# Patient Record
Sex: Female | Born: 1940 | Race: White | Hispanic: No | Smoking: Never smoker
Health system: Southern US, Community
[De-identification: ages and names within clinical notes are randomized; demographics above are authoritative.]

## PROBLEM LIST (undated history)

## (undated) DIAGNOSIS — E785 Hyperlipidemia, unspecified: Secondary | ICD-10-CM

## (undated) DIAGNOSIS — I4891 Unspecified atrial fibrillation: Secondary | ICD-10-CM

## (undated) DIAGNOSIS — I639 Cerebral infarction, unspecified: Secondary | ICD-10-CM

## (undated) DIAGNOSIS — E039 Hypothyroidism, unspecified: Secondary | ICD-10-CM

## (undated) DIAGNOSIS — J189 Pneumonia, unspecified organism: Secondary | ICD-10-CM

## (undated) DIAGNOSIS — K2970 Gastritis, unspecified, without bleeding: Secondary | ICD-10-CM

## (undated) DIAGNOSIS — M069 Rheumatoid arthritis, unspecified: Secondary | ICD-10-CM

## (undated) DIAGNOSIS — K317 Polyp of stomach and duodenum: Secondary | ICD-10-CM

## (undated) DIAGNOSIS — E079 Disorder of thyroid, unspecified: Secondary | ICD-10-CM

## (undated) DIAGNOSIS — F419 Anxiety disorder, unspecified: Secondary | ICD-10-CM

## (undated) DIAGNOSIS — F0390 Unspecified dementia without behavioral disturbance: Secondary | ICD-10-CM

## (undated) DIAGNOSIS — K219 Gastro-esophageal reflux disease without esophagitis: Secondary | ICD-10-CM

## (undated) DIAGNOSIS — K449 Diaphragmatic hernia without obstruction or gangrene: Secondary | ICD-10-CM

## (undated) DIAGNOSIS — I10 Essential (primary) hypertension: Secondary | ICD-10-CM

## (undated) DIAGNOSIS — M1990 Unspecified osteoarthritis, unspecified site: Secondary | ICD-10-CM

## (undated) DIAGNOSIS — K222 Esophageal obstruction: Secondary | ICD-10-CM

## (undated) HISTORY — PX: BLEPHAROPLASTY: SUR158

## (undated) HISTORY — DX: Diaphragmatic hernia without obstruction or gangrene: K44.9

## (undated) HISTORY — DX: Unspecified atrial fibrillation: I48.91

## (undated) HISTORY — DX: Gastritis, unspecified, without bleeding: K29.70

## (undated) HISTORY — PX: CATARACT EXTRACTION: SUR2

## (undated) HISTORY — PX: TUBAL LIGATION: SHX77

## (undated) HISTORY — PX: CHOLECYSTECTOMY: SHX55

## (undated) HISTORY — DX: Anxiety disorder, unspecified: F41.9

## (undated) HISTORY — DX: Unspecified osteoarthritis, unspecified site: M19.90

## (undated) HISTORY — DX: Gastro-esophageal reflux disease without esophagitis: K21.9

## (undated) HISTORY — DX: Polyp of stomach and duodenum: K31.7

## (undated) HISTORY — PX: APPENDECTOMY: SHX54

## (undated) HISTORY — DX: Disorder of thyroid, unspecified: E07.9

## (undated) HISTORY — DX: Hyperlipidemia, unspecified: E78.5

## (undated) HISTORY — DX: Rheumatoid arthritis, unspecified: M06.9

## (undated) HISTORY — DX: Unspecified dementia, unspecified severity, without behavioral disturbance, psychotic disturbance, mood disturbance, and anxiety: F03.90

## (undated) HISTORY — DX: Esophageal obstruction: K22.2

---

## 1999-03-05 ENCOUNTER — Other Ambulatory Visit: Admission: RE | Admit: 1999-03-05 | Discharge: 1999-03-05 | Payer: Self-pay

## 2000-03-16 ENCOUNTER — Other Ambulatory Visit: Admission: RE | Admit: 2000-03-16 | Discharge: 2000-03-16 | Payer: Self-pay | Admitting: Family Medicine

## 2001-05-16 ENCOUNTER — Other Ambulatory Visit: Admission: RE | Admit: 2001-05-16 | Discharge: 2001-05-16 | Payer: Self-pay | Admitting: Family Medicine

## 2003-01-08 ENCOUNTER — Other Ambulatory Visit: Admission: RE | Admit: 2003-01-08 | Discharge: 2003-01-08 | Payer: Self-pay | Admitting: *Deleted

## 2004-04-08 ENCOUNTER — Ambulatory Visit (HOSPITAL_COMMUNITY): Admission: RE | Admit: 2004-04-08 | Discharge: 2004-04-08 | Payer: Self-pay | Admitting: Gastroenterology

## 2004-06-10 ENCOUNTER — Ambulatory Visit: Payer: Self-pay | Admitting: Cardiology

## 2004-07-28 ENCOUNTER — Ambulatory Visit: Payer: Self-pay | Admitting: Gastroenterology

## 2004-07-28 ENCOUNTER — Ambulatory Visit (HOSPITAL_COMMUNITY): Admission: RE | Admit: 2004-07-28 | Discharge: 2004-07-28 | Payer: Self-pay | Admitting: Gastroenterology

## 2005-08-03 ENCOUNTER — Other Ambulatory Visit: Admission: RE | Admit: 2005-08-03 | Discharge: 2005-08-03 | Payer: Self-pay | Admitting: Family Medicine

## 2005-08-04 ENCOUNTER — Ambulatory Visit: Payer: Self-pay | Admitting: Cardiology

## 2005-10-04 ENCOUNTER — Encounter: Admission: RE | Admit: 2005-10-04 | Discharge: 2005-11-01 | Payer: Self-pay | Admitting: Rheumatology

## 2005-11-02 ENCOUNTER — Encounter: Admission: RE | Admit: 2005-11-02 | Discharge: 2005-12-03 | Payer: Self-pay | Admitting: Rheumatology

## 2006-04-13 ENCOUNTER — Encounter: Admission: RE | Admit: 2006-04-13 | Discharge: 2006-06-08 | Payer: Self-pay | Admitting: Rheumatology

## 2006-10-04 ENCOUNTER — Inpatient Hospital Stay (HOSPITAL_COMMUNITY): Admission: EM | Admit: 2006-10-04 | Discharge: 2006-10-05 | Payer: Self-pay | Admitting: Emergency Medicine

## 2006-10-04 ENCOUNTER — Ambulatory Visit: Payer: Self-pay | Admitting: Cardiology

## 2006-10-05 ENCOUNTER — Encounter: Payer: Self-pay | Admitting: Cardiology

## 2006-10-10 ENCOUNTER — Ambulatory Visit: Payer: Self-pay | Admitting: Cardiology

## 2006-11-09 ENCOUNTER — Ambulatory Visit: Payer: Self-pay | Admitting: Cardiology

## 2007-04-26 ENCOUNTER — Ambulatory Visit: Payer: Self-pay | Admitting: Cardiology

## 2007-07-25 ENCOUNTER — Ambulatory Visit: Payer: Self-pay | Admitting: Gastroenterology

## 2007-07-25 LAB — CONVERTED CEMR LAB
Basophils Absolute: 0.1 10*3/uL (ref 0.0–0.1)
Eosinophils Relative: 5.7 % — ABNORMAL HIGH (ref 0.0–5.0)
Ferritin: 18.6 ng/mL (ref 10.0–291.0)
MCHC: 36.6 g/dL (ref 30.0–36.0)
MCV: 84.8 fL (ref 78.0–100.0)
Neutrophils Relative %: 53.3 % (ref 43.0–77.0)
Platelets: 408 10*3/uL — ABNORMAL HIGH (ref 150–400)
RBC: 4.25 M/uL (ref 3.87–5.11)
RDW: 14 % (ref 11.5–14.6)

## 2007-07-26 ENCOUNTER — Ambulatory Visit: Payer: Self-pay | Admitting: Gastroenterology

## 2007-07-26 ENCOUNTER — Encounter: Payer: Self-pay | Admitting: Gastroenterology

## 2008-09-04 ENCOUNTER — Ambulatory Visit: Payer: Self-pay | Admitting: Cardiology

## 2008-12-02 ENCOUNTER — Telehealth: Payer: Self-pay | Admitting: Gastroenterology

## 2008-12-12 ENCOUNTER — Encounter: Payer: Self-pay | Admitting: Gastroenterology

## 2008-12-12 ENCOUNTER — Encounter: Payer: Self-pay | Admitting: Cardiology

## 2009-04-14 ENCOUNTER — Telehealth: Payer: Self-pay | Admitting: Gastroenterology

## 2009-04-17 ENCOUNTER — Ambulatory Visit: Payer: Self-pay | Admitting: Gastroenterology

## 2009-04-17 DIAGNOSIS — H409 Unspecified glaucoma: Secondary | ICD-10-CM | POA: Insufficient documentation

## 2009-04-17 DIAGNOSIS — K219 Gastro-esophageal reflux disease without esophagitis: Secondary | ICD-10-CM

## 2009-04-17 DIAGNOSIS — M069 Rheumatoid arthritis, unspecified: Secondary | ICD-10-CM | POA: Insufficient documentation

## 2009-04-17 DIAGNOSIS — R1319 Other dysphagia: Secondary | ICD-10-CM

## 2009-04-17 DIAGNOSIS — K2970 Gastritis, unspecified, without bleeding: Secondary | ICD-10-CM | POA: Insufficient documentation

## 2009-04-17 DIAGNOSIS — K222 Esophageal obstruction: Secondary | ICD-10-CM

## 2009-04-17 DIAGNOSIS — J45909 Unspecified asthma, uncomplicated: Secondary | ICD-10-CM | POA: Insufficient documentation

## 2009-04-17 DIAGNOSIS — K2990 Gastroduodenitis, unspecified, without bleeding: Secondary | ICD-10-CM

## 2009-04-23 ENCOUNTER — Ambulatory Visit: Payer: Self-pay | Admitting: Gastroenterology

## 2009-04-23 ENCOUNTER — Encounter: Payer: Self-pay | Admitting: Gastroenterology

## 2009-04-25 ENCOUNTER — Encounter: Payer: Self-pay | Admitting: Gastroenterology

## 2009-07-14 ENCOUNTER — Ambulatory Visit: Payer: Self-pay | Admitting: Cardiology

## 2009-07-14 ENCOUNTER — Inpatient Hospital Stay (HOSPITAL_COMMUNITY): Admission: EM | Admit: 2009-07-14 | Discharge: 2009-07-15 | Payer: Self-pay | Admitting: Emergency Medicine

## 2009-07-21 ENCOUNTER — Telehealth (INDEPENDENT_AMBULATORY_CARE_PROVIDER_SITE_OTHER): Payer: Self-pay | Admitting: *Deleted

## 2009-07-22 ENCOUNTER — Ambulatory Visit: Payer: Self-pay

## 2009-07-22 ENCOUNTER — Encounter (HOSPITAL_COMMUNITY): Admission: RE | Admit: 2009-07-22 | Discharge: 2009-09-29 | Payer: Self-pay | Admitting: Cardiology

## 2009-07-22 ENCOUNTER — Ambulatory Visit: Payer: Self-pay | Admitting: Cardiovascular Disease

## 2009-07-22 ENCOUNTER — Encounter: Payer: Self-pay | Admitting: Cardiology

## 2009-08-20 ENCOUNTER — Ambulatory Visit: Payer: Self-pay | Admitting: Cardiology

## 2009-08-20 DIAGNOSIS — I10 Essential (primary) hypertension: Secondary | ICD-10-CM | POA: Insufficient documentation

## 2009-08-20 DIAGNOSIS — R002 Palpitations: Secondary | ICD-10-CM

## 2009-08-20 DIAGNOSIS — Z8679 Personal history of other diseases of the circulatory system: Secondary | ICD-10-CM | POA: Insufficient documentation

## 2010-01-14 ENCOUNTER — Encounter (INDEPENDENT_AMBULATORY_CARE_PROVIDER_SITE_OTHER): Payer: Self-pay | Admitting: *Deleted

## 2010-01-21 ENCOUNTER — Ambulatory Visit: Payer: Self-pay | Admitting: Gastroenterology

## 2010-08-27 NOTE — Letter (Signed)
Summary: [facility] Instructions  [facility]  [address] [place], [place] [address]   Phone: [phone]  Fax: [phone]       Stacey Davenport    [birth year]    MRN: [id number]        Procedure Day Dorna Bloom:  [facility]  02/04/10     Arrival Time:  10:00AM     Procedure Time:  11:00AM     Location of Procedure:                    _X _  [facility] (4th Floor)                       PREPARATION FOR COLONOSCOPY WITH MOVIPREP   Starting 5 days prior to your procedure 01/30/10 do not eat nuts, seeds, popcorn, corn, beans, peas,  salads, or any raw vegetables.  Do not take any fiber supplements (e.g. Metamucil, Citrucel, and Benefiber).  THE DAY BEFORE YOUR PROCEDURE         DATE:  02/03/10  DAY: TUESDAY  1.  Drink clear liquids the entire day-NO SOLID FOOD  2.  Do not drink anything colored red or purple.  Avoid juices with pulp.  No orange juice.  3.  Drink at least 64 oz. (8 glasses) of fluid/clear liquids during the day to prevent dehydration and help the prep work efficiently.  CLEAR LIQUIDS INCLUDE: Water Jello Ice Popsicles Tea (sugar ok, no milk/cream) Powdered fruit flavored drinks Coffee (sugar ok, no milk/cream) Gatorade Juice: apple, white grape, white cranberry  Lemonade Clear bullion, consomm, broth Carbonated beverages (any kind) Strained chicken noodle soup Hard Candy                             4.  In the morning, mix first dose of MoviPrep solution:    Empty 1 Pouch A and 1 Pouch B into the disposable container    Add lukewarm drinking water to the top line of the container. Mix to dissolve    Refrigerate (mixed solution should be used within 24 hrs)  5.  Begin drinking the prep at 5:00 p.m. The MoviPrep container is divided by 4 marks.   Every 15 minutes drink the solution down to the next mark (approximately 8 oz) until the full liter is complete.   6.  Follow completed prep with 16 oz of clear liquid of your choice  (Nothing red or purple).  Continue to drink clear liquids until bedtime.  7.  Before going to bed, mix second dose of MoviPrep solution:    Empty 1 Pouch A and 1 Pouch B into the disposable container    Add lukewarm drinking water to the top line of the container. Mix to dissolve    Refrigerate  THE DAY OF YOUR PROCEDURE      DATE: 02/04/10   DAY: WEDNESDAY  Beginning at 6:00AM (5 hours before procedure):         1. Every 15 minutes, drink the solution down to the next mark (approx 8 oz) until the full liter is complete.  2. Follow completed prep with 16 oz. of clear liquid of your choice.    3. You may drink clear liquids until 9:00AM (2 HOURS BEFORE PROCEDURE).   MEDICATION INSTRUCTIONS  Unless otherwise instructed, you should take regular prescription medications with a small sip of water   as early as possible the  morning of your procedure.          OTHER INSTRUCTIONS  You will need a responsible adult at least 71 years of age to accompany you and drive you home.   This person must remain in the waiting room during your procedure.  Wear loose fitting clothing that is easily removed.  Leave jewelry and other valuables at home.  However, you may wish to bring a book to read or  an iPod/MP3 player to listen to music as you wait for your procedure to start.  Remove all body piercing jewelry and leave at home.  Total time from sign-in until discharge is approximately 2-3 hours.  You should go home directly after your procedure and rest.  You can resume normal activities the  day after your procedure.  The day of your procedure you should not:   Drive   Make legal decisions   Operate machinery   Drink alcohol   Return to work  You will receive specific instructions about eating, activities and medications before you leave.    The above instructions have been reviewed and explained to me by   Wyona Almas RN  January 21, 2010 8:59 AM     I fully  understand and can verbalize these instructions _____________________________ Date _________

## 2010-08-27 NOTE — Miscellaneous (Signed)
Summary: [facility] Previsit/prep  Clinical Lists Changes  Medications: Added new medication of MOVIPREP 100 GM  SOLR (PEG-KCL-NACL-NASULF-NA ASC-C) As per prep instructions. - Signed Rx of MOVIPREP 100 GM  SOLR (PEG-KCL-NACL-NASULF-NA ASC-C) As per prep instructions.;  #1 x 0;  Signed;  Entered by: Wyona Almas RN;  Authorized by: Mardella Layman MD [facility];  Method used: Electronically to [facility] [address] [place], [place], [place]  [address], Ph: [phone], Fax: [phone] Allergies: Added new allergy or adverse reaction of AVELOX    Prescriptions: MOVIPREP 100 GM  SOLR (PEG-KCL-NACL-NASULF-NA ASC-C) As per prep instructions.  #1 x 0   Entered by:   Wyona Almas RN   Authorized by:   Mardella Layman MD [facility]   Signed by:   Wyona Almas RN on 01/21/2010   Method used:   Electronically to        [facility] (retail)       [address]       [place], [place]  [address]       Ph: [phone]       Fax: [phone]   RxID:   [id number]

## 2010-08-27 NOTE — Assessment & Plan Note (Signed)
Summary: [facility]  Medications Added LISINOPRIL 5 MG TABS (LISINOPRIL) 1 by mouth daily CLARITIN 10 MG TABS (LORATADINE) 1 by mouth daily TRAMADOL HCL 50 MG TABS (TRAMADOL HCL) as needed      Allergies Added:   Visit Type:  Follow-up Referring Provider:  n/a Primary Provider:  Riki Sheer  CC:  chest pain.  History of Present Illness: The patient presents for followup after a hospitalization last month. She had chest pain but ruled out for myocardial infarction. As an outpatient she had a stress perfusion study demonstrating an EF of 81% with no evidence of ischemia or infarct. Since then she continues to get some left chest discomfort under her left breast though it's infrequent and less severe. She is not describing any new symptoms the Jewish PND or orthopnea. She does get fatigued and she gets short of breath with moderate activity but these are not new problems. She's not noticing any new palpitations and has had no presyncope or syncope.  Current Medications (verified): 1)  Levothyroxine Sodium 50 Mcg Tabs (Levothyroxine Sodium) .Marland Kitchen.. 1 By Mouth Once Daily 2)  Dexilant 60 Mg Cpdr (Dexlansoprazole) .Marland Kitchen.. 1 By Mouth Once Daily 3)  Cardizem Cd 180 Mg Xr24h-Cap (Diltiazem Hcl Coated Beads) .Marland Kitchen.. 1 By Mouth Two Times A Day 4)  Guaifenesin 200 Mg Tabs (Guaifenesin) .Marland Kitchen.. 1 By Mouth Two Times A Day As Needed 5)  Alvesco 160 Mcg/act Aers (Ciclesonide) .Marland Kitchen.. 1 By Mouth Two Times A Day 6)  Xopenex Hfa 45 Mcg/act Aero (Levalbuterol Tartrate) .... As Needed 7)  Prednisone 5 Mg Tabs (Prednisone) .Marland Kitchen.. 1 By Mouth Once Daily 8)  Aspirin 81 Mg Tbec (Aspirin) .Marland Kitchen.. 1 By Mouth Once Daily 9)  Singulair 10 Mg Tabs (Montelukast Sodium) .Marland Kitchen.. 1 By Mouth Once Daily 10)  Lorazepam 0.5 Mg Tabs (Lorazepam) .Marland Kitchen.. 1-2 By Mouth Once Daily As Needed 11)  Tylenol Arthritis Pain 650 Mg Cr-Tabs (Acetaminophen) .... As Needed 12)  Brimonidine Tartrate 0.2 % Soln (Brimonidine Tartrate) .... Three Times A Day  As Needed 13)  Xalatan 0.005 % Soln (Latanoprost) .Marland Kitchen.. 1 Gtt in Each Eye At Bedtime 14)  Klor-Con 10 10 Meq Cr-Tabs (Potassium Chloride) .Marland Kitchen.. 1 By Mouth Once Daily 15)  Calcium-Vitamin D 250-125 Mg-Unit Tabs (Calcium Carbonate-Vitamin D) .Marland Kitchen.. 1 By Mouth Once Daily 16)  One-A-Day Weight Smart Advance  Tabs (Multiple Vitamins-Minerals) .Marland Kitchen.. 1 By Mouth Once Daily 17)  Lisinopril 5 Mg Tabs (Lisinopril) .Marland Kitchen.. 1 By Mouth Daily 18)  Claritin 10 Mg Tabs (Loratadine) .Marland Kitchen.. 1 By Mouth Daily 19)  Tramadol Hcl 50 Mg Tabs (Tramadol Hcl) .... As Needed  Allergies (verified): 1)  ! Sulfa 2)  ! * Nexium 3)  ! Aciphex 4)  ! Hydrocodone 5)  ! Biaxin 6)  ! * Levoquin 7)  ! Vicodin 8)  ! Keflex 9)  ! * Latex  Past History:  Past Medical History: ESOPHAGEAL STRICTURE (ICD-530.3) GASTRITIS (ICD-535.50) GERD (ICD-530.81) OSTEOARTHRITIS (ICD-715.90) GLUCOMA (ICD-365.9) RHEUMATOID ARTHRITIS (ICD-714.0) ASTHMA (ICD-493.90)  Past Surgical History: Tubal Ligation Appendectomy Right eye lid surgery Cataract Extraction  both eyes Cholecystectomy  Review of Systems       As stated in the HPI and negative for all other systems.   Vital Signs:  Patient profile:   70 year old female Height:      65 inches Weight:      207 pounds BMI:     34.57 Pulse rate:   69 / minute Resp:     18 per minute  BP sitting:   142 / 94  (right arm)  Vitals Entered By: Marrion Coy, CNA (August 20, 2009 11:19 AM)  Physical Exam  General:  Well developed, well nourished, in no acute distress. Head:  normocephalic and atraumatic Eyes:  PERRLA/EOM intact; conjunctiva and lids normal. Mouth:  Teeth, gums and palate normal. Oral mucosa normal. Neck:  Neck supple, no JVD. No masses, thyromegaly or abnormal cervical nodes. Lungs:  Clear bilaterally to auscultation and percussion. Heart:  Non-displaced PMI, chest non-tender; regular rate and rhythm, S1, S2 without murmurs, rubs or gallops. Carotid upstroke normal, no  bruit. Normal abdominal aortic size, no bruits. Femorals normal pulses, no bruits. Pedals normal pulses. No edema, no varicosities. Abdomen:  Bowel sounds positive; abdomen soft and non-tender without masses, organomegaly, or hernias noted. No hepatosplenomegaly. Msk:  Back normal, normal gait. Muscle strength and tone normal. Extremities:  No clubbing or cyanosis. Neurologic:  Alert and oriented x 3. Skin:  Intact without lesions or rashes. Psych:  Normal affect.   Impression & Recommendations:  Problem # 1:  CHEST PAIN, HX OF (ICD-V12.50) The patient continues to have some chest discomfort but no evidence of a cardiac etiology. No further cardiac workup is suggested. She is due to see her gastroenterologist for colonoscopy and I suggested that she mention this discomfort to him as well. She should continue with primary risk reduction.  Problem # 2:  PALPITATIONS (ICD-785.1) The patient has had no symptomatic recurrence of this. This can be treated as needed.  Problem # 3:  ESSENTIAL HYPERTENSION, BENIGN (ICD-401.1) Her blood pressure is well treated with the addition of lisinopril. She can have her basic metabolic profile checked when she sees Dr. Christell Constant for her routine appointment next month.

## 2010-10-21 ENCOUNTER — Ambulatory Visit: Payer: Medicare Other | Attending: Family Medicine | Admitting: Physical Therapy

## 2010-10-21 DIAGNOSIS — M545 Low back pain, unspecified: Secondary | ICD-10-CM | POA: Insufficient documentation

## 2010-10-21 DIAGNOSIS — R5381 Other malaise: Secondary | ICD-10-CM | POA: Insufficient documentation

## 2010-10-21 DIAGNOSIS — IMO0001 Reserved for inherently not codable concepts without codable children: Secondary | ICD-10-CM | POA: Insufficient documentation

## 2010-10-21 DIAGNOSIS — R293 Abnormal posture: Secondary | ICD-10-CM | POA: Insufficient documentation

## 2010-10-21 DIAGNOSIS — M2569 Stiffness of other specified joint, not elsewhere classified: Secondary | ICD-10-CM | POA: Insufficient documentation

## 2010-10-21 DIAGNOSIS — M25559 Pain in unspecified hip: Secondary | ICD-10-CM | POA: Insufficient documentation

## 2010-10-26 LAB — COMPREHENSIVE METABOLIC PANEL
ALT: 13 U/L (ref 0–35)
AST: 22 U/L (ref 0–37)
Albumin: 3.4 g/dL — ABNORMAL LOW (ref 3.5–5.2)
Alkaline Phosphatase: 46 U/L (ref 39–117)
BUN: 5 mg/dL — ABNORMAL LOW (ref 6–23)
CO2: 23 mEq/L (ref 19–32)
Calcium: 8.8 mg/dL (ref 8.4–10.5)
Chloride: 113 mEq/L — ABNORMAL HIGH (ref 96–112)
Creatinine, Ser: 0.59 mg/dL (ref 0.4–1.2)
GFR calc Af Amer: >60 mL/min (ref >60)
GFR calc non Af Amer: >60 mL/min (ref >60)
Glucose, Bld: 76 mg/dL (ref 70–99)
Potassium: 3.2 mEq/L — ABNORMAL LOW (ref 3.5–5.1)
Sodium: 145 mEq/L (ref 135–145)
Total Bilirubin: 0.6 mg/dL (ref 0.3–1.2)
Total Protein: 5.7 g/dL — ABNORMAL LOW (ref 6.0–8.3)

## 2010-10-26 LAB — DIFFERENTIAL
Basophils Relative: 1 % (ref 0–1)
Eosinophils Absolute: 0.1 10*3/uL (ref 0.0–0.7)
Eosinophils Relative: 1 % (ref 0–5)
Lymphocytes Relative: 28 % (ref 12–46)
Lymphs Abs: 1.7 10*3/uL (ref 0.7–4.0)
Monocytes Absolute: 0.5 10*3/uL (ref 0.1–1.0)
Monocytes Relative: 9 % (ref 3–12)
Neutro Abs: 3.8 10*3/uL (ref 1.7–7.7)
Neutrophils Relative %: 62 % (ref 43–77)

## 2010-10-26 LAB — LIPID PANEL
Cholesterol: 176 mg/dL (ref 0–200)
HDL: 68 mg/dL (ref >39)
LDL Cholesterol: 96 mg/dL (ref 0–99)
Total CHOL/HDL Ratio: 2.6 RATIO
Triglycerides: 58 mg/dL (ref <150)
VLDL: 12 mg/dL (ref 0–40)

## 2010-10-26 LAB — BASIC METABOLIC PANEL
BUN: 8 mg/dL (ref 6–23)
CO2: 24 mEq/L (ref 19–32)
Chloride: 110 mEq/L (ref 96–112)
Creatinine, Ser: 0.66 mg/dL (ref 0.4–1.2)
GFR calc Af Amer: >60 mL/min (ref >60)
GFR calc non Af Amer: >60 mL/min (ref >60)
Potassium: 3.5 mEq/L (ref 3.5–5.1)

## 2010-10-26 LAB — CARDIAC PANEL(CRET KIN+CKTOT+MB+TROPI)
CK, MB: 2.5 ng/mL (ref 0.3–4.0)
CK, MB: 3 ng/mL (ref 0.3–4.0)
Relative Index: INVALID (ref 0.0–2.5)
Total CK: 64 U/L (ref 7–177)
Troponin I: 0.01 ng/mL (ref 0.00–0.06)

## 2010-10-26 LAB — TROPONIN I: Troponin I: 0.02 ng/mL (ref 0.00–0.06)

## 2010-10-26 LAB — URINALYSIS, ROUTINE W REFLEX MICROSCOPIC
Bilirubin Urine: NEGATIVE
Protein, ur: NEGATIVE mg/dL
Specific Gravity, Urine: 1.02 (ref 1.005–1.030)

## 2010-10-26 LAB — POCT CARDIAC MARKERS
CKMB, poc: 1.3 ng/mL (ref 1.0–8.0)
Troponin i, poc: <0.05 ng/mL (ref 0.00–0.09)

## 2010-10-26 LAB — CBC
HCT: 41.5 % (ref 36.0–46.0)
MCHC: 33.3 g/dL (ref 30.0–36.0)
Platelets: 202 10*3/uL (ref 150–400)
RBC: 4.64 MIL/uL (ref 3.87–5.11)
WBC: 6.1 10*3/uL (ref 4.0–10.5)

## 2010-10-26 LAB — PROTIME-INR
INR: 0.92 (ref 0.00–1.49)
Prothrombin Time: 12.3 seconds (ref 11.6–15.2)

## 2010-10-26 LAB — CK TOTAL AND CKMB (NOT AT ARMC): CK, MB: 2.9 ng/mL (ref 0.3–4.0)

## 2010-10-28 ENCOUNTER — Ambulatory Visit: Payer: Medicare Other | Attending: Family Medicine | Admitting: Physical Therapy

## 2010-10-28 DIAGNOSIS — M545 Low back pain, unspecified: Secondary | ICD-10-CM | POA: Insufficient documentation

## 2010-10-28 DIAGNOSIS — M25559 Pain in unspecified hip: Secondary | ICD-10-CM | POA: Insufficient documentation

## 2010-10-28 DIAGNOSIS — M2569 Stiffness of other specified joint, not elsewhere classified: Secondary | ICD-10-CM | POA: Insufficient documentation

## 2010-10-28 DIAGNOSIS — R5381 Other malaise: Secondary | ICD-10-CM | POA: Insufficient documentation

## 2010-10-28 DIAGNOSIS — R293 Abnormal posture: Secondary | ICD-10-CM | POA: Insufficient documentation

## 2010-10-28 DIAGNOSIS — IMO0001 Reserved for inherently not codable concepts without codable children: Secondary | ICD-10-CM | POA: Insufficient documentation

## 2010-11-04 ENCOUNTER — Ambulatory Visit: Payer: Medicare Other | Admitting: Physical Therapy

## 2010-11-05 ENCOUNTER — Ambulatory Visit: Payer: Medicare Other | Admitting: Physical Therapy

## 2010-12-08 NOTE — Assessment & Plan Note (Signed)
[facility]                         GASTROENTEROLOGY OFFICE NOTE   Stacey, Davenport                         MRN:          [id number]  DATE:07/25/2007                            DOB:          1940/11/21    [name] is a 70 year old white female retiree who is referred by Dr.  Kathi Der office for evaluation of epigastric abdominal pain.   Stacey Davenport has had a dull, aching constant sensation for the last month in  her epigastric area without radiation.  She has been on regular Protonix  for many years because of chronic GERD confirmed by endoscopy and  esophageal monometry.  She denies abuse of NSAID, alcohol, or  cigarettes.  She has had no nocturnal awakening and denies true reflux  symptoms, dysphagia, or any specific hepatobiliary complaints.  Recent  lab data showed a normal liver profile, and she had a CT scan of the  abdomen performed on July 06, 2007, which showed a small umbilical  hernia and sigmoid colon diverticulosis without evidence of  diverticulitis.  The patient's last colonoscopy and endoscopy were 4 to  5 years ago.   She has had mild anorexia, weight loss, and seemed to have epigastric  pain with eating.  She is having fairly regular bowel habits since going  off of lactose, and she has pretty much been on a full liquid diet.  She  says all of her problems began after she was treated with amoxicillin  for a sinus infection.  She denies melena or hematochezia, fever,  chills, skin rashes, joint pains, oral stomatitis, et Karie Soda.   PAST MEDICAL HISTORY:  Somewhat complex, and revolves around various  cardiac arrhythmias, asthmatic bronchitis, chronic thyroiditis,  degenerative arthritis, and she has previously had a tubal ligation and  appendectomy.  She additionally has rheumatoid arthritis and is followed  by a cardiologist for her palpitations.   MEDICATIONS:  1. L-thyroxine 50 mcg a day.  2. Protonix 40 mg a day.  3.  Cardizem 180 mg twice a day.  4. Asmanex 1 puff twice a day.  5. Prednisone 5 mg a day.  6. Aspirin 81 mg a day.  7. Singulair 10 mg a day.  8. Lorazepam 0.5 mg 1 to 2 a day.  9. Various eye drops.  10.Various vitamin preparations.   She, in the past, has had reactions to SULFA, AVELOX, NEXIUM, LEVAQUIN,  and CEPHALOSPORINS.   FAMILY HISTORY:  Remarkable for a brother with colonic polyposis.  Otherwise, noncontributory.   SOCIAL HISTORY:  She is divorced and lives by herself.  She has a  [employer].  She does not smoke or abuse ethanol.   REVIEW OF SYSTEMS:  Fairly noncontributory without any acute  cardiovascular, pulmonary, genitourinary, neurological, or psychiatric  problems.   EXAMINATION:  She is an elderly-appearing white female in no acute  distress, appearing her stated age.  She is 5 feet 6 inches and weighs 218 pounds.  Blood pressure is 128/90  and pulse was 80 and regular.  I could not appreciate stigmata of chronic liver disease, but she did  have numerous  areas of senile purpura on her extremities.  Her chest was clear and she appeared today to be in a regular rhythm  without murmurs, gallops, or rubs.  ABDOMEN:  Somewhat obese, but there was no organomegaly or masses.  She  had some tenderness in the epigastric area without rebound.  There was a  prominent umbilical hernia noted.  RECTAL:  Exam was deferred at this time.  Mental status was clear.   ASSESSMENT:  1. Recurrent epigastric abdominal pain despite daily Protonix use in a      patient who is on prednisone and aspirin - rule out NSAID induced      gastropathy.  2. Consider gallbladder disease despite negative CT scan.  3. History of rheumatoid arthritis on prednisone therapy.  4. History of vague cardiac palpitations.  5. Multiple drug allergies.  6. Asthmatic bronchitis, perhaps related to gastroesophageal reflux      disease.  7. Chronic thyroid dysfunction.   RECOMMENDATIONS:  1.  Increase Protonix to 40 mg twice a day and will treat with p.r.n.      Carafate suspension.  2. Outpatient endoscopy as soon as possible.  3. If endoscopy is negative, will proceed with upper abdominal      ultrasound followup.  4. The patient is due for followup colonoscopy once upper GI      symptomatology resolves.  5. Check CBC, sed rate, anemia profile.  6. Continue other medications per Dr. Christell Constant.     Vania Rea. Jarold Motto, MD, [facility], FAGA  Electronically Signed    DRP/MedQ  DD: 07/25/2007  DT: 07/25/2007  Job #: [id number]   cc:   Ernestina Penna, M.D.

## 2010-12-08 NOTE — Assessment & Plan Note (Signed)
[facility]                            CARDIOLOGY OFFICE NOTE   Stacey Davenport, Stacey Davenport                         MRN:          [id number]  DATE:04/26/2007                            DOB:          Feb 09, 1941    PRIMARY CARE PHYSICIAN:  Paulita Cradle, N.P.   REASON FOR PRESENTATION:  Evaluate patient for palpitations.   HISTORY OF PRESENT ILLNESS:  The patient returns for 68-month followup.  She says that sometime around June she had some palpitations.  She says  she felt like she had been doing too much that day and had some skipped  heart beats.  She felt exhausted.  She had no presyncope or syncope. She  has not had any since then of any note.  She has not had any sustained  tachy palpitations. She has not had any chest pain.  She has had no new  shortness of breath, though she has seasonal allergies and is currently  being treated with a steroid injection.   PAST MEDICAL HISTORY:  1. Asthma.  2. Rheumatoid arthritis.  3. Eyelid surgery.  4. Tubal ligation.  5. Appendectomy.  6. Cardiomegaly (by chest x-ray but no left ventricular dysfunction on      echocardiography earlier this year.  Her ejection fraction appeared      to be about 55%).  7. Hypothyroidism.   ALLERGIES:  SULFA, BIAXIN, HYDROCODONE, LEVAQUIN.   MEDICATIONS:  1. Folic acid.  2. Klor-Con potassium.  3. Alphagan.  4. Cardizem 180 mg b.i.d.  5. Protonix 40 mg b.i.d.  6. Claritin 10 mg daily.  7. Prednisone 5 mg daily.  8. Serevent.  9. Levothyroxine 50 mcg daily.  10.Singulair.  11.Asmanex.  12.Aspirin 81 mg daily.   REVIEW OF SYSTEMS:  As stated in the HPI, otherwise negative for other  systems.   PHYSICAL EXAMINATION:  GENERAL:  The patient is in no distress.  VITAL SIGNS: Blood pressure 140/80, heart rate 72 and regular, weight  221 pounds, body mass 34.  NECK:  No jugular venous distention at 45 degrees.  Carotid upstroke  brisk and symmetric.  No bruits or  thyromegaly.  LYMPHATICS:  No adenopathy.  LUNGS:  Clear to auscultation bilaterally.  BACK:  No costovertebral angle tenderness.  CHEST:  Unremarkable.  HEART:  PMI not displaced or sustained.  S1 and S2 within normal limits.  No S3, S4, clicks, rubs, murmurs.  ABDOMEN:  Obese.  Positive bowel sounds normal in frequency and pitch.  No bruits, rebound, guarding or midline pulsatile mass.  No  organomegaly.  SKIN:  No rash, no nodules.  EXTREMITIES:  2+ pulses, no edema.   EKG:  Sinus rhythm, rate 72, axis rightward, first degree borderline AV  block, poor anterior R wave progression.  No acute ST-T wave changes.   ASSESSMENT AND PLAN:  1. Palpitations.  The patient is not having frequent palpitations.      She otherwise has a normal heart.  No further cardiovascular      testing is suggested.  She will continue on Cardizem as listed.  2. Followup will  be back in this clinic as needed.     Rollene Rotunda, MD, [facility]  Electronically Signed    JH/MedQ  DD: 04/26/2007  DT: 04/26/2007  Job #: [id number]   cc:   Paulita Cradle, N.P.

## 2010-12-08 NOTE — Assessment & Plan Note (Signed)
[facility]                            CARDIOLOGY OFFICE NOTE   Stacey Davenport, Stacey Davenport                         MRN:          [id number]  DATE:09/04/2008                            DOB:          [birth year]    PRIMARY CARE PHYSICIAN:  Ernestina Penna, MD   REASON FOR PRESENTATION:  Evaluate the patient with palpitations and  dyspnea.   HISTORY OF PRESENT ILLNESS:  The patient returns for followup.  It has  been a little over a year since I last saw her.  She has some evidence  of diastolic dysfunction.  However, this has been well compensated.  She  has not had a lot of difficulty with dyspnea.  Since I last saw her, she  has had some more shortness of breath.  She has reflux and wonders if it  could be related to this.  She also has a history of asthma.  She is due  to see Dr. Stevphen Rochester for evaluation of this.  She is not describing  PND or orthopnea.  She is describing some dyspnea when she does routine  activities.  It has been slowly progressive.  She has had some lower  extremity swelling which has been more than before.  I do note that her  weight is actually down from previous.  She does not avoid salt.  She  has not noticed her blood pressure to be elevated.  She does have some  palpitations.  These happen sporadically.  They make her weak, but they  are tolerable.  She has not had any severe sustained episodes that she  has had in the past.  She has had no presyncope or syncope.  She does  not get any chest discomfort, neck or arm discomfort.   PAST MEDICAL HISTORY:  1. Asthma.  2. Rheumatoid arthritis.  3. Eyelid surgery.  4. Tubal ligation.  5. Appendectomy.  6. Cardiomegaly (by chest x-ray, but no left ventricular dysfunction      on echocardiography.  She has had some evidence of diastolic      dysfunction.  EF is 55%).  7. Hypothyroidism.   ALLERGIES:  SULFA, BIAXIN, HYDROCODONE, and LEVAQUIN.   MEDICATIONS:  1. Levothyroxine  50 mcg daily.  2. Cardizem 360 mg daily.  3. Guaifenesin.  4. Asmanex.  5. Prednisone.  6. Aspirin 81 mg daily.  7. Singulair.  8. Lorazepam.  9. Xalatan eye drops.  10.Folic acid.  11.Potassium.  12.Calcium.  13.Claritin.  14.Kapidex 60 mcg daily.   REVIEW OF SYSTEMS:  As stated in the HPI and otherwise negative for  other systems.   PHYSICAL EXAMINATION:  GENERAL:  The patient is in no distress.  VITAL SIGNS:  Blood pressure 142/84 and heart rate 80 and regular.  HEENT:  Eyelids unremarkable.  Pupils equal, round, and react to light.  Fundi not visualized.  Oral mucosa unremarkable.  NECK:  No jugular venous distension at 45 degrees.  Carotid upstroke  brisk and symmetric.  No bruits.  No thyromegaly.  LYMPHATICS:  No adenopathy.  LUNGS:  Clear to auscultation  bilaterally.  BACK:  No costovertebral angle tenderness.  CHEST:  Unremarkable.  HEART:  PMI not displaced or sustained.  S1 and S2 within normal limits.  No S3, no S4.  No clicks, no rubs, no murmurs.  ABDOMEN:  Obese, positive bowel sounds, normal in frequency and pitch.  No bruits, no rebound, no guarding.  No midline pulsatile mass.  No  hepatomegaly.  No splenomegaly.  SKIN:  No rashes, no nodules.  EXTREMITIES:  Pulses 2+ throughout.  No edema, no cyanosis, no clubbing.  NEUROLOGIC:  Oriented to person, place, and time.  Cranial nerves II-XII  grossly intact.  Motor grossly intact.   EKG sinus rhythm, right axis deviation, left atrial enlargement, poor  anterior R-wave progression, low voltage throughout, no acute ST-T wave  changes, diffuse nonspecific T-wave flattening.   ASSESSMENT AND PLAN:  1. Dyspnea.  This is probably multifactorial.  I do not strongly      suspect to cardiac etiology; however, I will check a BNP.  She is      going to see Dr. Stevphen Rochester.  If she has no pulmonary etiology      and continues to have symptoms or if she has an elevated BNP, I      would like to see her back.  We  did discuss using less salt.  2. Lower extremity edema.  I am going to give her Lasix 20 mg once a      day for the next 4 days.  She is going to increase her potassium to      20 mEq during that time.  She is going to avoid salt.  She is going      to keep her feet elevated.  We are going to manage this further      based on symptoms.  3. Palpitations.  These are still occurring, but not particularly      symptomatic.  If they get worse, she will let me know.  For now,      she will continue the meds as listed.  4. Obesity.  She understands the need to lose weight with diet and      exercise.  5. Reflux.  She is going to see Dr. Jarold Motto about this as this seems      to have gotten worse recently.  6. Followup.  I will see her back based on the results of the above.      I would like to see her in no longer than 12 months again, but      sooner if needed.     Rollene Rotunda, MD, [facility]  Electronically Signed    JH/MedQ  DD: 09/04/2008  DT: 09/05/2008  Job #: [id number]   cc:   Ernestina Penna, M.D.

## 2010-12-11 NOTE — Assessment & Plan Note (Signed)
[facility]                            CARDIOLOGY OFFICE NOTE   Stacey, Davenport                         MRN:          [id number]  DATE:11/09/2006                            DOB:          February 05, 1941    PRIMARY:  Ernestina Penna, M.D.   REASON FOR PRESENTATION:  Evaluate patient with recent hospitalization  for palpitations.   HISTORY OF PRESENT ILLNESS:  The patient was admitted overnight in March  for palpitations.  There were no significant dysrhythmias noted during  that admission.  She ruled out for myocardial infarction.  She had an  echocardiogram which demonstrated normal EF and no significant  abnormalities.  She did wear an event monitor after discharge.  She said  she transmitted a couple of strips.  I have not seen these.  However,  she did not have much in the way of palpitations.  In retrospect, she  thinks she was overly fatigued which may have led to her symptoms.   She has had a past history of atrial tachycardia.  She had been managed  with Cardizem.   Since discharge, she has had some fatigue.  She has had no presyncope or  syncope.  She has had no chest pain, neck or arm discomfort.   PAST MEDICAL HISTORY:  1. Asthma.  2. Rheumatoid arthritis.  3. Eyelid surgery.  4. Tubal ligation.  5. Appendectomy.  6. Cardiomegaly (though she has had a normal echocardiogram as      mentioned and also normal stress perfusion study in 2003).   Sulfa, Biaxin, hydrocodone/APAP, Levaquin.   MEDICATIONS:  1. Folic acid.  2. Klor-Con 10 mEq daily.  3. Calcium.  4. Alphagan.  5. Cardizem 180 mg b.i.d.  6. Protonix 40 mg b.i.d.  7. Claritin 10 mg daily.  8. Prednisone 5 mg daily.  9. Serevent.  10.Levothyroxine 50 mcg daily.  11.Singulair.  12.Asmanex.   REVIEW OF SYSTEMS:  As stated in the HPI and otherwise negative for  other systems.   PHYSICAL EXAMINATION:  GENERAL:  The patient is in no distress.  VITAL SIGNS:  Blood  pressure 126/72, heart rate 62 and regular, weight  219 pounds, body mass index 34.  HEENT:  Eyelids unremarkable.  Pupils equal, round, and reactive to  light.  Fundi not visualized.  NECK:  No jugular venous distention.  Waveform within normal limits.  Carotid upstroke brisk and symmetric.  No bruits, no thyromegaly.  LYMPHATICS:  No adenopathy.  LUNGS:  Clear to auscultation bilaterally.  BACK:  No costovertebral angle tenderness.  CHEST:  Unremarkable.  HEART:  PMI not displaced or sustained.  S1 and S2 and within normal  limits.  No S3, no S4, no clicks, no rubs, no murmurs.  ABDOMEN:  Flat, positive bowel sounds normal in frequency and pitch.  No  bruits, no rebound, no guarding.  No midline pulsatile mass.  No  organomegaly.  SKIN:  No rashes, no nodules.  EXTREMITIES:  Show 2+ pulses, no edema.   EKG:  Sinus rhythm, rate 68, axis within normal limits, interval within  normal  limits, no acute ST-T wave changes, poor anterior R wave  progression.   ASSESSMENT AND PLAN:  1. Palpitations.  These are not particularly symptomatic.  I will try      to find the strips from her event monitor. However, we are going to      manage these symptomatically and will continue the list of      medicines as described.  Of note, she did have a normal TSH in the      hospital.  2. Follow up will be in about 6 months or sooner if needed.     Rollene Rotunda, MD, [facility]  Electronically Signed    JH/MedQ  DD: 11/09/2006  DT: 11/09/2006  Job #: [id number]   cc:   Ernestina Penna, M.D.

## 2010-12-11 NOTE — Discharge Summary (Signed)
NAME:  SHELSIE, TIJERINO NO.:  [id number]   MEDICAL RECORD NO.:  [id number]          PATIENT TYPE:  INP   LOCATION:  3715                         FACILITY:  [facility]   PHYSICIAN:  Rollene Rotunda, MD, FACCDATE OF BIRTH:  05/25/1941   DATE OF ADMISSION:  10/04/2006  DATE OF DISCHARGE:  10/05/2006                               DISCHARGE SUMMARY   PRIMARY CARDIOLOGIST:  Dr. Antoine Poche in [facility]   PRIMARY CARE Lashann Hagg:  Dr. Rudi Heap in [place]   PRINCIPAL DIAGNOSIS:  Palpitations.   SECONDARY DIAGNOSES:  1. Asthma.  2. Rheumatoid arthritis.  3. Glaucoma.  4. Hypothyroidism.  5. GERD.  6. Status post tubal ligation.  7. History of eye surgery.  8. Hypertension.   ALLERGIES:  SULFA, BIAXIN, HYDROCODONE AND LEVAQUIN.   PROCEDURE:  A 2-D echocardiogram.   HISTORY OF PRESENT ILLNESS:  This is a 70 year old Caucasian female with  prior history of tachy palpitations evaluated by Dr. Sherryl Manges in  1999 with diagnosis of atrial tachycardia and PACs generally well  managed with oral Cardizem therapy.  She was in her usual state of  health until March 11 when while standing in her kitchen had sudden  onset of tachy palpitations with nausea, lightheadedness and shortness  of breath lasting approximately 15 minutes and resolving spontaneously.  She then went on to have an episode of diarrhea and presented to see Dr.  Christell Constant.  She was noted in the office to have an elevated blood pressure  of 178 systolic and decision was made to transfer her to [facility] for  further evaluation.   HOSPITAL COURSE:  Ms. Reffitt was ruled out for MI.  She has had no  ectopy on the monitor.  A 2-D echocardiogram was performed this morning  and has revealed normal LV function with an EF of 55% without  hypertrophy or significant valvular abnormalities.  There was a small  pericardial effusion.  At the time of this dictation, D-dimer is pending  and provided that it is negative we would  plan on discharge this evening  in satisfactory condition.  We have arranged for her to follow up with  Dr. Antoine Poche on May 16 at 1:15 p.m.  She will also be contacted by our  office to arrange the pickup of an Event recorder to further evaluate  her tachy palpitations.   DISCHARGE LABS:  Hemoglobin 14.6, hematocrit 43.0, WBC 7.7, platelets  240, PT 13.0, INR 1.0, PTT 31, sodium 144, potassium 5.1, chloride 114,  CO2 27, BUN 7, creatinine 0.81, glucose 102, calcium 9.4, magnesium 2.2,  CK 60, MB 1.4, troponin I 0.02, total cholesterol 212, triglycerides 40,  HDL 76, LDL 128, free T4 1.10, TSH 1.905.   DISPOSITION:  Patient is being discharged home today in good condition.   FOLLOWUP PLANS AND APPOINTMENTS:  Patient will follow up with Dr.  Antoine Poche on February 16 at 1:15 p.m.  She was asked to follow up with  her primary care physician, Dr. Christell Constant, as previously scheduled.  We will  arrange for Event recorder to further evaluate tachy palpitations and  she will be contacted by our office to arrange pickup of this device.   DISCHARGE MEDICATIONS:  1. Folic acid 1 mg q.d.  2. Klor-Con 10 mEq q.d.  3. Alphagan eye drops as previously prescribed.  4. Cardizem 180 mg b.i.d.  5. Protonix 40 mg b.i.d.  6. Claritin 10 mg q.d.  7. Prednisone 5 mg q.d.  8. Xopenex MDI two puffs q.4.h. p.r.n.  9. Serevent Diskus 50 mcg q.12.h.  10.Levothyroxine 50 mcg q.d.  11.Singulair 10 mg q.d.  12.HCTZ 12.5 mg q.d.   Of note, the patient's theophylline was discontinued and she is asked to  follow up with Dr. Christell Constant regarding other options.  Her albuterol was  switched to Xopenex secondary to concern for both theophylline and  albuterol contributing to her palpitations.   OUTSTANDING LABS OR STUDIES:  D-dimer is pending at the time of  dictation.   DURATION OF DISCHARGE ENCOUNTER:  Forty five minutes including physician  time.      Nicolasa Ducking, ANP      Rollene Rotunda, MD, [facility]   Electronically Signed    CB/MEDQ  D:  10/05/2006  T:  10/06/2006  Job:  [id number]   cc:   Ernestina Penna, M.D.

## 2010-12-11 NOTE — H&P (Signed)
NAME:  Stacey Davenport, KNISELY NO.:  [id number]   MEDICAL RECORD NO.:  [id number]          PATIENT TYPE:  INP   LOCATION:  3715                         FACILITY:  [facility]   PHYSICIAN:  Bettey Mare. Lawrence, NPDATE OF BIRTH:  Apr 02, 1941   DATE OF ADMISSION:  10/04/2006  DATE OF DISCHARGE:  10/05/2006                              HISTORY & PHYSICAL   DICTATED FOR:  Dr. Juanito Doom.   PRIMARY CARDIOLOGIST:  Rollene Rotunda, M.D.   PRIMARY CARE PHYSICIAN:  Rudi Heap, M.D.   HISTORY OF PRESENT ILLNESS:  This is a very pleasant 70 year old obese  Caucasian female who presented to the emergency room after complaints of  irregular heart rate.  The patient has no known cardiac history, with  the exception of palpitations in 2002, for which she was placed on  Cardizem.  The patient was in her kitchen sifting flour to make a cake  when she had a sudden onset of irregular heart rate.  The patient had  nausea and had to go to the bathroom very quickly thereafter and did not  throw up but had diarrhea.  The patient also complained of some near  syncope.  She called her primary care physician, Dr. Christell Constant, who advised  her to come to his office right away.  After being seen in Dr. Kathi Der  office, he noticed that her blood pressure was elevated at 178 systolic  and that her oxygen saturation was lower than normal.  As a result of  this, the patient was placed on oxygen and also given sublingual  nitroglycerin and brought to the emergency room for further evaluation.   The patient is now feeling better.  She still has some mild nausea.  She  continues on oxygen.  Has had no further complaints of irregular heart  rhythm or near syncope.  She states that she has noticed over the last  few weeks that her blood pressure has been mildly elevated, and she had  been complaining of diarrhea approximately one week ago.   The patient was last seen by Dr. Rollene Rotunda in January of 2007.  The  patient was first evaluated for palpitations in 2002, and this was found  that the palpitations were correlating with periods when her TSH was  depressed.  At that time, the patient was placed on Cardizem 180 mg  every 12 hours and has been taking it without fail since that time.   PAST MEDICAL HISTORY:  Asthma, rheumatoid arthritis, glaucoma,  hypothyroidism, GERD, and palpitations.   PAST CARDIAC WORKUPS:  The patient did have a Cardiolite stress test  with dobutamine, which was negative in 2003.   PAST SURGICAL HISTORY:  Tubal ligation and eye surgery secondary to  glaucoma.   SOCIAL HISTORY:  The patient lives in [place] in a senior apartment  complex.  She lives alone but is monitored through that facility.  She  is retired.  She is separated from her husband.  She has four children.  She does not smoke; has never smoked in the past.  She does not drink  alcohol, does  not use illicit drugs.  She is not on an exercise program.   FAMILY HISTORY:  Mother died at age 16 with diabetes.  Father died at  age 53 from lymphoma.  She has two brothers with arthritis and one  sister who had a myocardial infarction.   CURRENT MEDICATIONS:  1. Folic acid 1 mg daily.  2. Klor-Con 10 mEq daily.  3. Alphagan eye drops daily.  4. Cardizem 180 mg b.i.d.  5. Protonix 40 mg b.i.d.  6. Theophylline 150 mg once a day.  7. Claritin 10 mg once a day.  8. Prednisone 5 mg once a day.  9. Albuterol inhaler as needed.  10.Serevent disk once a day.  11.Levothyroxine 550 mcg once a day.  12.Singulair 10 mg once a day.   ALLERGIES:  Sulfa, Biaxin, hydrocodone, APAP, and Levaquin.   CURRENT LABS:  Sodium 139, potassium 4.1, chloride 109, CO2 of 26.3, BUN  10, creatinine 0.8, glucose 137, hemoglobin 14.6, hematocrit 43.0, white  blood cells 7.7, platelets 240.  EKG reveals sinus rhythm with a rate of  73 beats per minute with a left axis deviation.  Chest x-ray reveals  cardiomegaly with bronchial  thickening and interstitial prominence with  no acute CHF or pneumonia.   PHYSICAL EXAMINATION:  VITAL SIGNS:  Blood pressure 143/72, pulse 69,  respirations 20, temperature 97.3, O2 saturation 100% on 2 liters.  HEENT:  Head is normocephalic and atraumatic.  Eyes:  PERRLA.  Mucous  membranes and mouth are pink and moist.  Tongue is midline.  Neck is  supple, obese with no JVD and no carotid bruits appreciated.  CARDIOVASCULAR:  Regular rate and rhythm with soft 1/6 systolic murmur  auscultated without rubs or gallops.  LUNGS:  Clear to auscultation.  ABDOMEN:  Obese and nontender with 2+ bowel sounds.  EXTREMITIES:  Without clubbing, cyanosis, or edema.  Radial pulses 1+  bilaterally.  Dorsalis pedis pulses are 1+ bilaterally.  SKIN:  Warm and dry.  NEUROLOGIC:  Intact.   IMPRESSION:  1. History of irregular heart rate transiently now in normal sinus      rhythm.  2. History of asthma.  3. Hypothyroidism.   PLAN:  The patient was seen by myself and Dr. Valera Castle in the  emergency room.  The patient will be admitted to rule out myocardial  ischemia.  The patient did have a normal dobutamine Cardiolite stress  test in 2003.  The patient will also have an echocardiogram completed to  evaluate cardiomegaly and PA pressures.  The patient will also have a  TSH checked and be restarted on her current medication with the  exception of theophylline and albuterol inhaler.  We will start her on  Xopenex inhalers on an as needed basis.   The patient will be followed throughout hospitalization.  Dr. Antoine Poche  will be notified of her admission.  This has been discussed with the  patient who verbalizes understanding and is willing to be admitted.  We  will make further recommendations throughout hospital course depending  upon results of testing.      Bettey Mare. Lyman Bishop, NP     KML/MEDQ  D:  10/04/2006  T:  10/06/2006  Job:  [id number]  cc:   Ernestina Penna, M.D.

## 2010-12-11 NOTE — Op Note (Signed)
NAME:  Stacey Davenport, Stacey Davenport NO.:  [id number]   MEDICAL RECORD NO.:  [id number]          PATIENT TYPE:  AMB   LOCATION:  ENDO                         FACILITY:  [facility]   PHYSICIAN:  Vania Rea. Jarold Motto, M.D. [facility] OF BIRTH:  02-22-1941   DATE OF PROCEDURE:  07/28/2004  DATE OF DISCHARGE:  07/28/2004                                 OPERATIVE REPORT   PROCEDURE:  Esophageal manometry.   Esophageal manometry was completed on July 28, 2004.  Results are as  follows:   1.  Upper esophageal sphincter:  There was normal coordination between      pharyngeal contraction and cricopharyngeal relaxation.  2.  Lower esophageal sphincter:  Mean pressure is decreased to approximately      9 mmHg with normal relaxation with swallowing.  3.  Motility pattern:  There appears to be normal peristalsis in the      esophagus.  However, mean amplitude of contraction is only 20 mmHg.   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  There is markedly decreased esophageal peristaltic contraction amplitude,  but peristalsis appears normal.  This reading is consistent with someone  with chronic acid reflux and impaired esophageal clearance.       DRP/MEDQ  D:  08/03/2004  T:  08/03/2004  Job:  [id number]

## 2011-02-26 ENCOUNTER — Encounter: Payer: Self-pay | Admitting: Cardiology

## 2011-04-19 ENCOUNTER — Encounter: Payer: Self-pay | Admitting: Cardiology

## 2011-09-30 ENCOUNTER — Other Ambulatory Visit: Payer: Self-pay

## 2011-09-30 ENCOUNTER — Inpatient Hospital Stay (HOSPITAL_COMMUNITY): Payer: Medicare Other

## 2011-09-30 ENCOUNTER — Inpatient Hospital Stay (HOSPITAL_COMMUNITY)
Admission: AD | Admit: 2011-09-30 | Discharge: 2011-10-04 | DRG: 064 | Disposition: A | Discharge status: Skilled Nursing Facility | Payer: Medicare Other | Source: Ambulatory Visit | Attending: Family Medicine | Admitting: Family Medicine

## 2011-09-30 ENCOUNTER — Encounter (HOSPITAL_COMMUNITY): Payer: Self-pay | Admitting: General Practice

## 2011-09-30 DIAGNOSIS — R0602 Shortness of breath: Secondary | ICD-10-CM | POA: Diagnosis present

## 2011-09-30 DIAGNOSIS — K2990 Gastroduodenitis, unspecified, without bleeding: Secondary | ICD-10-CM

## 2011-09-30 DIAGNOSIS — H409 Unspecified glaucoma: Secondary | ICD-10-CM

## 2011-09-30 DIAGNOSIS — J45909 Unspecified asthma, uncomplicated: Secondary | ICD-10-CM | POA: Diagnosis present

## 2011-09-30 DIAGNOSIS — E039 Hypothyroidism, unspecified: Secondary | ICD-10-CM | POA: Diagnosis present

## 2011-09-30 DIAGNOSIS — J189 Pneumonia, unspecified organism: Secondary | ICD-10-CM

## 2011-09-30 DIAGNOSIS — M069 Rheumatoid arthritis, unspecified: Secondary | ICD-10-CM | POA: Diagnosis present

## 2011-09-30 DIAGNOSIS — R1319 Other dysphagia: Secondary | ICD-10-CM

## 2011-09-30 DIAGNOSIS — R002 Palpitations: Secondary | ICD-10-CM

## 2011-09-30 DIAGNOSIS — I10 Essential (primary) hypertension: Secondary | ICD-10-CM | POA: Diagnosis present

## 2011-09-30 DIAGNOSIS — K219 Gastro-esophageal reflux disease without esophagitis: Secondary | ICD-10-CM | POA: Diagnosis present

## 2011-09-30 DIAGNOSIS — M1990 Unspecified osteoarthritis, unspecified site: Secondary | ICD-10-CM | POA: Diagnosis present

## 2011-09-30 DIAGNOSIS — I509 Heart failure, unspecified: Secondary | ICD-10-CM | POA: Diagnosis present

## 2011-09-30 DIAGNOSIS — R4789 Other speech disturbances: Secondary | ICD-10-CM | POA: Diagnosis present

## 2011-09-30 DIAGNOSIS — R29810 Facial weakness: Secondary | ICD-10-CM | POA: Diagnosis present

## 2011-09-30 DIAGNOSIS — Z79899 Other long term (current) drug therapy: Secondary | ICD-10-CM

## 2011-09-30 DIAGNOSIS — I6340 Cerebral infarction due to embolism of unspecified cerebral artery: Principal | ICD-10-CM | POA: Diagnosis present

## 2011-09-30 DIAGNOSIS — R7309 Other abnormal glucose: Secondary | ICD-10-CM | POA: Diagnosis present

## 2011-09-30 DIAGNOSIS — I499 Cardiac arrhythmia, unspecified: Secondary | ICD-10-CM | POA: Diagnosis present

## 2011-09-30 DIAGNOSIS — Z7982 Long term (current) use of aspirin: Secondary | ICD-10-CM

## 2011-09-30 DIAGNOSIS — K222 Esophageal obstruction: Secondary | ICD-10-CM

## 2011-09-30 DIAGNOSIS — Z8679 Personal history of other diseases of the circulatory system: Secondary | ICD-10-CM

## 2011-09-30 DIAGNOSIS — IMO0002 Reserved for concepts with insufficient information to code with codable children: Secondary | ICD-10-CM

## 2011-09-30 DIAGNOSIS — I4891 Unspecified atrial fibrillation: Secondary | ICD-10-CM

## 2011-09-30 HISTORY — DX: Essential (primary) hypertension: I10

## 2011-09-30 HISTORY — DX: Cerebral infarction, unspecified: I63.9

## 2011-09-30 HISTORY — DX: Hypothyroidism, unspecified: E03.9

## 2011-09-30 HISTORY — DX: Pneumonia, unspecified organism: J18.9

## 2011-09-30 LAB — COMPREHENSIVE METABOLIC PANEL
ALT: 17 U/L (ref 0–35)
Albumin: 3.1 g/dL — ABNORMAL LOW (ref 3.5–5.2)
Alkaline Phosphatase: 106 U/L (ref 39–117)
BUN: 16 mg/dL (ref 6–23)
Chloride: 106 mEq/L (ref 96–112)
Glucose, Bld: 101 mg/dL — ABNORMAL HIGH (ref 70–99)
Potassium: 4.4 mEq/L (ref 3.5–5.1)
Sodium: 139 mEq/L (ref 135–145)
Total Bilirubin: 0.5 mg/dL (ref 0.3–1.2)
Total Protein: 6.4 g/dL (ref 6.0–8.3)

## 2011-09-30 LAB — BLOOD GAS, ARTERIAL
Bicarbonate: 18.8 mEq/L — ABNORMAL LOW (ref 20.0–24.0)
O2 Saturation: 94 %
Patient temperature: 98.6
TCO2: 19.6 mmol/L (ref 0–100)
pH, Arterial: 7.467 — ABNORMAL HIGH (ref 7.350–7.400)

## 2011-09-30 LAB — CARDIAC PANEL(CRET KIN+CKTOT+MB+TROPI)
CK, MB: 15.7 ng/mL (ref 0.3–4.0)
CK, MB: 14.6 ng/mL (ref 0.3–4.0)
Relative Index: 1.3 (ref 0.0–2.5)
Total CK: 1004 U/L — ABNORMAL HIGH (ref 7–177)
Troponin I: <0.3 ng/mL (ref <0.30)
Troponin I: <0.3 ng/mL (ref <0.30)

## 2011-09-30 LAB — CBC
HCT: 39.4 % (ref 36.0–46.0)
Hemoglobin: 12.8 g/dL (ref 12.0–15.0)
MCH: 27.4 pg (ref 26.0–34.0)
MCHC: 32.5 g/dL (ref 30.0–36.0)
MCV: 84.2 fL (ref 78.0–100.0)
RBC: 4.68 MIL/uL (ref 3.87–5.11)

## 2011-09-30 LAB — URINALYSIS, MICROSCOPIC ONLY
Leukocytes, UA: NEGATIVE
Nitrite: NEGATIVE
Protein, ur: 30 mg/dL — AB
Specific Gravity, Urine: 1.042 — ABNORMAL HIGH (ref 1.005–1.030)
Urobilinogen, UA: 0.2 mg/dL (ref 0.0–1.0)

## 2011-09-30 LAB — MAGNESIUM: Magnesium: 1.9 mg/dL (ref 1.5–2.5)

## 2011-09-30 LAB — LIPID PANEL
HDL: 56 mg/dL (ref >39)
LDL Cholesterol: 31 mg/dL (ref 0–99)
Total CHOL/HDL Ratio: 1.8 RATIO
Triglycerides: 82 mg/dL (ref <150)
VLDL: 16 mg/dL (ref 0–40)

## 2011-09-30 LAB — HEMOGLOBIN A1C: Mean Plasma Glucose: 120 mg/dL — ABNORMAL HIGH (ref <117)

## 2011-09-30 MED ORDER — CEFTRIAXONE SODIUM 1 G IJ SOLR
1.0000 g | INTRAMUSCULAR | Status: DC
  Filled 2011-09-30: qty 10

## 2011-09-30 MED ORDER — LATANOPROST 0.005 % OP SOLN
1.0000 [drp] | Freq: Every day | OPHTHALMIC | Status: DC
  Administered 2011-09-30 – 2011-10-03 (×4): 1 [drp] via OPHTHALMIC
  Filled 2011-09-30 (×2): qty 2.5

## 2011-09-30 MED ORDER — DILTIAZEM HCL ER COATED BEADS 180 MG PO CP24
180.0000 mg | ORAL_CAPSULE | Freq: Two times a day (BID) | ORAL | Status: DC
Start: 2011-09-30 — End: 2011-09-30
  Filled 2011-09-30: qty 1

## 2011-09-30 MED ORDER — ALBUTEROL SULFATE (5 MG/ML) 0.5% IN NEBU
2.5000 mg | INHALATION_SOLUTION | Freq: Four times a day (QID) | RESPIRATORY_TRACT | Status: DC
  Administered 2011-09-30 – 2011-10-01 (×3): 2.5 mg via RESPIRATORY_TRACT
  Filled 2011-09-30 (×3): qty 0.5

## 2011-09-30 MED ORDER — BRIMONIDINE TARTRATE 0.2 % OP SOLN
1.0000 [drp] | Freq: Three times a day (TID) | OPHTHALMIC | Status: DC
  Administered 2011-09-30 – 2011-10-04 (×11): 1 [drp] via OPHTHALMIC
  Filled 2011-09-30 (×3): qty 5

## 2011-09-30 MED ORDER — IOHEXOL 350 MG/ML SOLN
170.0000 mL | Freq: Once | INTRAVENOUS | Status: AC | PRN
  Administered 2011-09-30: 170 mL via INTRAVENOUS

## 2011-09-30 MED ORDER — CICLESONIDE 160 MCG/ACT IN AERS: 1.0000 | INHALATION_SPRAY | Freq: Two times a day (BID) | RESPIRATORY_TRACT | Status: DC

## 2011-09-30 MED ORDER — ASPIRIN 81 MG PO TBEC
81.0000 mg | DELAYED_RELEASE_TABLET | Freq: Every day | ORAL | Status: DC
  Administered 2011-10-01 – 2011-10-02 (×2): 81 mg via ORAL
  Filled 2011-09-30 (×4): qty 1

## 2011-09-30 MED ORDER — MONTELUKAST SODIUM 10 MG PO TABS
10.0000 mg | ORAL_TABLET | Freq: Every day | ORAL | Status: DC
  Administered 2011-10-01 – 2011-10-03 (×3): 10 mg via ORAL
  Filled 2011-09-30 (×5): qty 1

## 2011-09-30 MED ORDER — SODIUM CHLORIDE 0.9 % IJ SOLN
3.0000 mL | Freq: Two times a day (BID) | INTRAMUSCULAR | Status: DC
  Administered 2011-10-01 – 2011-10-03 (×5): 3 mL via INTRAVENOUS

## 2011-09-30 MED ORDER — ONDANSETRON HCL 4 MG PO TABS: 4.0000 mg | ORAL_TABLET | Freq: Four times a day (QID) | ORAL | Status: DC | PRN

## 2011-09-30 MED ORDER — METHYLPREDNISOLONE SODIUM SUCC 125 MG IJ SOLR: 80.0000 mg | Freq: Two times a day (BID) | INTRAMUSCULAR | Status: DC

## 2011-09-30 MED ORDER — LEVOTHYROXINE SODIUM 50 MCG PO TABS
50.0000 ug | ORAL_TABLET | Freq: Every day | ORAL | Status: DC
  Administered 2011-10-01 – 2011-10-04 (×4): 50 ug via ORAL
  Filled 2011-09-30 (×7): qty 1

## 2011-09-30 MED ORDER — DEXTROSE 5 % IV SOLN
500.0000 mg | INTRAVENOUS | Status: DC
  Administered 2011-10-01 – 2011-10-02 (×2): 500 mg via INTRAVENOUS
  Filled 2011-09-30 (×4): qty 500

## 2011-09-30 MED ORDER — FLUTICASONE PROPIONATE HFA 110 MCG/ACT IN AERO
1.0000 | INHALATION_SPRAY | Freq: Two times a day (BID) | RESPIRATORY_TRACT | Status: DC
  Filled 2011-09-30: qty 12

## 2011-09-30 MED ORDER — SODIUM CHLORIDE 0.9 % IJ SOLN: 3.0000 mL | INTRAMUSCULAR | Status: DC | PRN

## 2011-09-30 MED ORDER — LORATADINE 10 MG PO TABS: 10.0000 mg | ORAL_TABLET | Freq: Every day | ORAL | Status: DC

## 2011-09-30 MED ORDER — SODIUM CHLORIDE 0.9 % IV SOLN
250.0000 mL | INTRAVENOUS | Status: DC | PRN
  Administered 2011-09-30: 20:00:00 via INTRAVENOUS

## 2011-09-30 MED ORDER — VANCOMYCIN HCL 1000 MG IV SOLR
750.0000 mg | Freq: Two times a day (BID) | INTRAVENOUS | Status: DC
  Administered 2011-09-30 – 2011-10-01 (×2): 750 mg via INTRAVENOUS
  Filled 2011-09-30 (×3): qty 750

## 2011-09-30 MED ORDER — ENOXAPARIN SODIUM 30 MG/0.3ML ~~LOC~~ SOLN
30.0000 mg | SUBCUTANEOUS | Status: DC
  Administered 2011-09-30: 30 mg via SUBCUTANEOUS
  Filled 2011-09-30 (×3): qty 0.3

## 2011-09-30 MED ORDER — CALCIUM CARBONATE-VITAMIN D 500-200 MG-UNIT PO TABS
1.0000 | ORAL_TABLET | Freq: Every day | ORAL | Status: DC
  Administered 2011-10-01 – 2011-10-04 (×4): 1 via ORAL
  Filled 2011-09-30 (×6): qty 1

## 2011-09-30 MED ORDER — TRAMADOL HCL 50 MG PO TABS
50.0000 mg | ORAL_TABLET | Freq: Four times a day (QID) | ORAL | Status: DC | PRN
  Filled 2011-09-30: qty 1

## 2011-09-30 MED ORDER — CALCIUM-VITAMIN D 250-125 MG-UNIT PO TABS: 1.0000 | ORAL_TABLET | Freq: Every day | ORAL | Status: DC

## 2011-09-30 MED ORDER — GUAIFENESIN 200 MG PO TABS
200.0000 mg | ORAL_TABLET | Freq: Two times a day (BID) | ORAL | Status: DC | PRN
  Administered 2011-09-30: 200 mg via ORAL
  Filled 2011-09-30: qty 1

## 2011-09-30 MED ORDER — LEVALBUTEROL TARTRATE 45 MCG/ACT IN AERO
2.0000 | INHALATION_SPRAY | Freq: Two times a day (BID) | RESPIRATORY_TRACT | Status: DC
  Filled 2011-09-30: qty 15

## 2011-09-30 MED ORDER — LORAZEPAM 0.5 MG PO TABS
0.5000 mg | ORAL_TABLET | Freq: Every day | ORAL | Status: DC | PRN
  Administered 2011-10-01 – 2011-10-03 (×3): 0.5 mg via ORAL
  Filled 2011-09-30 (×3): qty 1

## 2011-09-30 MED ORDER — METHYLPREDNISOLONE SODIUM SUCC 125 MG IJ SOLR
80.0000 mg | Freq: Three times a day (TID) | INTRAMUSCULAR | Status: DC
  Administered 2011-09-30 – 2011-10-01 (×2): 80 mg via INTRAVENOUS
  Filled 2011-09-30: qty 1.28
  Filled 2011-09-30: qty 2
  Filled 2011-09-30 (×3): qty 1.28

## 2011-09-30 MED ORDER — PANTOPRAZOLE SODIUM 40 MG PO TBEC
40.0000 mg | DELAYED_RELEASE_TABLET | Freq: Every day | ORAL | Status: DC
  Administered 2011-10-02 – 2011-10-03 (×2): 40 mg via ORAL
  Filled 2011-09-30 (×5): qty 1

## 2011-09-30 MED ORDER — DILTIAZEM LOAD VIA INFUSION
10.0000 mg | Freq: Once | INTRAVENOUS | Status: DC
  Administered 2011-09-30: 10 mg via INTRAVENOUS
  Filled 2011-09-30: qty 10

## 2011-09-30 MED ORDER — ONDANSETRON HCL 4 MG/2ML IJ SOLN: 4.0000 mg | Freq: Four times a day (QID) | INTRAMUSCULAR | Status: DC | PRN

## 2011-09-30 MED ORDER — POTASSIUM CHLORIDE CRYS ER 10 MEQ PO TBCR
10.0000 meq | EXTENDED_RELEASE_TABLET | Freq: Every day | ORAL | Status: DC
  Administered 2011-10-01 – 2011-10-04 (×4): 10 meq via ORAL
  Filled 2011-09-30 (×5): qty 1

## 2011-09-30 MED ORDER — LISINOPRIL 5 MG PO TABS
5.0000 mg | ORAL_TABLET | Freq: Every day | ORAL | Status: DC
  Filled 2011-09-30: qty 1

## 2011-09-30 MED ORDER — PREDNISONE 5 MG PO TABS: 5.0000 mg | ORAL_TABLET | Freq: Every day | ORAL | Status: DC

## 2011-09-30 MED ORDER — PIPERACILLIN-TAZOBACTAM 3.375 G IVPB
3.3750 g | Freq: Three times a day (TID) | INTRAVENOUS | Status: DC
  Administered 2011-09-30 – 2011-10-02 (×5): 3.375 g via INTRAVENOUS
  Filled 2011-09-30 (×8): qty 50

## 2011-09-30 MED ORDER — DILTIAZEM HCL 100 MG IV SOLR
5.0000 mg/h | INTRAVENOUS | Status: DC
  Administered 2011-09-30: 10 mg/h via INTRAVENOUS
  Filled 2011-09-30: qty 100

## 2011-09-30 NOTE — Progress Notes (Signed)
Pt assisted to [facility] voided. O2 at 2 LPM via [place]. Sats at 92% Pt still does not have Orders

## 2011-09-30 NOTE — Progress Notes (Signed)
Dr. Verta Ellen paged, returned call, made aware patient here for 2 hours with no orders. Md states will be here soon as possible. Awaiting orders.

## 2011-09-30 NOTE — Progress Notes (Signed)
Patient arrival via ems to 3736. Pt alert, oriented, aware of situation son with patient. Report from EMS, IV LAC started PTA. No acute distress noted. Pt oriented to room and equipment, vitals obtained, placed on monitor awaiting orders.

## 2011-09-30 NOTE — Progress Notes (Signed)
MD notified again pt still has not been seen and has not had any orders placed. Awaiting orders

## 2011-09-30 NOTE — H&P (Addendum)
PCP:  No primary provider on file.   DOA:  09/30/2011 11:19 AM  Chief Complaint:  Shortness of breath  HPI: Pt is 71 yo female who presented to PCP office today for worsening shortness of breath. Her PCP is Dr. Forestine Chute in [place] and he has called [place] today to have pt directly admitted for worsening shortness of breath. He has told me that he had CXR done 03/04 and has shown questionable PNA on the right side, he has started her on antibiotics and she has not gotten better, in addition her oxygen saturation in his office today was ~85% on RA. He requested direct admission for further evaluation. Pt reports worsening shortness of breath over the past week associated with productive cough of clearish sputum with subjective fevers and chills. She denies any chest pain except the one associated with coughing. In addition she denies any specific aggravating or alleviating factors. She denies other systemic symptom but does endorse poor oral intake. She denies any specific abdominal or urinary concerns. In addition, Dr. Christell Constant reported ? Confusion with slurred speech. Pt tells me she feels weak but no specific focal weakness, no headaches, and no visual changes.  Allergies: Allergies  Allergen Reactions  . Cephalexin   . Clarithromycin   . Esomeprazole Magnesium   . Hydrocodone   . Hydrocodone-Acetaminophen   . Latex   . Moxifloxacin   . Rabeprazole Sodium   . Sulfonamide Derivatives     Prior to Admission medications   Medication Sig Start Date End Date Taking? Authorizing Provider  acetaminophen (TYLENOL) 650 MG CR tablet Take 650 mg by mouth every 8 (eight) hours as needed. For pain   Yes Historical Provider, MD  aspirin 81 MG EC tablet Take 81 mg by mouth daily.     Yes Historical Provider, MD  brimonidine (ALPHAGAN) 0.2 % ophthalmic solution Place 1 drop into both eyes 3 (three) times daily.    Yes Historical Provider, MD  calcium-vitamin D (OSCAL) 250-125 MG-UNIT per tablet Take 1 tablet  by mouth daily.     Yes Historical Provider, MD  cholecalciferol (VITAMIN D) 1000 UNITS tablet Take 2,000 Units by mouth daily.   Yes Historical Provider, MD  ciclesonide (ALVESCO) 160 MCG/ACT inhaler Inhale 1 puff into the lungs 2 (two) times daily.     Yes Historical Provider, MD  dexlansoprazole (DEXILANT) 60 MG capsule Take 60 mg by mouth daily.     Yes Historical Provider, MD  diltiazem (CARDIZEM CD) 180 MG 24 hr capsule Take 180 mg by mouth 2 (two) times daily.     Yes Historical Provider, MD  guaiFENesin 200 MG tablet Take 200 mg by mouth 2 (two) times daily as needed. For congestion   Yes Historical Provider, MD  latanoprost (XALATAN) 0.005 % ophthalmic solution Place 1 drop into both eyes at bedtime.     Yes Historical Provider, MD  levalbuterol (XOPENEX HFA) 45 MCG/ACT inhaler Inhale 2 puffs into the lungs 2 (two) times daily.    Yes Historical Provider, MD  levothyroxine (SYNTHROID, LEVOTHROID) 50 MCG tablet Take 50 mcg by mouth daily.     Yes Historical Provider, MD  lisinopril (PRINIVIL,ZESTRIL) 5 MG tablet Take 5 mg by mouth daily.   Yes Historical Provider, MD  loratadine (CLARITIN) 10 MG tablet Take 10 mg by mouth daily.     Yes Historical Provider, MD  LORazepam (ATIVAN) 0.5 MG tablet Take 0.5-1 mg by mouth daily as needed. For anxiety   Yes Historical Provider, MD  montelukast (SINGULAIR) 10 MG tablet Take 10 mg by mouth daily.     Yes Historical Provider, MD  Multiple Vitamins-Minerals (ONE-A-DAY WEIGHT SMART ADVANCE) TABS Take 1 tablet by mouth daily.     Yes Historical Provider, MD  potassium chloride (K-DUR,KLOR-CON) 10 MEQ tablet Take 10 mEq by mouth daily.     Yes Historical Provider, MD  predniSONE (DELTASONE) 5 MG tablet Take 5 mg by mouth daily.     Yes Historical Provider, MD  traMADol (ULTRAM) 50 MG tablet Take 50 mg by mouth every 6 (six) hours as needed. For pain   Yes Historical Provider, MD    Past Medical History  Diagnosis Date  . Esophageal stricture   .  Gastritis   . GERD (gastroesophageal reflux disease)   . Osteoarthritis   . Glaucoma   . Rheumatoid arthritis   . Asthma   . Dysrhythmia     hx of irregular heart beat  . Shortness of breath   . Hypothyroidism   . Hypertension   . Pneumonia     Past Surgical History  Procedure Date  . Tubal ligation   . Appendectomy   . Right eye lid surgery   . Bilateral cataract extraction   . Cholecystectomy     Social History:  reports that she has never smoked. She has never used smokeless tobacco. She reports that she does not drink alcohol or use illicit drugs.  Family History  Problem Relation Age of Onset  . Colon cancer Neg Hx   . Diabetes Mother   . Diabetes Sister     Review of Systems:  Constitutional: per HPI HEENT: Denies photophobia, eye pain, redness, hearing loss, ear pain, congestion, sore throat, rhinorrhea, sneezing, mouth sores, trouble swallowing, neck pain, neck stiffness and tinnitus.   Respiratory: Denies chest tightness,  and wheezing.   Cardiovascular: Denies chest pain, palpitations and leg swelling.  Gastrointestinal: Denies nausea, vomiting, abdominal pain, diarrhea, constipation, blood in stool and abdominal distention.  Genitourinary: Denies dysuria, urgency, frequency, hematuria, flank pain and difficulty urinating.  Musculoskeletal: Denies myalgias, back pain, joint swelling, arthralgias and gait problem.  Skin: Denies pallor, rash and wound.  Neurological: Denies dizziness, seizures, syncope, light-headedness, numbness and headaches.  Hematological: Denies adenopathy. Easy bruising, personal or family bleeding history  Psychiatric/Behavioral: Denies suicidal ideation, mood changes, confusion, nervousness, sleep disturbance and agitation   Physical Exam:  Filed Vitals:   09/30/11 1125 09/30/11 1400 09/30/11 1558  BP: 86/52 111/58   Pulse: 83 83   Temp: 97 F (36.1 C) 97.8 F (36.6 C)   TempSrc: Oral Oral   Resp: 20 18   Height:   5\' 5"   (1.651 m)  Weight:   93.895 kg (207 lb)  SpO2: 91% 94%     Constitutional: Vital signs reviewed.  Patient is a well-developed and well-nourished in no acute distress and cooperative with exam. Alert and oriented x3.  Head: Normocephalic and atraumatic Ear: TM normal bilaterally Mouth: no erythema or exudates, MMM Eyes: PERRL, EOMI, conjunctivae normal, No scleral icterus.  Neck: Supple, Trachea midline normal ROM, No JVD, mass, thyromegaly, or carotid bruit present.  Cardiovascular: RRR, S1 normal, S2 normal, no MRG, pulses symmetric and intact bilaterally Pulmonary/Chest: CTAB, expiratory and inspiratory wheezing with scattered crackles bilaterally Abdominal: Soft. Non-tender, non-distended, bowel sounds are normal, no masses, organomegaly, or guarding present.  GU: no CVA tenderness Musculoskeletal: No joint deformities, erythema, or stiffness, ROM full and no nontender Ext: no edema and no cyanosis, pulses palpable  bilaterally (DP and PT) Hematology: no cervical, inginal, or axillary adenopathy.  Neurological: A&O x3, Strenght is normal and symmetric bilaterally, cranial nerve II-XII are grossly intact, no focal motor deficit, sensory intact to light touch bilaterally.  Skin: Warm, dry and intact. No rash, cyanosis, or clubbing.  Psychiatric: Normal mood and affect. speech and behavior is normal. Judgment and thought content normal. Cognition and memory are normal.   Labs on Admission:  No results found for this or any previous visit (from the past 48 hour(s)).  Radiological Exams on Admission: No results found.  Assessment/Plan  Active Problems:  Shortness of breath - unclear what the exact etiology is at this time but likely related to PNA - will follow upon CXR findings and will also obtain CTA chest for further evaluation - obtain blood work including: CMET, CBC, PCT, blood cultures for T > 101.26F, Mg and Phos level - monitor vitals per floor protocol - will also start with  cardiac work up even though I don't think her SOB is cardiac in etiology - CE's x 3, EKG, TSH, BNP - obtain 2 D ECHO - will also obtain ABG's as I am not sure if hypoxia still persists - low threshold for intubation   Arrhythmia - atrial fibrilation - please note that pt has hx of a-fib but is not on anticoagulation - will obtain cardiology consult - proceed with obtaining CE's, EKG, TSH, BNP - obtain 2 D ECHO - start cardizem drip and titrate for goal HR 60-100 - hold BP meds given hypotension   Slurred speech - no focal deficits on physical exam but was worrisome for PCP this AM, this could be related to TIA rather than acute stroke - will proceed with CT head for further evaluation and will check 2 D ECHO - will continue ASA for now, ? For cardio on Coumadin vs ASA   Essential hypertension, benign - hold home BP meds given low BP   GERD - stable  Time Spent on Admission: Over 30 minutes  MAGICK-Shalynn Jorstad 09/30/2011, 4:36 PM  [facility] [phone]

## 2011-09-30 NOTE — Progress Notes (Addendum)
ANTIBIOTIC CONSULT NOTE - INITIAL  Pharmacy Consult for Vancomycin and Zosyn Indication: rule out pneumonia  Allergies  Allergen Reactions  . Cephalexin   . Clarithromycin   . Esomeprazole Magnesium   . Hydrocodone   . Hydrocodone-Acetaminophen   . Latex   . Moxifloxacin   . Rabeprazole Sodium   . Sulfonamide Derivatives     Patient Measurements: Height: 5\' 5"  (165.1 cm) Weight: 207 lb (93.895 kg) IBW/kg (Calculated) : 57   Vital Signs: Temp: 98.3 F (36.8 C) (03/07 1740) Temp src: Oral (03/07 1740) BP: 118/53 mmHg (03/07 1900) Pulse Rate: 94  (03/07 1900) Intake/Output from previous day:   Intake/Output from this shift:    Labs:  Basename 09/30/11 1603  WBC 18.4*  HGB 12.8  PLT 167  LABCREA --  CREATININE 1.16*   Estimated Creatinine Clearance: 50.4 ml/min (by C-G formula based on Cr of 1.16). No results found for this basename: VANCOTROUGH:2,VANCOPEAK:2,VANCORANDOM:2,GENTTROUGH:2,GENTPEAK:2,GENTRANDOM:2,TOBRATROUGH:2,TOBRAPEAK:2,TOBRARND:2,AMIKACINPEAK:2,AMIKACINTROU:2,AMIKACIN:2, in the last 72 hours   Microbiology: No results found for this or any previous visit (from the past 720 hour(s)).  Medical History: Past Medical History  Diagnosis Date  . Esophageal stricture   . Gastritis   . GERD (gastroesophageal reflux disease)   . Osteoarthritis   . Glaucoma   . Rheumatoid arthritis   . Asthma   . Dysrhythmia     hx of irregular heart beat  . Shortness of breath   . Hypothyroidism   . Hypertension   . Pneumonia     Medications:  Scheduled:    . albuterol  2.5 mg Nebulization Q6H  . aspirin  81 mg Oral Daily  . azithromycin  500 mg Intravenous Q24H  . brimonidine  1 drop Both Eyes TID  . calcium-vitamin D  1 tablet Oral Q breakfast  . diltiazem  10 mg Intravenous Once  . enoxaparin  30 mg Subcutaneous Q24H  . latanoprost  1 drop Both Eyes QHS  . levothyroxine  50 mcg Oral Q breakfast  . methylPREDNISolone (SOLU-MEDROL) injection  80 mg  Intravenous Q8H  . montelukast  10 mg Oral Daily  . pantoprazole  40 mg Oral Q1200  . potassium chloride  10 mEq Oral Daily  . sodium chloride  3 mL Intravenous Q12H  . sodium chloride  3 mL Intravenous Q12H  . DISCONTD: calcium-vitamin D  1 tablet Oral Daily  . DISCONTD: cefTRIAXone (ROCEPHIN)  IV  1 g Intravenous Q24H  . DISCONTD: ciclesonide  1 puff Inhalation BID  . DISCONTD: diltiazem  180 mg Oral BID  . DISCONTD: fluticasone  1 puff Inhalation BID  . DISCONTD: levalbuterol  2 puff Inhalation BID  . DISCONTD: lisinopril  5 mg Oral Daily  . DISCONTD: loratadine  10 mg Oral Daily  . DISCONTD: methylPREDNISolone (SOLU-MEDROL) injection  80 mg Intravenous Q12H  . DISCONTD: predniSONE  5 mg Oral Daily   Assessment: 69 YOF presented with SOB and leukocytosis, suspected for pneumonia. Pharmacy is consulted to start vancomycin and zosyn for empiric coverage. Patient is noted with allergy to cephalexin, RN spoke to patient, the reaction is itchiness and rash. Patient is also tolerated a dose of rocephin earlier today (dose given, but not charted). Recommend RN to closely monitor patient for allergy reactions during first zosyn dose. Pt. Scr is elevated from baseline (1.16 from 0.6), est. crcl 43ml/min. Cultures are pending.  Goal of Therapy:  Vancomycin trough level 15-20 mcg/ml  Plan:  - Vancomycin 750 mg IV Q12hrs - Zosyn 3,375g IV Q8hrs - monitor  renal function and clinical course   Riki Rusk 09/30/2011,7:15 PM  Addum:  SrCr 0.88 today.  Will increase vanc to 1 gm IV q12 hours.  F/u trough when appropriate. Rickard Kennerly,PharmD

## 2011-09-30 NOTE — Consult Note (Signed)
CARDIOLOGY CONSULT NOTE   Patient ID: Stacey Davenport MRN: [id number] DOB/AGE: Nov 21, 1940 71 y.o.  Admit date: 09/30/2011  Primary Physician   Dr Rudi Heap Primary Cardiologist   [facility] Reason for Consultation   Atrial fib  [id number] Riches is a 71 y.o. female with no history of CAD.  She has a history of palpitations and may have PAF but this is not documented. She developed a URI and saw her primary MD yesterday, getting ABX and nebs. Today, her symptoms worsened and she was unable to ambulate. Her family was concerned that she had drawing to the left side of her mouth, increased SOB and was extremely weak so she saw her MD again. She was hypotensive and hypoxic and sent to the ER. After admission, she went into rapid afib and cardiology was asked to evaluate her.   She felt the onset of the tachypalpitations. She gets these occasionally at home and they last about 30 minutes. She will relax and rest and they resolve without further intervention. This happens 2-3 x per year. She has not had a prolonged episode in several years. She takes her cardizem regularly but missed her dose last pm because she was confused. The episode today did not cause chest pain or increased SOB. She denies presyncope with the palpitations. Other than her URI symptoms, she is resting comfortably.   Past Medical History  Diagnosis Date  . Esophageal stricture   . Gastritis   . GERD (gastroesophageal reflux disease)   . Osteoarthritis   . Glaucoma   . Rheumatoid arthritis   . Asthma   . Dysrhythmia     hx of irregular heart beat  . Shortness of breath   . Hypothyroidism   . Hypertension    Hx chest pain, Lexiscan stress test negative 07/22/2009  . Pneumonia      Past Surgical History  Procedure Date  . Tubal ligation   . Appendectomy   . Right eye lid surgery   . Bilateral cataract extraction   . Cholecystectomy     Allergies  Allergen Reactions  . Cephalexin   . Clarithromycin   .  Esomeprazole Magnesium   . Hydrocodone   . Hydrocodone-Acetaminophen   . Latex   . Moxifloxacin   . Rabeprazole Sodium   . Sulfonamide Derivatives     I have reviewed the patient's current medications. Scheduled Meds:   . albuterol  2.5 mg Nebulization Q6H  . aspirin  81 mg Oral Daily  . azithromycin  500 mg Intravenous Q24H  . brimonidine  1 drop Both Eyes TID  . calcium-vitamin D  1 tablet Oral Q breakfast  . diltiazem  10 mg Intravenous Once  . enoxaparin  30 mg Subcutaneous Q24H  . latanoprost  1 drop Both Eyes QHS  . levothyroxine  50 mcg Oral Q breakfast  . methylPREDNISolone (SOLU-MEDROL) injection  80 mg Intravenous Q8H  . montelukast  10 mg Oral Daily  . pantoprazole  40 mg Oral Q1200  . potassium chloride  10 mEq Oral Daily  . sodium chloride  3 mL Intravenous Q12H  . sodium chloride  3 mL Intravenous Q12H   Continuous Infusions:   . diltiazem (CARDIZEM) infusion     PRN Meds:.sodium chloride, guaiFENesin, LORazepam, ondansetron (ZOFRAN) IV, ondansetron, sodium chloride, traMADol  Prescriptions prior to admission  Medication Sig Dispense Refill  . acetaminophen (TYLENOL) 650 MG CR tablet Take 650 mg by mouth every 8 (eight) hours as needed. For pain      .  aspirin 81 MG EC tablet Take 81 mg by mouth daily.        . brimonidine (ALPHAGAN) 0.2 % ophthalmic solution Place 1 drop into both eyes 3 (three) times daily.       . calcium-vitamin D (OSCAL) 250-125 MG-UNIT per tablet Take 1 tablet by mouth daily.        . cholecalciferol (VITAMIN D) 1000 UNITS tablet Take 2,000 Units by mouth daily.      . ciclesonide (ALVESCO) 160 MCG/ACT inhaler Inhale 1 puff into the lungs 2 (two) times daily.        Marland Kitchen dexlansoprazole (DEXILANT) 60 MG capsule Take 60 mg by mouth daily.        Marland Kitchen diltiazem (CARDIZEM CD) 180 MG 24 hr capsule Take 180 mg by mouth 2 (two) times daily.        Marland Kitchen guaiFENesin 200 MG tablet Take 200 mg by mouth 2 (two) times daily as needed. For congestion      .  latanoprost (XALATAN) 0.005 % ophthalmic solution Place 1 drop into both eyes at bedtime.        . levalbuterol (XOPENEX HFA) 45 MCG/ACT inhaler Inhale 2 puffs into the lungs 2 (two) times daily.       Marland Kitchen levothyroxine (SYNTHROID, LEVOTHROID) 50 MCG tablet Take 50 mcg by mouth daily.        Marland Kitchen lisinopril (PRINIVIL,ZESTRIL) 5 MG tablet Take 5 mg by mouth daily.      Marland Kitchen loratadine (CLARITIN) 10 MG tablet Take 10 mg by mouth daily.        Marland Kitchen LORazepam (ATIVAN) 0.5 MG tablet Take 0.5-1 mg by mouth daily as needed. For anxiety      . montelukast (SINGULAIR) 10 MG tablet Take 10 mg by mouth daily.        . Multiple Vitamins-Minerals (ONE-A-DAY WEIGHT SMART ADVANCE) TABS Take 1 tablet by mouth daily.        . potassium chloride (K-DUR,KLOR-CON) 10 MEQ tablet Take 10 mEq by mouth daily.        . predniSONE (DELTASONE) 5 MG tablet Take 5 mg by mouth daily.        . traMADol (ULTRAM) 50 MG tablet Take 50 mg by mouth every 6 (six) hours as needed. For pain         History   Social History  . Marital Status: Divorced    Spouse Name: N/A    Number of Children: N/A  . Years of Education: N/A   Occupational History  . Not on file.   Social History Main Topics  . Smoking status: Never Smoker   . Smokeless tobacco: Never Used  . Alcohol Use: No  . Drug Use: No  . Sexually Active: No   Other Topics Concern  . Not on file   Social History Narrative   Retired.      Family History  Problem Relation Age of Onset  . Colon cancer Neg Hx   . Diabetes Mother   . Diabetes Sister      ROS: She had fevers to 102. She has a cough productive of greenish/yellow sputum. She is significantly limited in ambulation because of her RA. No melena, no significant reflux. She wears glasses. Full 14 point review of systems complete and found to be negative unless listed above.  Physical Exam: Blood pressure 106/71, pulse 114, temperature 98.3 F (36.8 C), temperature source Oral, resp. rate 24, height 5\' 5"   (1.651 m), weight 207 lb (93.895 kg),  SpO2 96.00%.   General: Well developed, well nourished, elderly female in no acute distress Head: Eyes PERRLA, No xanthomas.   Normocephalic and atraumatic, oropharynx without edema or exudate. Dentition OK. Lungs: bilateral rales with some rhonchi, no wheezing heard. Heart:  Heart irregular rate and rhythm with S1, S2, no significant murmur. pulses are 2+ & equal all 4 extrem.   Neck: No carotid bruit. No lymphadenopathy.  JVD not elevated. Abdomen: Bowel sounds present, abdomen soft and non-tender without masses or hernias noted. Msk:  No spine or cva tenderness. She is weak generally, no joint deformities or effusions. Extremities: No clubbing or cyanosis. No edema.  Neuro: Alert and oriented X 3. No focal deficits noted. Psych:  Good affect, responds appropriately Skin: No rashes or lesions noted.  Labs:   Lab Results  Component Value Date   WBC 18.4* 09/30/2011   HGB 12.8 09/30/2011   HCT 39.4 09/30/2011   MCV 84.2 09/30/2011   PLT 167 09/30/2011   No results found for this basename: INR in the last 72 hours  Lab 09/30/11 1603  NA 139  K 4.4  CL 106  CO2 22  BUN 16  CREATININE 1.16*  CALCIUM 9.2  PROT 6.4  BILITOT 0.5  ALKPHOS 106  ALT 17  AST 46*  GLUCOSE 101*    Basename 09/30/11 1603  CKTOTAL 1203*  CKMB 15.7*  TROPONINI <0.30   Pro B Natriuretic peptide (BNP)  Date/Time Value Range Status  09/30/2011  4:09 PM 1510.0* 0-125 (pg/mL) Final  07/14/2009  9:20 PM 48.0  0.0-100.0 (pg/mL) Final   Radiology:  X-ray Chest Pa And Lateral   09/30/2011  *RADIOLOGY REPORT*  Clinical Data: Shortness of breath, weakness  CHEST - 2 VIEW  Comparison: Chest x-ray of 07/14/2009  Findings: There is abnormal opacity medially at the left lung base. This may represent pneumonia and small effusion, but follow-up chest x-ray is recommended to exclude a left lower lobe lung mass. There also appears to be a small right pleural effusion present. The lungs are  hyperaerated.  Mild cardiomegaly is stable.  There is a thoracic kyphosis again noted convex to the right.  IMPRESSION:  1.  Mass-like opacity posteriorly in the left lower lobe.  Possible pneumonia and small effusion but cannot exclude lung mass. Recommend follow-up chest x-ray to ensure clearing. 2.  Small right pleural effusion. 3.  Stable cardiomegaly and thoracic scoliosis.  Original Report Authenticated By: Juline Patch, M.D.    EKG:  Telemetry reviewed - initial sinus rhythm, atrial fib/RVR and repeat ECG pending  ASSESSMENT AND PLAN:   The patient was seen today by Dr Elease Hashimoto, the patient evaluated and the data reviewed.   1. Atrial fib - her rate improved with cardizem. She has  converted spontaneously to sinus rhythm. She needs to be maintained on cardizem, perhaps at a higher dose because of the steroids/acute illness. We will leave her on IV Rx tonight in case she goes back into afib. Tomorrow, she can be changed to PO Rx at doses her BP will allow.  2. Anticoag - her afib duration was brief but there was concern for CVA symptoms on admission and she has a history of occasional episodes PTA. Her CHADS score is 1. Anticoagulation per MD.   3. PNA and other issues, per primary MD. Signed: Theodore Demark 09/30/2011, 6:10 PM Co-Sign MD  Attending Note:   The patient was seen and examined.  Agree with assessment and plan as noted above.  Pt has now converted to sinus rhythm.  She is feeling a bit better and could tell right away that she had converted.  Would check a TSH.  She has a CHADS score of 1.  We could treat her with ASA 325 mg a day vs. Coumadin.  I would favor ASA as I think this episode was caused by her respiratory illness.  Vesta Mixer, Montez Hageman., MD, [facility] 09/30/2011, 8:30 PM

## 2011-09-30 NOTE — Significant Event (Signed)
Rapid Response Event Note  Overview: Time Called: 1736 Arrival Time: 1738 Event Type: Cardiac (pt in rapid A-fib)  Initial Focused Assessment: pt c/o of sob O2 sat 95% pn 2L [place]. Pt recently back from radiology and has a ? Pneumonia/mass. Pt has pos productive cough at times.    Currently on a cardizem gtt @ 5mg /hr.  Pt states she feels jittery but this is normal when her heart is racing   Interventions: increased cardizem to 10mg  IV and MD gave an order to give 5mg  iv bolus of cardizem   Event Summary: received orders to tx pt to sdu for cardizem gtt and cardiology being consulted.     Tx pt via bed, on telemetry, and O2 to room 2916.  Care resumed by 2900 RN    Courtlyn Aki, Aggie Hacker

## 2011-10-01 ENCOUNTER — Inpatient Hospital Stay (HOSPITAL_COMMUNITY): Payer: Medicare Other

## 2011-10-01 DIAGNOSIS — I319 Disease of pericardium, unspecified: Secondary | ICD-10-CM

## 2011-10-01 LAB — GLUCOSE, CAPILLARY: Glucose-Capillary: 139 mg/dL — ABNORMAL HIGH (ref 70–99)

## 2011-10-01 LAB — BASIC METABOLIC PANEL
CO2: 19 mEq/L (ref 19–32)
Calcium: 9.1 mg/dL (ref 8.4–10.5)
Creatinine, Ser: 0.88 mg/dL (ref 0.50–1.10)
GFR calc non Af Amer: 65 mL/min — ABNORMAL LOW (ref >90)
Sodium: 141 mEq/L (ref 135–145)

## 2011-10-01 LAB — CBC
MCV: 83.3 fL (ref 78.0–100.0)
Platelets: 170 10*3/uL (ref 150–400)
RBC: 4.3 MIL/uL (ref 3.87–5.11)
RDW: 14.6 % (ref 11.5–15.5)
WBC: 11.2 10*3/uL — ABNORMAL HIGH (ref 4.0–10.5)

## 2011-10-01 LAB — CARDIAC PANEL(CRET KIN+CKTOT+MB+TROPI)
CK, MB: 11 ng/mL (ref 0.3–4.0)
Troponin I: <0.3 ng/mL (ref <0.30)

## 2011-10-01 MED ORDER — VANCOMYCIN HCL IN DEXTROSE 1-5 GM/200ML-% IV SOLN
1000.0000 mg | Freq: Two times a day (BID) | INTRAVENOUS | Status: DC
  Administered 2011-10-01: 1000 mg via INTRAVENOUS
  Filled 2011-10-01 (×4): qty 200

## 2011-10-01 MED ORDER — LEVALBUTEROL HCL 0.63 MG/3ML IN NEBU
0.6300 mg | INHALATION_SOLUTION | Freq: Four times a day (QID) | RESPIRATORY_TRACT | Status: DC
  Administered 2011-10-01 – 2011-10-04 (×9): 0.63 mg via RESPIRATORY_TRACT
  Filled 2011-10-01 (×16): qty 3

## 2011-10-01 MED ORDER — ENOXAPARIN SODIUM 40 MG/0.4ML ~~LOC~~ SOLN
40.0000 mg | SUBCUTANEOUS | Status: DC
  Administered 2011-10-01 – 2011-10-03 (×3): 40 mg via SUBCUTANEOUS
  Filled 2011-10-01 (×4): qty 0.4

## 2011-10-01 MED ORDER — METHYLPREDNISOLONE SODIUM SUCC 40 MG IJ SOLR
40.0000 mg | Freq: Two times a day (BID) | INTRAMUSCULAR | Status: DC
  Administered 2011-10-02: 40 mg via INTRAVENOUS
  Filled 2011-10-01 (×3): qty 1

## 2011-10-01 NOTE — Evaluation (Signed)
Occupational Therapy Evaluation Patient Details Name: Stacey Davenport MRN: [id number] DOB: 1941/01/08 Today's Date: 10/01/2011  Problem List:  Patient Active Problem List  Diagnoses  . GLUCOMA  . Essential hypertension, benign  . ASTHMA  . ESOPHAGEAL STRICTURE  . GERD  . GASTRITIS  . RHEUMATOID ARTHRITIS  . PALPITATIONS  . DYSPHAGIA  . CHEST PAIN, HX OF  . Shortness of breath  . Arrhythmia  . Atrial fibrillation    Past Medical History:  Past Medical History  Diagnosis Date  . Esophageal stricture   . Gastritis   . GERD (gastroesophageal reflux disease)   . Osteoarthritis   . Glaucoma   . Rheumatoid arthritis   . Asthma   . Dysrhythmia     hx of irregular heart beat  . Shortness of breath   . Hypothyroidism   . Hypertension   . Pneumonia    Past Surgical History:  Past Surgical History  Procedure Date  . Tubal ligation   . Appendectomy   . Right eye lid surgery   . Bilateral cataract extraction   . Cholecystectomy     OT Assessment/Plan/Recommendation OT Assessment Clinical Impression Statement: This 71 yo female admitted SOB with possible PNA and 85% sats at PCP's office presents to acute OT with deficits below thus affecting pt's PLOF at I with BADLs/IADLs. Will benefit from acute OT with follow-up at SNF to get back to an Independent level to return home OT Recommendation/Assessment: Patient will need skilled OT in the acute care venue OT Problem List: Decreased strength;Impaired balance (sitting and/or standing);Decreased knowledge of use of DME or AE;Cardiopulmonary status limiting activity Barriers to Discharge: Decreased caregiver support OT Therapy Diagnosis : Generalized weakness OT Plan OT Frequency: Min 2X/week OT Treatment/Interventions: Self-care/ADL training;Energy conservation;Patient/family education;Balance training;DME and/or AE instruction OT Recommendation Follow Up Recommendations: Skilled nursing facility Equipment Recommended: Defer to  next venue Individuals Consulted Consulted and Agree with Results and Recommendations: Patient;Family member/caregiver (daughter from [place] area) OT Goals Acute Rehab OT Goals OT Goal Formulation: With patient ADL Goals Pt Will Perform Grooming: with set-up;with supervision;Standing at sink;Unsupported;Other (comment) (2 tasks) ADL Goal: Grooming - Progress: Goal set today Pt Will Transfer to Toilet: with supervision;with DME;Ambulation;Comfort height toilet;Grab bars ADL Goal: Toilet Transfer - Progress: Goal set today Pt Will Perform Toileting - Clothing Manipulation: Standing;Other (comment) (S for balance) ADL Goal: Toileting - Clothing Manipulation - Progress: Goal set today Additional ADL Goal #1: Pt will be I with supine to sit and sit to supine for BADLs ADL Goal: Additional Goal #1 - Progress: Goal set today Miscellaneous OT Goals Miscellaneous OT Goal #1: Pt will be aware of energy conservation stratgies that will be useful to her. OT Goal: Miscellaneous Goal #1 - Progress: Goal set today  OT Evaluation Precautions/Restrictions  Precautions Precautions: Fall Required Braces or Orthoses: No Restrictions Weight Bearing Restrictions: No Prior Functioning     ADL ADL Eating/Feeding: Simulated;Independent Where Assessed - Eating/Feeding: Chair Grooming: Simulated;Supervision/safety;Set up Where Assessed - Grooming: Sitting, bed;Unsupported Upper Body Bathing: Simulated;Supervision/safety;Set up Where Assessed - Upper Body Bathing: Unsupported;Sitting, bed Lower Body Bathing: Simulated;Set up;Supervision/safety;Other (comment) (min A sit to stand) Where Assessed - Lower Body Bathing: Supported;Sit to stand from bed Upper Body Dressing: Simulated;Supervision/safety;Set up Where Assessed - Upper Body Dressing: Unsupported;Sitting, bed Lower Body Dressing: Simulated;Supervision/safety;Set up;Other (comment) (min A sit to stand) Where Assessed - Lower Body Dressing:  Supported;Sit to stand from bed Toilet Transfer: Simulated;Minimal assistance Toilet Transfer Details (indicate cue type and reason): Bed to chair  5 feet away  Toilet Transfer Method: Ambulating Toileting - Clothing Manipulation: Simulated;Independent;Other (comment) (Min A for standing balance) Where Assessed - Toileting Clothing Manipulation: Standing Toileting - Hygiene: Simulated;Independent Where Assessed - Toileting Hygiene: Sit on 3-in-1 or toilet Tub/Shower Transfer: Not assessed Tub/Shower Transfer Method: Not assessed Ambulation Related to ADLs: Min A Vision/Perception  Vision - History Baseline Vision: No visual deficits Cognition Cognition Arousal/Alertness: Awake/alert Overall Cognitive Status: Appears within functional limits for tasks assessed Orientation Level: Oriented X4 Sensation/Coordination Sensation Light Touch: Appears Intact Stereognosis: Not tested Hot/Cold: Not tested Proprioception: Not tested Coordination Gross Motor Movements are Fluid and Coordinated: Yes Fine Motor Movements are Fluid and Coordinated: Yes Extremity Assessment RUE Assessment RUE Assessment: Within Functional Limits LUE Assessment LUE Assessment: Within Functional Limits Mobility  Bed Mobility Bed Mobility: Yes Rolling Right: 4: Min assist;With rail Rolling Right Details (indicate cue type and reason): Needed a hand to get to EOB Right Sidelying to Sit: 4: Min assist;With rails;HOB flat Sitting - Scoot to Edge of Bed: 5: Supervision Sitting - Scoot to [employer] of Bed Details (indicate cue type and reason): Needed cues to scoot to EOB and get feet on floor Transfers Sit to Stand: 4: Min assist;From elevated surface;With upper extremity assist;From bed Sit to Stand Details (indicate cue type and reason): cues for hand placement and steadying assist overall; could not get up from low recliner without min assist but patient states she does not sit in low chairs.   Stand to Sit: 4: Min  assist;With upper extremity assist;With armrests;To chair/3-in-1 Stand to Sit Details: assist to control descent Exercises   End of Session OT - End of Session Equipment Utilized During Treatment: Gait belt;Other (comment) (RW) Activity Tolerance: Patient tolerated treatment well Patient left: in chair;with call bell in reach;with family/visitor present (daughter from [place]) Nurse Communication: Mobility status for transfers;Mobility status for ambulation General Behavior During Session: [facility] for tasks performed Cognition: [facility] for tasks performed  Co-eval with PT Alvis Lemmings)   Evette Georges [phone] 10/01/2011, 1:29 PM

## 2011-10-01 NOTE — Evaluation (Signed)
Physical Therapy Evaluation Patient Details Name: Lorey Pallett MRN: [id number] DOB: [birth year] Today's Date: 10/01/2011  Problem List:  Patient Active Problem List  Diagnoses  . GLUCOMA  . Essential hypertension, benign  . ASTHMA  . ESOPHAGEAL STRICTURE  . GERD  . GASTRITIS  . RHEUMATOID ARTHRITIS  . PALPITATIONS  . DYSPHAGIA  . CHEST PAIN, HX OF  . Shortness of breath  . Arrhythmia  . Atrial fibrillation    Past Medical History:  Past Medical History  Diagnosis Date  . Esophageal stricture   . Gastritis   . GERD (gastroesophageal reflux disease)   . Osteoarthritis   . Glaucoma   . Rheumatoid arthritis   . Asthma   . Dysrhythmia     hx of irregular heart beat  . Shortness of breath   . Hypothyroidism   . Hypertension   . Pneumonia    Past Surgical History:  Past Surgical History  Procedure Date  . Tubal ligation   . Appendectomy   . Right eye lid surgery   . Bilateral cataract extraction   . Cholecystectomy     PT Assessment/Plan/Recommendation PT Assessment Clinical Impression Statement: Patient admitted with SOB and a-fib.  Patient somewhat deconditioned.  Patient needs to go to a NH with therapy to improve her strength and endurance prior to d/c home alone.  Patient agreeable. PT Recommendation/Assessment: Patient will need skilled PT in the acute care venue PT Problem List: Decreased activity tolerance;Decreased balance;Decreased mobility;Decreased knowledge of use of DME;Decreased safety awareness;Cardiopulmonary status limiting activity Barriers to Discharge: Decreased caregiver support PT Therapy Diagnosis : Difficulty walking;Generalized weakness PT Plan PT Frequency: Min 3X/week PT Treatment/Interventions: Gait training;DME instruction;Functional mobility training;Therapeutic activities;Therapeutic exercise;Balance training;Patient/family education PT Recommendation Follow Up Recommendations: Skilled nursing facility;Supervision/Assistance - 24  hour Equipment Recommended: Rolling walker with 5" wheels PT Goals  Acute Rehab PT Goals PT Goal Formulation: With patient Time For Goal Achievement: 2 weeks Pt will go Supine/Side to Sit: Independently PT Goal: Supine/Side to Sit - Progress: Goal set today Pt will go Sit to Stand: Independently PT Goal: Sit to Stand - Progress: Goal set today Pt will Ambulate: >150 feet;with supervision;with least restrictive assistive device PT Goal: Ambulate - Progress: Goal set today Pt will Perform Home Exercise Program: with supervision, verbal cues required/provided PT Goal: Perform Home Exercise Program - Progress: Goal set today  PT Evaluation Precautions/Restrictions  Precautions Precautions: Fall Required Braces or Orthoses: No Restrictions Weight Bearing Restrictions: No Prior Functioning  Home Living Lives With: Alone Receives Help From:  (Family works - 3 sons and daughter lives away) Type of Home: Apartment Home Layout: Multi-level Alternate Level Stairs-Number of Steps: elevator at apartment on third floor Home Access: Elevator Bathroom Shower/Tub: Tub/shower unit;Curtain Bathroom Toilet: Standard Home Adaptive Equipment: Grab bars around toilet;Grab bars in shower;Raised toilet seat with rails;Straight cane (raised toilet seat without rails) Prior Function Level of Independence: Independent with basic ADLs;Independent with homemaking with ambulation;Requires assistive device for independence;Independent with gait Driving: Yes Vocation: On disability Comments: Retail until 2000 Cognition Cognition Arousal/Alertness: Awake/alert Overall Cognitive Status: Appears within functional limits for tasks assessed Orientation Level: Oriented X4 Sensation/Coordination Sensation Light Touch: Appears Intact Stereognosis: Not tested Hot/Cold: Not tested Proprioception: Not tested Coordination Gross Motor Movements are Fluid and Coordinated: Yes Fine Motor Movements are Fluid and  Coordinated: Yes Extremity Assessment RUE Assessment RUE Assessment: Within Functional Limits LUE Assessment LUE Assessment: Within Functional Limits RLE Assessment RLE Assessment: Within Functional Limits LLE Assessment LLE Assessment: Within Functional Limits Mobility (  including Balance) Bed Mobility Bed Mobility: Yes Rolling Right: 4: Min assist;With rail Rolling Right Details (indicate cue type and reason): Needed a hand to get to EOB Right Sidelying to Sit: 4: Min assist;With rails;HOB flat Sitting - Scoot to Edge of Bed: 5: Supervision Sitting - Scoot to [employer] of Bed Details (indicate cue type and reason): Needed cues to scoot to EOB and get feet on floor Transfers Transfers: Yes Sit to Stand: 4: Min assist;From elevated surface;With upper extremity assist;From bed Sit to Stand Details (indicate cue type and reason): cues for hand placement and steadying assist overall; could not get up from low recliner without min assist but patient states she does not sit in low chairs.   Stand to Sit: 4: Min assist;With upper extremity assist;With armrests;To chair/3-in-1 Stand to Sit Details: assist to control descent Ambulation/Gait Ambulation/Gait: Yes Ambulation/Gait Assistance: 4: Min assist Ambulation/Gait Assistance Details (indicate cue type and reason): Ambulated without RW with patient feeling unsteady and leaning posteriorly quite significantly about 3 steps.  Ambulated wtih RW with patient needing cues only to stay close to RW for safety awareness about 14 feet.   Ambulation Distance (Feet): 17 Feet Assistive device: Rolling walker Gait Pattern: Step-through pattern;Decreased stride length;Decreased step length - left;Decreased step length - right Stairs: No Wheelchair Mobility Wheelchair Mobility: No  Posture/Postural Control Posture/Postural Control: No significant limitations Balance Balance Assessed: No    End of Session PT - End of Session Equipment Utilized During  Treatment: Gait belt Activity Tolerance: Patient limited by fatigue Patient left: in chair;with call bell in reach;with family/visitor present Nurse Communication: Mobility status for transfers;Mobility status for ambulation General Behavior During Session: [facility] for tasks performed Cognition: [facility] for tasks performed  INGOLD,Capucine Tryon 10/01/2011, 12:56 PM  [facility] Acute Rehabilitation [phone] (pager)

## 2011-10-01 NOTE — Progress Notes (Signed)
.  Clinical social worker completed patient psychosocial assessment, please see assessment in patient shadow chart.  Pt and pt family open to short term rehab at snf in [place] or [facility].  However pt and pt family are torn between pt going to rehab or home with home health. Per discussion with pt daughter, pt insurance will pay for an aid for 5-8 hours each day m-f to help assist with pt needs at home.   Pt and Pt family agreed to snf search as pt family continue to discuss disposition options.  .Clinical social worker initiated skilled nursing facility search, see placement note in patient shadow chart.   Marland KitchenFL2 completed and placed in patient shadow chart for physician signature.   .Clinical social worker continuing to follow pt to assist with pt dc plans and further csw needs.   Catha Gosselin, Theresia Majors  [phone] .10/01/2011 15:02pm

## 2011-10-01 NOTE — Progress Notes (Signed)
[facility] [place] TEAM 1 - Stepdown/ICU TEAM  Subjective: 71 yo female who presented to her PCP for worsening shortness of breath.  Due to her distress, a direct admit to [facility] was arranged.  She was being treated for a possible R lung PNA as an outpt.    Today she reports that she feels better, but that her breathing is still not back to baseline.  She denies CP, further palpitations, abdom pain, n/v, or HA.  Objective: Weight change:   Intake/Output Summary (Last 24 hours) at 10/01/11 1414 Last data filed at 10/01/11 1300  Gross per 24 hour  Intake 1308.17 ml  Output    850 ml  Net 458.17 ml   Blood pressure 98/46, pulse 72, temperature 97.9 F (36.6 C), temperature source Oral, resp. rate 25, height 5\' 5"  (1.651 m), weight 93.895 kg (207 lb), SpO2 94.00%.  Physical Exam: General: No acute respiratory distress Lungs: Clear to auscultation bilaterally without wheezes or crackles Cardiovascular: Regular rate and rhythm without murmur gallop or rub Abdomen: Nontender, nondistended, soft, bowel sounds positive, no rebound, no ascites, no appreciable mass Extremities: No significant cyanosis, clubbing, or edema bilateral lower extremities Neuro:  Subtly diminished R nasolabial fold w/ ? Mild droop of R corner of mouth - CN intact B otherwise - A&O - 5/5 strength B U&LE - no babinski  Lab Results:  [facility] 10/01/11 0656 09/30/11 1603  NA 141 139  K 3.5 4.4  CL 109 106  CO2 19 22  GLUCOSE 135* 101*  BUN 16 16  CREATININE 0.88 1.16*  CALCIUM 9.1 9.2  MG -- 1.9  PHOS -- 3.5    Basename 09/30/11 1603  AST 46*  ALT 17  ALKPHOS 106  BILITOT 0.5  PROT 6.4  ALBUMIN 3.1*    Basename 10/01/11 0656 09/30/11 1603  WBC 11.2* 18.4*  NEUTROABS -- --  HGB 11.7* 12.8  HCT 35.8* 39.4  MCV 83.3 84.2  PLT 170 167    Basename 10/01/11 0636 09/30/11 2250 09/30/11 1603  CKTOTAL 690* 1004* 1203*  CKMB 11.0* 14.6* 15.7*  CKMBINDEX -- -- --  TROPONINI <0.30 <0.30  <0.30    Basename 09/30/11 1603  HGBA1C 5.8*    Micro Results: Recent Results (from the past 240 hour(s))  MRSA PCR SCREENING     Status: Normal   Collection Time   09/30/11  6:50 PM      Component Value Range Status Comment   MRSA by PCR NEGATIVE  NEGATIVE  Final     Studies/Results: All recent x-ray/radiology reports have been reviewed in detail.   Medications: I have reviewed the patient's complete medication list.  Assessment/Plan:  PNA - BLL consolidation (L>R) on CT chest + LUL infiltrate Continue empiric medical tx - clinically is slowly improving   afib - ? Hx of chronic afib Currently back in NSR - not on anticoag as outpt - CHADS score is 1 - Cards recommends ASA as CVA prophylaxis  Slurring of speech/facial droop To my exam, there appears to be a decrease of the R nasolabial fold compared to the L - I feel a TIA/CVA eval is indicated - I have ordered carotid dopplers and an MRI/A - an RN stroke swallow screen has also been ordered, with instructions to keep the pt npo unless she passes - will keep on tele   Elevated CK/MB Trop negative and no evidence of ischemia - no further work up per Cards  Multiple drug allergies  RA Stable  at present  Hypothyroid On synthroid tx - TSH acceptable given clinical scenario  HTN Not an active problem at this time  Hx of esophageal stricture w/ GERD  Dispo Transfer to tele bed - ultimate dispo is for SNF - PT/OT to eval with SNF eval as indicated based upon RN swallow screen  Lonia Blood, MD [facility] Office  [phone] Pager [phone]  On-Call/Text Page:      [email or web address]      password [facility]

## 2011-10-01 NOTE — Progress Notes (Signed)
SUBJECTIVE:  Breathing better today.  Still not at baseline.  No chest pain   PHYSICAL EXAM Filed Vitals:   10/01/11 0112 10/01/11 0353 10/01/11 0400 10/01/11 0800  BP:  109/47 110/49 118/55  Pulse:  71 73 71  Temp:  98 F (36.7 C)  98.1 F (36.7 C)  TempSrc:  Oral  Oral  Resp:  23 21 17   Height:      Weight:      SpO2: 93% 94% 94% 94%   General:  No distress Lungs:  Expiratory wheezing Heart:  RRR Abdomen:  Positive bowel sounds, no rebound no guarding Extremities:  No edema.  LABS: Lab Results  Component Value Date   CKTOTAL 690* 10/01/2011   CKMB 11.0* 10/01/2011   TROPONINI <0.30 10/01/2011   Results for orders placed during the hospital encounter of 09/30/11 (from the past 24 hour(s))  COMPREHENSIVE METABOLIC PANEL     Status: Abnormal   Collection Time   09/30/11  4:03 PM      Component Value Range   Sodium 139  135 - 145 (mEq/L)   Potassium 4.4  3.5 - 5.1 (mEq/L)   Chloride 106  96 - 112 (mEq/L)   CO2 22  19 - 32 (mEq/L)   Glucose, Bld 101 (*) 70 - 99 (mg/dL)   BUN 16  6 - 23 (mg/dL)   Creatinine, Ser 7.82 (*) 0.50 - 1.10 (mg/dL)   Calcium 9.2  8.4 - 95.6 (mg/dL)   Total Protein 6.4  6.0 - 8.3 (g/dL)   Albumin 3.1 (*) 3.5 - 5.2 (g/dL)   AST 46 (*) 0 - 37 (U/L)   ALT 17  0 - 35 (U/L)   Alkaline Phosphatase 106  39 - 117 (U/L)   Total Bilirubin 0.5  0.3 - 1.2 (mg/dL)   GFR calc non Af Amer 46 (*) >90 (mL/min)   GFR calc Af Amer 54 (*) >90 (mL/min)  MAGNESIUM     Status: Normal   Collection Time   09/30/11  4:03 PM      Component Value Range   Magnesium 1.9  1.5 - 2.5 (mg/dL)  PHOSPHORUS     Status: Normal   Collection Time   09/30/11  4:03 PM      Component Value Range   Phosphorus 3.5  2.3 - 4.6 (mg/dL)  CBC     Status: Abnormal   Collection Time   09/30/11  4:03 PM      Component Value Range   WBC 18.4 (*) 4.0 - 10.5 (K/uL)   RBC 4.68  3.87 - 5.11 (MIL/uL)   Hemoglobin 12.8  12.0 - 15.0 (g/dL)   HCT 21.3  08.6 - 57.8 (%)   MCV 84.2  78.0 - 100.0 (fL)     MCH 27.4  26.0 - 34.0 (pg)   MCHC 32.5  30.0 - 36.0 (g/dL)   RDW 46.9  62.9 - 52.8 (%)   Platelets 167  150 - 400 (K/uL)  TSH     Status: Normal   Collection Time   09/30/11  4:03 PM      Component Value Range   TSH 1.945  0.350 - 4.500 (uIU/mL)  CARDIAC PANEL(CRET KIN+CKTOT+MB+TROPI)     Status: Abnormal   Collection Time   09/30/11  4:03 PM      Component Value Range   Total CK 1203 (*) 7 - 177 (U/L)   CK, MB 15.7 (*) 0.3 - 4.0 (ng/mL)   Troponin I <0.30  <0.30 (  ng/mL)   Relative Index 1.3  0.0 - 2.5   HEMOGLOBIN A1C     Status: Abnormal   Collection Time   09/30/11  4:03 PM      Component Value Range   Hemoglobin A1C 5.8 (*) <5.7 (%)   Mean Plasma Glucose 120 (*) <117 (mg/dL)  PRO B NATRIURETIC PEPTIDE     Status: Abnormal   Collection Time   09/30/11  4:09 PM      Component Value Range   Pro B Natriuretic peptide (BNP) 1510.0 (*) 0 - 125 (pg/mL)  PROCALCITONIN     Status: Normal   Collection Time   09/30/11  4:10 PM      Component Value Range   Procalcitonin 13.89    LIPID PANEL     Status: Normal   Collection Time   09/30/11  4:10 PM      Component Value Range   Cholesterol 103  0 - 200 (mg/dL)   Triglycerides 82  <161 (mg/dL)   HDL 56  >09 (mg/dL)   Total CHOL/HDL Ratio 1.8     VLDL 16  0 - 40 (mg/dL)   LDL Cholesterol 31  0 - 99 (mg/dL)  MRSA PCR SCREENING     Status: Normal   Collection Time   09/30/11  6:50 PM      Component Value Range   MRSA by PCR NEGATIVE  NEGATIVE   BLOOD GAS, ARTERIAL     Status: Abnormal   Collection Time   09/30/11  7:09 PM      Component Value Range   pH, Arterial 7.467 (*) 7.350 - 7.400    pCO2 arterial 26.4 (*) 35.0 - 45.0 (mmHg)   pO2, Arterial 63.2 (*) 80.0 - 100.0 (mmHg)   Bicarbonate 18.8 (*) 20.0 - 24.0 (mEq/L)   TCO2 19.6  0 - 100 (mmol/L)   Acid-base deficit 4.3 (*) 0.0 - 2.0 (mmol/L)   O2 Saturation 94.0     Patient temperature 98.6     Collection site RIGHT RADIAL     Drawn by [id number]     Sample type ARTERIAL DRAW      Allens test (pass/fail) PASS  PASS   LACTIC ACID, PLASMA     Status: Normal   Collection Time   09/30/11  9:08 PM      Component Value Range   Lactic Acid, Venous 1.5  0.5 - 2.2 (mmol/L)  URINALYSIS, WITH MICROSCOPIC     Status: Abnormal   Collection Time   09/30/11 10:39 PM      Component Value Range   Color, Urine YELLOW  YELLOW    APPearance CLEAR  CLEAR    Specific Gravity, Urine 1.042 (*) 1.005 - 1.030    pH 6.0  5.0 - 8.0    Glucose, UA NEGATIVE  NEGATIVE (mg/dL)   Hgb urine dipstick MODERATE (*) NEGATIVE    Bilirubin Urine NEGATIVE  NEGATIVE    Ketones, ur 15 (*) NEGATIVE (mg/dL)   Protein, ur 30 (*) NEGATIVE (mg/dL)   Urobilinogen, UA 0.2  0.0 - 1.0 (mg/dL)   Nitrite NEGATIVE  NEGATIVE    Leukocytes, UA NEGATIVE  NEGATIVE    WBC, UA 3-6  <3 (WBC/hpf)   RBC / HPF 0-2  <3 (RBC/hpf)   Bacteria, UA RARE  RARE    Squamous Epithelial / LPF FEW (*) RARE    Casts GRANULAR CAST (*) NEGATIVE   CARDIAC PANEL(CRET KIN+CKTOT+MB+TROPI)     Status: Abnormal   Collection Time  09/30/11 10:50 PM      Component Value Range   Total CK 1004 (*) 7 - 177 (U/L)   CK, MB 14.6 (*) 0.3 - 4.0 (ng/mL)   Troponin I <0.30  <0.30 (ng/mL)   Relative Index 1.5  0.0 - 2.5   CARDIAC PANEL(CRET KIN+CKTOT+MB+TROPI)     Status: Abnormal   Collection Time   10/01/11  6:36 AM      Component Value Range   Total CK 690 (*) 7 - 177 (U/L)   CK, MB 11.0 (*) 0.3 - 4.0 (ng/mL)   Troponin I <0.30  <0.30 (ng/mL)   Relative Index 1.6  0.0 - 2.5   BASIC METABOLIC PANEL     Status: Abnormal   Collection Time   10/01/11  6:56 AM      Component Value Range   Sodium 141  135 - 145 (mEq/L)   Potassium 3.5  3.5 - 5.1 (mEq/L)   Chloride 109  96 - 112 (mEq/L)   CO2 19  19 - 32 (mEq/L)   Glucose, Bld 135 (*) 70 - 99 (mg/dL)   BUN 16  6 - 23 (mg/dL)   Creatinine, Ser 6.21  0.50 - 1.10 (mg/dL)   Calcium 9.1  8.4 - 30.8 (mg/dL)   GFR calc non Af Amer 65 (*) >90 (mL/min)   GFR calc Af Amer 75 (*) >90 (mL/min)  CBC      Status: Abnormal   Collection Time   10/01/11  6:56 AM      Component Value Range   WBC 11.2 (*) 4.0 - 10.5 (K/uL)   RBC 4.30  3.87 - 5.11 (MIL/uL)   Hemoglobin 11.7 (*) 12.0 - 15.0 (g/dL)   HCT 65.7 (*) 84.6 - 46.0 (%)   MCV 83.3  78.0 - 100.0 (fL)   MCH 27.2  26.0 - 34.0 (pg)   MCHC 32.7  30.0 - 36.0 (g/dL)   RDW 96.2  95.2 - 84.1 (%)   Platelets 170  150 - 400 (K/uL)    Intake/Output Summary (Last 24 hours) at 10/01/11 0820 Last data filed at 10/01/11 0412  Gross per 24 hour  Intake 434.67 ml  Output    600 ml  Net -165.33 ml    ASSESSMENT AND PLAN:  1) Atrial fibrillation:   NSR. Off of Cardizem.  Keep off PO since the blood pressure is low.  No warfarin.   2)  Elevated CK/MB:  Trop negative and no evidence of ischemia no further work up.  We will see her as needed over the weekend.  OK to transfer to telemetry.    Rollene Rotunda 10/01/2011 8:20 AM

## 2011-10-01 NOTE — Progress Notes (Signed)
Dr. Sharon Seller notified of patient MRI results.

## 2011-10-02 DIAGNOSIS — G459 Transient cerebral ischemic attack, unspecified: Secondary | ICD-10-CM

## 2011-10-02 LAB — CBC
HCT: 35 % — ABNORMAL LOW (ref 36.0–46.0)
MCHC: 32.9 g/dL (ref 30.0–36.0)
Platelets: 171 10*3/uL (ref 150–400)
RDW: 14.3 % (ref 11.5–15.5)
WBC: 9.8 10*3/uL (ref 4.0–10.5)

## 2011-10-02 LAB — URINE CULTURE
Colony Count: NO GROWTH
Culture  Setup Time: 201303081029
Culture: NO GROWTH

## 2011-10-02 LAB — BASIC METABOLIC PANEL
BUN: 20 mg/dL (ref 6–23)
Calcium: 9.3 mg/dL (ref 8.4–10.5)
Chloride: 109 mEq/L (ref 96–112)
Creatinine, Ser: 0.76 mg/dL (ref 0.50–1.10)
GFR calc Af Amer: >90 mL/min (ref >90)

## 2011-10-02 LAB — URINALYSIS, MICROSCOPIC ONLY
Bilirubin Urine: NEGATIVE
Protein, ur: 30 mg/dL — AB
Urobilinogen, UA: 0.2 mg/dL (ref 0.0–1.0)

## 2011-10-02 LAB — HEMOGLOBIN A1C: Mean Plasma Glucose: 117 mg/dL — ABNORMAL HIGH (ref <117)

## 2011-10-02 LAB — HOMOCYSTEINE: Homocysteine: 11.6 umol/L (ref 4.0–15.4)

## 2011-10-02 LAB — GLUCOSE, CAPILLARY: Glucose-Capillary: 253 mg/dL — ABNORMAL HIGH (ref 70–99)

## 2011-10-02 LAB — PROTIME-INR: Prothrombin Time: 13.1 seconds (ref 11.6–15.2)

## 2011-10-02 MED ORDER — COUMADIN BOOK
Freq: Once | Status: DC
  Filled 2011-10-02: qty 1

## 2011-10-02 MED ORDER — AZITHROMYCIN 500 MG PO TABS
500.0000 mg | ORAL_TABLET | Freq: Every day | ORAL | Status: AC
  Administered 2011-10-02 – 2011-10-04 (×3): 500 mg via ORAL
  Filled 2011-10-02 (×4): qty 1

## 2011-10-02 MED ORDER — COUMADIN BOOK
Freq: Once | Status: AC
  Administered 2011-10-02: 13:00:00
  Filled 2011-10-02: qty 1

## 2011-10-02 MED ORDER — PREDNISONE 20 MG PO TABS
20.0000 mg | ORAL_TABLET | Freq: Every day | ORAL | Status: DC
  Administered 2011-10-03 – 2011-10-04 (×2): 20 mg via ORAL
  Filled 2011-10-02 (×3): qty 1

## 2011-10-02 MED ORDER — ASPIRIN 81 MG PO CHEW
81.0000 mg | CHEWABLE_TABLET | Freq: Every day | ORAL | Status: DC
  Administered 2011-10-02: 81 mg via ORAL
  Filled 2011-10-02: qty 1

## 2011-10-02 MED ORDER — WARFARIN SODIUM 2.5 MG PO TABS
2.5000 mg | ORAL_TABLET | Freq: Every day | ORAL | Status: AC
  Administered 2011-10-02: 2.5 mg via ORAL
  Filled 2011-10-02: qty 1

## 2011-10-02 MED ORDER — WARFARIN VIDEO
Freq: Once | Status: AC
  Administered 2011-10-02: 13:00:00

## 2011-10-02 MED ORDER — WARFARIN SODIUM 2.5 MG PO TABS: 2.5000 mg | ORAL_TABLET | Freq: Once | ORAL | Status: DC

## 2011-10-02 MED ORDER — WARFARIN - PHYSICIAN DOSING INPATIENT: Freq: Every day | Status: DC

## 2011-10-02 NOTE — Progress Notes (Signed)
PROGRESS NOTE  Stacey Davenport ZOX:[id number] DOB: 1941-01-27 DOA: 09/30/2011 PCP: No primary provider on file.  Brief narrative: 71 year old female admitted 09/30/11 with pneumonia and possible TIA with slurred speech and confusion  Past medical history: Reflux with history impaired esophageal clearance clearance on manometry 2006, rheumatoid arthritis, asthma, dysrhythmia on diltiazem, shortness of breath, hypothyroidism, hypertension, pneumonia, history likely scan stress 06/2009 EF equal 81%-no heart failure noted.  Consultants:  Cardiology  Procedures:  CT head 3/7 = no acute abnormalities, minimal chronic small vessel ischemic  CT chest 3/7 = no evidence pulmonary embolism, bilateral lower lobe consolidation minimal left upper lobe infiltrate, question mucous plugs bilateral lower lobe rhonchi,? Cirrhotic liver  MRI head = large vessel atherosclerotic type changes, tiny bilateral centrum semiovale acute nonhemorrhagic infarcts  Echocardiogram 3/8 = 65-70% no wall motion abnormalities, grade 1 diastolic dysfunction-small pericardial effusion  Carotid Doppler pending   Antibiotics:  Azithromycin 3/8 >  Zosyn 3/7--3/8  Vancomycin 3/7--3/8   Subjective  Feels well. No nausea no vomiting no chest pain. No fever. Was able to get up to the bathroom today.  Family in room   Objective    Interim History: Chart reviewed, history of present illness reviewed and results reviewed   Objective: Filed Vitals:   10/01/11 2040 10/01/11 2224 10/02/11 0150 10/02/11 0500  BP:  99/55  128/77  Pulse:  74  64  Temp:  97.7 F (36.5 C)  98.2 F (36.8 C)  TempSrc:  Oral  Oral  Resp:  20  18  Height:      Weight:      SpO2: 98% 93% 94% 95%    Intake/Output Summary (Last 24 hours) at 10/02/11 1123 Last data filed at 10/02/11 0836  Gross per 24 hour  Intake   1140 ml  Output      0 ml  Net   1140 ml    Exam:  General: Alert pleasant Caucasian female looking stated age. Slightly  disheveled Cardiovascular: S1-S2 no murmur rub or gallop. or bruit. No JVD. Telemetry shows normal sinus rhythm with no red alarms Respiratory: Clinically clear. No added sound Abdomen: Soft nontender nondistended Skin no lower symmetric Neuro face is symmetric. Uvula is midline. Extraocular movements of eyes intact. No slurred speech. Strength 5 out 5 in upper chest flexors and extensors are 5 out of 5 in hip flexors and knee flexors and extensors  reflexes noted to be decreased in left knee jerk Gait not assessed Finger-nose-finger test negative no pass pointing  Data Reviewed: Basic Metabolic Panel:  Lab 10/02/11 8119 10/01/11 0656 09/30/11 1603  NA 142 141 139  K 3.6 3.5 --  CL 109 109 106  CO2 22 19 22   GLUCOSE 167* 135* 101*  BUN 20 16 16   CREATININE 0.76 0.88 1.16*  CALCIUM 9.3 9.1 9.2  MG -- -- 1.9  PHOS -- -- 3.5   Liver Function Tests:  Lab 09/30/11 1603  AST 46*  ALT 17  ALKPHOS 106  BILITOT 0.5  PROT 6.4  ALBUMIN 3.1*   No results found for this basename: LIPASE:5,AMYLASE:5 in the last 168 hours No results found for this basename: AMMONIA:5 in the last 168 hours CBC:  Lab 10/02/11 0530 10/01/11 0656 09/30/11 1603  WBC 9.8 11.2* 18.4*  NEUTROABS -- -- --  HGB 11.5* 11.7* 12.8  HCT 35.0* 35.8* 39.4  MCV 81.8 83.3 84.2  PLT 171 170 167   Cardiac Enzymes:  Lab 10/01/11 0636 09/30/11 2250 09/30/11 1603  CKTOTAL 690*  1004* 1203*  CKMB 11.0* 14.6* 15.7*  CKMBINDEX -- -- --  TROPONINI <0.30 <0.30 <0.30   BNP: No components found with this basename: POCBNP:5 CBG:  Lab 10/02/11 0740 10/01/11 0804  GLUCAP 148* 139*    Recent Results (from the past 240 hour(s))  MRSA PCR SCREENING     Status: Normal   Collection Time   09/30/11  6:50 PM      Component Value Range Status Comment   MRSA by PCR NEGATIVE  NEGATIVE  Final   CULTURE, BLOOD (ROUTINE X 2)     Status: Normal (Preliminary result)   Collection Time   09/30/11  9:25 PM      Component Value  Range Status Comment   Specimen Description BLOOD RIGHT HAND   Final    Special Requests BOTTLES DRAWN AEROBIC ONLY [facility]   Final    Culture  Setup Time [id number]   Final    Culture     Final    Value:        BLOOD CULTURE RECEIVED NO GROWTH TO DATE CULTURE WILL BE HELD FOR 5 DAYS BEFORE ISSUING A FINAL NEGATIVE REPORT   Report Status PENDING   Incomplete   CULTURE, BLOOD (ROUTINE X 2)     Status: Normal (Preliminary result)   Collection Time   09/30/11  9:40 PM      Component Value Range Status Comment   Specimen Description BLOOD LEFT ARM   Final    Special Requests BOTTLES DRAWN AEROBIC ONLY 8CC   Final    Culture  Setup Time [id number]   Final    Culture     Final    Value:        BLOOD CULTURE RECEIVED NO GROWTH TO DATE CULTURE WILL BE HELD FOR 5 DAYS BEFORE ISSUING A FINAL NEGATIVE REPORT   Report Status PENDING   Incomplete      Studies:              All Imaging reviewed and is as per above notation   Scheduled Meds:   . aspirin  81 mg Oral Daily  . azithromycin  500 mg Intravenous Q24H  . brimonidine  1 drop Both Eyes TID  . calcium-vitamin D  1 tablet Oral Q breakfast  . enoxaparin  40 mg Subcutaneous Q24H  . latanoprost  1 drop Both Eyes QHS  . levalbuterol  0.63 mg Nebulization Q6H  . levothyroxine  50 mcg Oral Q breakfast  . methylPREDNISolone (SOLU-MEDROL) injection  40 mg Intravenous Q12H  . montelukast  10 mg Oral Daily  . pantoprazole  40 mg Oral Q1200  . piperacillin-tazobactam (ZOSYN)  IV  3.375 g Intravenous Q8H  . potassium chloride  10 mEq Oral Daily  . sodium chloride  3 mL Intravenous Q12H  . sodium chloride  3 mL Intravenous Q12H  . vancomycin  1,000 mg Intravenous Q12H  . DISCONTD: albuterol  2.5 mg Nebulization Q6H  . DISCONTD: methylPREDNISolone (SOLU-MEDROL) injection  80 mg Intravenous Q8H   Continuous Infusions:   . DISCONTD: diltiazem (CARDIZEM) infusion Stopped (10/01/11 0105)     Assessment/Plan: 1. Community-acquired pneumonia-will  switch to azithromycin for another 3-4 day period white count trending downwards. Repeat CBC a.m. 2. Atrial fibrillation-Chad score was 1. She has had a TIA which significantly changes things. Her [place] score now would be 3. Given the fact that she has some impaired glucose tolerance she also might even be at a score of 4. Await carotid Dopplers.  Discussed with Dr. Roseanne Reno of neurology who will see her in consult and make final decision regarding anticoagulation  3. TIA-see above 4. Elevated cardiac enzymes-no further workup per cardiology 5. Rheumatoid arthritis-stable at present 6. Hypothyroidis.-Continue Synthroid. 7. History of reflux-continue pantoprazole. 8. ? History of COPD-we will taper Solu-Medrol to prednisone for 3-4 days continue inhalers if empirically helping 9. ? History of diabetes mellitus-patient on Solu-Medrol at present time. Could have impaired glucose tolerance from this. We'll get an A1c. Discontinues steroids eventually and hope that sugars follow suit  Code Status: Full Family Communication: Discussed with family in room. Understand Disposition Plan: In-patient still.  Likely discharge to skilled nursing facility in one to 2 days   Pleas Koch, MD  [facility] Pager [phone] 10/02/2011, 11:23 AM    LOS: 2 days

## 2011-10-02 NOTE — Consult Note (Signed)
Referring Physician: Dr. Mahala Menghini    Chief Complaint: Bilat. Small strokes with atrial fibrillation  HPI: Stacey Davenport is an 71 y.o. female who was admitted on 09/30/2011 shortness of breath and generalized weakness as well as confusion. She was also noted to have slurred speech by her primary care physician, as well as family members. She had no focal facial weakness and no focal numbness. He was also no focal weakness of extremities. Speech is since returned to normal. Is no previous history of stroke nor TIA. There is a history of cardiac dysrhythmia. Patient has been determined to have atrial fibrillation. MRI on 10/01/2011 showed multiple areas of acute non-hemorrhagic cerebral infarctions involving right and left centrum semiovale, most likely of embolic origin from proximal source. Echocardiogram was unremarkable. Lipid panel was unremarkable. Hemoglobin A1c is pending. Carotid Doppler study is also pending. Neurology consultation for stroke prevention. NIH stroke score at this point is 0.  LSN: 09/30/2011, unclear time. tPA Given: No: Beyond time window MRankin: 0  Past Medical History  Diagnosis Date  . Esophageal stricture   . Gastritis   . GERD (gastroesophageal reflux disease)   . Osteoarthritis   . Glaucoma   . Rheumatoid arthritis   . Asthma   . Dysrhythmia     hx of irregular heart beat  . Shortness of breath   . Hypothyroidism   . Hypertension   . Pneumonia     Family History  Problem Relation Age of Onset  . Colon cancer Neg Hx   . Diabetes Mother   . Diabetes Sister      Medications:  Scheduled:   . azithromycin  500 mg Oral Daily  . brimonidine  1 drop Both Eyes TID  . calcium-vitamin D  1 tablet Oral Q breakfast  . coumadin book   Does not apply Once  . enoxaparin  40 mg Subcutaneous Q24H  . latanoprost  1 drop Both Eyes QHS  . levalbuterol  0.63 mg Nebulization Q6H  . levothyroxine  50 mcg Oral Q breakfast  . montelukast  10 mg Oral Daily  .  pantoprazole  40 mg Oral Q1200  . potassium chloride  10 mEq Oral Daily  . predniSONE  20 mg Oral QAC breakfast  . sodium chloride  3 mL Intravenous Q12H  . sodium chloride  3 mL Intravenous Q12H  . warfarin  2.5 mg Oral q1800  . warfarin   Does not apply Once  . Warfarin - Physician Dosing Inpatient   Does not apply q1800  . DISCONTD: albuterol  2.5 mg Nebulization Q6H  . DISCONTD: aspirin  81 mg Oral Daily  . DISCONTD: aspirin  81 mg Oral Daily  . DISCONTD: azithromycin  500 mg Intravenous Q24H  . DISCONTD: coumadin book   Does not apply Once  . DISCONTD: methylPREDNISolone (SOLU-MEDROL) injection  80 mg Intravenous Q8H  . DISCONTD: methylPREDNISolone (SOLU-MEDROL) injection  40 mg Intravenous Q12H  . DISCONTD: piperacillin-tazobactam (ZOSYN)  IV  3.375 g Intravenous Q8H  . DISCONTD: vancomycin  1,000 mg Intravenous Q12H  . DISCONTD: warfarin  2.5 mg Oral ONCE-1800     Physical Examination: Blood pressure 128/77, pulse 64, temperature 98.2 F (36.8 C), temperature source Oral, resp. rate 18, height 5\' 5"  (1.651 m), weight 93.895 kg (207 lb), SpO2 95.00%.  Neurologic Examination: Mental Status: Alert, oriented, thought content appropriate.  Speech fluent without evidence of aphasia. Able to follow commands without difficulty. Cranial Nerves: II-Visual fields were normal. III/IV/VI-Extraocular movements were full and conjugate.  Pupils reacted normally to light. V/VII-no facial numbness and no facial weakness VIII-grossly intact X-normal speech and palatal movement XII-midline tongue extension Motor: 5/5 bilaterally with normal tone and bulk Sensory:  light touch intact throughout, bilaterally Deep Tendon Reflexes: 1+ and symmetric except absent right knee jerk (arthritis with deformity) Plantars: Downgoing bilaterally Cerebellar: Normal finger-to-nose    X-ray Chest Pa And Lateral   09/30/2011  *RADIOLOGY REPORT*  Clinical Data: Shortness of breath, weakness  CHEST - 2 VIEW   Comparison: Chest x-ray of 07/14/2009  Findings: There is abnormal opacity medially at the left lung base. This may represent pneumonia and small effusion, but follow-up chest x-ray is recommended to exclude a left lower lobe lung mass. There also appears to be a small right pleural effusion present. The lungs are hyperaerated.  Mild cardiomegaly is stable.  There is a thoracic kyphosis again noted convex to the right.  IMPRESSION:  1.  Mass-like opacity posteriorly in the left lower lobe.  Possible pneumonia and small effusion but cannot exclude lung mass. Recommend follow-up chest x-ray to ensure clearing. 2.  Small right pleural effusion. 3.  Stable cardiomegaly and thoracic scoliosis.  Original Report Authenticated By: Juline Patch, M.D.   Ct Head Wo Contrast  09/30/2011  *RADIOLOGY REPORT*  Clinical Data: Confusion.  Headache and dizziness.  CT HEAD WITHOUT CONTRAST  Technique:  Contiguous axial images were obtained from the base of the skull through the vertex without contrast.  Comparison: None.  Findings: There is no acute intracranial hemorrhage, infarction, or mass lesion.  There are a few tiny areas of periventricular white matter lucency consistent with chronic small vessel ischemic disease.  There is a small area of cortical thickening of the side of the right frontal bone, probably a small sessile osteoma of no significance.  IMPRESSION: No acute abnormalities.  Minimal chronic small vessel ischemic changes.  Original Report Authenticated By: Gwynn Burly, M.D.   Ct Angio Chest W/cm &/or Wo Cm  09/30/2011  *RADIOLOGY REPORT*  Clinical Data: Hypoxia, shortness of breath, headache, dizziness, atrial fibrillation, question pulmonary embolism  CT ANGIOGRAPHY CHEST  Technique:  Multidetector CT imaging of the chest using the standard protocol during bolus administration of intravenous contrast. Multiplanar reconstructed images including MIPs were obtained and reviewed to evaluate the vascular  anatomy. Hardware failure following initial contrast injection.  The patient was reinjected and examination was completed.  Contrast: OMNIPAQUE IOHEXOL 350 MG/ML IV SOLN  Comparison: None Correlation:  CT abdomen 07/06/2007  Findings: Aorta normal caliber without aneurysm or dissection. Minimal atherosclerotic calcification aorta and coronary arteries. Suboptimal opacification of lower lobe pulmonary arteries. No definite pulmonary emboli identified. Visualized hepatic contours are slightly irregular question cirrhosis, appearance new since prior abdominal CT. Bilateral lower lobe consolidation left greater than right. Fluid or mucus plugs seen within lower lobe bronchi bilaterally greater on left. Scattered tracheobronchial calcification. Minimal infiltrate left upper lobe. No acute osseous abnormalities.  IMPRESSION: No evidence of pulmonary embolism. Bilateral lower lobe consolidation with minimal infiltrate left upper lobe. Fluid or versus mucous plugs in bilateral lower lobe bronchi. Question cirrhotic liver.  Original Report Authenticated By: Lollie Marrow, M.D.   Mr [facility] Wo Contrast  10/01/2011  *RADIOLOGY REPORT*  Clinical Data:  Right-sided facial droop.  Atrial fibrillation. Low blood pressure.  MRI HEAD WITHOUT CONTRAST MRA HEAD WITHOUT CONTRAST  Technique: Multiplanar, multiecho pulse sequences of the brain and surrounding structures were obtained according to standard protocol without intravenous contrast.  Angiographic  images of the head were obtained using MRA technique without contrast.  Comparison: 09/30/2011 CT.  No comparison MR.  MRI HEAD  Findings:  Tiny bilateral centrum semiovale acute non hemorrhagic infarcts.  Prominent small vessel disease type changes.  No intracranial hemorrhage.  No intracranial mass lesion detected on this unenhanced motion degraded exam.  No hydrocephalus.  Small pituitary gland.  IMPRESSION: Tiny bilateral centrum semiovale acute non hemorrhagic infarcts.   Please see above.  MRA HEAD  Findings: No medium or large size vessel significant stenosis or occlusion.  Branch vessel irregularity involving posterior circulation and anterior circulation branch vessels.  No aneurysm or vascular malformation noted.  IMPRESSION: Branch vessel atherosclerotic type changes.  Critical Value/emergent results were called by telephone at the time of interpretation on 10/01/2011 and  at 5:35 p.m.  to  [facility] head nurse.,, who verbally acknowledged these results.  Original Report Authenticated By: Fuller Canada, M.D.   Mr Brain Wo Contrast  10/01/2011  *RADIOLOGY REPORT*  Clinical Data:  Right-sided facial droop.  Atrial fibrillation. Low blood pressure.  MRI HEAD WITHOUT CONTRAST MRA HEAD WITHOUT CONTRAST  Technique: Multiplanar, multiecho pulse sequences of the brain and surrounding structures were obtained according to standard protocol without intravenous contrast.  Angiographic images of the head were obtained using MRA technique without contrast.  Comparison: 09/30/2011 CT.  No comparison MR.  MRI HEAD  Findings:  Tiny bilateral centrum semiovale acute non hemorrhagic infarcts.  Prominent small vessel disease type changes.  No intracranial hemorrhage.  No intracranial mass lesion detected on this unenhanced motion degraded exam.  No hydrocephalus.  Small pituitary gland.  IMPRESSION: Tiny bilateral centrum semiovale acute non hemorrhagic infarcts.  Please see above.  MRA HEAD  Findings: No medium or large size vessel significant stenosis or occlusion.  Branch vessel irregularity involving posterior circulation and anterior circulation branch vessels.  No aneurysm or vascular malformation noted.  IMPRESSION: Branch vessel atherosclerotic type changes.  Critical Value/emergent results were called by telephone at the time of interpretation on 10/01/2011 and  at 5:35 p.m.  to  [facility] head nurse.,, who verbally acknowledged these results.  Original Report Authenticated By: Fuller Canada, M.D.    Assessment: 71 y.o. female with acute bilateral centrum semiovale embolic strokes in the setting of atrial fibrillation. Stroke so most likely embolic from proximal source, most likely cardiac. Current CHADS2 score is 3, with symptomatic cerebrovascular disease.  Stroke Risk Factors - atrial fibrillation and family history  Plan: 1. Hemoglobin A1c, pending 2. Carotid dopplers, pending 3. Prophylactic therapy-Anticoagulation: Coumadin 4. Risk factor modification 5. Telemetry monitoring  C.R. Roseanne Reno, MD [facility] [phone]  10/02/2011, 12:58 PM

## 2011-10-02 NOTE — Progress Notes (Signed)
Physical Therapy Treatment Patient Details Name: Stacey Davenport MRN: [id number] DOB: [birth year] Today's Date: 10/02/2011  PT Assessment/Plan  PT - Assessment/Plan Comments on Treatment Session: Patient was admitted with SOB and a-fib.  Patient performing ambulation much better today with endurance improving. Asked nursing to obtain an incentive spirometer as this PT feels that will help patient with her DOE with activity.  Needed cues for pursed lip breathing.  Continue to recommend NHP.   PT Plan: Discharge plan remains appropriate;Frequency remains appropriate PT Frequency: Min 3X/week Follow Up Recommendations: Skilled nursing facility;Supervision/Assistance - 24 hour Equipment Recommended: Defer to next venue PT Goals  Acute Rehab PT Goals PT Goal: Supine/Side to Sit - Progress: Progressing toward goal PT Goal: Sit to Stand - Progress: Progressing toward goal PT Goal: Ambulate - Progress: Progressing toward goal PT Goal: Perform Home Exercise Program - Progress: Progressing toward goal  PT Treatment Precautions/Restrictions  Precautions Precautions: Fall Required Braces or Orthoses: No Restrictions Weight Bearing Restrictions: No Mobility (including Balance) Bed Mobility Rolling Right: Not tested (comment) Rolling Left: 5: Supervision;With rail Right Sidelying to Sit: Not tested (comment) Left Sidelying to Sit: 5: Supervision;With rails;HOB flat Left Sidelying to Sit Details (indicate cue type and reason): Easier for patient to achieve to EOB today Sitting - Scoot to Edge of Bed: 5: Supervision Transfers Sit to Stand: 4: Min assist;From elevated surface;With upper extremity assist;From bed Sit to Stand Details (indicate cue type and reason): cues for hand placement Stand to Sit: 4: Min assist;With upper extremity assist;With armrests;To chair/3-in-1 Stand to Sit Details: still needed assist to control descent Ambulation/Gait Ambulation/Gait Assistance: 4: Min assist (guard  assist) Ambulation/Gait Assistance Details (indicate cue type and reason): Ambulating with RW with good safety with guard assist Ambulation Distance (Feet): 180 Feet Assistive device: Rolling walker Gait Pattern: Step-through pattern;Decreased stride length;Decreased step length - left;Decreased step length - right Stairs: No Wheelchair Mobility Wheelchair Mobility: No  Posture/Postural Control Posture/Postural Control: No significant limitations Balance Balance Assessed: No Exercise  General Exercises - Lower Extremity Ankle Circles/Pumps: AROM;Both;10 reps;Seated Quad Sets: AROM;Both;10 reps;Seated Long Arc Quad: AROM;Both;10 reps;Seated Hip Flexion/Marching: AROM;Both;10 reps;Seated End of Session PT - End of Session Equipment Utilized During Treatment: Gait belt Activity Tolerance: Patient tolerated treatment well Patient left: in chair;with call bell in reach;with family/visitor present Nurse Communication: Mobility status for transfers;Mobility status for ambulation General Behavior During Session: [facility] for tasks performed Cognition: [facility] for tasks performed  INGOLD,Blaire Hodsdon 10/02/2011, 1:50 PM [facility] Acute Rehabilitation [phone] (pager)

## 2011-10-02 NOTE — Progress Notes (Signed)
VASCULAR LAB PRELIMINARY  PRELIMINARY  PRELIMINARY  PRELIMINARY  Carotid Dopplers completed.    Preliminary report:  There is no ICA stenosis.  Vertebral artery flow is antegrade.  Stacey Davenport, 10/02/2011, 5:16 PM

## 2011-10-02 NOTE — Progress Notes (Signed)
B/o given to Pt and sons.  Family requested additional fax out to [facility].  CSW completed fax to Dorian Pod MSW,LCSW w/e Coverage [phone]

## 2011-10-03 LAB — URINE CULTURE
Culture  Setup Time: 201303092001
Culture: NO GROWTH

## 2011-10-03 MED ORDER — WARFARIN SODIUM 2.5 MG PO TABS
2.5000 mg | ORAL_TABLET | Freq: Every day | ORAL | Status: AC
  Administered 2011-10-03: 2.5 mg via ORAL
  Filled 2011-10-03: qty 1

## 2011-10-03 NOTE — Progress Notes (Signed)
PROGRESS NOTE  Stacey Davenport ZOX:[id number] DOB: 1940-11-05 DOA: 09/30/2011 PCP: No primary provider on file.  Brief narrative: 71 year old female admitted 09/30/11 with pneumonia and possible TIA with slurred speech and confusion  Past medical history: Reflux with history impaired esophageal clearance  on manometry 2006, rheumatoid arthritis, asthma, dysrhythmia on diltiazem, shortness of breath, hypothyroidism, hypertension, pneumonia, history lexiscan stress 06/2009 EF equal 81%-no heart failure noted.  Consultants:  Cardiology  Neurology  Procedures:  CT head 3/7 = no acute abnormalities, minimal chronic small vessel ischemic  CT chest 3/7 = no evidence pulmonary embolism, bilateral lower lobe consolidation minimal left upper lobe infiltrate, question mucous plugs bilateral lower lobe rhonchi,? Cirrhotic liver  MRI head = large vessel atherosclerotic type changes, tiny bilateral centrum semiovale acute nonhemorrhagic infarcts  Echocardiogram 3/8 = 65-70% no wall motion abnormalities, grade 1 diastolic dysfunction-small pericardial effusion Carotid Doppler 3/9 = no ICA stenosis. Vertebral artery flowanterograde  Antibiotics:  Azithromycin 3/8 >  Zosyn 3/7--3/8  Vancomycin 3/7--3/8   Subjective  Feels well. No nausea no vomiting no chest pain. No fever. Coughing status post nebulizations Tolerating by mouth well.   Objective    Interim History: Chart reviewed, history of present illness reviewed and results reviewed   Objective: Filed Vitals:   10/02/11 2043 10/02/11 2100 10/03/11 0600 10/03/11 0742  BP:  123/77 133/62   Pulse:  69 66   Temp:  98.1 F (36.7 C) 97.5 F (36.4 C)   TempSrc:  Oral    Resp:  18 20   Height:      Weight:      SpO2: 96% 95% 93% 94%    Intake/Output Summary (Last 24 hours) at 10/03/11 1004 Last data filed at 10/03/11 0900  Gross per 24 hour  Intake    100 ml  Output    950 ml  Net   -850 ml    Exam:  General: Alert pleasant  Caucasian female looking stated age. Slightly disheveled Cardiovascular: S1-S2 no murmur rub or gallop. or bruit. No JVD. Telemetry shows some tachycardia nonsustained Respiratory: Clinically clear. No added sound Abdomen: Soft nontender nondistended Skin no lower symmetric Neuro face is symmetric. Uvula is midline. Extraocular movements of eyes intact. No slurred speech. Strength 5 out 5 in upper chest flexors and extensors are 5 out of 5 in hip flexors and knee flexors and extensors  reflexes noted to be decreased in left knee jerk Gait not assessed Finger-nose-finger test negative no pass pointing  Data Reviewed: Basic Metabolic Panel:  Lab 10/02/11 8119 10/01/11 0656 09/30/11 1603  NA 142 141 139  K 3.6 3.5 --  CL 109 109 106  CO2 22 19 22   GLUCOSE 167* 135* 101*  BUN 20 16 16   CREATININE 0.76 0.88 1.16*  CALCIUM 9.3 9.1 9.2  MG -- -- 1.9  PHOS -- -- 3.5   Liver Function Tests:  Lab 09/30/11 1603  AST 46*  ALT 17  ALKPHOS 106  BILITOT 0.5  PROT 6.4  ALBUMIN 3.1*   No results found for this basename: LIPASE:5,AMYLASE:5 in the last 168 hours No results found for this basename: AMMONIA:5 in the last 168 hours CBC:  Lab 10/02/11 0530 10/01/11 0656 09/30/11 1603  WBC 9.8 11.2* 18.4*  NEUTROABS -- -- --  HGB 11.5* 11.7* 12.8  HCT 35.0* 35.8* 39.4  MCV 81.8 83.3 84.2  PLT 171 170 167   Cardiac Enzymes:  Lab 10/01/11 0636 09/30/11 2250 09/30/11 1603  CKTOTAL 690* 1004* 1203*  CKMB  11.0* 14.6* 15.7*  CKMBINDEX -- -- --  TROPONINI <0.30 <0.30 <0.30   BNP: No components found with this basename: POCBNP:5 CBG:  Lab 10/03/11 0739 10/02/11 1735 10/02/11 1146 10/02/11 0740 10/01/11 0804  GLUCAP 88 157* 253* 148* 139*    Recent Results (from the past 240 hour(s))  MRSA PCR SCREENING     Status: Normal   Collection Time   09/30/11  6:50 PM      Component Value Range Status Comment   MRSA by PCR NEGATIVE  NEGATIVE  Final   CULTURE, BLOOD (ROUTINE X 2)     Status:  Normal (Preliminary result)   Collection Time   09/30/11  9:25 PM      Component Value Range Status Comment   Specimen Description BLOOD RIGHT HAND   Final    Special Requests BOTTLES DRAWN AEROBIC ONLY [facility]   Final    Culture  Setup Time [id number]   Final    Culture     Final    Value:        BLOOD CULTURE RECEIVED NO GROWTH TO DATE CULTURE WILL BE HELD FOR 5 DAYS BEFORE ISSUING A FINAL NEGATIVE REPORT   Report Status PENDING   Incomplete   CULTURE, BLOOD (ROUTINE X 2)     Status: Normal (Preliminary result)   Collection Time   09/30/11  9:40 PM      Component Value Range Status Comment   Specimen Description BLOOD LEFT ARM   Final    Special Requests BOTTLES DRAWN AEROBIC ONLY 8CC   Final    Culture  Setup Time [id number]   Final    Culture     Final    Value:        BLOOD CULTURE RECEIVED NO GROWTH TO DATE CULTURE WILL BE HELD FOR 5 DAYS BEFORE ISSUING A FINAL NEGATIVE REPORT   Report Status PENDING   Incomplete   URINE CULTURE     Status: Normal   Collection Time   09/30/11 10:40 PM      Component Value Range Status Comment   Specimen Description URINE, CLEAN CATCH   Final    Special Requests NONE   Final    Culture  Setup Time [id number]   Final    Colony Count NO GROWTH   Final    Culture NO GROWTH   Final    Report Status 10/02/2011 FINAL   Final      Studies:              All Imaging reviewed and is as per above notation   Scheduled Meds:    . azithromycin  500 mg Oral Daily  . brimonidine  1 drop Both Eyes TID  . calcium-vitamin D  1 tablet Oral Q breakfast  . coumadin book   Does not apply Once  . enoxaparin  40 mg Subcutaneous Q24H  . latanoprost  1 drop Both Eyes QHS  . levalbuterol  0.63 mg Nebulization Q6H  . levothyroxine  50 mcg Oral Q breakfast  . montelukast  10 mg Oral Daily  . pantoprazole  40 mg Oral Q1200  . potassium chloride  10 mEq Oral Daily  . predniSONE  20 mg Oral QAC breakfast  . sodium chloride  3 mL Intravenous Q12H  . sodium chloride   3 mL Intravenous Q12H  . warfarin  2.5 mg Oral q1800  . warfarin   Does not apply Once  . Warfarin - Physician Dosing Inpatient  Does not apply q1800  . DISCONTD: aspirin  81 mg Oral Daily  . DISCONTD: aspirin  81 mg Oral Daily  . DISCONTD: azithromycin  500 mg Intravenous Q24H  . DISCONTD: coumadin book   Does not apply Once  . DISCONTD: methylPREDNISolone (SOLU-MEDROL) injection  40 mg Intravenous Q12H  . DISCONTD: piperacillin-tazobactam (ZOSYN)  IV  3.375 g Intravenous Q8H  . DISCONTD: vancomycin  1,000 mg Intravenous Q12H  . DISCONTD: warfarin  2.5 mg Oral ONCE-1800   Continuous Infusions:    Assessment/Plan: 1. Community-acquired pneumonia-will switch to azithromycin for another 3-4 day period white count trending downwards.  2. Atrial fibrillation-Chad score was 1. She has had a TIA which significantly changes things. Her [place] score now would be 3. Given the fact that she has some impaired glucose tolerance she also might even be at a score of 4.  carotid Dopplers-3/9 = no ICA stenosis.  Will need an INR in 5 days.   3. TIA-see above 4. Elevated cardiac enzymes-no further workup per cardiology 5. Rheumatoid arthritis-stable at present 6. Hypothyroidis.-Continue Synthroid. 7. History of reflux-continue pantoprazole. 8. ? History of COPD-we will taper Solu-Medrol to prednisone for 3-4 days continue inhalers if empirically helping 9.  History of impaired glucose tolerance-A1c 5.7. Blood sugar range 152-250.  Discontinue checks.  Code Status: Full Family Communication: Discussed with family in room. Understand Disposition Plan: In-patient still.  Likely discharge to skilled nursing facility in one to 2 days   Pleas Koch, MD  [facility] Pager [phone] 10/03/2011, 10:04 AM    LOS: 3 days

## 2011-10-04 ENCOUNTER — Encounter (HOSPITAL_COMMUNITY): Payer: Self-pay | Admitting: Nurse Practitioner

## 2011-10-04 DIAGNOSIS — J189 Pneumonia, unspecified organism: Secondary | ICD-10-CM

## 2011-10-04 DIAGNOSIS — I4891 Unspecified atrial fibrillation: Secondary | ICD-10-CM

## 2011-10-04 LAB — PROTIME-INR
INR: 0.93 (ref 0.00–1.49)
Prothrombin Time: 12.7 seconds (ref 11.6–15.2)

## 2011-10-04 LAB — GLUCOSE, CAPILLARY

## 2011-10-04 MED ORDER — AZITHROMYCIN 500 MG PO TABS: 500.0000 mg | ORAL_TABLET | Freq: Every day | ORAL | Status: AC

## 2011-10-04 MED ORDER — ENOXAPARIN SODIUM 40 MG/0.4ML ~~LOC~~ SOLN: 90.0000 mg | Freq: Two times a day (BID) | SUBCUTANEOUS | Status: DC

## 2011-10-04 MED ORDER — WARFARIN SODIUM 4 MG PO TABS: 4.0000 mg | ORAL_TABLET | Freq: Every day | ORAL | Status: DC

## 2011-10-04 NOTE — Progress Notes (Signed)
Occupational Therapy Treatment Patient Details Name: Jesslynn Kruck MRN: [id number] DOB: Dec 16, 1940 Today's Date: 10/04/2011  OT Assessment/Plan OT Assessment/Plan Follow Up Recommendations: Skilled nursing facility OT Goals ADL Goals ADL Goal: Grooming - Progress: Progressing toward goals ADL Goal: Toilet Transfer - Progress: Progressing toward goals ADL Goal: Toileting - Clothing Manipulation - Progress: Progressing toward goals  OT Treatment      ADL ADL Grooming: Performed;Brushing hair;Minimal assistance Where Assessed - Grooming: Standing at sink Toilet Transfer: Performed;Minimal assistance Toilet Transfer Method: Ambulating;Other (comment) (walker) Toilet Transfer Equipment: Comfort height toilet;Grab bars Toileting - Clothing Manipulation: Performed;Supervision/safety Where Assessed - Glass blower/designer Manipulation: Standing Toileting - Hygiene: Performed;Supervision/safety Where Assessed - Toileting Hygiene: Standing Mobility  Transfers Sit to Stand: 4: Min assist;With upper extremity assist Stand to Sit: 5: Supervision;With upper extremity assist     End of Session OT - End of Session Activity Tolerance: Patient tolerated treatment well Patient left: in chair;with call bell in reach General Behavior During Session: [facility] for tasks performed Cognition: [facility] for tasks performed  [facility]  10/04/2011, 12:32 PM

## 2011-10-04 NOTE — Progress Notes (Signed)
Clinical social worker confirmed with pt and pt family that pt plans to discharge to [facility] later today. Pt son will be providing pt transportation to facility around 1pm, as requested by pt family. CSW provided pt with chart copy and discharge instructions for pt to take to facility. .No further Clinical Social Work needs, signing off.   Catha Gosselin, [place]  [phone] .10/04/2011 11:48am

## 2011-10-04 NOTE — Progress Notes (Signed)
Patient Name: Stacey Davenport Date of Encounter: 10/04/2011     Principal Problem:  *Community acquired pneumonia Active Problems:  Essential hypertension, benign  GERD  Shortness of breath  Arrhythmia  Atrial fibrillation    SUBJECTIVE  Maintaining sinus rhythm.  Breathing improved but not back to baseline.  CURRENT MEDS    . azithromycin  500 mg Oral Daily  . brimonidine  1 drop Both Eyes TID  . calcium-vitamin D  1 tablet Oral Q breakfast  . enoxaparin  40 mg Subcutaneous Q24H  . latanoprost  1 drop Both Eyes QHS  . levalbuterol  0.63 mg Nebulization Q6H  . levothyroxine  50 mcg Oral Q breakfast  . montelukast  10 mg Oral Daily  . pantoprazole  40 mg Oral Q1200  . potassium chloride  10 mEq Oral Daily  . predniSONE  20 mg Oral QAC breakfast  . sodium chloride  3 mL Intravenous Q12H  . sodium chloride  3 mL Intravenous Q12H  . warfarin  2.5 mg Oral q1800  . Warfarin - Physician Dosing Inpatient   Does not apply q1800    OBJECTIVE  Filed Vitals:   10/03/11 2100 10/04/11 0600 10/04/11 0639 10/04/11 0756  BP: 148/60  148/82   Pulse: 73 54    Temp: 97.7 F (36.5 C) 98.3 F (36.8 C)    TempSrc:      Resp: 18 14    Height:      Weight:      SpO2: 94% 93%  96%    Intake/Output Summary (Last 24 hours) at 10/04/11 1118 Last data filed at 10/04/11 0500  Gross per 24 hour  Intake      0 ml  Output   1200 ml  Net  -1200 ml   Filed Weights   09/30/11 1558  Weight: 207 lb (93.895 kg)    PHYSICAL EXAM  General: Pleasant, NAD. Neuro: Alert and oriented X 3. Moves all extremities spontaneously. Psych: Normal affect. HEENT:  Normal  Neck: Supple without bruits or JVD. Lungs:  Resp regular and unlabored, faint insp wheezing in left base. Heart: RRR no s3, s4, or murmurs. Abdomen: Soft, non-tender, non-distended, BS + x 4.  Extremities: No clubbing, cyanosis.  1+ bilat lower ext edema w/ venous stasis changes. DP/PT/Radials 1+ and equal  bilaterally.  Accessory Clinical Findings  CBC  Basename 10/02/11 0530  WBC 9.8  NEUTROABS --  HGB 11.5*  HCT 35.0*  MCV 81.8  PLT 171   Basic Metabolic Panel  Basename 10/02/11 0530  NA 142  K 3.6  CL 109  CO2 22  GLUCOSE 167*  BUN 20  CREATININE 0.76  CALCIUM 9.3  MG --  PHOS --   Hemoglobin A1C  Basename 10/02/11 1307  HGBA1C 5.7*   Lab Results  Component Value Date   TSH 1.945 09/30/2011    TELE  RSR  MRI/A  Tiny bilateral centrum semiovale acute non hemorrhagic infarcts. Branch vessel atherosclerotic type changes.  2D Echocardiogram  Study Conclusions  - Left ventricle: The cavity size was normal. Wall thickness was increased in a pattern of mild LVH. Systolic function was vigorous. The estimated ejection fraction was in the range of 65% to 70%. Wall motion was normal; there were no regional wall motion abnormalities. Doppler parameters are consistent with abnormal left ventricular relaxation (grade 1 diastolic dysfunction). - Mitral valve: Trivial regurgitation. - Tricuspid valve: Trivial regurgitation. - Inferior vena cava: The vessel was dilated. - Pericardium, extracardiac: A small pericardial  effusion was identified circumferential to the heart with evidence of organization anteriorly.  Carotid u/s Bilateral mild soft plaque origin and proximal ICA and ECA. No ICA stenosis. Vertebral artery flow is antegrade.   ASSESSMENT AND PLAN  1.  CAP:  Improving.  Abx, inh, steroids per IM.  2.  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  Agree with initiation of coumadin in setting of afib and embolic infarcts.   3.  PAF:  Resolved.  Likely driven by acute illness.  CHADS2: 3, CHADS2-VASc: 6 (htn, cva,f, age >15, vasc dzs).  Agree with coumadin anticoagulation.  Pt plans to have this followed by Dr. Christell Constant @ [facility].  Signed, Nicolasa Ducking NP

## 2011-10-04 NOTE — Progress Notes (Signed)
Utilization review completed. Stacey Davenport 10/04/2011 

## 2011-10-04 NOTE — Progress Notes (Signed)
Clinical social worker spoke with pt daughter in law regarding bed offers. Pt family is interested in [facility] and [facility]. CSW left message with first choice of [facility] and awaiting approval.    Pt and pt family aware that pt is medically stable for discharge today.   .Clinical social worker continuing to follow pt to assist with pt dc plans and further csw needs.   Catha Gosselin, Theresia Majors  [phone] .10/04/2011 10:56am

## 2011-10-04 NOTE — Progress Notes (Signed)
Clinical social worker had to redo psychosocial assessment yellow note and pt FL2 due to hard copies of these items were not in chart after pt was transferred from 2900. Please see epic notes. .No further Clinical Social Work needs, signing off.   Catha Gosselin, Theresia Majors  [phone] .10/04/2011 12:06pm

## 2011-10-04 NOTE — Discharge Summary (Signed)
[facility] Hospital Discharge Summary  Date of Admission: 09/30/2011 11:19 AM Admitter: @ADMITPROV @   Date of Discharge3/05/2012 Attending Physician: Rhetta Mura, MD  [name] WUJ:[id number] DOB: 03-Dec-1940 DOA: 09/30/2011  PCP: No primary provider on file.   Brief narrative:  71 year old female admitted 09/30/11 with pneumonia and possible TIA with slurred speech. Chest x-ray was done at Dr. Lenice Llamas office in [place] showing questionable pneumonia and as she had not gotten better on antibiotics and had oxygen saturations of 85% range she was directly admitted. There is some wasting of breath last week associated with productive cough subjective fever chills and no specific chest pain. Patient denies any focal episodic findings but did note some weakness although denied headaches and visual changes. She did not have any abdominal pain or any urinary concerns. Please see below for full details  Past medical history:  Reflux with history impaired esophageal clearance on manometry 2006, rheumatoid arthritis, asthma, dysrhythmia on diltiazem, shortness of breath, hypothyroidism, hypertension, pneumonia, history lexiscan stress 06/2009 EF equal 81%-no heart failure noted.   Consultants:  Cardiology  Neurology  Procedures:  CT head 3/7 = no acute abnormalities, minimal chronic small vessel ischemic  CT chest 3/7 = no evidence pulmonary embolism, bilateral lower lobe consolidation minimal left upper lobe infiltrate, question mucous plugs bilateral lower lobe rhonchi,? Cirrhotic liver  MRI head = large vessel atherosclerotic type changes, tiny bilateral centrum semiovale acute nonhemorrhagic infarcts  Echocardiogram 3/8 = 65-70% no wall motion abnormalities, grade 1 diastolic dysfunction-small pericardial effusion Carotid Doppler 3/9 = no ICA stenosis. Vertebral artery flowanterograde   Antibiotics:  Azithromycin 3/8 > 3/12 Zosyn 3/7--3/8  Vancomycin 3/7--3/8  Hospital Course by  problem list:  1. Community-acquired pneumonia-patient was found to have community-acquired pneumonia and was started on IV antibiotic therapy as per above and switched to azithromycin for another 2 day. Her white count on admission was 18.4 and is trended down to 9.8 on 10/02/2011. There was no radial concerns for aspiration, but as she did have a mild TIA will be recommended to followup this x-ray with another x-ray in about 6 weeks' time. 2. Atrial fibrillation-Chad score was 1. Patient was initially kept on a Cardizem drip and this was subsequently discontinued. For unclear reasons her Cardizem was held subsequent to that which will reimplement at Cardizem 180 mg twice a day. She has had a TIA which significantly changes things. Her [place] score now would be 3. Given the fact that she has some impaired glucose tolerance she also might even be at a score of 4. carotid Dopplers-3/9 = no ICA stenosis. Will need an INR in 5 days-as her INR was subtherapeutic during hospitalization in 0.9 range which has been prescribed for to take in a nursing home for 3-5 day period and hopefully her and I will increase. Her dosage of discharge was 4 mg of Coumadin.  She will need Lovenox 90 mg subcutaneously for 3-5 days 3. TIA-given concerns of slurred speech on admission, workup was done which revealed bilateral nonhemorrhagic infarcts. Echocardiogram was done with no specific wall motion abnormalities detected Doppler showed no ICA stenosis. Dr. Roseanne Reno of neurology saw patient and as patient [place] score was 3-4 recommended Coumadin for long-term management. Patient was given education regarding the 4. Elevated cardiac enzymes-no further workup per cardiology-cardiac enzymes are elevated during hospitalization however this was not felt to be of significance given her relatively normal troponin and lack of EKG findings. Her BNP was 1500 and echocardiogram showed grade 1 diastolic dysfunction  5. CHF with EF as above-aged to  continue Cardizem. Patient was on lisinopril which she has not needed for blood pressure control. Will likely need to reimplement this and possibly small dose beta blocker in the outpatient 6. Rheumatoid arthritis-stable at present 7. Hypothyroidis.-Continue Synthroid. 8. History of reflux-continue pantoprazole. 9. ? History of COPD-we will taper Solu-Medrol to prednisone for 3-4 days continue inhalers if empirically helping 10. History of impaired glucose tolerance-A1c 5.7. Blood sugar range 152-250. Discontinue checks, would not start her on any anti-glycemic agents at present   Procedures Performed and pertinent labs: X-ray Chest Pa And Lateral   09/30/2011  *RADIOLOGY REPORT*  Clinical Data: Shortness of breath, weakness  CHEST - 2 VIEW  Comparison: Chest x-ray of 07/14/2009  Findings: There is abnormal opacity medially at the left lung base. This may represent pneumonia and small effusion, but follow-up chest x-ray is recommended to exclude a left lower lobe lung mass. There also appears to be a small right pleural effusion present. The lungs are hyperaerated.  Mild cardiomegaly is stable.  There is a thoracic kyphosis again noted convex to the right.  IMPRESSION:  1.  Mass-like opacity posteriorly in the left lower lobe.  Possible pneumonia and small effusion but cannot exclude lung mass. Recommend follow-up chest x-ray to ensure clearing. 2.  Small right pleural effusion. 3.  Stable cardiomegaly and thoracic scoliosis.  Original Report Authenticated By: Juline Patch, M.D.   Ct Head Wo Contrast  09/30/2011  *RADIOLOGY REPORT*  Clinical Data: Confusion.  Headache and dizziness.  CT HEAD WITHOUT CONTRAST  Technique:  Contiguous axial images were obtained from the base of the skull through the vertex without contrast.  Comparison: None.  Findings: There is no acute intracranial hemorrhage, infarction, or mass lesion.  There are a few tiny areas of periventricular white matter lucency consistent with  chronic small vessel ischemic disease.  There is a small area of cortical thickening of the side of the right frontal bone, probably a small sessile osteoma of no significance.  IMPRESSION: No acute abnormalities.  Minimal chronic small vessel ischemic changes.  Original Report Authenticated By: Gwynn Burly, M.D.   Ct Angio Chest W/cm &/or Wo Cm  09/30/2011  *RADIOLOGY REPORT*  Clinical Data: Hypoxia, shortness of breath, headache, dizziness, atrial fibrillation, question pulmonary embolism  CT ANGIOGRAPHY CHEST  Technique:  Multidetector CT imaging of the chest using the standard protocol during bolus administration of intravenous contrast. Multiplanar reconstructed images including MIPs were obtained and reviewed to evaluate the vascular anatomy. Hardware failure following initial contrast injection.  The patient was reinjected and examination was completed.  Contrast: OMNIPAQUE IOHEXOL 350 MG/ML IV SOLN  Comparison: None Correlation:  CT abdomen 07/06/2007  Findings: Aorta normal caliber without aneurysm or dissection. Minimal atherosclerotic calcification aorta and coronary arteries. Suboptimal opacification of lower lobe pulmonary arteries. No definite pulmonary emboli identified. Visualized hepatic contours are slightly irregular question cirrhosis, appearance new since prior abdominal CT. Bilateral lower lobe consolidation left greater than right. Fluid or mucus plugs seen within lower lobe bronchi bilaterally greater on left. Scattered tracheobronchial calcification. Minimal infiltrate left upper lobe. No acute osseous abnormalities.  IMPRESSION: No evidence of pulmonary embolism. Bilateral lower lobe consolidation with minimal infiltrate left upper lobe. Fluid or versus mucous plugs in bilateral lower lobe bronchi. Question cirrhotic liver.  Original Report Authenticated By: Lollie Marrow, M.D.   Mr [facility] Wo Contrast  10/01/2011  *RADIOLOGY REPORT*  Clinical Data:  Right-sided facial  droop.  Atrial fibrillation. Low blood pressure.  MRI HEAD WITHOUT CONTRAST MRA HEAD WITHOUT CONTRAST  Technique: Multiplanar, multiecho pulse sequences of the brain and surrounding structures were obtained according to standard protocol without intravenous contrast.  Angiographic images of the head were obtained using MRA technique without contrast.  Comparison: 09/30/2011 CT.  No comparison MR.  MRI HEAD  Findings:  Tiny bilateral centrum semiovale acute non hemorrhagic infarcts.  Prominent small vessel disease type changes.  No intracranial hemorrhage.  No intracranial mass lesion detected on this unenhanced motion degraded exam.  No hydrocephalus.  Small pituitary gland.  IMPRESSION: Tiny bilateral centrum semiovale acute non hemorrhagic infarcts.  Please see above.  MRA HEAD  Findings: No medium or large size vessel significant stenosis or occlusion.  Branch vessel irregularity involving posterior circulation and anterior circulation branch vessels.  No aneurysm or vascular malformation noted.  IMPRESSION: Branch vessel atherosclerotic type changes.  Critical Value/emergent results were called by telephone at the time of interpretation on 10/01/2011 and  at 5:35 p.m.  to  [facility] head nurse.,, who verbally acknowledged these results.  Original Report Authenticated By: Fuller Canada, M.D.   Mr Brain Wo Contrast  10/01/2011  *RADIOLOGY REPORT*  Clinical Data:  Right-sided facial droop.  Atrial fibrillation. Low blood pressure.  MRI HEAD WITHOUT CONTRAST MRA HEAD WITHOUT CONTRAST  Technique: Multiplanar, multiecho pulse sequences of the brain and surrounding structures were obtained according to standard protocol without intravenous contrast.  Angiographic images of the head were obtained using MRA technique without contrast.  Comparison: 09/30/2011 CT.  No comparison MR.  MRI HEAD  Findings:  Tiny bilateral centrum semiovale acute non hemorrhagic infarcts.  Prominent small vessel disease type changes.  No  intracranial hemorrhage.  No intracranial mass lesion detected on this unenhanced motion degraded exam.  No hydrocephalus.  Small pituitary gland.  IMPRESSION: Tiny bilateral centrum semiovale acute non hemorrhagic infarcts.  Please see above.  MRA HEAD  Findings: No medium or large size vessel significant stenosis or occlusion.  Branch vessel irregularity involving posterior circulation and anterior circulation branch vessels.  No aneurysm or vascular malformation noted.  IMPRESSION: Branch vessel atherosclerotic type changes.  Critical Value/emergent results were called by telephone at the time of interpretation on 10/01/2011 and  at 5:35 p.m.  to  [facility] head nurse.,, who verbally acknowledged these results.  Original Report Authenticated By: Fuller Canada, M.D.    Discharge Vitals & PE:  BP 148/82  Pulse 54  Temp(Src) 98.3 F (36.8 C) (Oral)  Resp 14  Ht 5\' 5"  (1.651 m)  Wt 93.895 kg (207 lb)  BMI 34.45 kg/m2  SpO2 96%  Alert, oriented smiling Chest clinically clear no added sound S1-S2 no murmur rub or gallop telemetry normal sinus rhythm with no red alarms and some mild bradycardia. Abdomen soft nontender nondistended Neurologically intact-finger-nose-finger test respective strength was 5 out of 5 in all extremities sensation was intact  Discharge Labs:  Results for orders placed during the hospital encounter of 09/30/11 (from the past 24 hour(s))  PROTIME-INR     Status: Normal   Collection Time   10/04/11  7:00 AM      Component Value Range   Prothrombin Time 12.7  11.6 - 15.2 (seconds)   INR 0.93  0.00 - 1.49   GLUCOSE, CAPILLARY     Status: Normal   Collection Time   10/04/11  7:13 AM      Component Value Range   Glucose-Capillary 78  70 -  99 (mg/dL)   Comment 1 Notify RN      Disposition and follow-up:   Ms.Stacey Davenport was discharged from in good condition.    Follow-up Appointments:  Follow-up with Family Doctor and Lab work Needed INR and Bmet in 3-5  days  Discharge Medications: Medication List  As of 10/04/2011 11:08 AM   STOP taking these medications         acetaminophen 650 MG CR tablet      aspirin 81 MG EC tablet      lisinopril 5 MG tablet      predniSONE 5 MG tablet      traMADol 50 MG tablet         TAKE these medications         azithromycin 500 MG tablet   Commonly known as: ZITHROMAX   Take 1 tablet (500 mg total) by mouth daily.      brimonidine 0.2 % ophthalmic solution   Commonly known as: ALPHAGAN   Place 1 drop into both eyes 3 (three) times daily.      calcium-vitamin D 250-125 MG-UNIT per tablet   Commonly known as: OSCAL   Take 1 tablet by mouth daily.      cholecalciferol 1000 UNITS tablet   Commonly known as: VITAMIN D   Take 2,000 Units by mouth daily.      ciclesonide 160 MCG/ACT inhaler   Commonly known as: ALVESCO   Inhale 1 puff into the lungs 2 (two) times daily.      DEXILANT 60 MG capsule   Generic drug: dexlansoprazole   Take 60 mg by mouth daily.      diltiazem 180 MG 24 hr capsule   Commonly known as: CARDIZEM CD   Take 180 mg by mouth 2 (two) times daily.      enoxaparin 40 MG/0.4ML Soln   Commonly known as: LOVENOX   Inject 0.9 mLs (90 mg total) into the skin every 12 (twelve) hours.      guaiFENesin 200 MG tablet   Take 200 mg by mouth 2 (two) times daily as needed. For congestion      latanoprost 0.005 % ophthalmic solution   Commonly known as: XALATAN   Place 1 drop into both eyes at bedtime.      levothyroxine 50 MCG tablet   Commonly known as: SYNTHROID, LEVOTHROID   Take 50 mcg by mouth daily.      loratadine 10 MG tablet   Commonly known as: CLARITIN   Take 10 mg by mouth daily.      LORazepam 0.5 MG tablet   Commonly known as: ATIVAN   Take 0.5-1 mg by mouth daily as needed. For anxiety      montelukast 10 MG tablet   Commonly known as: SINGULAIR   Take 10 mg by mouth daily.      One-A-Day Weight Smart Advance Tabs   Take 1 tablet by mouth daily.       potassium chloride 10 MEQ tablet   Commonly known as: K-DUR,KLOR-CON   Take 10 mEq by mouth daily.      warfarin 4 MG tablet   Commonly known as: COUMADIN   Take 1 tablet (4 mg total) by mouth daily.      XOPENEX HFA 45 MCG/ACT inhaler   Generic drug: levalbuterol   Inhale 2 puffs into the lungs 2 (two) times daily.           Medications Discontinued During This Encounter  Medication Reason  .  calcium-vitamin D (OSCAL) 250-125 MG-UNIT per tablet 1 tablet Formulary change  . ciclesonide (ALVESCO) 160 MCG/ACT inhaler 1 puff Formulary change  . predniSONE (DELTASONE) tablet 5 mg Duplicate  . lisinopril (PRINIVIL,ZESTRIL) tablet 5 mg   . loratadine (CLARITIN) tablet 10 mg   . cefTRIAXone (ROCEPHIN) 1 g in dextrose 5 % 50 mL IVPB   . diltiazem (CARDIZEM CD) 24 hr capsule 180 mg   . fluticasone (FLOVENT HFA) 110 MCG/ACT inhaler 1 puff   . levalbuterol (XOPENEX HFA) inhaler 2 puff   . methylPREDNISolone sodium succinate (SOLU-MEDROL) 125 MG injection 80 mg   . enoxaparin (LOVENOX) injection 30 mg   . vancomycin (VANCOCIN) 750 mg in sodium chloride 0.9 % 150 mL IVPB   . albuterol (PROVENTIL) (5 MG/ML) 0.5% nebulizer solution 2.5 mg   . diltiazem (CARDIZEM) 100 mg in dextrose 5 % 100 mL infusion   . diltiazem (CARDIZEM) 1 mg/mL load via infusion 10 mg   . methylPREDNISolone sodium succinate (SOLU-MEDROL) 125 MG injection 80 mg   . aspirin EC tablet 81 mg   . traMADol (ULTRAM) tablet 50 mg   . azithromycin (ZITHROMAX) 500 mg in dextrose 5 % 250 mL IVPB   . coumadin book   . piperacillin-tazobactam (ZOSYN) IVPB 3.375 g   . vancomycin (VANCOCIN) IVPB 1000 mg/200 mL premix   . warfarin (COUMADIN) tablet 2.5 mg   . methylPREDNISolone sodium succinate (SOLU-MEDROL) 40 MG injection 40 mg   . aspirin chewable tablet 81 mg   . lisinopril (PRINIVIL,ZESTRIL) 5 MG tablet Stop Taking at Discharge  . acetaminophen (TYLENOL) 650 MG CR tablet Stop Taking at Discharge  . traMADol (ULTRAM)  50 MG tablet Stop Taking at Discharge  . predniSONE (DELTASONE) 5 MG tablet Stop Taking at Discharge  . aspirin 81 MG EC tablet Stop Taking at Discharge    Signed: Wendy Mikles,JAI 10/04/2011, 10:50 AM

## 2011-10-07 LAB — CULTURE, BLOOD (ROUTINE X 2): Culture  Setup Time: 201303080237

## 2011-11-03 ENCOUNTER — Encounter: Payer: Self-pay | Admitting: Cardiology

## 2011-11-03 ENCOUNTER — Ambulatory Visit (INDEPENDENT_AMBULATORY_CARE_PROVIDER_SITE_OTHER): Payer: Medicare Other | Admitting: Cardiology

## 2011-11-03 VITALS — BP 140/80 | HR 60 | Ht 65.0 in | Wt 185.0 lb

## 2011-11-03 DIAGNOSIS — I10 Essential (primary) hypertension: Secondary | ICD-10-CM

## 2011-11-03 DIAGNOSIS — I4891 Unspecified atrial fibrillation: Secondary | ICD-10-CM

## 2011-11-03 DIAGNOSIS — Z8679 Personal history of other diseases of the circulatory system: Secondary | ICD-10-CM

## 2011-11-03 NOTE — Assessment & Plan Note (Signed)
The patient  tolerates this rhythm and rate control and anticoagulation. We will continue with the meds as listed.  I will check a Holter for rate control at the next visit.

## 2011-11-03 NOTE — Patient Instructions (Signed)
The current medical regimen is effective;  continue present plan and medications.  Follow up with Dr Antoine Poche in 3 months in the [place] office

## 2011-11-03 NOTE — Assessment & Plan Note (Signed)
The blood pressure is at target. No change in medications is indicated. We will continue with therapeutic lifestyle changes (TLC).  

## 2011-11-03 NOTE — Progress Notes (Signed)
HPI The patient presents for followup after recent hospitalization with pneumonia and TIA.Stacey Davenport She was also noted to have diastolic heart failure and atrial fibrillation. She has been treated with rate control and anticoagulation.  Of note an echocardiogram demonstrated an EF of 70% with a small pericardial effusion and some diastolic dysfunction. She had no carotid stenosis on Doppler.  Since discharge she has had PT at rehab in [place].  She is now back home.  Since discharge he has done relatively well.  She denies any palpitations, presyncope or syncope. She has had no chest pressure or shortness of breath. She does get around with a walker being limited by arthritis.  Allergies  Allergen Reactions  . Cephalexin   . Clarithromycin Hives and Itching  . Esomeprazole Magnesium   . Hydrocodone   . Hydrocodone-Acetaminophen   . Latex   . Moxifloxacin   . Rabeprazole Sodium   . Sulfonamide Derivatives     Current Outpatient Prescriptions  Medication Sig Dispense Refill  . brimonidine (ALPHAGAN) 0.2 % ophthalmic solution Place 1 drop into both eyes 3 (three) times daily.       . calcium-vitamin D (OSCAL) 250-125 MG-UNIT per tablet Take 1 tablet by mouth daily.        . cholecalciferol (VITAMIN D) 1000 UNITS tablet Take 2,000 Units by mouth daily.      . ciclesonide (ALVESCO) 160 MCG/ACT inhaler Inhale 1 puff into the lungs 2 (two) times daily.        Stacey Davenport dexlansoprazole (DEXILANT) 60 MG capsule Take 60 mg by mouth daily.        Stacey Davenport diltiazem (CARDIZEM CD) 180 MG 24 hr capsule Take 180 mg by mouth 2 (two) times daily.        Stacey Davenport latanoprost (XALATAN) 0.005 % ophthalmic solution Place 1 drop into both eyes at bedtime.        . levalbuterol (XOPENEX HFA) 45 MCG/ACT inhaler Inhale 2 puffs into the lungs 2 (two) times daily.       Stacey Davenport levothyroxine (SYNTHROID, LEVOTHROID) 50 MCG tablet Take 50 mcg by mouth daily.        Stacey Davenport loratadine (CLARITIN) 10 MG tablet Take 10 mg by mouth daily.        Stacey Davenport LORazepam  (ATIVAN) 0.5 MG tablet Take 0.5-1 mg by mouth daily as needed. For anxiety      . montelukast (SINGULAIR) 10 MG tablet Take 10 mg by mouth daily.        . Multiple Vitamins-Minerals (ONE-A-DAY WEIGHT SMART ADVANCE) TABS Take 1 tablet by mouth daily.        . potassium chloride (K-DUR,KLOR-CON) 10 MEQ tablet Take 10 mEq by mouth daily.        Stacey Davenport warfarin (COUMADIN) 4 MG tablet Take 1 tablet (4 mg total) by mouth daily.  5 tablet  0    Past Medical History  Diagnosis Date  . Esophageal stricture   . Gastritis   . GERD (gastroesophageal reflux disease)   . Osteoarthritis   . Glaucoma   . Rheumatoid arthritis   . Asthma   . Dysrhythmia     hx of irregular heart beat  . Shortness of breath   . Hypothyroidism   . Hypertension   . Pneumonia   . 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 Atrial fibrillation     Past Surgical History  Procedure Date  . Tubal ligation   .  Appendectomy   . Right eye lid surgery   . Bilateral cataract extraction   . Cholecystectomy     ROS:  As stated in the HPI and negative for all other systems.  PHYSICAL EXAM BP 140/80  Pulse 60  Ht 5\' 5"  (1.651 m)  Wt 185 lb (83.915 kg)  BMI 30.79 kg/m2 GENERAL:  Well appearing HEENT:  Pupils equal round and reactive, fundi not visualized, oral mucosa unremarkable NECK:  No jugular venous distention, waveform within normal limits, carotid upstroke brisk and symmetric, no bruits, no thyromegaly LYMPHATICS:  No cervical, inguinal adenopathy LUNGS:  Clear to auscultation bilaterally BACK:  No CVA tenderness CHEST:  Unremarkable HEART:  PMI not displaced or sustained,S1 and S2 within normal limits, no S3, no clicks, no rubs, no murmurs, irregular ABD:  Flat, positive bowel sounds normal in frequency in pitch, no bruits, no rebound, no guarding, no midline pulsatile mass, no hepatomegaly, no splenomegaly EXT:  2 plus pulses throughout, no edema, no  cyanosis no clubbing SKIN:  No rashes no nodules NEURO:  Cranial nerves II through XII grossly intact, motor grossly intact throughout [employer]:  Cognitively intact, oriented to person place and time  EKG:  Atrial fibrillation, rate 101, axis within normal limits, intervals within normal limits, no acute ST-T wave 11/03/2011   ASSESSMENT AND PLAN

## 2011-11-03 NOTE — Assessment & Plan Note (Signed)
She had some nondiagnostic enzyme elevation in the hospital but since has had no recurrent chest pain. No further cardiovascular testing is suggested. I have reviewed all available hospital records.

## 2011-11-08 ENCOUNTER — Ambulatory Visit: Payer: PRIVATE HEALTH INSURANCE | Admitting: Physical Therapy

## 2011-11-17 ENCOUNTER — Institutional Professional Consult (permissible substitution): Payer: PRIVATE HEALTH INSURANCE | Admitting: Cardiology

## 2012-01-26 ENCOUNTER — Ambulatory Visit (INDEPENDENT_AMBULATORY_CARE_PROVIDER_SITE_OTHER): Payer: PRIVATE HEALTH INSURANCE | Admitting: Cardiology

## 2012-01-26 ENCOUNTER — Encounter: Payer: Self-pay | Admitting: Cardiology

## 2012-01-26 VITALS — BP 130/80 | HR 70 | Ht 65.0 in | Wt 192.0 lb

## 2012-01-26 DIAGNOSIS — I10 Essential (primary) hypertension: Secondary | ICD-10-CM

## 2012-01-26 DIAGNOSIS — I4891 Unspecified atrial fibrillation: Secondary | ICD-10-CM

## 2012-01-26 MED ORDER — AMIODARONE HCL 200 MG PO TABS: 400.0000 mg | ORAL_TABLET | Freq: Two times a day (BID) | ORAL | Status: DC

## 2012-01-26 NOTE — Patient Instructions (Addendum)
Please start Amiodarone 200 mg tablets take 2 tablets twice a day for 2 weeks then decrease to 2 tablets once a day Continue all other medications as listed.  Follow up in 6 weeks in [place] office

## 2012-01-26 NOTE — Progress Notes (Signed)
HPI The patient presents for followup.  She was hospitlized this year with pneumonia and TIA.Stacey Davenport She was also noted to have diastolic heart failure and atrial fibrillation. She has been treated with rate control and anticoagulation.  Of note an echocardiogram demonstrated an EF of 70% with a small pericardial effusion and some diastolic dysfunction. She had no carotid stenosis on Doppler.    Since I last saw her she has had increasing palpitations. She called to move her scheduled appointment. She feels her heart racing. This might last for 3 hours. She will feel short of breath with this. She might get lightheaded with this. She's not had any presyncope or syncope. She has had no new chest pressure, neck or arm discomfort. She's had no weight gain or edema. She gets around with her walker. She said this is similar to what she had in the hospital with fibrillation though less severe.   Allergies  Allergen Reactions  . Cephalexin   . Clarithromycin Hives and Itching  . Esomeprazole Magnesium   . Hydrocodone   . Hydrocodone-Acetaminophen   . Latex   . Moxifloxacin   . Rabeprazole Sodium   . Sulfonamide Derivatives     Current Outpatient Prescriptions  Medication Sig Dispense Refill  . brimonidine (ALPHAGAN) 0.2 % ophthalmic solution Place 1 drop into both eyes 3 (three) times daily.       . calcium-vitamin D (OSCAL) 250-125 MG-UNIT per tablet Take 1 tablet by mouth daily.        . cholecalciferol (VITAMIN D) 1000 UNITS tablet Take 2,000 Units by mouth daily.      . ciclesonide (ALVESCO) 160 MCG/ACT inhaler Inhale 1 puff into the lungs 2 (two) times daily.        Stacey Davenport dexlansoprazole (DEXILANT) 60 MG capsule Take 60 mg by mouth daily.        Stacey Davenport diltiazem (CARDIZEM CD) 180 MG 24 hr capsule Take 180 mg by mouth 2 (two) times daily.        Stacey Davenport latanoprost (XALATAN) 0.005 % ophthalmic solution Place 1 drop into both eyes at bedtime.        . levalbuterol (XOPENEX HFA) 45 MCG/ACT inhaler Inhale 2  puffs into the lungs 2 (two) times daily.       Stacey Davenport levothyroxine (SYNTHROID, LEVOTHROID) 50 MCG tablet Take 50 mcg by mouth daily.        Stacey Davenport loratadine (CLARITIN) 10 MG tablet Take 10 mg by mouth daily.        Stacey Davenport LORazepam (ATIVAN) 0.5 MG tablet Take 0.5-1 mg by mouth daily as needed. For anxiety      . montelukast (SINGULAIR) 10 MG tablet Take 10 mg by mouth daily.        . Multiple Vitamins-Minerals (ONE-A-DAY WEIGHT SMART ADVANCE) TABS Take 1 tablet by mouth daily.        . potassium chloride (K-DUR,KLOR-CON) 10 MEQ tablet Take 10 mEq by mouth daily.        Stacey Davenport warfarin (COUMADIN) 4 MG tablet Take 1 tablet (4 mg total) by mouth daily.  5 tablet  0    Past Medical History  Diagnosis Date  . Esophageal stricture   . Gastritis   . GERD (gastroesophageal reflux disease)   . Osteoarthritis   . Glaucoma   . Rheumatoid arthritis   . Asthma   . Dysrhythmia     hx of irregular heart beat  . Shortness of breath   . Hypothyroidism   . Hypertension   .  Pneumonia   . 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 Atrial fibrillation     Past Surgical History  Procedure Date  . Tubal ligation   . Appendectomy   . Right eye lid surgery   . Bilateral cataract extraction   . Cholecystectomy     ROS:  As stated in the HPI and negative for all other systems.  PHYSICAL EXAM BP 130/80  Pulse 70  Ht 5\' 5"  (1.651 m)  Wt 192 lb (87.091 kg)  BMI 31.95 kg/m2 GENERAL:  Well appearing NECK:  No jugular venous distention, waveform within normal limits, carotid upstroke brisk and symmetric, no bruits, no thyromegaly LUNGS:  Clear to auscultation bilaterally BACK:  No CVA tenderness CHEST:  Unremarkable HEART:  PMI not displaced or sustained,S1 and S2 within normal limits, no S3, no clicks, no rubs, no murmurs, irregular ABD:  Flat, positive bowel sounds normal in frequency in pitch, no bruits, no rebound, no guarding, no midline  pulsatile mass, no hepatomegaly, no splenomegaly EXT:  2 plus pulses throughout, no edema, no cyanosis no clubbing SKIN:  No rashes no nodules NEURO:  Cranial nerves II through XII grossly intact, motor grossly intact throughout PSYCH:  Cognitively intact, oriented to person place and time  EKG:  Normal sinus rhythm, rate 70, axis within normal limits, intervals within normal limits, poor anterior R wave progression, no acute ST-T wave changes.  01/26/2012   ASSESSMENT AND PLAN

## 2012-01-26 NOTE — Assessment & Plan Note (Signed)
I think the patient had a paroxysms of fibrillation. I'm going to start her on amiodarone 400 mg twice a day for then 400 mg daily then finally 200 mg daily. We will notify the Coumadin clinic. She did have a normal TSH and liver enzymes recently. I will all of these.

## 2012-01-26 NOTE — Assessment & Plan Note (Signed)
The blood pressure is at target. No change in medications is indicated. We will continue with therapeutic lifestyle changes (TLC).  

## 2012-01-31 ENCOUNTER — Telehealth: Payer: Self-pay | Admitting: Cardiology

## 2012-01-31 NOTE — Telephone Encounter (Signed)
Patient called was told Dr.Hochrein advises to see how you do on amiodarone 200 mg daily.Patient advised to call back if not better.

## 2012-01-31 NOTE — Telephone Encounter (Signed)
Let's see how she does on the lower dose

## 2012-01-31 NOTE — Telephone Encounter (Signed)
Patient called stated she just started on amiodarone last week.States yesterday 01/30/12 she decreased to 200 mg once a day.States it is causing her to be tired and more sob.Patient was told Dr.Hochrein out of office today will forward message to him for advice.

## 2012-01-31 NOTE — Telephone Encounter (Signed)
New msg Pt wants to talk to you about her meds. She wants to discuss changing amiodarone. Its making her extremely tired

## 2012-02-09 ENCOUNTER — Ambulatory Visit: Payer: PRIVATE HEALTH INSURANCE | Admitting: Cardiology

## 2012-02-10 ENCOUNTER — Telehealth: Payer: Self-pay | Admitting: Cardiology

## 2012-02-10 NOTE — Telephone Encounter (Signed)
New msg Pt said she is having some side effects taking amiodarone. It is making her very tired. Please call

## 2012-02-10 NOTE — Telephone Encounter (Signed)
Per pt - very fatigued "just can't get passed it"  She states she feels like the medications are just too much" because she is old and weak.   Reports that she can only do about 45 mins of housework and then she has to take a long rest.  She doesn't feel as though she has had much improvement in her s/s since decreasing the dose.

## 2012-02-11 NOTE — Telephone Encounter (Signed)
The patient can stop the warfarin.

## 2012-02-11 NOTE — Telephone Encounter (Signed)
Patient called was told Dr.Hochrein advised may stop warfarin.Patient stated she was afraid to stop warfarin.States she will continue and discuss at next appointment 03/01/12.

## 2012-03-01 ENCOUNTER — Encounter: Payer: Self-pay | Admitting: Cardiology

## 2012-03-01 ENCOUNTER — Ambulatory Visit (INDEPENDENT_AMBULATORY_CARE_PROVIDER_SITE_OTHER): Payer: PRIVATE HEALTH INSURANCE | Admitting: Cardiology

## 2012-03-01 VITALS — BP 115/60 | HR 75 | Ht 64.0 in | Wt 193.0 lb

## 2012-03-01 DIAGNOSIS — I4891 Unspecified atrial fibrillation: Secondary | ICD-10-CM

## 2012-03-01 DIAGNOSIS — I10 Essential (primary) hypertension: Secondary | ICD-10-CM

## 2012-03-01 MED ORDER — WARFARIN SODIUM 5 MG PO TABS: 5.0000 mg | ORAL_TABLET | Freq: Every day | ORAL | Status: DC

## 2012-03-01 MED ORDER — WARFARIN SODIUM 4 MG PO TABS: 4.0000 mg | ORAL_TABLET | Freq: Every day | ORAL | Status: DC

## 2012-03-01 NOTE — Progress Notes (Signed)
HPI The patient presents for followup of her atrial fibrillation. She has had this documented previously. Recently she was having long 3 hour episodes of this. Because of this I started amiodarone. However, she felt weak and uncomfortable at 400 mg twice a day. Initially I thought about stopping the medicine. She agreed to a reduced dose and has actually been taking 200 in the morning and 100 in the evening. She's been tolerating this although she thinks her vision has been a little bit blurred. She's not having the weakness that she was having. In addition she's not having the tachycardia arrhythmias to nearly the degree that she was. She has some rare episodes that she thinks is her fibrillation but is no longer sustained area she's not had any presyncope or syncope. She's not having any chest pressure, neck or arm discomfort. She denies any PND or orthopnea.   Allergies  Allergen Reactions  . Cephalexin   . Clarithromycin Hives and Itching  . Esomeprazole Magnesium   . Hydrocodone   . Hydrocodone-Acetaminophen   . Latex   . Moxifloxacin   . Rabeprazole Sodium   . Sulfonamide Derivatives     Current Outpatient Prescriptions  Medication Sig Dispense Refill  . amiodarone (PACERONE) 200 MG tablet Take 2 tablets (400 mg total) by mouth 2 (two) times daily.  84 tablet  6  . brimonidine (ALPHAGAN) 0.2 % ophthalmic solution Place 1 drop into both eyes 3 (three) times daily.       . calcium-vitamin D (OSCAL) 250-125 MG-UNIT per tablet Take 1 tablet by mouth daily.        . cholecalciferol (VITAMIN D) 1000 UNITS tablet Take 2,000 Units by mouth daily.      . ciclesonide (ALVESCO) 160 MCG/ACT inhaler Inhale 1 puff into the lungs 2 (two) times daily.        Marland Kitchen dexlansoprazole (DEXILANT) 60 MG capsule Take 60 mg by mouth daily.        Marland Kitchen diltiazem (CARDIZEM CD) 180 MG 24 hr capsule Take 180 mg by mouth 2 (two) times daily.        Marland Kitchen latanoprost (XALATAN) 0.005 % ophthalmic solution Place 1 drop  into both eyes at bedtime.        . levalbuterol (XOPENEX HFA) 45 MCG/ACT inhaler Inhale 2 puffs into the lungs 2 (two) times daily.       Marland Kitchen levothyroxine (SYNTHROID, LEVOTHROID) 50 MCG tablet Take 50 mcg by mouth daily.        Marland Kitchen loratadine (CLARITIN) 10 MG tablet Take 10 mg by mouth daily.        Marland Kitchen LORazepam (ATIVAN) 0.5 MG tablet Take 0.5-1 mg by mouth daily as needed. For anxiety      . montelukast (SINGULAIR) 10 MG tablet Take 10 mg by mouth daily.        . Multiple Vitamins-Minerals (ONE-A-DAY WEIGHT SMART ADVANCE) TABS Take 1 tablet by mouth daily.        . potassium chloride (K-DUR,KLOR-CON) 10 MEQ tablet Take 10 mEq by mouth daily.          Past Medical History  Diagnosis Date  . Esophageal stricture   . Gastritis   . GERD (gastroesophageal reflux disease)   . Osteoarthritis   . Glaucoma   . Rheumatoid arthritis   . Asthma   . Dysrhythmia     hx of irregular heart beat  . Shortness of breath   . Hypothyroidism   . Hypertension   . Pneumonia   .  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 Atrial fibrillation     Past Surgical History  Procedure Date  . Tubal ligation   . Appendectomy   . Right eye lid surgery   . Bilateral cataract extraction   . Cholecystectomy     ROS:  As stated in the HPI and negative for all other systems.  PHYSICAL EXAM BP 115/60  Pulse 75  Ht 5\' 4"  (1.626 m)  Wt 193 lb (87.544 kg)  BMI 33.13 kg/m2 GENERAL:  Well appearing NECK:  No jugular venous distention, waveform within normal limits, carotid upstroke brisk and symmetric, no bruits, no thyromegaly LUNGS:  Clear to auscultation bilaterally BACK:  No CVA tenderness CHEST:  Unremarkable HEART:  PMI not displaced or sustained,S1 and S2 within normal limits, no S3, no clicks, no rubs, no murmurs, irregular ABD:  Flat, positive bowel sounds normal in frequency in pitch, no bruits, no rebound, no guarding, no midline  pulsatile mass, no hepatomegaly, no splenomegaly EXT:  2 plus pulses throughout, no edema, no cyanosis no clubbing SKIN:  No rashes no nodules NEURO:  Cranial nerves II through XII grossly intact, motor grossly intact throughout PSYCH:  Cognitively intact, oriented to person place and time  ASSESSMENT AND PLAN  Atrial fibrillation -  At this point she will continue with amiodarone at 200 mg daily. If she has symptoms related to this in the future I will stop and possibly switch to Tikosyn.  Of note she was taken off of warfarin her and this will be restarted. She will follow with the Coumadin clinic and her primary care office.   Essential hypertension, benign -  The blood pressure is at target. No change in medications is indicated. We will continue with therapeutic lifestyle changes (TLC).

## 2012-03-01 NOTE — Patient Instructions (Addendum)
Restart Warfarin 4 mg a day Have follow up PT/INR in 5 days (approximately)  Tuesday August 13 at 3 pm Take Amiodarone 200 mg a day  Continue all other medications as listed  Follow up in 4 months with Dr Antoine Poche

## 2012-04-20 ENCOUNTER — Observation Stay (HOSPITAL_COMMUNITY)
Admission: EM | Admit: 2012-04-20 | Discharge: 2012-04-21 | Disposition: A | Discharge status: Home / Self Care | Payer: Medicare Other | Attending: Internal Medicine | Admitting: Internal Medicine

## 2012-04-20 ENCOUNTER — Emergency Department (HOSPITAL_COMMUNITY): Payer: Medicare Other

## 2012-04-20 ENCOUNTER — Encounter (HOSPITAL_COMMUNITY): Payer: Self-pay | Admitting: Neurology

## 2012-04-20 DIAGNOSIS — J45909 Unspecified asthma, uncomplicated: Secondary | ICD-10-CM | POA: Diagnosis present

## 2012-04-20 DIAGNOSIS — R0602 Shortness of breath: Secondary | ICD-10-CM | POA: Insufficient documentation

## 2012-04-20 DIAGNOSIS — K219 Gastro-esophageal reflux disease without esophagitis: Secondary | ICD-10-CM | POA: Diagnosis present

## 2012-04-20 DIAGNOSIS — M069 Rheumatoid arthritis, unspecified: Secondary | ICD-10-CM | POA: Diagnosis present

## 2012-04-20 DIAGNOSIS — I10 Essential (primary) hypertension: Secondary | ICD-10-CM | POA: Diagnosis present

## 2012-04-20 DIAGNOSIS — R110 Nausea: Secondary | ICD-10-CM

## 2012-04-20 DIAGNOSIS — R109 Unspecified abdominal pain: Secondary | ICD-10-CM | POA: Insufficient documentation

## 2012-04-20 DIAGNOSIS — R0789 Other chest pain: Secondary | ICD-10-CM

## 2012-04-20 DIAGNOSIS — I4891 Unspecified atrial fibrillation: Secondary | ICD-10-CM | POA: Diagnosis present

## 2012-04-20 DIAGNOSIS — R197 Diarrhea, unspecified: Secondary | ICD-10-CM | POA: Insufficient documentation

## 2012-04-20 DIAGNOSIS — M940 Chondrocostal junction syndrome [Tietze]: Secondary | ICD-10-CM | POA: Insufficient documentation

## 2012-04-20 DIAGNOSIS — R079 Chest pain, unspecified: Principal | ICD-10-CM

## 2012-04-20 DIAGNOSIS — Z7901 Long term (current) use of anticoagulants: Secondary | ICD-10-CM | POA: Insufficient documentation

## 2012-04-20 DIAGNOSIS — R0989 Other specified symptoms and signs involving the circulatory and respiratory systems: Secondary | ICD-10-CM | POA: Insufficient documentation

## 2012-04-20 DIAGNOSIS — E039 Hypothyroidism, unspecified: Secondary | ICD-10-CM | POA: Insufficient documentation

## 2012-04-20 DIAGNOSIS — Z79899 Other long term (current) drug therapy: Secondary | ICD-10-CM | POA: Insufficient documentation

## 2012-04-20 DIAGNOSIS — R0609 Other forms of dyspnea: Secondary | ICD-10-CM | POA: Insufficient documentation

## 2012-04-20 LAB — CBC WITH DIFFERENTIAL/PLATELET
Basophils Absolute: 0 10*3/uL (ref 0.0–0.1)
Basophils Relative: 1 % (ref 0–1)
Eosinophils Relative: 1 % (ref 0–5)
HCT: 43.5 % (ref 36.0–46.0)
Lymphocytes Relative: 20 % (ref 12–46)
MCHC: 32.4 g/dL (ref 30.0–36.0)
Monocytes Absolute: 0.3 10*3/uL (ref 0.1–1.0)
Neutro Abs: 5.1 10*3/uL (ref 1.7–7.7)
Platelets: 187 10*3/uL (ref 150–400)
RDW: 18.3 % — ABNORMAL HIGH (ref 11.5–15.5)
WBC: 6.9 10*3/uL (ref 4.0–10.5)

## 2012-04-20 LAB — COMPREHENSIVE METABOLIC PANEL
ALT: 14 U/L (ref 0–35)
AST: 22 U/L (ref 0–37)
Albumin: 4 g/dL (ref 3.5–5.2)
CO2: 20 mEq/L (ref 19–32)
Calcium: 9.4 mg/dL (ref 8.4–10.5)
Creatinine, Ser: 0.72 mg/dL (ref 0.50–1.10)
Sodium: 141 mEq/L (ref 135–145)

## 2012-04-20 LAB — TROPONIN I
Troponin I: <0.3 ng/mL (ref <0.30)
Troponin I: <0.3 ng/mL (ref <0.30)

## 2012-04-20 LAB — URINE MICROSCOPIC-ADD ON

## 2012-04-20 LAB — URINALYSIS, ROUTINE W REFLEX MICROSCOPIC
Nitrite: NEGATIVE
Protein, ur: NEGATIVE mg/dL
Urobilinogen, UA: 0.2 mg/dL (ref 0.0–1.0)

## 2012-04-20 LAB — PROTIME-INR: INR: 1.92 — ABNORMAL HIGH (ref 0.00–1.49)

## 2012-04-20 MED ORDER — CICLESONIDE 160 MCG/ACT IN AERS: 1.0000 | INHALATION_SPRAY | Freq: Two times a day (BID) | RESPIRATORY_TRACT | Status: DC

## 2012-04-20 MED ORDER — WARFARIN SODIUM 5 MG PO TABS
5.0000 mg | ORAL_TABLET | Freq: Once | ORAL | Status: AC
  Administered 2012-04-20: 5 mg via ORAL
  Filled 2012-04-20: qty 1

## 2012-04-20 MED ORDER — SODIUM CHLORIDE 0.9 % IJ SOLN: 3.0000 mL | INTRAMUSCULAR | Status: DC | PRN

## 2012-04-20 MED ORDER — ASPIRIN 81 MG PO CHEW
243.0000 mg | CHEWABLE_TABLET | Freq: Once | ORAL | Status: AC
  Administered 2012-04-20: 243 mg via ORAL
  Filled 2012-04-20: qty 3

## 2012-04-20 MED ORDER — SODIUM CHLORIDE 0.9 % IJ SOLN
3.0000 mL | Freq: Two times a day (BID) | INTRAMUSCULAR | Status: DC
  Administered 2012-04-20 – 2012-04-21 (×2): 3 mL via INTRAVENOUS

## 2012-04-20 MED ORDER — AMIODARONE HCL 200 MG PO TABS
200.0000 mg | ORAL_TABLET | Freq: Every day | ORAL | Status: DC
  Administered 2012-04-21: 200 mg via ORAL
  Filled 2012-04-20: qty 1

## 2012-04-20 MED ORDER — LEVOTHYROXINE SODIUM 50 MCG PO TABS
50.0000 ug | ORAL_TABLET | Freq: Every day | ORAL | Status: DC
  Administered 2012-04-21: 50 ug via ORAL
  Filled 2012-04-20 (×2): qty 1

## 2012-04-20 MED ORDER — KETOROLAC TROMETHAMINE 30 MG/ML IJ SOLN: 30.0000 mg | Freq: Four times a day (QID) | INTRAMUSCULAR | Status: DC | PRN

## 2012-04-20 MED ORDER — ADULT MULTIVITAMIN W/MINERALS CH
1.0000 | ORAL_TABLET | Freq: Every day | ORAL | Status: DC
  Administered 2012-04-21: 1 via ORAL
  Filled 2012-04-20: qty 1

## 2012-04-20 MED ORDER — ONDANSETRON HCL 4 MG/2ML IJ SOLN: 4.0000 mg | Freq: Four times a day (QID) | INTRAMUSCULAR | Status: DC | PRN

## 2012-04-20 MED ORDER — LEVALBUTEROL TARTRATE 45 MCG/ACT IN AERO
2.0000 | INHALATION_SPRAY | Freq: Two times a day (BID) | RESPIRATORY_TRACT | Status: DC
Start: 2012-04-20 — End: 2012-04-21
  Administered 2012-04-21: 2 via RESPIRATORY_TRACT
  Filled 2012-04-20: qty 15

## 2012-04-20 MED ORDER — CALCIUM CARBONATE-VITAMIN D 500-200 MG-UNIT PO TABS
1.0000 | ORAL_TABLET | Freq: Every day | ORAL | Status: DC
  Administered 2012-04-21: 1 via ORAL
  Filled 2012-04-20: qty 1

## 2012-04-20 MED ORDER — LATANOPROST 0.005 % OP SOLN
1.0000 [drp] | Freq: Every day | OPHTHALMIC | Status: DC
  Administered 2012-04-20: 1 [drp] via OPHTHALMIC
  Filled 2012-04-20: qty 2.5

## 2012-04-20 MED ORDER — NITROGLYCERIN 0.4 MG SL SUBL: 0.4000 mg | SUBLINGUAL_TABLET | SUBLINGUAL | Status: DC | PRN

## 2012-04-20 MED ORDER — ONDANSETRON HCL 4 MG/2ML IJ SOLN
4.0000 mg | Freq: Once | INTRAMUSCULAR | Status: AC
  Administered 2012-04-20: 4 mg via INTRAVENOUS
  Filled 2012-04-20: qty 2

## 2012-04-20 MED ORDER — WARFARIN - PHARMACIST DOSING INPATIENT: Freq: Every day | Status: DC

## 2012-04-20 MED ORDER — LORATADINE 10 MG PO TABS
10.0000 mg | ORAL_TABLET | Freq: Every day | ORAL | Status: DC
  Administered 2012-04-21: 10 mg via ORAL
  Filled 2012-04-20: qty 1

## 2012-04-20 MED ORDER — FLUTICASONE PROPIONATE HFA 220 MCG/ACT IN AERO
1.0000 | INHALATION_SPRAY | Freq: Two times a day (BID) | RESPIRATORY_TRACT | Status: DC
  Administered 2012-04-21: 1 via RESPIRATORY_TRACT
  Filled 2012-04-20: qty 12

## 2012-04-20 MED ORDER — DILTIAZEM HCL ER COATED BEADS 180 MG PO CP24
180.0000 mg | ORAL_CAPSULE | Freq: Two times a day (BID) | ORAL | Status: DC
  Administered 2012-04-21: 180 mg via ORAL
  Filled 2012-04-20 (×3): qty 1

## 2012-04-20 MED ORDER — ONDANSETRON HCL 4 MG PO TABS: 4.0000 mg | ORAL_TABLET | Freq: Four times a day (QID) | ORAL | Status: DC | PRN

## 2012-04-20 MED ORDER — ACETAMINOPHEN 325 MG PO TABS
650.0000 mg | ORAL_TABLET | Freq: Four times a day (QID) | ORAL | Status: DC | PRN
  Administered 2012-04-20: 650 mg via ORAL
  Filled 2012-04-20: qty 2

## 2012-04-20 MED ORDER — BRIMONIDINE TARTRATE 0.2 % OP SOLN
1.0000 [drp] | Freq: Three times a day (TID) | OPHTHALMIC | Status: DC
  Administered 2012-04-20: 1 [drp] via OPHTHALMIC
  Filled 2012-04-20: qty 5

## 2012-04-20 MED ORDER — SODIUM CHLORIDE 0.9 % IJ SOLN
3.0000 mL | Freq: Two times a day (BID) | INTRAMUSCULAR | Status: DC
  Administered 2012-04-20: 3 mL via INTRAVENOUS

## 2012-04-20 MED ORDER — VITAMIN D3 25 MCG (1000 UNIT) PO TABS
2000.0000 [IU] | ORAL_TABLET | Freq: Every day | ORAL | Status: DC
  Administered 2012-04-21: 2000 [IU] via ORAL
  Filled 2012-04-20: qty 2

## 2012-04-20 MED ORDER — LORAZEPAM 0.5 MG PO TABS: 0.5000 mg | ORAL_TABLET | Freq: Every day | ORAL | Status: DC | PRN

## 2012-04-20 MED ORDER — SODIUM CHLORIDE 0.9 % IV SOLN: 250.0000 mL | INTRAVENOUS | Status: DC | PRN

## 2012-04-20 MED ORDER — POTASSIUM CHLORIDE CRYS ER 10 MEQ PO TBCR
10.0000 meq | EXTENDED_RELEASE_TABLET | Freq: Every day | ORAL | Status: DC
  Administered 2012-04-21: 10 meq via ORAL
  Filled 2012-04-20: qty 1

## 2012-04-20 MED ORDER — PANTOPRAZOLE SODIUM 40 MG PO TBEC
80.0000 mg | DELAYED_RELEASE_TABLET | Freq: Every day | ORAL | Status: DC
  Administered 2012-04-21: 80 mg via ORAL
  Filled 2012-04-20: qty 2

## 2012-04-20 MED ORDER — MONTELUKAST SODIUM 10 MG PO TABS
10.0000 mg | ORAL_TABLET | Freq: Every day | ORAL | Status: DC
  Administered 2012-04-21: 10 mg via ORAL
  Filled 2012-04-20: qty 1

## 2012-04-20 NOTE — H&P (Signed)
Hospital Admission Note Date: 04/20/2012  Patient name: Stacey Davenport Medical record number: [id number] Date of birth: 11/23/1940 Age: 71 y.o. Gender: female PCP: Rudi Heap, MD  Internal Medicine Teaching Service  Attending physician:  Dr. Criselda Peaches    Internal Medicine Teaching Service Contact Information  1st Contact: Dow Adolph, MD  Pager:[phone] 2nd Contact:  Dede Query, MD              [phone]  After 5 pm or weekends: 1st Contact: Pager: [phone] 2nd Contact: Pager: [phone]   Chief Complaint: Chest pressure  History of Present Illness:  Stacey Davenport is a 71 yo F with a history of paroxysmal atrial fibrillation on coumadin, GERD, CVA 09/2011, hypothyroidism, HTN, who presents with several days of dyspnea, chest pressure, nausea, and fatigue. She states that over the past several days she has had shortness of breath doing normal activities and chores at home. She awoke from sleep last night with acute dyspnea and had to sit up and catch her breath before going back to sleep. She has had worsening excessive fatigue that is unusual for her. Around 11am on day of admission, she began to feel chest pressure, like someone sitting on her chest. Associated with nausea, no vomiting. She states it is 8/10 at it's worst, 5/10 currently, constant, not episodic. Location is substernal, non radiating. No palpitations. Patient states that she began to feel anxious about her condition, checked her blood pressure and was concerned that it was 184 systolic and it is usually in the 130's. She made an appointment with her PCP where she was given nitroglycerin, ASA and got an EKG. NTG seemed to alleviate her pain partially.  ROS + for diarrhea the past 2 days, mild abdominal pain, chills, 2-3 pillow orthopnea (at baseline), worsening of GERD symptoms recently. She has also had urinary symptoms of increased frequency and irritation since Sunday. Was given a Rx for antibiotics but did not fill it until  today. States that her "nerves" are acting up with concern for her health related to this SOB and chest pressure.  No recent travel, no fever, no cough.  No smoking history.  Meds:   Medication List     As of 04/20/2012 10:15 PM    ASK your doctor about these medications         amiodarone 200 MG tablet   Commonly known as: PACERONE   Take 200 mg by mouth daily.      brimonidine 0.2 % ophthalmic solution   Commonly known as: ALPHAGAN   Place 1 drop into both eyes 3 (three) times daily.      calcium-vitamin D 250-125 MG-UNIT per tablet   Commonly known as: OSCAL   Take 1 tablet by mouth daily.      cholecalciferol 1000 UNITS tablet   Commonly known as: VITAMIN D   Take 2,000 Units by mouth daily.      ciclesonide 160 MCG/ACT inhaler   Commonly known as: ALVESCO   Inhale 1 puff into the lungs 2 (two) times daily.      DEXILANT 60 MG capsule   Generic drug: dexlansoprazole   Take 60 mg by mouth daily.      diltiazem 180 MG 24 hr capsule   Commonly known as: CARDIZEM CD   Take 180 mg by mouth 2 (two) times daily.      latanoprost 0.005 % ophthalmic solution   Commonly known as: XALATAN   Place 1 drop into both eyes at bedtime.  levothyroxine 50 MCG tablet   Commonly known as: SYNTHROID, LEVOTHROID   Take 50 mcg by mouth daily.      loratadine 10 MG tablet   Commonly known as: CLARITIN   Take 10 mg by mouth daily.      LORazepam 0.5 MG tablet   Commonly known as: ATIVAN   Take 0.5-1 mg by mouth daily as needed. For anxiety      montelukast 10 MG tablet   Commonly known as: SINGULAIR   Take 10 mg by mouth daily.      One-A-Day Weight Smart Advance Tabs   Take 1 tablet by mouth daily.      potassium chloride 10 MEQ tablet   Commonly known as: K-DUR,KLOR-CON   Take 10 mEq by mouth daily.      warfarin 4 MG tablet   Commonly known as: COUMADIN   Take 4 mg by mouth daily.      XOPENEX HFA 45 MCG/ACT inhaler   Generic drug: levalbuterol   Inhale 2  puffs into the lungs 2 (two) times daily.        Allergies: Allergies as of 04/20/2012 - Review Complete 04/20/2012  Allergen Reaction Noted  . Cephalexin    . Clarithromycin Hives and Itching   . Esomeprazole magnesium    . Hydrocodone  04/17/2009  . Hydrocodone-acetaminophen    . Ketek (telithromycin)  04/20/2012  . Latex    . Levaquin (levofloxacin in d5w)  04/20/2012  . Lipitor (atorvastatin)  04/20/2012  . Moxifloxacin  01/21/2010  . Rabeprazole sodium    . Sulfonamide derivatives     Past Medical History  Diagnosis Date  . Esophageal stricture   . Gastritis   . GERD (gastroesophageal reflux disease)   . Osteoarthritis   . Glaucoma   . Rheumatoid arthritis   . Asthma   . Dysrhythmia     hx of irregular heart beat  . Shortness of breath   . Hypothyroidism   . Hypertension   . Pneumonia   . 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 Atrial fibrillation    Past Surgical History  Procedure Date  . Tubal ligation   . Appendectomy   . Right eye lid surgery   . Bilateral cataract extraction   . Cholecystectomy    Family History  Problem Relation Age of Onset  . Colon cancer Neg Hx   . Diabetes Mother   . Diabetes Sister    History   Social History  . Marital Status: Divorced    Spouse Name: N/A    Number of Children: N/A  . Years of Education: N/A   Occupational History  . Not on file.   Social History Main Topics  . Smoking status: Never Smoker   . Smokeless tobacco: Never Used  . Alcohol Use: No  . Drug Use: No  . Sexually Active: No   Other Topics Concern  . Not on file   Social History Narrative   Retired.     Review of Systems: Pertinent items noted in HPI   Physical Exam Blood pressure 150/89, pulse 63, temperature 97.8 F (36.6 C), temperature source Oral, resp. rate 17, SpO2 97.00%. General:  Obese, no acute distress, alert and oriented x 3, well-appearing    HEENT:  PERRL, EOMI, moist mucous membranes Cardiovascular:  Regular rate and rhythm, no murmurs, no carotid bruit Chest: sternum is tender to palpation Respiratory:  Clear  to auscultation bilaterally, no wheezes, rales, or rhonchi Abdomen:  Soft, nondistended, slightly tender in epigastric region, positive bowel sounds Extremities:  Warm and well-perfused, no clubbing, cyanosis, or edema.  Skin: Warm, dry, no rashes Neuro: Appears anxious  Lab results: Basic Metabolic Panel:  Basename 04/20/12 1618  NA 141  K 3.9  CL 106  CO2 20  GLUCOSE 97  BUN 10  CREATININE 0.72  CALCIUM 9.4  MG --  PHOS --   Liver Function Tests:  [facility] 04/20/12 1618  AST 22  ALT 14  ALKPHOS 64  BILITOT 0.3  PROT 7.1  ALBUMIN 4.0   CBC:  Basename 04/20/12 1618  WBC 6.9  NEUTROABS 5.1  HGB 14.1  HCT 43.5  MCV 83.8  PLT 187   Cardiac Enzymes:  Basename 04/20/12 1916 04/20/12 1618  CKTOTAL -- --  CKMB -- --  CKMBINDEX -- --  TROPONINI <0.30 <0.30   Coagulation:  Basename 04/20/12 1618  LABPROT 21.2*  INR 1.92*   Urinalysis:  Basename 04/20/12 1605  COLORURINE YELLOW  LABSPEC 1.008  PHURINE 7.0  GLUCOSEU NEGATIVE  HGBUR SMALL*  BILIRUBINUR NEGATIVE  KETONESUR NEGATIVE  PROTEINUR NEGATIVE  UROBILINOGEN 0.2  NITRITE NEGATIVE  LEUKOCYTESUR NEGATIVE     Imaging results:  Dg Chest 2 View  04/20/2012  *RADIOLOGY REPORT*  Clinical Data: Chest pain, shortness of breath.  CHEST - 2 VIEW  Comparison: 09/30/2011  Findings: There is hyperinflation of the lungs compatible with COPD.  Mild cardiomegaly.  No confluent airspace opacities or effusions.  No acute bony abnormality.  IMPRESSION: COPD.  Mild cardiomegaly.  No acute findings.   Original Report Authenticated By: Cyndie Chime, M.D.      Assessment & Plan by Problem: Principal Problem:  *Acute chest pain Active Problems:  Essential hypertension, benign  ASTHMA  GERD  Rheumatoid arthritis  Atrial  fibrillation  Ms. Gahn is a 71 yo F with a history of paroxysmal atrial fibrillation, GERD, CVA 09/2011, hypothyroidism, HTN, who presents with several days of dyspnea and chest pressure that began this morning.      1) Acute chest pain: Patient with acute chest pain and shortness of breath. Has history of A. fib and CVA along with hypertension.   Differential includes ACS, Anxiety, PNA, A. fib with RVR (less likely since no palpitations and EKG/telemetry not supportive ), esophageal spasm/GERD, musculoskeletal, asthma.      Chest x-ray negative in ED for fluid overload or infiltrates.   Vital stable and no leukocytosis or fever.   EKG shows normal sinus rhythm with no new ST-T wave abnormalities in troponin x2 negative in ED.      Pain reproducible with palpation.    No previous CAD history, never had a cardiac cath. No history of diabetes or hyperlipidemia- last LDL 31 in March 2013.      - Admit to telemetry bed.   - Cycle troponins x3 total in 12 hours.   - Nitrostat when necessary of and Toradol IV when necessary for pain.   - Zofran PRN for nausea and vomiting .      2) GERD/esophageal stricture : Patient has history of esophageal stricture per previous EGD's- last one in September 2010. Patient does have recent worsening of GERD symptoms.   - Patient on PPIs. Continue while in hospital.      3) History of A. fib : Patient with paroxysmal symptomatic A. fib - which is followed by Dr. Antoine Poche with cardiology - who last saw patient  in August 2013.   - Patient on Coumadin (although mildly subtherapeutic on admission- INR 1.92) and amiodarone - continue while in hospital.      4) Hypertension : Continue Cardizem       5) Asthma : Well controlled asthma for last 2 years per patient.   On multiple medications and inhalers.   -Xopenex, Alvesco inhalers and Singulair.    - Also on antihistamines- Claritin.      6) Hypothyroidism : Last TSH 1.945 in March 2013.   - Continue  Synthroid at 50 mcg daily.      7) DVT prophylaxis: Coumadin.      Signed: Denton Ar 04/20/2012, 10:15 PM

## 2012-04-20 NOTE — ED Notes (Signed)
Meal tray ordered 

## 2012-04-20 NOTE — ED Notes (Signed)
Per ems- Was at family medicine today, report sob x 1 day. C/o chest pressure. EKG unremarkable. Given 81 mg aspirin, 1 nitro. A x 4. 159/87, HR 81 AFIB. Skin warm and dry. C/o nausea x 1 week. Pt reporting has known UTI.

## 2012-04-20 NOTE — ED Notes (Signed)
Pt eating dinner tray. Family at bedside 

## 2012-04-20 NOTE — Progress Notes (Signed)
ANTICOAGULATION CONSULT NOTE - Initial Consult  Pharmacy Consult for warfarin Indication: atrial fibrillation  Allergies  Allergen Reactions  . Cephalexin   . Clarithromycin Hives and Itching  . Esomeprazole Magnesium   . Hydrocodone   . Hydrocodone-Acetaminophen   . Ketek (Telithromycin)   . Latex   . Levaquin (Levofloxacin In D5w)   . Lipitor (Atorvastatin)   . Moxifloxacin   . Rabeprazole Sodium   . Sulfonamide Derivatives    Vital Signs: Temp: 97.8 F (36.6 C) (09/26 1540) Temp src: Oral (09/26 1540) BP: 150/89 mmHg (09/26 1915) Pulse Rate: 63  (09/26 1915)  Labs:  Alvira Philips 04/20/12 1916 04/20/12 1618  HGB -- 14.1  HCT -- 43.5  PLT -- 187  APTT -- --  LABPROT -- 21.2*  INR -- 1.92*  HEPARINUNFRC -- --  CREATININE -- 0.72  CKTOTAL -- --  CKMB -- --  TROPONINI <0.30 <0.30    The CrCl is unknown because both a height and weight (above a minimum accepted value) are required for this calculation.   Medical History: Past Medical History  Diagnosis Date  . Esophageal stricture   . Gastritis   . GERD (gastroesophageal reflux disease)   . Osteoarthritis   . Glaucoma   . Rheumatoid arthritis   . Asthma   . Dysrhythmia     hx of irregular heart beat  . Shortness of breath   . Hypothyroidism   . Hypertension   . Pneumonia   . 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 Atrial fibrillation     Medications:  Warfarin 4mg  daily  Assessment: 29 yof with history of afib on chronic coumadin present to mced with chest pressure and shortness of breath. Her INR is just below range at 1.9. No overt bleeding is noted, last dose was taken last night. Will give slightly higher dose tonight then likely be able to return to home dose of 4mg  daily on 04/21/12.  Goal of Therapy:  INR 2-3 Monitor platelets by anticoagulation protocol: Yes   Plan:  Warfarin 5mg  tonight INR in am  Severiano Gilbert 04/20/2012,8:51 PM

## 2012-04-20 NOTE — ED Provider Notes (Signed)
History     CSN: [id number]  Arrival date & time 04/20/12  1534   First MD Initiated Contact with Patient 04/20/12 1537      Chief Complaint  Patient presents with  . Chest Pain  . Shortness of Breath    (Consider location/radiation/quality/duration/timing/severity/associated sxs/prior treatment) The history is provided by the patient, medical records and the EMS personnel.    Stacey Davenport is a 71 y.o. female presents to the emergency department complaining of chest pressure, shortness of breath, nausea and fatigue.  Patient states increasing dyspnea on exertion for the last couple of weeks. She endorses orthopnea, PND, increasing fatigue and shortness of breath at rest for the last day or so.  Patient states chest pressure and nausea began this morning she became concerned. She went to see her primary care physician who gave her one 81 mg aspirin and one nitroglycerin. Patient states the nitroglycerin decreased the pressure in her chest. Patient has a history of irregular heart rate and A. fib for which she takes Coumadin.  Patient states her Coumadin levels were within normal limits last week.  Patient also states she has a known UTI for which she filled her antibiotic this morning. She has not yet had her first dose.  Patient states the chest pain was gradual onset, described as a pressure, rated at a 5/10, nonradiating.  Patient denies fever, chills, headache, syncope, near syncope, weakness, dizziness.    Past Medical History  Diagnosis Date  . Esophageal stricture   . Gastritis   . GERD (gastroesophageal reflux disease)   . Osteoarthritis   . Glaucoma   . Rheumatoid arthritis   . Asthma   . Dysrhythmia     hx of irregular heart beat  . Shortness of breath   . Hypothyroidism   . Hypertension   . Pneumonia   . 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 Atrial fibrillation     Past Surgical History    Procedure Date  . Tubal ligation   . Appendectomy   . Right eye lid surgery   . Bilateral cataract extraction   . Cholecystectomy     Family History  Problem Relation Age of Onset  . Colon cancer Neg Hx   . Diabetes Mother   . Diabetes Sister     History  Substance Use Topics  . Smoking status: Never Smoker   . Smokeless tobacco: Never Used  . Alcohol Use: No    OB History    Grav Para Term Preterm Abortions TAB SAB Ect Mult Living                  Review of Systems  Constitutional: Positive for activity change and fatigue. Negative for fever, diaphoresis, appetite change and unexpected weight change.  HENT: Negative for congestion, mouth sores, neck pain, neck stiffness and sinus pressure.   Eyes: Negative for visual disturbance.  Respiratory: Positive for shortness of breath. Negative for cough, chest tightness and wheezing.   Cardiovascular: Positive for chest pain. Negative for palpitations and leg swelling.  Gastrointestinal: Positive for nausea. Negative for vomiting, abdominal pain, diarrhea and constipation.  Genitourinary: Positive for dysuria, urgency and frequency. Negative for hematuria.  Musculoskeletal: Negative for back pain.  Skin: Negative for rash.  Neurological: Negative for syncope, light-headedness and headaches.  Psychiatric/Behavioral: Negative for disturbed wake/sleep cycle. The patient is not nervous/anxious.   All other systems reviewed and are  negative.    Allergies  Cephalexin; Clarithromycin; Esomeprazole magnesium; Hydrocodone; Hydrocodone-acetaminophen; Ketek; Latex; Levaquin; Lipitor; Moxifloxacin; Rabeprazole sodium; and Sulfonamide derivatives  Home Medications   Current Outpatient Rx  Name Route Sig Dispense Refill  . AMIODARONE HCL 200 MG PO TABS Oral Take 200 mg by mouth daily.    Marland Kitchen BRIMONIDINE TARTRATE 0.2 % OP SOLN Both Eyes Place 1 drop into both eyes 3 (three) times daily.     Marland Kitchen CALCIUM-VITAMIN D 250-125 MG-UNIT PO TABS  Oral Take 1 tablet by mouth daily.      Marland Kitchen VITAMIN D 1000 UNITS PO TABS Oral Take 2,000 Units by mouth daily.    Marland Kitchen CICLESONIDE 160 MCG/ACT IN AERS Inhalation Inhale 1 puff into the lungs 2 (two) times daily.      . DEXLANSOPRAZOLE 60 MG PO CPDR Oral Take 60 mg by mouth daily.      Marland Kitchen DILTIAZEM HCL ER COATED BEADS 180 MG PO CP24 Oral Take 180 mg by mouth 2 (two) times daily.      Marland Kitchen LATANOPROST 0.005 % OP SOLN Both Eyes Place 1 drop into both eyes at bedtime.      Marland Kitchen LEVALBUTEROL TARTRATE 45 MCG/ACT IN AERO Inhalation Inhale 2 puffs into the lungs 2 (two) times daily.     Marland Kitchen LEVOTHYROXINE SODIUM 50 MCG PO TABS Oral Take 50 mcg by mouth daily.      Marland Kitchen LORATADINE 10 MG PO TABS Oral Take 10 mg by mouth daily.      Marland Kitchen LORAZEPAM 0.5 MG PO TABS Oral Take 0.5-1 mg by mouth daily as needed. For anxiety    . MONTELUKAST SODIUM 10 MG PO TABS Oral Take 10 mg by mouth daily.      Marygrace Drought WEIGHT SMART ADVANCE PO TABS Oral Take 1 tablet by mouth daily.      Marland Kitchen POTASSIUM CHLORIDE CRYS ER 10 MEQ PO TBCR Oral Take 10 mEq by mouth daily.      . WARFARIN SODIUM 4 MG PO TABS Oral Take 4 mg by mouth daily.      BP 134/61  Pulse 58  Temp 97.8 F (36.6 C) (Oral)  Resp 16  SpO2 98%  Physical Exam  Nursing note and vitals reviewed. Constitutional: She appears well-developed and well-nourished. No distress.  HENT:  Head: Normocephalic and atraumatic.  Mouth/Throat: Oropharynx is clear and moist. No oropharyngeal exudate.  Eyes: Conjunctivae normal are normal. Pupils are equal, round, and reactive to light. No scleral icterus.  Neck: Normal range of motion. Neck supple.  Cardiovascular: Normal rate, S1 normal, S2 normal, normal heart sounds and intact distal pulses.  An irregular rhythm present.  Pulses:      Carotid pulses are 2+ on the right side, and 2+ on the left side.      Radial pulses are 2+ on the right side, and 2+ on the left side.       Dorsalis pedis pulses are 2+ on the right side, and 2+ on the  left side.       Posterior tibial pulses are 2+ on the right side, and 2+ on the left side.  Pulmonary/Chest: Effort normal and breath sounds normal. No respiratory distress. She has no wheezes. She exhibits no tenderness.  Abdominal: Soft. Bowel sounds are normal. She exhibits no mass. There is no tenderness. There is no rebound and no guarding.  Musculoskeletal: Normal range of motion. She exhibits no edema.  Lymphadenopathy:    She has no cervical adenopathy.  Neurological:  She is alert. She exhibits normal muscle tone. Coordination normal.       Speech is clear and goal oriented Moves extremities without ataxia  Skin: Skin is warm and dry. No rash noted. She is not diaphoretic.  Psychiatric: She has a normal mood and affect.    ED Course  Procedures (including critical care time)  Labs Reviewed  URINALYSIS, ROUTINE W REFLEX MICROSCOPIC - Abnormal; Notable for the following:    Hgb urine dipstick SMALL (*)     All other components within normal limits  CBC WITH DIFFERENTIAL - Abnormal; Notable for the following:    RBC 5.19 (*)     RDW 18.3 (*)     All other components within normal limits  COMPREHENSIVE METABOLIC PANEL - Abnormal; Notable for the following:    GFR calc non Af Amer 84 (*)     All other components within normal limits  PROTIME-INR - Abnormal; Notable for the following:    Prothrombin Time 21.2 (*)     INR 1.92 (*)     All other components within normal limits  TROPONIN I  URINE MICROSCOPIC-ADD ON  TROPONIN I   Dg Chest 2 View  04/20/2012  *RADIOLOGY REPORT*  Clinical Data: Chest pain, shortness of breath.  CHEST - 2 VIEW  Comparison: 09/30/2011  Findings: There is hyperinflation of the lungs compatible with COPD.  Mild cardiomegaly.  No confluent airspace opacities or effusions.  No acute bony abnormality.  IMPRESSION: COPD.  Mild cardiomegaly.  No acute findings.   Original Report Authenticated By: Cyndie Chime, M.D.    Results for orders placed during the  hospital encounter of 04/20/12  URINALYSIS, ROUTINE W REFLEX MICROSCOPIC      Component Value Range   Color, Urine YELLOW  YELLOW   APPearance CLEAR  CLEAR   Specific Gravity, Urine 1.008  1.005 - 1.030   pH 7.0  5.0 - 8.0   Glucose, UA NEGATIVE  NEGATIVE mg/dL   Hgb urine dipstick SMALL (*) NEGATIVE   Bilirubin Urine NEGATIVE  NEGATIVE   Ketones, ur NEGATIVE  NEGATIVE mg/dL   Protein, ur NEGATIVE  NEGATIVE mg/dL   Urobilinogen, UA 0.2  0.0 - 1.0 mg/dL   Nitrite NEGATIVE  NEGATIVE   Leukocytes, UA NEGATIVE  NEGATIVE  TROPONIN I      Component Value Range   Troponin I <0.30  <0.30 ng/mL  CBC WITH DIFFERENTIAL      Component Value Range   WBC 6.9  4.0 - 10.5 K/uL   RBC 5.19 (*) 3.87 - 5.11 MIL/uL   Hemoglobin 14.1  12.0 - 15.0 g/dL   HCT 16.1  09.6 - 04.5 %   MCV 83.8  78.0 - 100.0 fL   MCH 27.2  26.0 - 34.0 pg   MCHC 32.4  30.0 - 36.0 g/dL   RDW 40.9 (*) 81.1 - 91.4 %   Platelets 187  150 - 400 K/uL   Neutrophils Relative 74  43 - 77 %   Neutro Abs 5.1  1.7 - 7.7 K/uL   Lymphocytes Relative 20  12 - 46 %   Lymphs Abs 1.3  0.7 - 4.0 K/uL   Monocytes Relative 5  3 - 12 %   Monocytes Absolute 0.3  0.1 - 1.0 K/uL   Eosinophils Relative 1  0 - 5 %   Eosinophils Absolute 0.1  0.0 - 0.7 K/uL   Basophils Relative 1  0 - 1 %   Basophils Absolute 0.0  0.0 - 0.1 K/uL  COMPREHENSIVE METABOLIC PANEL      Component Value Range   Sodium 141  135 - 145 mEq/L   Potassium 3.9  3.5 - 5.1 mEq/L   Chloride 106  96 - 112 mEq/L   CO2 20  19 - 32 mEq/L   Glucose, Bld 97  70 - 99 mg/dL   BUN 10  6 - 23 mg/dL   Creatinine, Ser 2.13  0.50 - 1.10 mg/dL   Calcium 9.4  8.4 - 08.6 mg/dL   Total Protein 7.1  6.0 - 8.3 g/dL   Albumin 4.0  3.5 - 5.2 g/dL   AST 22  0 - 37 U/L   ALT 14  0 - 35 U/L   Alkaline Phosphatase 64  39 - 117 U/L   Total Bilirubin 0.3  0.3 - 1.2 mg/dL   GFR calc non Af Amer 84 (*) >90 mL/min   GFR calc Af Amer >90  >90 mL/min  PROTIME-INR      Component Value Range    Prothrombin Time 21.2 (*) 11.6 - 15.2 seconds   INR 1.92 (*) 0.00 - 1.49  URINE MICROSCOPIC-ADD ON      Component Value Range   Squamous Epithelial / LPF RARE  RARE   RBC / HPF 3-6  <3 RBC/hpf   Dg Chest 2 View  04/20/2012  *RADIOLOGY REPORT*  Clinical Data: Chest pain, shortness of breath.  CHEST - 2 VIEW  Comparison: 09/30/2011  Findings: There is hyperinflation of the lungs compatible with COPD.  Mild cardiomegaly.  No confluent airspace opacities or effusions.  No acute bony abnormality.  IMPRESSION: COPD.  Mild cardiomegaly.  No acute findings.   Original Report Authenticated By: Cyndie Chime, M.D.    ECG:  Date: 04/20/2012  Rate: 67  Rhythm: normal sinus rhythm  QRS Axis: left  Intervals: normal  ST/T Wave abnormalities: normal  Conduction Disutrbances:left anterior fascicular block  Narrative Interpretation: sinus rhythm, nonischemic ECG  Old EKG Reviewed: changes noted   1. Chest pressure   2. Nausea   3. Shortness of breath   4. Dyspnea on exertion       MDM  Stacey Davenport presents with increasing shortness of breath, fatigue and chest pressure. Concern for heart failure versus MI.  Heart Score 5 (without troponin result).  Chest x-ray with evidence of COPD and mild cardiomegaly; no evidence of pneumonia or pulmonary edema. Urinalysis without evidence of UTI, CBC CMP unremarkable, PT/INR: 21.2 and 1.92 respectively.  Repeat troponin pending. ECG without evidence of ischemia.  No evidence of ACS at this time. Patient remains uncomfortable with persistent chest pressure. We'll admit for further cardiac workup.   Dr. Ranae Palms was consulted, evaluated this patient with me and agrees with the plan.           Dahlia Client Zahir Eisenhour, PA-C 04/21/12 [phone]

## 2012-04-21 DIAGNOSIS — M940 Chondrocostal junction syndrome [Tietze]: Secondary | ICD-10-CM

## 2012-04-21 DIAGNOSIS — E039 Hypothyroidism, unspecified: Secondary | ICD-10-CM

## 2012-04-21 DIAGNOSIS — K219 Gastro-esophageal reflux disease without esophagitis: Secondary | ICD-10-CM

## 2012-04-21 DIAGNOSIS — R0609 Other forms of dyspnea: Secondary | ICD-10-CM

## 2012-04-21 LAB — PROTIME-INR
INR: 2.06 — ABNORMAL HIGH (ref 0.00–1.49)
Prothrombin Time: 22.4 seconds — ABNORMAL HIGH (ref 11.6–15.2)

## 2012-04-21 LAB — TROPONIN I: Troponin I: <0.3 ng/mL (ref <0.30)

## 2012-04-21 MED ORDER — WARFARIN SODIUM 4 MG PO TABS
4.0000 mg | ORAL_TABLET | Freq: Every day | ORAL | Status: DC
  Filled 2012-04-21: qty 1

## 2012-04-21 NOTE — H&P (Signed)
Internal Medicine Teaching Service Attending Note Date: 04/21/2012  Patient name: Stacey Davenport  Medical record number: [id number]  Date of birth: 03/19/1941   I have seen and evaluated Stacey Davenport and discussed their care with the Residency Team.    Stacey Davenport is a 71yo W woman who presented to the ED complaining of chest pressure with associated SOB, nausea and fatigue.  She notes that she has had fatigue since being discharged from rehab in March of this year.  Her PCP diagnosed her with Fe deficiency anemia, for which she is being treated.  She has also had a lot of social stressors, which have made her anxious and more tired.  She has now had dyspnea doing normal activities and chores at home.  On the day prior to admission, she had awoken from sleep due to acute sob and had to sit up to catch her breath before going back to sleep.  Around 11am on the day of admission, she noted the onset of chest pressure, in the middle of her chest which was associated with nausea.  This pain was 8/10 at its worst.  She took her BP during that time and noted her systolic pressure was high, in the 180s-190s.  She called her PCP and went in to be seen. Her Doctor advised ER assessment.  Further symptoms include diarrhea for the past 2 days, abdominal pain, GERD.    She has complete resolution of her pain now, but she is not sure if NTG helped, or if just time helped.   Further PMH/PFSH/All/Meds/ROS per resident note.   Physical Exam: Blood pressure 148/78, pulse 52, temperature 97.7 F (36.5 C), temperature source Oral, resp. rate 18, height 5\' 4"  (1.626 m), weight 189 lb 12.8 oz (86.093 kg), SpO2 97.00%. General appearance: alert, cooperative, appears stated age and no distress Head: Normocephalic, without obvious abnormality, atraumatic Eyes: EOMI, PERRL, glasses in place Lungs: clear to auscultation bilaterally and no wheezes Heart: regular rate and rhythm Abdomen: soft, non-tender; bowel sounds  normal; no masses,  no organomegaly Extremities: extremities normal, atraumatic, no cyanosis or edema Pulses: 2+ and symmetric Skin:  warm/dry  Lab results: Results for orders placed during the hospital encounter of 04/20/12 (from the past 24 hour(s))  URINALYSIS, ROUTINE W REFLEX MICROSCOPIC     Status: Abnormal   Collection Time   04/20/12  4:05 PM      Component Value Range   Color, Urine YELLOW  YELLOW   APPearance CLEAR  CLEAR   Specific Gravity, Urine 1.008  1.005 - 1.030   pH 7.0  5.0 - 8.0   Glucose, UA NEGATIVE  NEGATIVE mg/dL   Hgb urine dipstick SMALL (*) NEGATIVE   Bilirubin Urine NEGATIVE  NEGATIVE   Ketones, ur NEGATIVE  NEGATIVE mg/dL   Protein, ur NEGATIVE  NEGATIVE mg/dL   Urobilinogen, UA 0.2  0.0 - 1.0 mg/dL   Nitrite NEGATIVE  NEGATIVE   Leukocytes, UA NEGATIVE  NEGATIVE  URINE MICROSCOPIC-ADD ON     Status: Normal   Collection Time   04/20/12  4:05 PM      Component Value Range   Squamous Epithelial / LPF RARE  RARE   RBC / HPF 3-6  <3 RBC/hpf  TROPONIN I     Status: Normal   Collection Time   04/20/12  4:18 PM      Component Value Range   Troponin I <0.30  <0.30 ng/mL  CBC WITH DIFFERENTIAL     Status:  Abnormal   Collection Time   04/20/12  4:18 PM      Component Value Range   WBC 6.9  4.0 - 10.5 K/uL   RBC 5.19 (*) 3.87 - 5.11 MIL/uL   Hemoglobin 14.1  12.0 - 15.0 g/dL   HCT 91.4  78.2 - 95.6 %   MCV 83.8  78.0 - 100.0 fL   MCH 27.2  26.0 - 34.0 pg   MCHC 32.4  30.0 - 36.0 g/dL   RDW 21.3 (*) 08.6 - 57.8 %   Platelets 187  150 - 400 K/uL   Neutrophils Relative 74  43 - 77 %   Neutro Abs 5.1  1.7 - 7.7 K/uL   Lymphocytes Relative 20  12 - 46 %   Lymphs Abs 1.3  0.7 - 4.0 K/uL   Monocytes Relative 5  3 - 12 %   Monocytes Absolute 0.3  0.1 - 1.0 K/uL   Eosinophils Relative 1  0 - 5 %   Eosinophils Absolute 0.1  0.0 - 0.7 K/uL   Basophils Relative 1  0 - 1 %   Basophils Absolute 0.0  0.0 - 0.1 K/uL  COMPREHENSIVE METABOLIC PANEL     Status:  Abnormal   Collection Time   04/20/12  4:18 PM      Component Value Range   Sodium 141  135 - 145 mEq/L   Potassium 3.9  3.5 - 5.1 mEq/L   Chloride 106  96 - 112 mEq/L   CO2 20  19 - 32 mEq/L   Glucose, Bld 97  70 - 99 mg/dL   BUN 10  6 - 23 mg/dL   Creatinine, Ser 4.69  0.50 - 1.10 mg/dL   Calcium 9.4  8.4 - 62.9 mg/dL   Total Protein 7.1  6.0 - 8.3 g/dL   Albumin 4.0  3.5 - 5.2 g/dL   AST 22  0 - 37 U/L   ALT 14  0 - 35 U/L   Alkaline Phosphatase 64  39 - 117 U/L   Total Bilirubin 0.3  0.3 - 1.2 mg/dL   GFR calc non Af Amer 84 (*) >90 mL/min   GFR calc Af Amer >90  >90 mL/min  PROTIME-INR     Status: Abnormal   Collection Time   04/20/12  4:18 PM      Component Value Range   Prothrombin Time 21.2 (*) 11.6 - 15.2 seconds   INR 1.92 (*) 0.00 - 1.49  TROPONIN I     Status: Normal   Collection Time   04/20/12  7:16 PM      Component Value Range   Troponin I <0.30  <0.30 ng/mL  PROTIME-INR     Status: Abnormal   Collection Time   04/21/12  6:00 AM      Component Value Range   Prothrombin Time 22.4 (*) 11.6 - 15.2 seconds   INR 2.06 (*) 0.00 - 1.49  TROPONIN I     Status: Normal   Collection Time   04/21/12  6:00 AM      Component Value Range   Troponin I <0.30  <0.30 ng/mL    Imaging results:  Dg Chest 2 View  04/20/2012  *RADIOLOGY REPORT*  Clinical Data: Chest pain, shortness of breath.  CHEST - 2 VIEW  Comparison: 09/30/2011  Findings: There is hyperinflation of the lungs compatible with COPD.  Mild cardiomegaly.  No confluent airspace opacities or effusions.  No acute bony abnormality.  IMPRESSION: COPD.  Mild cardiomegaly.  No acute findings.   Original Report Authenticated By: Cyndie Chime, M.D.     Assessment and Plan: I agree with the formulated Assessment and Plan with the following changes:   1. Chest Pressure/SOB - Patient with h/o Afib and CVA and HTN; DDx includes ACS, heart failure, anxiety, costochondritis, GERD - Admit to telemetry, evaluate for  worsening Afib - CE X 3 - CXR as above, no acute findings - Nitro and Toradol for pain - Zofran for nausea if necessary - EKG performed without new changes - Patient would benefit from GI follow up as an outpatient, to see if this could be concerning for another stricture (hisotry of strictures and dilations in the past)  2. SOB for last couple weeks - Outpatient PFTs  If CE are negative, Ms. Vogl could likely go home with outpatient workup for pulmonary and GI causes of chest pain  Inez Catalina, MD 9/27/201312:56 PM

## 2012-04-21 NOTE — Progress Notes (Signed)
Medical Student Daily Progress Note   Subjective:    Interval Events:  No acute overnight events. Feeling better overall. Patient currently denies chest pressure, chest pain, palpitations, or dyspnea at rest. Was able to ambulate. Appropriate appetite, although endorses slight nausea this morning. No bowel movement. Denies dizziness/vertigo, abdominal pain, or dysuria. She denies any recent heavy lifting.    Objective:    Vital Signs:   Temp:  [97.7 F (36.5 C)-98 F (36.7 C)] 97.7 F (36.5 C) (09/27 0500) Pulse Rate:  [52-69] 52  (09/27 0500) Resp:  [14-20] 18  (09/27 0500) BP: (122-169)/(57-89) 148/78 mmHg (09/27 0947) SpO2:  [97 %-99 %] 97 % (09/27 0500) Weight:  [86.093 kg (189 lb 12.8 oz)] 86.093 kg (189 lb 12.8 oz) 04-23-2023 2254) Last BM Date: 04/21/12   Weights: 24-hour Weight change:   Filed Weights   04/22/2012 2254  Weight: 86.093 kg (189 lb 12.8 oz)     Intake/Output:   Intake/Output Summary (Last 24 hours) at 04/21/12 1025 Last data filed at 04/21/12 0946  Gross per 24 hour  Intake    363 ml  Output      0 ml  Net    363 ml       Physical Exam: General appearance: alert, cooperative and no distress Resp: clear to auscultation bilaterally Chest wall: no tenderness Cardio: regular rate and rhythm, S1, S2 normal, no S3 or S4, no click, no rub and no appreciable murmur, no JVD GI: soft, normal bowel sounds, slight tenderness to deep palpation Extremities: no edema, redness or tenderness in the calves or thighs and  Skin: Skin color, texture, turgor normal. No rashes or lesions    Labs: Basic Metabolic Panel:  Lab 04-22-12 1610  Stacey Davenport 141  K 3.9  CL 106  CO2 20  GLUCOSE 97  BUN 10  CREATININE 0.72  CALCIUM 9.4  MG --  PHOS --    Liver Function Tests:  Lab 22-Apr-2012 1618  AST 22  ALT 14  ALKPHOS 64  BILITOT 0.3  PROT 7.1  ALBUMIN 4.0   No results found for this basename: LIPASE:5,AMYLASE:5 in the last 168 hours No results found for this  basename: AMMONIA:3 in the last 168 hours  CBC:  Lab 22-Apr-2012 1618  WBC 6.9  NEUTROABS 5.1  HGB 14.1  HCT 43.5  MCV 83.8  PLT 187    Cardiac Enzymes:  Lab 04/21/12 0600 04/22/2012 1916 04-22-2012 1618  CKTOTAL -- -- --  CKMB -- -- --  CKMBINDEX -- -- --  TROPONINI <0.30 <0.30 <0.30    BNP (last 3 results):  Basename 09/30/11 1609  PROBNP 1510.0*    CBG: No results found for this basename: GLUCAP:5 in the last 168 hours  Coagulation Studies:  Basename 04/21/12 0600 April 22, 2012 1618  LABPROT 22.4* 21.2*  INR 2.06* 1.92*    Microbiology: Results for orders placed during the hospital encounter of 09/30/11  MRSA PCR SCREENING     Status: Normal   Collection Time   09/30/11  6:50 PM      Component Value Range Status Comment   MRSA by PCR NEGATIVE  NEGATIVE Final   CULTURE, BLOOD (ROUTINE X 2)     Status: Normal   Collection Time   09/30/11  9:25 PM      Component Value Range Status Comment   Specimen Description BLOOD RIGHT HAND   Final    Special Requests BOTTLES DRAWN AEROBIC ONLY 6CC   Final    Culture  Setup Time [id number]   Final    Culture NO GROWTH 5 DAYS   Final    Report Status 10/07/2011 FINAL   Final   CULTURE, BLOOD (ROUTINE X 2)     Status: Normal   Collection Time   09/30/11  9:40 PM      Component Value Range Status Comment   Specimen Description BLOOD LEFT ARM   Final    Special Requests BOTTLES DRAWN AEROBIC ONLY [facility]   Final    Culture  Setup Time [id number]   Final    Culture NO GROWTH 5 DAYS   Final    Report Status 10/07/2011 FINAL   Final   URINE CULTURE     Status: Normal   Collection Time   09/30/11 10:40 PM      Component Value Range Status Comment   Specimen Description URINE, CLEAN CATCH   Final    Special Requests NONE   Final    Culture  Setup Time [id number]   Final    Colony Count NO GROWTH   Final    Culture NO GROWTH   Final    Report Status 10/02/2011 FINAL   Final   URINE CULTURE     Status: Normal   Collection Time    10/02/11  2:38 PM      Component Value Range Status Comment   Specimen Description URINE, CLEAN CATCH   Final    Special Requests NONE   Final    Culture  Setup Time [id number]   Final    Colony Count NO GROWTH   Final    Culture NO GROWTH   Final    Report Status 10/03/2011 FINAL   Final     Other results: EKG Results: Rate:  67 PR:  196 QRS:  420 QTc:  443 EKG: probable left atrial abnormality, normal sinus rhythm with no new ST or T wave abnormalities, ekg unchanged from previous findings.  Imaging: Dg Chest 2 View  04/20/2012  *RADIOLOGY REPORT*  Clinical Data: Chest pain, shortness of breath.  CHEST - 2 VIEW  Comparison: 09/30/2011  Findings: There is hyperinflation of the lungs compatible with COPD.  Mild cardiomegaly.  No confluent airspace opacities or effusions.  No acute bony abnormality.  IMPRESSION: COPD.  Mild cardiomegaly.  No acute findings.   Original Report Authenticated By: Cyndie Chime, M.D.       Medications:    Infusions:     Scheduled Medications:    . amiodarone  200 mg Oral Daily  . aspirin  243 mg Oral Once  . brimonidine  1 drop Both Eyes TID  . calcium-vitamin D  1 tablet Oral Daily  . cholecalciferol  2,000 Units Oral Daily  . diltiazem  180 mg Oral BID  . fluticasone  1 puff Inhalation BID  . latanoprost  1 drop Both Eyes QHS  . levalbuterol  2 puff Inhalation BID  . levothyroxine  50 mcg Oral Q0600  . loratadine  10 mg Oral Daily  . montelukast  10 mg Oral Daily  . multivitamin with minerals  1 tablet Oral Daily  . ondansetron (ZOFRAN) IV  4 mg Intravenous Once  . pantoprazole  80 mg Oral Q1200  . potassium chloride  10 mEq Oral Daily  . sodium chloride  3 mL Intravenous Q12H  . sodium chloride  3 mL Intravenous Q12H  . warfarin  4 mg Oral q1800  . warfarin  5 mg Oral Once  . Warfarin -  Pharmacist Dosing Inpatient   Does not apply q1800  . DISCONTD: ciclesonide  1 puff Inhalation BID     PRN Medications: sodium chloride,  acetaminophen, ketorolac, LORazepam, nitroGLYCERIN, ondansetron (ZOFRAN) IV, ondansetron, sodium chloride   Assessment/ Plan by problem:    Stacey Davenport is a 71 year old with a history of paroxysmal atrial fibrillation currently rate and rhythm controlled with anticoagulation, GERD, HTN, asthma and hypothyroidism who was admitted for home systolic pressure of 182 and acute chest pressure and dyspnea at rest.  1) Chest pressure and dyspnea: The patient presented with acute chest pressure of 1 day and shortness of breath of 2 days at rest. Her pressure was partially relieved by nitroglycerin and reproducible by chest palpation. She has a history of atrial fibrillation and hypertension. Today she denies chest pressure. Negative cardiac enzymes x3, s/s, and EKG findings make ACS less likely. Possible etiologies given patient history and s/s include GERD vs. asthma exacerbation vs. MSK cause. Continue to monitor for signs of symptomatic improvement.  2) GERD: Patient has a history of esophageal stricture with recent worsening of GERD symptoms. --Continue home dose of delansoprazole 60mg  BID.  3) Atrial fibrillation: currently rhythm controlled with amiodarone and anti-coagulated with coumadin. --Continue home dose of amiodarone 200mg  qDay --Patient INR was 1.92 on admission. Coumadin was dosed at 5mg  overnight. This AM, INR is 2.06. We will continue home dose of 4mg .  4) Hypertension: continue home dose of diltiazem 180mg  BID.  5) Asthma: Controlled with multiple medications and inhalers. --Continue home regimen of fluticasone, levalbuterol, claritin, and singulair.  6) Hypothyroidism: Patient is currently asymptomatic. Last TSH was 1.945 in March. Continue Synthroid 50mg  qDay.  7) PPx: DVT - coumadin, encourage ambulation.  8) Dispo: discharge to home with follow up in 5 days with PCP.   Wynelle Link, MS4 This is a Psychologist, occupational Note.  The care of the patient was discussed with Dr. Dede Query and  the assessment and plan formulated with their assistance.  Please see their attached note or addendum for official documentation of the daily encounter.  Resident Co-sign Daily Note: I have seen the patient and reviewed the daily progress note by MA 4 Wynelle Link and discussed the care of the patient with them.  See below for documentation of my findings, assessment, and plans.  Subjective: See above note Objective: Vital signs in last 24 hours: Filed Vitals:   04/20/12 2254 04/21/12 0500 04/21/12 0947 04/21/12 1330  BP: 169/88 139/80 148/78 131/76  Pulse: 64 52  62  Temp: 98 F (36.7 C) 97.7 F (36.5 C)  98 F (36.7 C)  TempSrc:      Resp: 18 18  18   Height: 5\' 4"  (1.626 m)     Weight: 189 lb 12.8 oz (86.093 kg)     SpO2: 97% 97%  97%   Physical Exam: General: NAD Neck: unable to assess JVD due to bosy habitus Lungs: CTA B/L Heart: RRR, No M/G/R Abd: Soft, BS x 4 Ext: no edema  Lab Results: Reviewed and documented in Electronic Record Micro Results: Reviewed and documented in Electronic Record Studies/Results: Reviewed and documented in Electronic Record Medications: I have reviewed the patient's current medications. Scheduled Meds:   . amiodarone  200 mg Oral Daily  . aspirin  243 mg Oral Once  . brimonidine  1 drop Both Eyes TID  . calcium-vitamin D  1 tablet Oral Daily  . cholecalciferol  2,000 Units Oral Daily  . diltiazem  180 mg  Oral BID  . fluticasone  1 puff Inhalation BID  . latanoprost  1 drop Both Eyes QHS  . levalbuterol  2 puff Inhalation BID  . levothyroxine  50 mcg Oral Q0600  . loratadine  10 mg Oral Daily  . montelukast  10 mg Oral Daily  . multivitamin with minerals  1 tablet Oral Daily  . ondansetron (ZOFRAN) IV  4 mg Intravenous Once  . pantoprazole  80 mg Oral Q1200  . potassium chloride  10 mEq Oral Daily  . sodium chloride  3 mL Intravenous Q12H  . sodium chloride  3 mL Intravenous Q12H  . warfarin  4 mg Oral q1800  . warfarin  5 mg  Oral Once  . Warfarin - Pharmacist Dosing Inpatient   Does not apply q1800  . DISCONTD: ciclesonide  1 puff Inhalation BID   Continuous Infusions:  PRN Meds:.sodium chloride, acetaminophen, ketorolac, LORazepam, nitroGLYCERIN, ondansetron (ZOFRAN) IV, ondansetron, sodium chloride Assessment/Plan:  Agree with above note, patient has had negative cardiac workup on admission. Her chest pain is reproducible by palpation. She also c/o worsening GERD symptoms recently. Thus, her chest pain could be related to Costochondritis vs GERD. Patient is stable w/o chest discomfort. She is ready to go home  With regards to her chronic intermittent SOB. CHF unlikely given no fluid overload on exam and Good EF of 65-70% with grade 1 diastolic dysfunction on her Echo in March 2013. Patient has had history of Asthma and her admission CXR showed hyperinflation, which could indicate that she has COPD. Patient is unsure whether she has had PFT. I think that a PFT is warranted if she has never had one. Additionally, she states that she snores at nighttime and often feels tired and sleepy during the day, with which a sleep study should be considered as an outpatient. Will defer PFT and sleep study to her Pulmonologist.   She does not have SOB or chest discomfort. She is ready to be discharged today.     LOS: 1 day   Stacey Davenport 04/21/2012, 1:45 PM

## 2012-04-21 NOTE — ED Provider Notes (Signed)
Medical screening examination/treatment/procedure(s) were conducted as a shared visit with non-physician practitioner(s) and myself.  I personally evaluated the patient during the encounter   Donald Memoli, MD 04/21/12 2306 

## 2012-04-21 NOTE — Discharge Summary (Signed)
Internal Medicine Teaching [facility] Discharge Note  Name: Stacey Davenport MRN: [id number] DOB: [birth year] 71 y.o.  Date of Admission: 04/20/2012  3:34 PM Date of Discharge: 04/21/2012 Attending Physician: Inez Catalina, MD  Discharge Diagnosis: 1. Chest pain, multifactorial 2. Costochondritis   3. Chronic intermittent dyspnea, likely due to chronic lung disease 4. GERD 5. Hypertension 6. Hypothyroidism 7. History of Asthma 8. History of atrial fibrillation   Discharge Medications:   Medication List     As of 04/21/2012  2:15 PM    TAKE these medications         amiodarone 200 MG tablet   Commonly known as: PACERONE   Take 200 mg by mouth daily.      brimonidine 0.2 % ophthalmic solution   Commonly known as: ALPHAGAN   Place 1 drop into both eyes 3 (three) times daily.      calcium-vitamin D 250-125 MG-UNIT per tablet   Commonly known as: OSCAL   Take 1 tablet by mouth daily.      cholecalciferol 1000 UNITS tablet   Commonly known as: VITAMIN D   Take 2,000 Units by mouth daily.      ciclesonide 160 MCG/ACT inhaler   Commonly known as: ALVESCO   Inhale 1 puff into the lungs 2 (two) times daily.      DEXILANT 60 MG capsule   Generic drug: dexlansoprazole   Take 60 mg by mouth daily.      diltiazem 180 MG 24 hr capsule   Commonly known as: CARDIZEM CD   Take 180 mg by mouth 2 (two) times daily.      latanoprost 0.005 % ophthalmic solution   Commonly known as: XALATAN   Place 1 drop into both eyes at bedtime.      levothyroxine 50 MCG tablet   Commonly known as: SYNTHROID, LEVOTHROID   Take 50 mcg by mouth daily.      loratadine 10 MG tablet   Commonly known as: CLARITIN   Take 10 mg by mouth daily.      LORazepam 0.5 MG tablet   Commonly known as: ATIVAN   Take 0.5-1 mg by mouth daily as needed. For anxiety      montelukast 10 MG tablet   Commonly known as: SINGULAIR   Take 10 mg by mouth daily.      One-A-Day Weight Smart Advance Tabs   Take 1 tablet by mouth daily.      potassium chloride 10 MEQ tablet   Commonly known as: K-DUR,KLOR-CON   Take 10 mEq by mouth daily.      warfarin 4 MG tablet   Commonly known as: COUMADIN   Take 4 mg by mouth daily.      XOPENEX HFA 45 MCG/ACT inhaler   Generic drug: levalbuterol   Inhale 2 puffs into the lungs 2 (two) times daily.        Disposition and follow-up:   Stacey Davenport was discharged from [facility] in stable condition.  At the hospital follow up visit please address   1. Chest pain 2/2 costochondritis and GERD. If costochondritis not resolved. May prescribe capsaicin cream. Avoid NSAIDs due to GERD.   2. Chronic intermittent dyspnea: will benefit from outpatient PFT and sleep study if never done. Defer the management to her Pulmonologist Dr. Stevphen Rochester.   3. GERD. Patient would benefit from GI follow up as an outpatient, to see if this could be concerning for another stricture (hisotry of  strictures and dilations in the past)   Follow-up Appointments:     Follow-up Information    Follow up with Rudi Heap, MD. Call in 1 week.   Contact information:   [address] [place] [address] [phone]       Follow up with Rollene Rotunda, MD. Call in 2 weeks.   Contact information:   [address] [place] [address] [phone]       Follow up with Kerry Kass, MD. Call in 1 week.   Contact information:   [address], [address] [place] [address] [phone]         Discharge Orders    Future Appointments: Provider: Department: Dept Phone: Center:   07/12/2012 1:00 PM Rollene Rotunda, MD [facility] [phone] [facility]      Consultations:  None  Procedures Performed:  Dg Chest 2 View  04/20/2012  *RADIOLOGY REPORT*  Clinical Data: Chest pain, shortness of breath.  CHEST - 2 VIEW  Comparison: 09/30/2011  Findings: There is hyperinflation of  the lungs compatible with COPD.  Mild cardiomegaly.  [place] confluent airspace opacities or effusions.  [place] acute bony abnormality.  IMPRESSION: COPD.  Mild cardiomegaly.  [place] acute findings.   Original Report Authenticated By: Cyndie Chime, M.D.     Admission HPI:  Stacey Davenport is a 71 yo F with a history of paroxysmal atrial fibrillation on coumadin, GERD, CVA 09/2011, hypothyroidism, HTN, who presents with several days of dyspnea, chest pressure, nausea, and fatigue. She states that over the past several days she has had shortness of breath doing normal activities and chores at home. She awoke from sleep last night with acute dyspnea and had to sit up and catch her breath before going back to sleep. She has had worsening excessive fatigue that is unusual for her. Around 11am on day of admission, she began to feel chest pressure, like someone sitting on her chest. Associated with nausea, Daven[place] vomiting. She states it is 8/10 at it's worst, 5/10 currently, constant, not episodic. Location is substernal, non radiating. [place] palpitations. Patient states that she began to feel anxious about her condition, checked her blood pressure and was concerned that it was 184 systolic and it is usually in the 130's. She made an appointment with her PCP where she was given nitroglycerin, ASA and got an EKG. NTG seemed to alleviate her pain partially.  ROS + for diarrhea the past 2 days, mild abdominal pain, chills, 2-3 pillow orthopnea (at baseline), worsening of GERD symptoms recently. She has also had urinary symptoms of increased frequency and irritation since Sunday. Was given a Rx for antibiotics but did not fill it until today. States that her "nerves" are acting up with concern for her health related to this SOB and chest pressure.  [place] recent travel, [place] fever, [place] cough.  [place] smoking history.   Hospital Course by problem list:  # Chest pain, multifactorial including Costochondritis and GERD:  The patient presented with  acute chest pain/pressure along with some GERD symptoms, which was partially relieved by nitroglycerin. She has had negative cardiac workup and her EKG is unremarkable for any ischemic changes. Reproducible chest tenderness to palpation on exam. Dav[place] acute abnormality on CXR. The etiology of her chest pain could be associated with costochondritis and/or GERD   She is stable upon discharge. [place] c/o chest pain/pressure. She is instructed to continue her PPI Dexilant and will follow up with her PCP  if her Costochondritis is not resolved with rest (avoid muscle strain) and warm/cold compresses. She may be able to use some capsicin cream if needed. Will defer it to her PCP.    # Chronic intermittent dyspnea, likely due to chronic lung disease    She reports chronic intermittent SOB. CHF unlikely given [place] fluid overload on exam and Good EF of 65-70% with grade 1 diastolic dysfunction on her Echo in March 2013. Patient has had history of Asthma and her admission CXR showed hyperinflation, which could indicate that she has COPD. Given her body habitus, she may have restrictive lung disease as well. Patient is unsure whether she has had PFT. I think that a PFT is warranted if she has never had one. Additionally, she states that she snores at nighttime and often feels tired and sleepy during the day, with which a sleep study should be considered as an outpatient. Will defer PFT and sleep study to her Pulmonologist.   # GERD: The patient has a history of esophageal stricture with recent worsening of GERD symptoms. During her hospital course she denied symptoms of heartburn we continued home dose of delansoprazole 60mg  BID. Patient would benefit from GI follow up as an outpatient, to see if this could be concerning for another stricture (hisotry of strictures and dilations in the past)   # history of Atrial fibrillation: Patient has a history of paroxysmal atrial fibrillation. She is currently rhythm controlled with  amiodarone and anti-coagulated with coumadin. Her INR was 2.06 upon discharge and we resumed her home dose of 4mg .   # Hypertension:  Stable on home dose of Cardizem 180 QD  # History of Asthma: stable.   we will continue her home regimen  # Hypothyroidism: stable. Continue home regimen.   Discharge Vitals:  BP 131/76  Pulse 62  Temp 98 F (36.7 C) (Oral)  Resp 18  Ht 5\' 4"  (1.626 m)  Wt 189 lb 12.8 oz (86.093 kg)  BMI 32.58 kg/m2  SpO2 97%  Discharge Labs:  Results for orders placed during the hospital encounter of 04/20/12 (from the past 24 hour(s))  URINALYSIS, ROUTINE W REFLEX MICROSCOPIC     Status: Abnormal   Collection Time   04/20/12  4:05 PM      Component Value Range   Color, Urine YELLOW  YELLOW   APPearance CLEAR  CLEAR   Specific Gravity, Urine 1.008  1.005 - 1.030   pH 7.0  5.0 - 8.0   Glucose, UA NEGATIVE  NEGATIVE mg/dL   Hgb urine dipstick SMALL (*) NEGATIVE   Bilirubin Urine NEGATIVE  NEGATIVE   Ketones, ur NEGATIVE  NEGATIVE mg/dL   Protein, ur NEGATIVE  NEGATIVE mg/dL   Urobilinogen, UA 0.2  0.0 - 1.0 mg/dL   Nitrite NEGATIVE  NEGATIVE   Leukocytes, UA NEGATIVE  NEGATIVE  URINE MICROSCOPIC-ADD ON     Status: Normal   Collection Time   04/20/12  4:05 PM      Component Value Range   Squamous Epithelial / LPF RARE  RARE   RBC / HPF 3-6  <3 RBC/hpf  TROPONIN I     Status: Normal   Collection Time   04/20/12  4:18 PM      Component Value Range   Troponin I <0.30  <0.30 ng/mL  CBC WITH DIFFERENTIAL     Status: Abnormal   Collection Time   04/20/12  4:18 PM      Component Value Range   WBC 6.9  4.0 - 10.5 K/uL   RBC 5.19 (*) 3.87 - 5.11 MIL/uL   Hemoglobin 14.1  12.0 - 15.0 g/dL   HCT 16.1  09.6 - 04.5 %   MCV 83.8  78.0 - 100.0 fL   MCH 27.2  26.0 - 34.0 pg   MCHC 32.4  30.0 - 36.0 g/dL   RDW 40.9 (*) 81.1 - 91.4 %   Platelets 187  150 - 400 K/uL   Neutrophils Relative 74  43 - 77 %   Neutro Abs 5.1  1.7 - 7.7 K/uL   Lymphocytes Relative 20   12 - 46 %   Lymphs Abs 1.3  0.7 - 4.0 K/uL   Monocytes Relative 5  3 - 12 %   Monocytes Absolute 0.3  0.1 - 1.0 K/uL   Eosinophils Relative 1  0 - 5 %   Eosinophils Absolute 0.1  0.0 - 0.7 K/uL   Basophils Relative 1  0 - 1 %   Basophils Absolute 0.0  0.0 - 0.1 K/uL  COMPREHENSIVE METABOLIC PANEL     Status: Abnormal   Collection Time   04/20/12  4:18 PM      Component Value Range   Sodium 141  135 - 145 mEq/L   Potassium 3.9  3.5 - 5.1 mEq/L   Chloride 106  96 - 112 mEq/L   CO2 20  19 - 32 mEq/L   Glucose, Bld 97  70 - 99 mg/dL   BUN 10  6 - 23 mg/dL   Creatinine, Ser 7.82  0.50 - 1.10 mg/dL   Calcium 9.4  8.4 - 95.6 mg/dL   Total Protein 7.1  6.0 - 8.3 g/dL   Albumin 4.0  3.5 - 5.2 g/dL   AST 22  0 - 37 U/L   ALT 14  0 - 35 U/L   Alkaline Phosphatase 64  39 - 117 U/L   Total Bilirubin 0.3  0.3 - 1.2 mg/dL   GFR calc non Af Amer 84 (*) >90 mL/min   GFR calc Af Amer >90  >90 mL/min  PROTIME-INR     Status: Abnormal   Collection Time   04/20/12  4:18 PM      Component Value Range   Prothrombin Time 21.2 (*) 11.6 - 15.2 seconds   INR 1.92 (*) 0.00 - 1.49  TROPONIN I     Status: Normal   Collection Time   04/20/12  7:16 PM      Component Value Range   Troponin I <0.30  <0.30 ng/mL  PROTIME-INR     Status: Abnormal   Collection Time   04/21/12  6:00 AM      Component Value Range   Prothrombin Time 22.4 (*) 11.6 - 15.2 seconds   INR 2.06 (*) 0.00 - 1.49  TROPONIN I     Status: Normal   Collection Time   04/21/12  6:00 AM      Component Value Range   Troponin I <0.30  <0.30 ng/mL    Signed: Harry Bark 04/21/2012, 2:15 PM   Time Spent on Discharge: > 45 minutes

## 2012-04-21 NOTE — Progress Notes (Signed)
ANTICOAGULATION CONSULT NOTE - Follow-Up Consult  Pharmacy Consult for warfarin Indication: atrial fibrillation  Allergies  Allergen Reactions  . Cephalexin   . Clarithromycin Hives and Itching  . Esomeprazole Magnesium     Takes Dexilant at home  . Hydrocodone   . Hydrocodone-Acetaminophen   . Ketek (Telithromycin)   . Latex   . Levaquin (Levofloxacin In D5w)   . Lipitor (Atorvastatin)   . Moxifloxacin   . Rabeprazole Sodium     Takes Dexilant at home  . Sulfonamide Derivatives     Labs:  Basename 04/21/12 0600 04/20/12 1916 04/20/12 1618  HGB -- -- 14.1  HCT -- -- 43.5  PLT -- -- 187  APTT -- -- --  LABPROT 22.4* -- 21.2*  INR 2.06* -- 1.92*  HEPARINUNFRC -- -- --  CREATININE -- -- 0.72  CKTOTAL -- -- --  CKMB -- -- --  TROPONINI <0.30 <0.30 <0.30    Estimated Creatinine Clearance: 67.5 ml/min (by C-G formula based on Cr of 0.72).   Assessment: 19 yof with history of afib on chronic coumadin present to mced with chest pressure and shortness of breath.   INR now therapeutic at 2.06, no bleeding noted  Goal of Therapy:  INR 2-3 Monitor platelets by anticoagulation protocol: Yes   Plan: 1) Resume home Coumadin 4 mg daily 2) Daily INR  Thank you. Okey Regal, PharmD  [phone]  04/21/2012,8:59 AM

## 2012-05-03 ENCOUNTER — Encounter: Payer: Self-pay | Admitting: Cardiology

## 2012-05-10 ENCOUNTER — Encounter: Payer: Self-pay | Admitting: Nurse Practitioner

## 2012-05-10 ENCOUNTER — Encounter: Payer: PRIVATE HEALTH INSURANCE | Admitting: Nurse Practitioner

## 2012-05-10 ENCOUNTER — Ambulatory Visit (INDEPENDENT_AMBULATORY_CARE_PROVIDER_SITE_OTHER): Payer: Medicare Other | Admitting: Nurse Practitioner

## 2012-05-10 VITALS — BP 148/76 | HR 56 | Ht 64.0 in | Wt 192.8 lb

## 2012-05-10 DIAGNOSIS — R079 Chest pain, unspecified: Secondary | ICD-10-CM

## 2012-05-10 NOTE — Progress Notes (Signed)
[name] Date of Birth: 1940-12-26 Medical Record #[id number]  History of Present Illness: Ms. Rademaker is seen back today for a post hospital visit. She is seen for Dr. Antoine Poche. She has chronic atrial fib and is on coumadin and amiodarone. She has also had a prior stroke. Her other issues are as outlined below.   She comes in today. She is here alone. Using her walker. Was in the hospital at the end of September after noting an elevated BP and having chest discomfort with nausea. This turned out to be costochondritis and GERD. No medicines were changed. She was sent back home. She did have some intermittent dyspnea and pulmonary referral was suggested. She says she has had asthma. Has already seen Dr. Christell Constant. Her blood pressure has been doing fine. She may get referred to pulmonary but Dr. Christell Constant is going to take care of that as well. She has less chest pain. It remains palpable. She uses Tylenol prn. Overall, she feels like she is ok and voices no complaint.   Current Outpatient Prescriptions on File Prior to Visit  Medication Sig Dispense Refill  . amiodarone (PACERONE) 200 MG tablet Take 200 mg by mouth daily.      . brimonidine (ALPHAGAN) 0.2 % ophthalmic solution Place 1 drop into both eyes 3 (three) times daily.       . calcium-vitamin D (OSCAL) 250-125 MG-UNIT per tablet Take 1 tablet by mouth daily.        . cholecalciferol (VITAMIN D) 1000 UNITS tablet Take 2,000 Units by mouth daily.      . ciclesonide (ALVESCO) 160 MCG/ACT inhaler Inhale 1 puff into the lungs 2 (two) times daily.        Marland Kitchen dexlansoprazole (DEXILANT) 60 MG capsule Take 60 mg by mouth daily.        Marland Kitchen diltiazem (CARDIZEM CD) 180 MG 24 hr capsule Take 180 mg by mouth 2 (two) times daily.        Marland Kitchen latanoprost (XALATAN) 0.005 % ophthalmic solution Place 1 drop into both eyes at bedtime.        . levalbuterol (XOPENEX HFA) 45 MCG/ACT inhaler Inhale 2 puffs into the lungs 2 (two) times daily.       Marland Kitchen levothyroxine  (SYNTHROID, LEVOTHROID) 50 MCG tablet Take 50 mcg by mouth daily.        Marland Kitchen loratadine (CLARITIN) 10 MG tablet Take 10 mg by mouth daily.        Marland Kitchen LORazepam (ATIVAN) 0.5 MG tablet Take 0.5-1 mg by mouth daily as needed. For anxiety      . montelukast (SINGULAIR) 10 MG tablet Take 10 mg by mouth daily.        . Multiple Vitamins-Minerals (ONE-A-DAY WEIGHT SMART ADVANCE) TABS Take 1 tablet by mouth daily.        . potassium chloride (K-DUR,KLOR-CON) 10 MEQ tablet Take 10 mEq by mouth daily.        Marland Kitchen warfarin (COUMADIN) 4 MG tablet Take 4 mg by mouth daily.        Allergies  Allergen Reactions  . Cephalexin   . Clarithromycin Hives and Itching  . Esomeprazole Magnesium     Takes Dexilant at home  . Hydrocodone   . Hydrocodone-Acetaminophen   . Ketek (Telithromycin)   . Latex   . Levaquin (Levofloxacin In D5w)   . Lipitor (Atorvastatin)   . Moxifloxacin   . Rabeprazole Sodium     Takes Dexilant at home  . Sulfonamide Derivatives  Past Medical History  Diagnosis Date  . Esophageal stricture   . Gastritis   . GERD (gastroesophageal reflux disease)   . Osteoarthritis   . Glaucoma(365)   . Rheumatoid arthritis   . Asthma   . Dysrhythmia     hx of irregular heart beat  . Shortness of breath   . Hypothyroidism   . Hypertension   . Pneumonia   . 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 Atrial fibrillation     Past Surgical History  Procedure Date  . Tubal ligation   . Appendectomy   . Right eye lid surgery   . Bilateral cataract extraction   . Cholecystectomy     History  Smoking status  . Never Smoker   Smokeless tobacco  . Never Used    History  Alcohol Use No    Family History  Problem Relation Age of Onset  . Colon cancer Neg Hx   . Diabetes Mother   . Diabetes Sister     Review of Systems: The review of systems is per the HPI.  All other systems were reviewed and are  negative.  Physical Exam: BP 148/76  Pulse 56  Ht 5\' 4"  (1.626 m)  Wt 192 lb 12.8 oz (87.454 kg)  BMI 33.09 kg/m2 Patient is very pleasant and in no acute distress. She is obese. Skin is warm and dry. Color is normal.  HEENT is unremarkable. Normocephalic/atraumatic. PERRL. Sclera are nonicteric. Neck is supple. No masses. No JVD. Lungs are clear. Cardiac exam shows a regular rate and rhythm. Mild chest wall pain that is reproducible. Abdomen is soft. Extremities are without edema. Gait and ROM are intact. No gross neurologic deficits noted.  LABORATORY DATA:  Lab Results  Component Value Date   WBC 6.9 04/20/2012   HGB 14.1 04/20/2012   HCT 43.5 04/20/2012   PLT 187 04/20/2012   GLUCOSE 97 04/20/2012   CHOL 103 09/30/2011   TRIG 82 09/30/2011   HDL 56 09/30/2011   LDLCALC 31 09/30/2011   ALT 14 04/20/2012   AST 22 04/20/2012   NA 141 04/20/2012   K 3.9 04/20/2012   CL 106 04/20/2012   CREATININE 0.72 04/20/2012   BUN 10 04/20/2012   CO2 20 04/20/2012   TSH 1.945 09/30/2011   INR 2.06* 04/21/2012   HGBA1C 5.7* 10/02/2011     Assessment / Plan: 1. Chest pain - felt to be musculoskeletal in origin. She is improving.   2. Elevated BP - she has been monitoring at home. Dr. Christell Constant has reviewed her readings and she was told that it was ok. No change in her current medicines.   3. Dyspnea - she will be seeing pulmonary per Dr.Moore.  From our standpoint, she is felt to be doing well. We will see her back at her regular time in December with Dr. Antoine Poche. She has planned follow up with Dr. Christell Constant as well.  Patient is agreeable to this plan and will call if any problems develop in the interim.

## 2012-05-10 NOTE — Patient Instructions (Addendum)
Stay on your current medicines  Continue to check your blood pressure at home a couple of times a week and keep a diary  Dr. Antoine Poche will see you as planned in December  Call the [facility] office at [phone] if you have any questions, problems or concerns.

## 2012-05-12 ENCOUNTER — Encounter: Payer: PRIVATE HEALTH INSURANCE | Admitting: Nurse Practitioner

## 2012-05-25 ENCOUNTER — Other Ambulatory Visit: Payer: Self-pay

## 2012-05-25 DIAGNOSIS — J45909 Unspecified asthma, uncomplicated: Secondary | ICD-10-CM

## 2012-06-01 ENCOUNTER — Encounter (HOSPITAL_COMMUNITY): Payer: Medicare Other

## 2012-06-12 ENCOUNTER — Ambulatory Visit (HOSPITAL_COMMUNITY)
Admission: RE | Admit: 2012-06-12 | Discharge: 2012-06-12 | Disposition: A | Discharge status: Home / Self Care | Payer: Medicare Other | Source: Ambulatory Visit | Attending: Pulmonary Disease | Admitting: Pulmonary Disease

## 2012-06-12 ENCOUNTER — Encounter (HOSPITAL_COMMUNITY): Payer: Medicare Other

## 2012-06-12 DIAGNOSIS — R0602 Shortness of breath: Secondary | ICD-10-CM | POA: Insufficient documentation

## 2012-06-12 DIAGNOSIS — J45909 Unspecified asthma, uncomplicated: Secondary | ICD-10-CM | POA: Insufficient documentation

## 2012-06-12 MED ORDER — ALBUTEROL SULFATE (5 MG/ML) 0.5% IN NEBU
2.5000 mg | INHALATION_SOLUTION | Freq: Once | RESPIRATORY_TRACT | Status: AC
  Administered 2012-06-12: 2.5 mg via RESPIRATORY_TRACT

## 2012-06-13 NOTE — Procedures (Signed)
NAME:  Stacey Davenport, Stacey Davenport NO.:  [id number]  MEDICAL RECORD NO.:  [id number]  LOCATION:  RESP                          FACILITY:  [facility]  PHYSICIAN:  Anayiah Howden L. Juanetta Gosling, M.D.DATE OF BIRTH:  1940/12/05  DATE OF PROCEDURE: DATE OF DISCHARGE:  06/12/2012                           PULMONARY FUNCTION TEST   Reason for pulmonary function testing is asthma/shortness of breath. 1. Spirometry shows no ventilatory defect and minimal if any airflow     obstruction. 2. Lung volumes are normal. 3. DLCO is normal. 4. Airway resistance is normal. 5. There is no significant bronchodilator improvement. 6. This study is essentially normal.     Peyson Delao L. Juanetta Gosling, M.D.     EL[facility]/MEDQ  D:  06/13/2012  T:  06/13/2012  Job:  [id number]

## 2012-06-21 LAB — PULMONARY FUNCTION TEST

## 2012-07-12 ENCOUNTER — Encounter: Payer: Self-pay | Admitting: Cardiology

## 2012-07-12 ENCOUNTER — Ambulatory Visit (INDEPENDENT_AMBULATORY_CARE_PROVIDER_SITE_OTHER): Payer: Medicare Other | Admitting: Cardiology

## 2012-07-12 VITALS — BP 140/80 | HR 67 | Ht 65.0 in | Wt 197.0 lb

## 2012-07-12 DIAGNOSIS — R079 Chest pain, unspecified: Secondary | ICD-10-CM

## 2012-07-12 NOTE — Progress Notes (Signed)
HPI The patient presents for followup of her atrial fibrillation. She has had this documented previously. Since I last saw her she has had none of the symptoms such as a three-hour episode she had previously. She denies any significant palpitations and she has had no presyncope or syncope. She denies any chest discomfort. He does get some shortness of breath and I reviewed some pulmonary function tests which demonstrated only mild airway obstruction. She saw Dr. Juanetta Gosling and was thought to have some mild asthma. However, albuterol makes her anxious.   Allergies  Allergen Reactions  . Cephalexin   . Clarithromycin Hives and Itching  . Esomeprazole Magnesium     Takes Dexilant at home  . Hydrocodone   . Hydrocodone-Acetaminophen   . Ketek (Telithromycin)   . Latex   . Levaquin (Levofloxacin In D5w)   . Lipitor (Atorvastatin)   . Moxifloxacin   . Rabeprazole Sodium     Takes Dexilant at home  . Sulfonamide Derivatives     Current Outpatient Prescriptions  Medication Sig Dispense Refill  . amiodarone (PACERONE) 200 MG tablet Take 200 mg by mouth daily.      . brimonidine (ALPHAGAN) 0.2 % ophthalmic solution Place 1 drop into both eyes 3 (three) times daily.       . calcium-vitamin D (OSCAL) 250-125 MG-UNIT per tablet Take 1 tablet by mouth daily.        . cholecalciferol (VITAMIN D) 1000 UNITS tablet Take 2,000 Units by mouth daily.      . ciclesonide (ALVESCO) 160 MCG/ACT inhaler Inhale 1 puff into the lungs 2 (two) times daily.        Marland Kitchen dexlansoprazole (DEXILANT) 60 MG capsule Take 60 mg by mouth daily.        Marland Kitchen diltiazem (CARDIZEM CD) 180 MG 24 hr capsule Take 180 mg by mouth 2 (two) times daily.        Marland Kitchen latanoprost (XALATAN) 0.005 % ophthalmic solution Place 1 drop into both eyes at bedtime.        . levalbuterol (XOPENEX HFA) 45 MCG/ACT inhaler Inhale 2 puffs into the lungs 2 (two) times daily.       Marland Kitchen levothyroxine (SYNTHROID, LEVOTHROID) 50 MCG tablet Take 50 mcg by mouth  daily.        Marland Kitchen loratadine (CLARITIN) 10 MG tablet Take 10 mg by mouth daily.        Marland Kitchen LORazepam (ATIVAN) 0.5 MG tablet Take 0.5-1 mg by mouth daily as needed. For anxiety      . montelukast (SINGULAIR) 10 MG tablet Take 10 mg by mouth daily.        . Multiple Vitamins-Minerals (ONE-A-DAY WEIGHT SMART ADVANCE) TABS Take 1 tablet by mouth daily.        . potassium chloride (K-DUR,KLOR-CON) 10 MEQ tablet Take 10 mEq by mouth daily.        Marland Kitchen warfarin (COUMADIN) 4 MG tablet Take 4 mg by mouth daily.        Past Medical History  Diagnosis Date  . Esophageal stricture   . Gastritis   . GERD (gastroesophageal reflux disease)   . Osteoarthritis   . Glaucoma(365)   . Rheumatoid arthritis   . Asthma   . Dysrhythmia     hx of irregular heart beat  . Shortness of breath   . Hypothyroidism   . Hypertension   . Pneumonia   . CVA (cerebral infarction)     a. 09/2011 MRI tiny bilat centrum semiovale acute  non hemorrhagic infarcts  . Atrial fibrillation     Past Surgical History  Procedure Date  . Tubal ligation   . Appendectomy   . Right eye lid surgery   . Bilateral cataract extraction   . Cholecystectomy     ROS:  As stated in the HPI and negative for all other systems.  PHYSICAL EXAM BP 140/80  Pulse 67  Ht 5\' 5"  (1.651 m)  Wt 197 lb (89.359 kg)  BMI 32.78 kg/m2 GENERAL:  Well appearing NECK:  No jugular venous distention, waveform within normal limits, carotid upstroke brisk and symmetric, no bruits, no thyromegaly LUNGS:  Clear to auscultation bilaterally CHEST:  Unremarkable HEART:  PMI not displaced or sustained,S1 and S2 within normal limits, no S3, no clicks, no rubs, no murmurs, irregular ABD:  Flat, positive bowel sounds normal in frequency in pitch, no bruits, no rebound, no guarding, no midline pulsatile mass, no hepatomegaly, no splenomegaly EXT:  2 plus pulses throughout, no edema, no cyanosis no clubbing   ASSESSMENT AND PLAN  Atrial fibrillation -  At this  point she will continue with amiodarone at 200 mg daily. At this point she seems to be tolerating this medication though she is concerned about light sensitivity that she is having with her eyes. She may  In the future want to switch to Tikosyn  Essential hypertension, benign -  The blood pressure is at target. No change in medications is indicated. We will continue with therapeutic lifestyle changes (TLC).

## 2012-07-12 NOTE — Patient Instructions (Signed)
Your physician wants you to follow-up in 6 MONTHS with Dr Antoine Poche in the [facility].  You will receive a reminder letter in the mail two months in advance. If you don't receive a letter, please call our office to schedule the follow-up appointment.  Your physician recommends that you continue on your current medications as directed. Please refer to the Current Medication list given to you today.

## 2012-10-12 ENCOUNTER — Other Ambulatory Visit: Payer: Self-pay

## 2012-10-12 MED ORDER — AMIODARONE HCL 200 MG PO TABS: 200.0000 mg | ORAL_TABLET | Freq: Every day | ORAL | Status: DC

## 2012-10-12 NOTE — Telephone Encounter (Signed)
..   Requested Prescriptions   Signed Prescriptions Disp Refills  . amiodarone (PACERONE) 200 MG tablet 30 tablet 6    Sig: Take 1 tablet (200 mg total) by mouth daily.    Authorizing Provider: Rollene Rotunda    Ordering User: Christella Hartigan, Chanel Mcadams Judie Petit

## 2012-10-24 ENCOUNTER — Ambulatory Visit (INDEPENDENT_AMBULATORY_CARE_PROVIDER_SITE_OTHER): Payer: Medicare Other | Admitting: Pharmacist

## 2012-10-24 DIAGNOSIS — I4891 Unspecified atrial fibrillation: Secondary | ICD-10-CM

## 2012-10-24 NOTE — Patient Instructions (Signed)
Anticoagulation Dose Instructions as of 10/24/2012     Stacey Davenport Tue Wed Thu Fri Sat   New Dose 2 mg 2 mg 4 mg 2 mg 4 mg 2 mg 4 mg     INR was 2.1 today

## 2012-11-09 ENCOUNTER — Other Ambulatory Visit: Payer: Self-pay

## 2012-11-09 MED ORDER — LEVOTHYROXINE SODIUM 75 MCG PO TABS: ORAL_TABLET | ORAL | Status: DC

## 2012-11-13 ENCOUNTER — Encounter (HOSPITAL_COMMUNITY): Payer: Self-pay | Admitting: *Deleted

## 2012-11-13 ENCOUNTER — Emergency Department (HOSPITAL_COMMUNITY): Payer: Medicare Other

## 2012-11-13 ENCOUNTER — Encounter: Payer: Self-pay | Admitting: Emergency Medicine

## 2012-11-13 ENCOUNTER — Encounter: Payer: Self-pay | Admitting: *Deleted

## 2012-11-13 ENCOUNTER — Telehealth: Payer: Self-pay | Admitting: Family Medicine

## 2012-11-13 ENCOUNTER — Emergency Department (HOSPITAL_COMMUNITY)
Admission: EM | Admit: 2012-11-13 | Discharge: 2012-11-13 | Disposition: A | Discharge status: Home / Self Care | Payer: Medicare Other | Attending: Emergency Medicine | Admitting: Emergency Medicine

## 2012-11-13 DIAGNOSIS — S0990XA Unspecified injury of head, initial encounter: Secondary | ICD-10-CM | POA: Insufficient documentation

## 2012-11-13 DIAGNOSIS — S7000XA Contusion of unspecified hip, initial encounter: Secondary | ICD-10-CM | POA: Insufficient documentation

## 2012-11-13 DIAGNOSIS — E039 Hypothyroidism, unspecified: Secondary | ICD-10-CM | POA: Insufficient documentation

## 2012-11-13 DIAGNOSIS — Z8719 Personal history of other diseases of the digestive system: Secondary | ICD-10-CM | POA: Insufficient documentation

## 2012-11-13 DIAGNOSIS — Z8673 Personal history of transient ischemic attack (TIA), and cerebral infarction without residual deficits: Secondary | ICD-10-CM | POA: Insufficient documentation

## 2012-11-13 DIAGNOSIS — Z7901 Long term (current) use of anticoagulants: Secondary | ICD-10-CM | POA: Insufficient documentation

## 2012-11-13 DIAGNOSIS — Y9389 Activity, other specified: Secondary | ICD-10-CM | POA: Insufficient documentation

## 2012-11-13 DIAGNOSIS — Z8709 Personal history of other diseases of the respiratory system: Secondary | ICD-10-CM | POA: Insufficient documentation

## 2012-11-13 DIAGNOSIS — M069 Rheumatoid arthritis, unspecified: Secondary | ICD-10-CM | POA: Insufficient documentation

## 2012-11-13 DIAGNOSIS — W010XXA Fall on same level from slipping, tripping and stumbling without subsequent striking against object, initial encounter: Secondary | ICD-10-CM | POA: Insufficient documentation

## 2012-11-13 DIAGNOSIS — Z8701 Personal history of pneumonia (recurrent): Secondary | ICD-10-CM | POA: Insufficient documentation

## 2012-11-13 DIAGNOSIS — I4891 Unspecified atrial fibrillation: Secondary | ICD-10-CM | POA: Insufficient documentation

## 2012-11-13 DIAGNOSIS — IMO0002 Reserved for concepts with insufficient information to code with codable children: Secondary | ICD-10-CM | POA: Insufficient documentation

## 2012-11-13 DIAGNOSIS — S5000XA Contusion of unspecified elbow, initial encounter: Secondary | ICD-10-CM | POA: Insufficient documentation

## 2012-11-13 DIAGNOSIS — S5001XA Contusion of right elbow, initial encounter: Secondary | ICD-10-CM

## 2012-11-13 DIAGNOSIS — K219 Gastro-esophageal reflux disease without esophagitis: Secondary | ICD-10-CM | POA: Insufficient documentation

## 2012-11-13 DIAGNOSIS — Y9289 Other specified places as the place of occurrence of the external cause: Secondary | ICD-10-CM | POA: Insufficient documentation

## 2012-11-13 DIAGNOSIS — M436 Torticollis: Secondary | ICD-10-CM | POA: Insufficient documentation

## 2012-11-13 DIAGNOSIS — H409 Unspecified glaucoma: Secondary | ICD-10-CM | POA: Insufficient documentation

## 2012-11-13 DIAGNOSIS — Z79899 Other long term (current) drug therapy: Secondary | ICD-10-CM | POA: Insufficient documentation

## 2012-11-13 DIAGNOSIS — Z8679 Personal history of other diseases of the circulatory system: Secondary | ICD-10-CM | POA: Insufficient documentation

## 2012-11-13 DIAGNOSIS — J45909 Unspecified asthma, uncomplicated: Secondary | ICD-10-CM | POA: Insufficient documentation

## 2012-11-13 DIAGNOSIS — I10 Essential (primary) hypertension: Secondary | ICD-10-CM | POA: Insufficient documentation

## 2012-11-13 DIAGNOSIS — M1990 Unspecified osteoarthritis, unspecified site: Secondary | ICD-10-CM | POA: Insufficient documentation

## 2012-11-13 DIAGNOSIS — W19XXXA Unspecified fall, initial encounter: Secondary | ICD-10-CM

## 2012-11-13 LAB — PROTIME-INR
INR: 2.69 — ABNORMAL HIGH (ref 0.00–1.49)
Prothrombin Time: 27.3 seconds — ABNORMAL HIGH (ref 11.6–15.2)

## 2012-11-13 LAB — APTT: aPTT: 43 seconds — ABNORMAL HIGH (ref 24–37)

## 2012-11-13 MED ORDER — TRAMADOL HCL 50 MG PO TABS: 50.0000 mg | ORAL_TABLET | Freq: Four times a day (QID) | ORAL | Status: DC | PRN

## 2012-11-13 NOTE — ED Notes (Signed)
Pt assisted to the bathroom with use of personal walker.  Pt able to ambulate with no assistance from staff, but monitored due to high fall risk

## 2012-11-13 NOTE — ED Notes (Signed)
Pt taken to radiology

## 2012-11-13 NOTE — ED Notes (Signed)
Sunday evening pt was on 2 step and her R leg gave out.  She fell back, landing on cement.  Pt with hematoma to R side of head and R forearm.  No active bleeding, no LOC.  Pt is on coumadin for "irregular heartbeat".  Pt awoke at 3 am with headache.  Called her pcp and he stated to come to ED.  Denies nausea and is ao x 4.

## 2012-11-15 NOTE — ED Provider Notes (Signed)
History     CSN: [id number]  Arrival date & time 11/13/12  1314   First MD Initiated Contact with Patient 11/13/12 1741      Chief Complaint  Patient presents with  . Fall    on anticoagulants    (Consider location/radiation/quality/duration/timing/severity/associated sxs/prior treatment) Patient is a 72 y.o. female presenting with fall. The history is provided by the patient and a relative.  Fall Associated symptoms include headaches. Pertinent negatives include no fever, no abdominal pain, no nausea, no vomiting and no hematuria.  S/P fall backwards last evening around 1900, no LOC, no syncope. Hit back of head, right elbow, right hip. Patient on Coumadin, no active bleeding, bruising to right elbow, shoulder, and back of head. No chest or abdominal pain. Pain to head, elbow, neck, and hip is 5/10 sharp ache, made worse with movement. No back pain.   Past Medical History  Diagnosis Date  . Esophageal stricture   . Gastritis   . GERD (gastroesophageal reflux disease)   . Osteoarthritis   . Glaucoma(365)   . Rheumatoid arthritis   . Asthma   . Dysrhythmia     hx of irregular heart beat  . Shortness of breath   . Hypothyroidism   . Hypertension   . Pneumonia   . 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 Atrial fibrillation     Past Surgical History  Procedure Laterality Date  . Tubal ligation    . Appendectomy    . Right eye lid surgery    . Bilateral cataract extraction    . Cholecystectomy      Family History  Problem Relation Age of Onset  . Colon cancer Neg Hx   . Diabetes Mother   . Diabetes Sister     History  Substance Use Topics  . Smoking status: Never Smoker   . Smokeless tobacco: Never Used  . Alcohol Use: No    OB History   Grav Para Term Preterm Abortions TAB SAB Ect Mult Living                  Review of Systems  Constitutional: Negative for fever.  HENT: Positive for  neck stiffness. Negative for nosebleeds and facial swelling.   Eyes: Negative for visual disturbance.  Respiratory: Negative for shortness of breath.   Cardiovascular: Negative for chest pain.  Gastrointestinal: Negative for nausea, vomiting and abdominal pain.  Genitourinary: Negative for hematuria.  Musculoskeletal: Negative for back pain.  Skin: Negative for rash.  Neurological: Positive for headaches.  Hematological: Bruises/bleeds easily.  Psychiatric/Behavioral: Negative for confusion.    Allergies  Cephalexin; Clarithromycin; Esomeprazole magnesium; Hydrocodone; Hydrocodone-acetaminophen; Ketek; Latex; Levaquin; Lipitor; Moxifloxacin; Rabeprazole sodium; and Sulfonamide derivatives  Home Medications   Current Outpatient Rx  Name  Route  Sig  Dispense  Refill  . acetaminophen (TYLENOL) 325 MG tablet   Oral   Take 650 mg by mouth every 6 (six) hours as needed for pain.         Marland Kitchen amiodarone (PACERONE) 200 MG tablet   Oral   Take 1 tablet (200 mg total) by mouth daily.   30 tablet   6   . brimonidine (ALPHAGAN) 0.2 % ophthalmic solution   Both Eyes   Place 1 drop into both eyes 3 (three) times daily.          . calcium-vitamin D (OSCAL) 250-125 MG-UNIT per tablet   Oral  Take 1 tablet by mouth daily.           . cholecalciferol (VITAMIN D) 1000 UNITS tablet   Oral   Take 2,000 Units by mouth daily.         . ciclesonide (ALVESCO) 160 MCG/ACT inhaler   Inhalation   Inhale 1 puff into the lungs 2 (two) times daily.           Marland Kitchen dexlansoprazole (DEXILANT) 60 MG capsule   Oral   Take 60 mg by mouth daily.           Marland Kitchen diltiazem (CARDIZEM CD) 180 MG 24 hr capsule   Oral   Take 180 mg by mouth 2 (two) times daily.           . fluticasone (FLONASE) 50 MCG/ACT nasal spray   Nasal   Place 1 spray into the nose daily.          Marland Kitchen latanoprost (XALATAN) 0.005 % ophthalmic solution   Both Eyes   Place 1 drop into both eyes at bedtime.           .  levalbuterol (XOPENEX HFA) 45 MCG/ACT inhaler   Inhalation   Inhale 2 puffs into the lungs 2 (two) times daily.          Marland Kitchen levothyroxine (SYNTHROID, LEVOTHROID) 75 MCG tablet      Take once daily   30 tablet   8   . loratadine (CLARITIN) 10 MG tablet   Oral   Take 10 mg by mouth daily.           Marland Kitchen LORazepam (ATIVAN) 0.5 MG tablet   Oral   Take 0.5-1 mg by mouth daily as needed. For anxiety         . montelukast (SINGULAIR) 10 MG tablet   Oral   Take 10 mg by mouth daily.           . Multiple Vitamins-Minerals (ONE-A-DAY WEIGHT SMART ADVANCE) TABS   Oral   Take 1 tablet by mouth daily.           . potassium chloride (K-DUR,KLOR-CON) 10 MEQ tablet   Oral   Take 10 mEq by mouth daily.           . predniSONE (DELTASONE) 5 MG tablet   Oral   Take 5 mg by mouth daily.          Marland Kitchen warfarin (COUMADIN) 4 MG tablet   Oral   Take 2-4 mg by mouth daily. Take 1 tablet Tuesday, Thursday and Saturday then take 1/2 tablet on Monday, Wednesday, Friday and Sunday         . traMADol (ULTRAM) 50 MG tablet   Oral   Take 1 tablet (50 mg total) by mouth every 6 (six) hours as needed for pain.   15 tablet   0     BP 172/79  Pulse 61  Temp(Src) 97.7 F (36.5 C) (Oral)  Resp 18  Ht 5\' 4"  (1.626 m)  Wt 198 lb (89.812 kg)  BMI 33.97 kg/m2  SpO2 98%  Physical Exam  Nursing note and vitals reviewed. Constitutional: She is oriented to person, place, and time. She appears well-developed and well-nourished. No distress.  HENT:  Head: Normocephalic.  Mouth/Throat: Oropharynx is clear and moist.  Contusions and swelling to back of head. No bleeding.  Eyes: Conjunctivae and EOM are normal. Pupils are equal, round, and reactive to light.  Neck: Normal range of motion. Neck supple.  Cardiovascular:  Normal rate, regular rhythm and normal heart sounds.   No murmur heard. Pulmonary/Chest: Effort normal and breath sounds normal. No respiratory distress.  Abdominal: Soft.  Bowel sounds are normal. There is no tenderness.  Musculoskeletal: Normal range of motion. She exhibits tenderness.  Tender to right elbow, hip, and mild tenderness to right shoulder. All with good ROM and vascular intact.   Neurological: She is alert and oriented to person, place, and time. No cranial nerve deficit. She exhibits normal muscle tone. Coordination normal.  Skin: Skin is warm.  Contusion to right elbow and shoulder area. Contusion to elbow 10 cm and shoulder 4 cm.    ED Course  Procedures (including critical care time)  Labs Reviewed  PROTIME-INR - Abnormal; Notable for the following:    Prothrombin Time 27.3 (*)    INR 2.69 (*)    All other components within normal limits  APTT - Abnormal; Notable for the following:    aPTT 43 (*)    All other components within normal limits   Dg Elbow Complete Right  11/13/2012  *RADIOLOGY REPORT*  Clinical Data: Fall  RIGHT ELBOW - COMPLETE 3+ VIEW  Comparison: None.  Findings: Negative for fracture.  Normal alignment.  No significant joint space or effusion.  IMPRESSION: Negative   Original Report Authenticated By: Janeece Riggers, M.D.    Dg Hip Bilateral W/pelvis  11/13/2012  *RADIOLOGY REPORT*  Clinical Data: Fall  BILATERAL HIP WITH PELVIS - 4+ VIEW  Comparison: None.  Findings: Both hips are in normal alignment.  No pelvic fracture. No hip fracture.  Hip joints are normal.  Lumbar scoliosis and degenerative change.  IMPRESSION: Negative for fracture.   Original Report Authenticated By: Janeece Riggers, M.D.    Ct Head Wo Contrast  11/13/2012  *RADIOLOGY REPORT*  Clinical Data:  Fall with right-sided scalp hematoma.  Chronic anticoagulation for atrial fibrillation.  CT HEAD WITHOUT CONTRAST CT CERVICAL SPINE WITHOUT CONTRAST  Technique:  Multidetector CT imaging of the head and cervical spine was performed following the standard protocol without intravenous contrast.  Multiplanar CT image reconstructions of the cervical spine were also  generated.  Comparison:  Prior head CT on 09/30/2011  CT HEAD  Findings: Stable small vessel disease. The brain demonstrates no evidence of hemorrhage, infarction, edema, mass effect, extra-axial fluid collection, hydrocephalus or mass lesion.  Focal scalp hematoma over the right posterior parietal vertex.  No evidence of underlying skull fracture.  IMPRESSION: Right posterior parietal scalp hematoma.  No fracture or intracranial abnormalities.  CT CERVICAL SPINE  Findings: Moderately advanced cervical spondylosis present which is most severe at C4-5, C5-6 and C6-7.  Significant facet hypertrophy present, especially on the left side of the cervical spine.  No evidence of acute fracture or subluxation.  No soft tissue swelling identified.  The visualized airway is unremarkable.  IMPRESSION: Moderately advanced cervical spondylosis.  No evidence of acute cervical injury.   Original Report Authenticated By: Irish Lack, M.D.    Ct Cervical Spine Wo Contrast  11/13/2012  *RADIOLOGY REPORT*  Clinical Data:  Fall with right-sided scalp hematoma.  Chronic anticoagulation for atrial fibrillation.  CT HEAD WITHOUT CONTRAST CT CERVICAL SPINE WITHOUT CONTRAST  Technique:  Multidetector CT imaging of the head and cervical spine was performed following the standard protocol without intravenous contrast.  Multiplanar CT image reconstructions of the cervical spine were also generated.  Comparison:  Prior head CT on 09/30/2011  CT HEAD  Findings: Stable small vessel disease. The brain demonstrates no evidence  of hemorrhage, infarction, edema, mass effect, extra-axial fluid collection, hydrocephalus or mass lesion.  Focal scalp hematoma over the right posterior parietal vertex.  No evidence of underlying skull fracture.  IMPRESSION: Right posterior parietal scalp hematoma.  No fracture or intracranial abnormalities.  CT CERVICAL SPINE  Findings: Moderately advanced cervical spondylosis present which is most severe at C4-5,  C5-6 and C6-7.  Significant facet hypertrophy present, especially on the left side of the cervical spine.  No evidence of acute fracture or subluxation.  No soft tissue swelling identified.  The visualized airway is unremarkable.  IMPRESSION: Moderately advanced cervical spondylosis.  No evidence of acute cervical injury.   Original Report Authenticated By: Irish Lack, M.D.    Results for orders placed during the hospital encounter of 11/13/12  PROTIME-INR      Result Value Range   Prothrombin Time 27.3 (*) 11.6 - 15.2 seconds   INR 2.69 (*) 0.00 - 1.49  APTT      Result Value Range   aPTT 43 (*) 24 - 37 seconds     1. Fall, initial encounter   2. Head injury, initial encounter   3. Contusion, elbow, right, initial encounter   4. Contusion, hip, unspecified laterality, initial encounter       MDM  S/P fall work up negative for significant head injury, c-spine injury, or extremity bony injury.  INR therapeutic.           Shelda Jakes, MD 11/15/12 [phone]

## 2012-11-20 ENCOUNTER — Ambulatory Visit (INDEPENDENT_AMBULATORY_CARE_PROVIDER_SITE_OTHER): Payer: Medicare Other | Admitting: Family Medicine

## 2012-11-20 ENCOUNTER — Encounter: Payer: Self-pay | Admitting: Family Medicine

## 2012-11-20 VITALS — BP 146/78 | HR 64 | Temp 96.4°F | Ht 66.0 in | Wt 200.8 lb

## 2012-11-20 DIAGNOSIS — T07XXXA Unspecified multiple injuries, initial encounter: Secondary | ICD-10-CM

## 2012-11-20 NOTE — Progress Notes (Signed)
  Subjective:    Patient ID: Stacey Davenport, female    DOB: 27-Dec-1940, 72 y.o.   MRN: [id number]  HPI Patient sustained fall 8 days ago at son's home.Went to ER. Received multiple xrays of head,pelvis, hips and rt elbow.Also C spine. There was no acute fractures.today she presents with soreness and resolving contusions in multiple areas,   Review of Systems  Musculoskeletal: Positive for back pain (mid back) and arthralgias (R forearm).  Neurological: Positive for headaches (daily since fall, slowly improving).       Objective:   Physical Exam  Nursing note and vitals reviewed. Constitutional: She is oriented to person, place, and time. She appears well-developed and well-nourished.  HENT:  Head: Normocephalic.  She a resolving hematoma to her head with bruising down the right side of her neck  Eyes: Conjunctivae and EOM are normal. Pupils are equal, round, and reactive to light. Right eye exhibits no discharge. Left eye exhibits no discharge.  Neck: Normal range of motion. Neck supple.  Cardiovascular: Normal rate, regular rhythm and normal heart sounds.   No murmur heard. Pulmonary/Chest: Effort normal and breath sounds normal.  Musculoskeletal: Normal range of motion.  Good strength  Bilaterally upper and lower extremities.Resolving large contusion of right arm and forearm.  Small knot at point of injury on forearm.    Neurological: She is alert and oriented to person, place, and time.  Skin: Skin is warm and dry. No erythema.  Psychiatric: She has a normal mood and affect. Her behavior is normal. Judgment and thought content normal.          Assessment & Plan:  Multiple contusions secondary to a fall 8 days ago.  Patient Instructions  Reassured, warm wet compresses to areas that are sore. We will rck all contusions on scheduled visit in May.

## 2012-11-20 NOTE — Patient Instructions (Signed)
Reassured, warm wet compresses to areas that are sore. We will rck all contusions on scheduled visit in May.

## 2012-12-06 ENCOUNTER — Encounter: Payer: Self-pay | Admitting: Family Medicine

## 2012-12-06 ENCOUNTER — Ambulatory Visit (INDEPENDENT_AMBULATORY_CARE_PROVIDER_SITE_OTHER): Payer: Medicare Other | Admitting: Family Medicine

## 2012-12-06 VITALS — BP 133/72 | HR 64 | Temp 97.2°F | Ht 66.0 in | Wt 199.0 lb

## 2012-12-06 DIAGNOSIS — E785 Hyperlipidemia, unspecified: Secondary | ICD-10-CM | POA: Insufficient documentation

## 2012-12-06 DIAGNOSIS — E039 Hypothyroidism, unspecified: Secondary | ICD-10-CM

## 2012-12-06 DIAGNOSIS — I4891 Unspecified atrial fibrillation: Secondary | ICD-10-CM

## 2012-12-06 DIAGNOSIS — M47817 Spondylosis without myelopathy or radiculopathy, lumbosacral region: Secondary | ICD-10-CM

## 2012-12-06 DIAGNOSIS — R5383 Other fatigue: Secondary | ICD-10-CM

## 2012-12-06 DIAGNOSIS — M47816 Spondylosis without myelopathy or radiculopathy, lumbar region: Secondary | ICD-10-CM

## 2012-12-06 DIAGNOSIS — J45909 Unspecified asthma, uncomplicated: Secondary | ICD-10-CM

## 2012-12-06 DIAGNOSIS — E559 Vitamin D deficiency, unspecified: Secondary | ICD-10-CM

## 2012-12-06 DIAGNOSIS — Z8673 Personal history of transient ischemic attack (TIA), and cerebral infarction without residual deficits: Secondary | ICD-10-CM | POA: Insufficient documentation

## 2012-12-06 DIAGNOSIS — I10 Essential (primary) hypertension: Secondary | ICD-10-CM

## 2012-12-06 DIAGNOSIS — I639 Cerebral infarction, unspecified: Secondary | ICD-10-CM

## 2012-12-06 DIAGNOSIS — I6350 Cerebral infarction due to unspecified occlusion or stenosis of unspecified cerebral artery: Secondary | ICD-10-CM

## 2012-12-06 DIAGNOSIS — K219 Gastro-esophageal reflux disease without esophagitis: Secondary | ICD-10-CM

## 2012-12-06 DIAGNOSIS — R0602 Shortness of breath: Secondary | ICD-10-CM

## 2012-12-06 LAB — BASIC METABOLIC PANEL WITH GFR
CO2: 24 mEq/L (ref 19–32)
Calcium: 9.3 mg/dL (ref 8.4–10.5)
Chloride: 108 mEq/L (ref 96–112)
Creat: 0.83 mg/dL (ref 0.50–1.10)
GFR, Est Non African American: 70 mL/min
Sodium: 142 mEq/L (ref 135–145)

## 2012-12-06 LAB — HEPATIC FUNCTION PANEL
AST: 15 U/L (ref 0–37)
Albumin: 4.3 g/dL (ref 3.5–5.2)
Bilirubin, Direct: 0.1 mg/dL (ref 0.0–0.3)
Total Bilirubin: 0.5 mg/dL (ref 0.3–1.2)

## 2012-12-06 LAB — THYROID PANEL WITH TSH
T3 Uptake: 38.1 % — ABNORMAL HIGH (ref 22.5–37.0)
T4, Total: 9.9 ug/dL (ref 5.0–12.5)

## 2012-12-06 LAB — POCT CBC
Hemoglobin: 14.3 g/dL (ref 12.2–16.2)
Lymph, poc: 2.1 (ref 0.6–3.4)
MCH, POC: 31 pg (ref 27–31.2)
MCHC: 33.2 g/dL (ref 31.8–35.4)
MPV: 7.5 fL (ref 0–99.8)
POC Granulocyte: 4.1 (ref 2–6.9)
POC LYMPH PERCENT: 32.6 %L (ref 10–50)
Platelet Count, POC: 183 10*3/uL (ref 142–424)
RBC: 4.6 M/uL (ref 4.04–5.48)

## 2012-12-06 LAB — PROTIME-INR: Prothrombin Time: 19.3 seconds — ABNORMAL HIGH (ref 11.6–15.2)

## 2012-12-06 MED ORDER — MONTELUKAST SODIUM 10 MG PO TABS: 10.0000 mg | ORAL_TABLET | Freq: Every day | ORAL | Status: DC

## 2012-12-06 MED ORDER — LORAZEPAM 0.5 MG PO TABS: 0.5000 mg | ORAL_TABLET | Freq: Every day | ORAL | Status: DC | PRN

## 2012-12-06 MED ORDER — DILTIAZEM HCL ER COATED BEADS 180 MG PO CP24: 180.0000 mg | ORAL_CAPSULE | Freq: Two times a day (BID) | ORAL | Status: DC

## 2012-12-06 MED ORDER — POTASSIUM CHLORIDE CRYS ER 10 MEQ PO TBCR: 10.0000 meq | EXTENDED_RELEASE_TABLET | Freq: Every day | ORAL | Status: DC

## 2012-12-06 MED ORDER — AMIODARONE HCL 200 MG PO TABS: 200.0000 mg | ORAL_TABLET | Freq: Every day | ORAL | Status: DC

## 2012-12-06 MED ORDER — DEXLANSOPRAZOLE 60 MG PO CPDR: 60.0000 mg | DELAYED_RELEASE_CAPSULE | Freq: Every day | ORAL | Status: DC

## 2012-12-06 MED ORDER — LEVOTHYROXINE SODIUM 75 MCG PO TABS: ORAL_TABLET | ORAL | Status: DC

## 2012-12-06 MED ORDER — WARFARIN SODIUM 4 MG PO TABS: 2.0000 mg | ORAL_TABLET | Freq: Every day | ORAL | Status: DC

## 2012-12-06 MED ORDER — LEVALBUTEROL TARTRATE 45 MCG/ACT IN AERO: 2.0000 | INHALATION_SPRAY | Freq: Two times a day (BID) | RESPIRATORY_TRACT | Status: DC

## 2012-12-06 MED ORDER — LATANOPROST 0.005 % OP SOLN: 1.0000 [drp] | Freq: Every day | OPHTHALMIC | Status: DC

## 2012-12-06 MED ORDER — LORATADINE 10 MG PO TABS: 10.0000 mg | ORAL_TABLET | Freq: Every day | ORAL | Status: DC

## 2012-12-06 MED ORDER — BRIMONIDINE TARTRATE 0.2 % OP SOLN: 1.0000 [drp] | Freq: Three times a day (TID) | OPHTHALMIC | Status: DC

## 2012-12-06 MED ORDER — PREDNISONE 5 MG PO TABS: 5.0000 mg | ORAL_TABLET | Freq: Every day | ORAL | Status: DC

## 2012-12-06 MED ORDER — FLUTICASONE PROPIONATE 50 MCG/ACT NA SUSP: 1.0000 | Freq: Every day | NASAL | Status: DC

## 2012-12-06 NOTE — Patient Instructions (Addendum)
Fall precautions discussed Continue current meds and therapeutic lifestyle changes Try to get your strength back and increase her walking gradually Use all of your allergy medicines regularly this spring and starting again August

## 2012-12-06 NOTE — Addendum Note (Signed)
Addended by: Lisbeth Ply C on: 12/06/2012 10:40 AM   Modules accepted: Orders

## 2012-12-06 NOTE — Progress Notes (Signed)
Subjective:    Patient ID: Stacey Davenport, female    DOB: January 07, 1941, 72 y.o.   MRN: [id number]  HPI This patient presents for recheck of multiple medical problems. No one accompanies the patient today. Patient is still recovering from a fall at her son's home that occurred 3-4 weeks ago. She had hit her head on cement and has multiple bruises that she is recovering from on her arms and her buttocks. She is supposed to see the cardiologist in June. She was supposed to see the allergy specialist this month but is going to postpone that until the fall .  Patient Active Problem List   Diagnosis Date Noted  . Shortness of breath 09/30/2011  . Atrial fibrillation 09/30/2011  . Hypertension 08/20/2009  . GLUCOMA 04/17/2009  . ASTHMA 04/17/2009  . ESOPHAGEAL STRICTURE 04/17/2009  . GERD 04/17/2009  . GASTRITIS 04/17/2009  . Rheumatoid arthritis 04/17/2009    In addition, see review of systems, most of her complaints today revolve around allergies and joint pain.  The allergies, current medications, past medical history, surgical history, family and social history are reviewed.  Immunizations reviewed.  Health maintenance reviewed.  The following items are outstanding: none      Review of Systems  Constitutional: Positive for activity change (decreased since falling) and fatigue.  HENT: Positive for ear pain (occasional due to allergies), rhinorrhea (due to allergies), sneezing (due to allergies), postnasal drip and sinus pressure. Negative for sore throat.   Eyes: Positive for itching (due to allergies).  Respiratory: Positive for shortness of breath (minimal) and wheezing (occasional). Negative for cough.   Cardiovascular: Positive for leg swelling (slight).  Gastrointestinal: Positive for abdominal pain (slight) and constipation.  Genitourinary: Positive for frequency.  Musculoskeletal: Positive for back pain (LBP), arthralgias (knees) and gait problem.  Allergic/Immunologic:  Positive for environmental allergies (seasonal).  Neurological: Positive for dizziness (slight), weakness and headaches (2-3 x week,possibly due to allergies).  Psychiatric/Behavioral: Positive for sleep disturbance (occasional). The patient is nervous/anxious (some anxiety, controlled by meds).        Objective:   Physical Exam BP 133/72  Pulse 64  Temp(Src) 97.2 F (36.2 C) (Oral)  Ht 5\' 6"  (1.676 m)  Wt 199 lb (90.266 kg)  BMI 32.13 kg/m2  The patient appeared well nourished and normally developed, alert and oriented to time and place. Speech, behavior and judgement appear normal. Vital signs as documented.  Head exam is unremarkable. No scleral icterus or pallor noted. Patient has nasal congestion bilaterally.  Neck is without jugular venous distension, thyromegally, or carotid bruits. Carotid upstrokes are brisk bilaterally. No cervical adenopathy. Lungs are clear anteriorly and posteriorly to auscultation. Normal respiratory effort. There was no wheezing. Cardiac exam reveals regular rate and rhythm at 72 per minute. First and second heart sounds normal.  No murmurs, rubs or gallops.  Abdominal exam reveals normal bowl sounds, no masses, no organomegaly and no aortic enlargement. Slight epigastric tenderness. No inguinal adenopathy. Extremities are nonedematous and both femoral and pedal pulses are normal. Skin without pallor or jaundice. There are multiple bruises present that are resolving from a fall from several weeks ago. Specifically occiput and both arms.  Warm and dry, without rash. Neurologic exam reveals normal deep tendon reflexes and normal sensation.          Assessment & Plan:  1. Fatigue - POCT CBC; Standing - Vitamin D 25 hydroxy; Standing - Thyroid Panel With TSH  2. Hypothyroid - Thyroid Panel With TSH  3.  Hyperlipemia - Hepatic function panel; Standing - NMR Lipoprofile with Lipids; Standing  4. Atrial fibrillation - BASIC METABOLIC PANEL WITH  GFR; Standing  5. Vitamin D deficiency - Vitamin D 25 hydroxy; Standing  6. ASTHMA Good control currently with current medication  7. GERD Good control currently with current medication  8. Hypertension  9. 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 Ethelene Hal call and see if he might reinject her back  11. CVA History Stable regaining her strength  Patient Instructions  Fall precautions discussed Continue current meds and therapeutic lifestyle changes Try to get your strength back and increase her walking gradually Use all of your allergy medicines regularly this spring and starting again August

## 2012-12-06 NOTE — Addendum Note (Signed)
Addended by: Lisbeth Ply C on: 12/06/2012 10:39 AM   Modules accepted: Orders

## 2012-12-07 ENCOUNTER — Telehealth: Payer: Self-pay | Admitting: *Deleted

## 2012-12-07 LAB — NMR LIPOPROFILE WITH LIPIDS
HDL Size: 10.2 nm (ref >=9.2)
HDL-C: 88 mg/dL (ref >=40)
LDL (calc): 91 mg/dL (ref <100)
LDL Size: 21.4 nm (ref >20.5)
LP-IR Score: <25 (ref <=45)
Triglycerides: 94 mg/dL (ref <150)
VLDL Size: 46.1 nm (ref <=46.6)

## 2012-12-07 LAB — VITAMIN D 25 HYDROXY (VIT D DEFICIENCY, FRACTURES): Vit D, 25-Hydroxy: 47 ng/mL (ref 30–89)

## 2012-12-07 LAB — POCT INR: INR: 1.67

## 2012-12-07 NOTE — Telephone Encounter (Signed)
Message copied by Bearl Mulberry on Thu Dec 07, 2012  6:12 PM ------      Message from: Ernestina Penna      Created: Thu Dec 07, 2012  5:26 PM       Increase Coumadin to 4 mg every day but Monday Wednesday and Friday and take 2 mg on Monday Wednesday and Friday      Recheck pro time in 7 days+++++++      Call patient ------

## 2012-12-07 NOTE — Addendum Note (Signed)
Addended by: Monica Becton on: 12/07/2012 09:23 AM   Modules accepted: Orders

## 2012-12-07 NOTE — Telephone Encounter (Signed)
Pt notified of lab results and change in Coumadin dosage

## 2012-12-08 NOTE — Telephone Encounter (Signed)
Patient was seen in the er

## 2012-12-11 ENCOUNTER — Other Ambulatory Visit: Payer: Self-pay

## 2012-12-11 MED ORDER — CICLESONIDE 160 MCG/ACT IN AERS: 1.0000 | INHALATION_SPRAY | Freq: Two times a day (BID) | RESPIRATORY_TRACT | Status: DC

## 2012-12-11 MED ORDER — OLOPATADINE HCL 0.2 % OP SOLN: 1.0000 [drp] | Freq: Every day | OPHTHALMIC | Status: DC

## 2012-12-12 ENCOUNTER — Ambulatory Visit: Payer: Self-pay | Admitting: Pharmacist

## 2012-12-19 ENCOUNTER — Ambulatory Visit (INDEPENDENT_AMBULATORY_CARE_PROVIDER_SITE_OTHER): Payer: Medicare Other

## 2012-12-19 DIAGNOSIS — I4891 Unspecified atrial fibrillation: Secondary | ICD-10-CM

## 2012-12-19 LAB — POCT CBC
Granulocyte percent: 80.2 %G — AB (ref 37–80)
HCT, POC: 44.6 % (ref 37.7–47.9)
Hemoglobin: 14.9 g/dL (ref 12.2–16.2)
Lymph, poc: 1.5 (ref 0.6–3.4)
MCH, POC: 31.2 pg (ref 27–31.2)
MCV: 93 fL (ref 80–97)
Platelet Count, POC: 197 10*3/uL (ref 142–424)
RBC: 4.8 M/uL (ref 4.04–5.48)

## 2012-12-19 LAB — POCT INR: INR: 6.7

## 2012-12-21 ENCOUNTER — Ambulatory Visit (INDEPENDENT_AMBULATORY_CARE_PROVIDER_SITE_OTHER): Payer: Medicare Other | Admitting: Pharmacist

## 2012-12-21 DIAGNOSIS — I4891 Unspecified atrial fibrillation: Secondary | ICD-10-CM

## 2012-12-21 NOTE — Progress Notes (Signed)
Patient here for protime recheck  See anticoagulation note

## 2012-12-21 NOTE — Patient Instructions (Signed)
Anticoagulation Dose Instructions as of 12/21/2012     Stacey Davenport Tue Wed Thu Fri Sat   New Dose 2 mg 4 mg 2 mg 2 mg 2 mg 4 mg 2 mg    Description       Restart warfarin at new dose of 1 tablet on mondays and fridays and 1/2 tablet all other days       INR was 2.0 today

## 2012-12-27 ENCOUNTER — Ambulatory Visit (INDEPENDENT_AMBULATORY_CARE_PROVIDER_SITE_OTHER): Payer: Medicare Other | Admitting: Pharmacist

## 2012-12-27 DIAGNOSIS — I4891 Unspecified atrial fibrillation: Secondary | ICD-10-CM

## 2012-12-27 LAB — POCT INR: INR: 1.6

## 2012-12-27 NOTE — Patient Instructions (Signed)
Anticoagulation Dose Instructions as of 12/27/2012     Stacey Davenport Tue Wed Thu Fri Sat   New Dose 2 mg 4 mg 2 mg 4 mg 2 mg 4 mg 2 mg    Description       Start new warfarin dose of 1 tablet on mondays, wednesdays and fridays and 1/2 tablet all other days       INR was 1.6 today

## 2013-01-09 ENCOUNTER — Ambulatory Visit (INDEPENDENT_AMBULATORY_CARE_PROVIDER_SITE_OTHER): Payer: Medicare Other | Admitting: Pharmacist

## 2013-01-09 DIAGNOSIS — I4891 Unspecified atrial fibrillation: Secondary | ICD-10-CM

## 2013-01-09 LAB — POCT INR: INR: 2.3

## 2013-01-09 NOTE — Patient Instructions (Signed)
Anticoagulation Dose Instructions as of 01/09/2013     Stacey Davenport Tue Wed Thu Fri Sat   New Dose 2 mg 4 mg 2 mg 4 mg 2 mg 4 mg 2 mg    Description       Continue current warfarin dose of 1 tablet on mondays, wednesdays and fridays and 1/2 tablet all other days       INR was 2.3 today

## 2013-01-17 ENCOUNTER — Ambulatory Visit: Payer: Medicare Other | Admitting: Cardiology

## 2013-01-22 ENCOUNTER — Ambulatory Visit (INDEPENDENT_AMBULATORY_CARE_PROVIDER_SITE_OTHER): Payer: Medicare Other | Admitting: Family Medicine

## 2013-01-22 VITALS — BP 153/74 | HR 89 | Temp 97.4°F

## 2013-01-22 DIAGNOSIS — J45909 Unspecified asthma, uncomplicated: Secondary | ICD-10-CM

## 2013-01-22 DIAGNOSIS — R0602 Shortness of breath: Secondary | ICD-10-CM

## 2013-01-22 DIAGNOSIS — I4902 Ventricular flutter: Secondary | ICD-10-CM

## 2013-01-22 DIAGNOSIS — I498 Other specified cardiac arrhythmias: Secondary | ICD-10-CM

## 2013-01-22 DIAGNOSIS — I4891 Unspecified atrial fibrillation: Secondary | ICD-10-CM

## 2013-01-22 NOTE — Progress Notes (Addendum)
  Subjective:    Patient ID: Stacey Davenport, female    DOB: 04-Aug-1940, 72 y.o.   MRN: [id number]  HPI Patient presents today with increased fluttering problems with her heart. She has a history of atrial fibrillation and CVA and asthma. She awoke this morning with a fluttering sensation in try to get back to sleep on several occasions but it awoke her again fluttering. She has had asthma under fairly stable control recently. She does use of Vesco and Xopenex. She has been taking her medication regularly. She has shortness of breath as usual and she denies chest pain.   Review of Systems  Constitutional: Negative.   Respiratory: Positive for shortness of breath (as usual).   Cardiovascular: Positive for palpitations (since 4:30 this morning off and on). Negative for chest pain and leg swelling.  Gastrointestinal: Negative.   Genitourinary: Negative.   Musculoskeletal: Negative.   Skin: Negative.   Neurological: Negative.   Psychiatric/Behavioral: Negative for agitation.       Objective:   Physical Exam  Vitals reviewed. Constitutional: She is oriented to person, place, and time. She appears well-developed and well-nourished. No distress.  HENT:  Head: Normocephalic and atraumatic.  Eyes: Conjunctivae are normal. Right eye exhibits no discharge. Left eye exhibits discharge. No scleral icterus.  Neck: Normal range of motion. Neck supple. No thyromegaly present.  Cardiovascular: Normal rate and regular rhythm.  Exam reveals friction rub. Exam reveals no gallop.   No murmur heard. At 72 per minute  Pulmonary/Chest: Effort normal and breath sounds normal. No respiratory distress. She has no wheezes. She has no rales.  Abdominal: Soft. Bowel sounds are normal. She exhibits no distension and no mass. There is tenderness (epigastrium and left upper quadrant). There is no rebound and no guarding.  Musculoskeletal: Normal range of motion. She exhibits no edema.  Neurological: She is alert and  oriented to person, place, and time.  Skin: Skin is warm and dry. No rash noted. She is not diaphoretic.  Psychiatric: She has a normal mood and affect. Her behavior is normal. Judgment and thought content normal.    EKG: Normal sinus rhythm, stable       Assessment & Plan:   1. Fluttering heart - EKG 12-Lead  2. A-fib  3. Shortness of breath  4. Asthma, chronic, unspecified asthma severity, uncomplicated   Patient Instructions  Monitor blood pressure more closely Take an extra half of a 0.5 lorazepam when getting home Try to get more rest today Call us back if problems continue   Results the findings were discussed with her son He and the patient understand to get back in touch with [place] if problems continue

## 2013-01-22 NOTE — Patient Instructions (Addendum)
Monitor blood pressure more closely Take an extra half of a 0.5 lorazepam when getting home Try to get more rest today Call us back if problems continue

## 2013-01-23 ENCOUNTER — Telehealth: Payer: Self-pay | Admitting: *Deleted

## 2013-01-23 NOTE — Telephone Encounter (Signed)
Stacey Davenport (son) called to report that his mom has skin tear but states Dr Christell Constant showed him to dress her skin tears and he has done so and states stitches will not fix it and he denied office visit for this evening as well as tomorrow.

## 2013-02-03 ENCOUNTER — Ambulatory Visit (INDEPENDENT_AMBULATORY_CARE_PROVIDER_SITE_OTHER): Payer: Medicare Other | Admitting: Nurse Practitioner

## 2013-02-03 ENCOUNTER — Encounter: Payer: Self-pay | Admitting: Nurse Practitioner

## 2013-02-03 VITALS — BP 147/67 | HR 67 | Temp 96.6°F | Ht 66.0 in | Wt 198.0 lb

## 2013-02-03 DIAGNOSIS — H6123 Impacted cerumen, bilateral: Secondary | ICD-10-CM

## 2013-02-03 DIAGNOSIS — H9202 Otalgia, left ear: Secondary | ICD-10-CM

## 2013-02-03 DIAGNOSIS — R42 Dizziness and giddiness: Secondary | ICD-10-CM

## 2013-02-03 DIAGNOSIS — H9209 Otalgia, unspecified ear: Secondary | ICD-10-CM

## 2013-02-03 DIAGNOSIS — H6120 Impacted cerumen, unspecified ear: Secondary | ICD-10-CM

## 2013-02-03 MED ORDER — MECLIZINE HCL 25 MG PO TABS: 25.0000 mg | ORAL_TABLET | Freq: Three times a day (TID) | ORAL | Status: DC | PRN

## 2013-02-03 NOTE — Patient Instructions (Signed)
Vertigo Vertigo means you feel like you or your surroundings are moving when they are not. Vertigo can be dangerous if it occurs when you are at work, driving, or performing difficult activities.  CAUSES  Vertigo occurs when there is a conflict of signals sent to your brain from the visual and sensory systems in your body. There are many different causes of vertigo, including:  Infections, especially in the inner ear.  A bad reaction to a drug or misuse of alcohol and medicines.  Withdrawal from drugs or alcohol.  Rapidly changing positions, such as lying down or rolling over in bed.  A migraine headache.  Decreased blood flow to the brain.  Increased pressure in the brain from a head injury, infection, tumor, or bleeding. SYMPTOMS  You may feel as though the world is spinning around or you are falling to the ground. Because your balance is upset, vertigo can cause nausea and vomiting. You may have involuntary eye movements (nystagmus). DIAGNOSIS  Vertigo is usually diagnosed by physical exam. If the cause of your vertigo is unknown, your caregiver may perform imaging tests, such as an MRI scan (magnetic resonance imaging). TREATMENT  Most cases of vertigo resolve on their own, without treatment. Depending on the cause, your caregiver may prescribe certain medicines. If your vertigo is related to body position issues, your caregiver may recommend movements or procedures to correct the problem. In rare cases, if your vertigo is caused by certain inner ear problems, you may need surgery. HOME CARE INSTRUCTIONS   Follow your caregiver's instructions.  Avoid driving.  Avoid operating heavy machinery.  Avoid performing any tasks that would be dangerous to you or others during a vertigo episode.  Tell your caregiver if you notice that certain medicines seem to be causing your vertigo. Some of the medicines used to treat vertigo episodes can actually make them worse in some people. SEEK  IMMEDIATE MEDICAL CARE IF:   Your medicines do not relieve your vertigo or are making it worse.  You develop problems with talking, walking, weakness, or using your arms, hands, or legs.  You develop severe headaches.  Your nausea or vomiting continues or gets worse.  You develop visual changes.  A family member notices behavioral changes.  Your condition gets worse. MAKE SURE YOU:  Understand these instructions.  Will watch your condition.  Will get help right away if you are not doing well or get worse. Document Released: 04/21/2005 Document Revised: 10/04/2011 Document Reviewed: 01/28/2011 Exit[employer] Patient Information 2014 [employer].  

## 2013-02-03 NOTE — Progress Notes (Signed)
  Subjective:    Patient ID: Stacey Davenport, female    DOB: [birth year], 72 y.o.   MRN: [id number]  HPI Patient in C/O left ear pain an ddizziness- started yesterday- No other symptoms. Saw Dr. Christell Constant 2 weeks ago and was told she had to much wax in her ears and sha has been using ear wax drops.    Review of Systems  All other systems reviewed and are negative.       Objective:   Physical Exam  Constitutional: She appears well-developed and well-nourished.  HENT:  Right Ear: Hearing, tympanic membrane and external ear normal. A foreign body (cerumen impaction) is present.  Left Ear: Hearing, tympanic membrane and external ear normal. A foreign body (cerumen impaction) is present.  Nose: Nose normal. Right sinus exhibits no maxillary sinus tenderness and no frontal sinus tenderness. Left sinus exhibits no maxillary sinus tenderness and no frontal sinus tenderness.  Mouth/Throat: Oropharynx is clear and moist.  Cardiovascular: Normal rate, normal heart sounds and intact distal pulses.   Pulmonary/Chest: Effort normal and breath sounds normal.    BP 147/67  Pulse 67  Temp(Src) 96.6 F (35.9 C) (Oral)  Ht 5\' 6"  (1.676 m)  Wt 198 lb (89.812 kg)  BMI 31.97 kg/m2       Assessment & Plan:  1. Cerumen impaction, bilateral bil ear irigation an dcurrettage Continue debrox 2 x per week  2. Otalgia, left Motrin or tylenol OTC  3. Vertigo Meclizine 25 mg 1 po tid prn Force fluids  Mary-Margaret Daphine Deutscher, FNP

## 2013-02-06 ENCOUNTER — Encounter: Payer: Self-pay | Admitting: Nurse Practitioner

## 2013-02-06 ENCOUNTER — Ambulatory Visit (INDEPENDENT_AMBULATORY_CARE_PROVIDER_SITE_OTHER): Payer: Medicare Other | Admitting: Nurse Practitioner

## 2013-02-06 VITALS — BP 118/58 | HR 62 | Temp 98.3°F | Wt 198.0 lb

## 2013-02-06 DIAGNOSIS — S51812A Laceration without foreign body of left forearm, initial encounter: Secondary | ICD-10-CM

## 2013-02-06 DIAGNOSIS — S51809A Unspecified open wound of unspecified forearm, initial encounter: Secondary | ICD-10-CM

## 2013-02-06 MED ORDER — DOXYCYCLINE HYCLATE 100 MG PO TABS: 100.0000 mg | ORAL_TABLET | Freq: Two times a day (BID) | ORAL | Status: DC

## 2013-02-06 NOTE — Progress Notes (Signed)
  Subjective:    Patient ID: Stacey Davenport, female    DOB: 12/27/1940, 72 y.o.   MRN: [id number]  HPI Pt here for a skin tear that occurred one week ago. Pt hit arm against cabinet and pt been dressing wound herself with neosporin and nonstick guaze. Pt states it is healing. Pt denies pain or fevers.      Review of Systems  Skin: Positive for wound (left skin tear).  All other systems reviewed and are negative.       Objective:   Physical Exam  Constitutional: She is oriented to person, place, and time. She appears well-developed and well-nourished.  HENT:  Head: Normocephalic.  Cardiovascular: Normal rate, regular rhythm, normal heart sounds and intact distal pulses.   Pulmonary/Chest: Effort normal and breath sounds normal.  Neurological: She is alert and oriented to person, place, and time.  Skin: Skin is warm and dry. There is erythema.  Skin tear on left arm about 3 in X .5in     BP 118/58  Pulse 62  Temp(Src) 98.3 F (36.8 C) (Oral)  Wt 198 lb (89.812 kg)  BMI 31.97 kg/m2       Assessment & Plan:  1. Skin tear of forearm without complication, left, initial encounter Keep clean and dry Dressing change daily - doxycycline (VIBRA-TABS) 100 MG tablet; Take 1 tablet (100 mg total) by mouth 2 (two) times daily.  Dispense: 20 tablet; Refill: 0   Mary-Margaret Daphine Deutscher, FNP

## 2013-02-06 NOTE — Patient Instructions (Addendum)
Wound Care Wound care helps prevent pain and infection.  You may need a tetanus shot if:  You cannot remember when you had your last tetanus shot.  You have never had a tetanus shot.  The injury broke your skin. If you need a tetanus shot and you choose not to have one, you may get tetanus. Sickness from tetanus can be serious. HOME CARE   Only take medicine as told by your doctor.  Clean the wound daily with mild soap and water.  Change any bandages (dressings) as told by your doctor.  Put medicated cream and a bandage on the wound as told by your doctor.  Change the bandage if it gets wet, dirty, or starts to smell.  Take showers. Do not take baths, swim, or do anything that puts your wound under water.  Rest and raise (elevate) the wound until the pain and puffiness (swelling) are better.  Keep all doctor visits as told. GET HELP RIGHT AWAY IF:   Yellowish-white fluid (pus) comes from the wound.  Medicine does not lessen your pain.  There is a red streak going away from the wound.  You have a fever. MAKE SURE YOU:   Understand these instructions.  Will watch your condition.  Will get help right away if you are not doing well or get worse. Document Released: 04/20/2008 Document Revised: 10/04/2011 Document Reviewed: 11/15/2010 Exit[employer] Patient Information 2014 [employer].  

## 2013-02-15 ENCOUNTER — Ambulatory Visit (INDEPENDENT_AMBULATORY_CARE_PROVIDER_SITE_OTHER): Payer: Medicare Other | Admitting: Pharmacist

## 2013-02-15 DIAGNOSIS — I4891 Unspecified atrial fibrillation: Secondary | ICD-10-CM

## 2013-02-15 NOTE — Patient Instructions (Signed)
Anticoagulation Dose Instructions as of 02/15/2013     Stacey Davenport Tue Wed Thu Fri Sat   New Dose 2 mg 4 mg 2 mg 4 mg 2 mg 4 mg 2 mg    Description       Continue current warfarin dose of 1 tablet on mondays, wednesdays and fridays and 1/2 tablet all other days       INR was 2.2 today

## 2013-02-27 ENCOUNTER — Other Ambulatory Visit: Payer: Self-pay | Admitting: *Deleted

## 2013-02-27 MED ORDER — OLOPATADINE HCL 0.2 % OP SOLN: 1.0000 [drp] | Freq: Every day | OPHTHALMIC | Status: DC

## 2013-02-27 MED ORDER — GUAIFENESIN 200 MG PO TABS: 400.0000 mg | ORAL_TABLET | Freq: Every day | ORAL | Status: DC

## 2013-02-27 NOTE — Telephone Encounter (Signed)
Received fax for rx refill from pharmacy for Guaifenesin. Not on med list that we have. Please advise

## 2013-03-07 ENCOUNTER — Encounter: Payer: Self-pay | Admitting: Cardiology

## 2013-03-07 ENCOUNTER — Ambulatory Visit (INDEPENDENT_AMBULATORY_CARE_PROVIDER_SITE_OTHER): Payer: Medicare Other | Admitting: Cardiology

## 2013-03-07 VITALS — BP 131/69 | HR 66 | Ht 64.0 in | Wt 201.0 lb

## 2013-03-07 DIAGNOSIS — I4891 Unspecified atrial fibrillation: Secondary | ICD-10-CM

## 2013-03-07 MED ORDER — FUROSEMIDE 20 MG PO TABS: 20.0000 mg | ORAL_TABLET | ORAL | Status: DC | PRN

## 2013-03-07 MED ORDER — AMIODARONE HCL 200 MG PO TABS: 200.0000 mg | ORAL_TABLET | Freq: Every day | ORAL | Status: DC

## 2013-03-07 NOTE — Patient Instructions (Addendum)
Your physician wants you to follow-up in: 1 YEAR WITH DR. HOCHREIN. You will receive a reminder letter in the mail two months in advance. If you don't receive a letter, please call our office to schedule the follow-up appointment.  START LASIX 20 MG 1 TABLET DAILY ONLY AS NEEDED FOR SWELLING  A REFILL HAS BEEN SENT IN FOR YOUR AMIODARONE AS WELL

## 2013-03-07 NOTE — Progress Notes (Signed)
HPI The patient presents for followup of her atrial fibrillation. She has had this documented previously. Since I last saw her she has had no new cardiovascular problems. She was having some problems with her eyes but had a thorough ophthalmologic examination with no significant acute abnormalities. She says her vision hasn't got any worse. She was basically complaining of light sensitivity previously. She did have an episode of palpitations and I reviewed these records. She saw Dr. Christell Constant in June but when she thought her heart was fluttering she actually was in normal sinus rhythm. She did have a mechanical fall in April with an ER visit and I reviewed these records. Head CT showed no acute abnormalities. She otherwise has no acute cardiac complaints and is not describing any chest pressure, neck or arm discomfort. She's had no presyncope or syncope though she has some mild increasing lower extremity swelling. She's had no new shortness of breath, PND or orthopnea.   Allergies  Allergen Reactions  . Aciphex [Rabeprazole Sodium]   . Amoxicillin   . Biaxin [Clarithromycin] Hives and Itching  . Cephalexin   . Esomeprazole Magnesium     Takes Dexilant at home  . Hydrocodone   . Hydrocodone-Acetaminophen   . Ketek [Telithromycin]   . Latex   . Levaquin [Levofloxacin In D5w]   . Lipitor [Atorvastatin]   . Macrobid [Nitrofurantoin Macrocrystal]   . Moxifloxacin   . Nexium [Esomeprazole Magnesium]   . Rabeprazole Sodium     Takes Dexilant at home  . Sulfonamide Derivatives   . Vesicare [Solifenacin]   . Zithromax [Azithromycin]     Current Outpatient Prescriptions  Medication Sig Dispense Refill  . acetaminophen (TYLENOL) 325 MG tablet Take 650 mg by mouth every 6 (six) hours as needed for pain.      Marland Kitchen amiodarone (PACERONE) 200 MG tablet Take 1 tablet (200 mg total) by mouth daily.  30 tablet  6  . brimonidine (ALPHAGAN) 0.2 % ophthalmic solution Place 1 drop into both eyes 3 (three)  times daily.  5 mL  11  . calcium-vitamin D (OSCAL) 250-125 MG-UNIT per tablet Take 1 tablet by mouth daily.        . cholecalciferol (VITAMIN D) 1000 UNITS tablet Take 2,000 Units by mouth daily.      . ciclesonide (ALVESCO) 160 MCG/ACT inhaler Inhale 1 puff into the lungs 2 (two) times daily.  1 Inhaler  2  . dexlansoprazole (DEXILANT) 60 MG capsule Take 1 capsule (60 mg total) by mouth daily.  30 capsule  11  . diltiazem (CARDIZEM CD) 180 MG 24 hr capsule Take 1 capsule (180 mg total) by mouth 2 (two) times daily.  60 capsule  5  . fluticasone (FLONASE) 50 MCG/ACT nasal spray Place 1 spray into the nose daily.  16 g  11  . guaiFENesin 200 MG tablet Take 2 tablets (400 mg total) by mouth daily.  30 tablet  0  . latanoprost (XALATAN) 0.005 % ophthalmic solution Place 1 drop into both eyes at bedtime.  2.5 mL  11  . levalbuterol (XOPENEX HFA) 45 MCG/ACT inhaler Inhale 2 puffs into the lungs 2 (two) times daily.  1 Inhaler  11  . levothyroxine (SYNTHROID, LEVOTHROID) 75 MCG tablet Take once daily  30 tablet  8  . loratadine (CLARITIN) 10 MG tablet Take 1 tablet (10 mg total) by mouth daily.  30 tablet  11  . LORazepam (ATIVAN) 0.5 MG tablet Take 1-2 tablets (0.5-1 mg total) by mouth daily  as needed. For anxiety  30 tablet  5  . montelukast (SINGULAIR) 10 MG tablet Take 1 tablet (10 mg total) by mouth daily.  30 tablet  11  . Multiple Vitamins-Minerals (ONE-A-DAY WEIGHT SMART ADVANCE) TABS Take 1 tablet by mouth daily.        . Olopatadine HCl (PATADAY) 0.2 % SOLN Apply 1 drop to eye daily.  2.5 mL  2  . potassium chloride (K-DUR,KLOR-CON) 10 MEQ tablet Take 1 tablet (10 mEq total) by mouth daily.  30 tablet  11  . predniSONE (DELTASONE) 5 MG tablet Take 1 tablet (5 mg total) by mouth daily.  30 tablet  11  . traMADol (ULTRAM) 50 MG tablet Take 1 tablet (50 mg total) by mouth every 6 (six) hours as needed for pain.  15 tablet  0  . warfarin (COUMADIN) 4 MG tablet Take 0.5-1 tablets (2-4 mg total) by  mouth daily. Take 1 tablet Tuesday, Thursday and Saturday then take 1/2 tablet on Monday, Wednesday, Friday and Sunday  30 tablet  11   No current facility-administered medications for this visit.    Past Medical History  Diagnosis Date  . Esophageal stricture   . Gastritis   . GERD (gastroesophageal reflux disease)   . Osteoarthritis   . Glaucoma   . Rheumatoid arthritis(714.0)   . Asthma   . Dysrhythmia     hx of irregular heart beat  . Shortness of breath   . Hypothyroidism   . Hypertension   . Pneumonia   . 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 Atrial fibrillation     Past Surgical History  Procedure Laterality Date  . Tubal ligation    . Appendectomy    . Right eye lid surgery    . Bilateral cataract extraction    . Cholecystectomy      ROS:  As stated in the HPI and negative for all other systems.  PHYSICAL EXAM BP 131/69  Pulse 66  Ht 5\' 4"  (1.626 m)  Wt 201 lb (91.173 kg)  BMI 34.48 kg/m2 GENERAL:  Well appearing NECK:  No jugular venous distention, waveform within normal limits, carotid upstroke brisk and symmetric, no bruits, no thyromegaly LUNGS:  Clear to auscultation bilaterally CHEST:  Unremarkable HEART:  PMI not displaced or sustained,S1 and S2 within normal limits, no S3, no clicks, no rubs, no murmurs, irregular ABD:  Flat, positive bowel sounds normal in frequency in pitch, no bruits, no rebound, no guarding, no midline pulsatile mass, no hepatomegaly, no splenomegaly EXT:  2 plus pulses throughout, no edema, no cyanosis no clubbing   ASSESSMENT AND PLAN  Atrial fibrillation -  She is up-to-date with amiodarone followup. I would repeat her labs, TSH and liver profile, in about 6 months. She'll let me know if she ever wants to switch off of amiodarone to Tikosyn if she has any vision  problems that can be prescribed to the any other ongoing over.  Essential hypertension,  benign -  The blood pressure is at target. No change in medications is indicated. We will continue with therapeutic lifestyle changes (TLC).   Edema - We talked about when necessary diuretic use and I will give her a prescription for Lasix.

## 2013-03-19 ENCOUNTER — Ambulatory Visit (INDEPENDENT_AMBULATORY_CARE_PROVIDER_SITE_OTHER): Payer: Medicare Other | Admitting: Pharmacist

## 2013-03-19 DIAGNOSIS — I4891 Unspecified atrial fibrillation: Secondary | ICD-10-CM

## 2013-03-19 LAB — POCT INR: INR: 1.9

## 2013-03-19 NOTE — Patient Instructions (Addendum)
Anticoagulation Dose Instructions as of 03/19/2013     Stacey Davenport Tue Wed Thu Fri Sat   New Dose 2 mg 4 mg 2 mg 4 mg 2 mg 4 mg 2 mg    Description       Take 1 tablet of warfarin today and tomorrow (8/25 and 8/26)m then restart your usual dose of 1 tablet on mondays, wednesdays and fridays and 1/2 tablet all other days     Try to keep dark green vegetables, cabbage and slaw intake consistant.  INR was 1.9 today

## 2013-04-03 ENCOUNTER — Other Ambulatory Visit: Payer: Self-pay

## 2013-04-03 MED ORDER — GUAIFENESIN 200 MG PO TABS: 400.0000 mg | ORAL_TABLET | ORAL | Status: DC | PRN

## 2013-04-03 NOTE — Telephone Encounter (Signed)
Last seen MMM 02/06/13

## 2013-04-10 ENCOUNTER — Other Ambulatory Visit: Payer: Self-pay

## 2013-04-10 MED ORDER — BUDESONIDE 0.5 MG/2ML IN SUSP: 0.5000 mg | Freq: Two times a day (BID) | RESPIRATORY_TRACT | Status: DC

## 2013-04-10 MED ORDER — ALBUTEROL SULFATE (2.5 MG/3ML) 0.083% IN NEBU: 2.5000 mg | INHALATION_SOLUTION | Freq: Four times a day (QID) | RESPIRATORY_TRACT | Status: DC | PRN

## 2013-04-19 ENCOUNTER — Ambulatory Visit (INDEPENDENT_AMBULATORY_CARE_PROVIDER_SITE_OTHER): Payer: Medicare Other | Admitting: Pharmacist

## 2013-04-19 DIAGNOSIS — I4891 Unspecified atrial fibrillation: Secondary | ICD-10-CM

## 2013-04-19 LAB — POCT INR: INR: 3.2

## 2013-04-19 NOTE — Patient Instructions (Signed)
Anticoagulation Dose Instructions as of 04/19/2013     [name] Tue Wed Thu Fri Sat   New Dose 2 mg 4 mg 2 mg 4 mg 2 mg 4 mg 2 mg    Description       Hold warfarin today, then restart your usual dose of 1 tablet on mondays, wednesdays and fridays and 1/2 tablet all other days       INR was 3.2 today

## 2013-04-23 ENCOUNTER — Encounter: Payer: Self-pay | Admitting: Family Medicine

## 2013-04-23 ENCOUNTER — Ambulatory Visit (INDEPENDENT_AMBULATORY_CARE_PROVIDER_SITE_OTHER): Payer: Medicare Other | Admitting: Family Medicine

## 2013-04-23 VITALS — BP 110/62 | HR 70 | Temp 98.5°F | Ht 64.0 in | Wt 198.0 lb

## 2013-04-23 DIAGNOSIS — E785 Hyperlipidemia, unspecified: Secondary | ICD-10-CM

## 2013-04-23 DIAGNOSIS — J45909 Unspecified asthma, uncomplicated: Secondary | ICD-10-CM

## 2013-04-23 DIAGNOSIS — M069 Rheumatoid arthritis, unspecified: Secondary | ICD-10-CM

## 2013-04-23 DIAGNOSIS — I4891 Unspecified atrial fibrillation: Secondary | ICD-10-CM

## 2013-04-23 DIAGNOSIS — E559 Vitamin D deficiency, unspecified: Secondary | ICD-10-CM

## 2013-04-23 DIAGNOSIS — E039 Hypothyroidism, unspecified: Secondary | ICD-10-CM

## 2013-04-23 DIAGNOSIS — K219 Gastro-esophageal reflux disease without esophagitis: Secondary | ICD-10-CM

## 2013-04-23 DIAGNOSIS — I10 Essential (primary) hypertension: Secondary | ICD-10-CM

## 2013-04-23 NOTE — Patient Instructions (Addendum)
Continue current medications. Continue good therapeutic lifestyle changes.  Fall precautions discussed with patient. Follow up as planned and earlier as needed.  Do not forget the Prevnar shot

## 2013-04-23 NOTE — Progress Notes (Signed)
Subjective:    Patient ID: Stacey Davenport, female    DOB: 1941-02-25, 72 y.o.   MRN: [id number]  HPI Pt here for follow up and managment of chronic medical problems. Patient indicates the biggest problems currently with her back. She is seeing the orthopedist for periodic steroid injections but most currently has been on prednisone by mouth. She does have a visit scheduled with the pulmonologist the end of October. Several items on health maintenance are outstanding. These include her mammogram, and pelvic exam. She is also due to get a colonoscopy but wishes to postpone this until 2015. We will wait to get the Prevnar shot when she reduces the current prednisone [name] that she is taking from the orthopedist.     Patient Active Problem List   Diagnosis Date Noted  . Hyperlipidemia 12/06/2012  . CVA History 12/06/2012  . Atrial fibrillation 09/30/2011  . Hypertension 08/20/2009  . Glaucoma 04/17/2009  . ASTHMA 04/17/2009  . ESOPHAGEAL STRICTURE 04/17/2009  . GERD 04/17/2009  . Rheumatoid arthritis 04/17/2009   Outpatient Encounter Prescriptions as of 04/23/2013  Medication Sig Dispense Refill  . acetaminophen (TYLENOL) 325 MG tablet Take 650 mg by mouth every 6 (six) hours as needed for pain.      Marland Kitchen albuterol (PROVENTIL) (2.5 MG/3ML) 0.083% nebulizer solution Take 3 mLs (2.5 mg total) by nebulization every 6 (six) hours as needed for wheezing.  375 mL  2  . amiodarone (PACERONE) 200 MG tablet Take 1 tablet (200 mg total) by mouth daily.  30 tablet  11  . brimonidine (ALPHAGAN) 0.2 % ophthalmic solution Place 1 drop into both eyes 3 (three) times daily.  5 mL  11  . budesonide (PULMICORT) 0.5 MG/2ML nebulizer solution Take 2 mLs (0.5 mg total) by nebulization 2 (two) times daily.  120 mL  2  . calcium-vitamin D (OSCAL) 250-125 MG-UNIT per tablet Take 1 tablet by mouth daily.        . cholecalciferol (VITAMIN D) 1000 UNITS tablet Take 2,000 Units by mouth daily.      . ciclesonide (ALVESCO)  160 MCG/ACT inhaler Inhale 1 puff into the lungs 2 (two) times daily.  1 Inhaler  2  . dexlansoprazole (DEXILANT) 60 MG capsule Take 1 capsule (60 mg total) by mouth daily.  30 capsule  11  . diltiazem (CARDIZEM CD) 180 MG 24 hr capsule Take 1 capsule (180 mg total) by mouth 2 (two) times daily.  60 capsule  5  . fluticasone (FLONASE) 50 MCG/ACT nasal spray Place 1 spray into the nose daily.  16 g  11  . furosemide (LASIX) 20 MG tablet Take 1 tablet (20 mg total) by mouth as needed.  30 tablet  6  . guaiFENesin (ORGAN-I NR) 200 MG tablet Take 2 tablets (400 mg total) by mouth every 4 (four) hours as needed for congestion.  30 suppository  0  . guaiFENesin 200 MG tablet Take 2 tablets (400 mg total) by mouth daily.  30 tablet  0  . latanoprost (XALATAN) 0.005 % ophthalmic solution Place 1 drop into both eyes at bedtime.  2.5 mL  11  . levalbuterol (XOPENEX HFA) 45 MCG/ACT inhaler Inhale 2 puffs into the lungs 2 (two) times daily.  1 Inhaler  11  . levothyroxine (SYNTHROID, LEVOTHROID) 75 MCG tablet Take once daily  30 tablet  8  . loratadine (CLARITIN) 10 MG tablet Take 1 tablet (10 mg total) by mouth daily.  30 tablet  11  . LORazepam (ATIVAN)  0.5 MG tablet Take 1-2 tablets (0.5-1 mg total) by mouth daily as needed. For anxiety  30 tablet  5  . meclizine (ANTIVERT) 25 MG tablet Take 25 mg by mouth as needed.      . montelukast (SINGULAIR) 10 MG tablet Take 1 tablet (10 mg total) by mouth daily.  30 tablet  11  . Multiple Vitamins-Minerals (ONE-A-DAY WEIGHT SMART ADVANCE) TABS Take 1 tablet by mouth daily.        . Olopatadine HCl (PATADAY) 0.2 % SOLN Apply 1 drop to eye daily.  2.5 mL  2  . potassium chloride (K-DUR,KLOR-CON) 10 MEQ tablet Take 1 tablet (10 mEq total) by mouth daily.  30 tablet  11  . predniSONE (DELTASONE) 5 MG tablet Take 1 tablet (5 mg total) by mouth daily.  30 tablet  11  . traMADol (ULTRAM) 50 MG tablet Take 1 tablet (50 mg total) by mouth every 6 (six) hours as needed for  pain.  15 tablet  0  . warfarin (COUMADIN) 4 MG tablet Take 0.5-1 tablets (2-4 mg total) by mouth daily. Take 1 tablet Tuesday, Thursday and Saturday then take 1/2 tablet on Monday, Wednesday, Friday and Sunday  30 tablet  11   No facility-administered encounter medications on file as of 04/23/2013.       Review of Systems  Constitutional: Negative.   HENT: Negative.        Seasonal allergies  Eyes: Negative.   Respiratory: Negative.   Cardiovascular: Negative.   Gastrointestinal: Positive for abdominal pain.  Endocrine: Negative.   Genitourinary: Negative.   Musculoskeletal: Positive for arthralgias.  Skin: Positive for color change (place on nose).  Allergic/Immunologic: Negative.   Neurological: Negative.   Hematological: Negative.   Psychiatric/Behavioral: Negative.        Objective:   Physical Exam  Nursing note and vitals reviewed. Constitutional: She is oriented to person, place, and time. She appears well-developed and well-nourished.  HENT:  Head: Normocephalic and atraumatic.  Right Ear: External ear normal.  Left Ear: External ear normal.  Nose: Nose normal.  Mouth/Throat: Oropharynx is clear and moist.  Eyes: Conjunctivae and EOM are normal. Pupils are equal, round, and reactive to light. Right eye exhibits no discharge. Left eye exhibits no discharge. No scleral icterus.  Neck: Normal range of motion. Neck supple. No JVD present. No thyromegaly present.  Cardiovascular: Normal rate, regular rhythm, normal heart sounds and intact distal pulses.  Exam reveals no gallop and no friction rub.   No murmur heard. At 72 per minute. No atrial fib on auscultation today  Pulmonary/Chest: Effort normal and breath sounds normal. No respiratory distress. She has no wheezes. She has no rales. She exhibits no tenderness.  Abdominal: Soft. Bowel sounds are normal. She exhibits no mass. There is no tenderness. There is no rebound and no guarding.  Musculoskeletal: Normal range  of motion. She exhibits no edema and no tenderness.  Moves slowly due to stiffness and pain in back Good strength in upper and lower extremity  Lymphadenopathy:    She has no cervical adenopathy.  Neurological: She is alert and oriented to person, place, and time. She has normal reflexes. No cranial nerve deficit.  Skin: Skin is warm and dry.  Cryotherapy to pinhead actinic keratosis left bridge of nose and patient's permission while laying supine on the table  Psychiatric: She has a normal mood and affect. Her behavior is normal. Judgment and thought content normal.   BP 110/62  Pulse 70  Temp(Src) 98.5 F (36.9 C) (Oral)  Ht 5\' 4"  (1.626 m)  Wt 198 lb (89.812 kg)  BMI 33.97 kg/m2        Assessment & Plan:    1. Hypertension   2. Rheumatoid arthritis   3. GERD   4. ASTHMA   5. Atrial fibrillation   6. Hyperlipidemia   7. Hypothyroidism   8. Vitamin D deficiency    Orders Placed This Encounter  Procedures  . DG Bone Density    Standing Status: Future     Number of Occurrences:      Standing Expiration Date: 06/23/2014    Order Specific Question:  Reason for Exam (SYMPTOM  OR DIAGNOSIS REQUIRED)    Answer:  post menapausal    Order Specific Question:  Preferred imaging location?    Answer:  Internal  . BMP8+EGFR    Standing Status: Future     Number of Occurrences:      Standing Expiration Date: 04/23/2014  . Hepatic function panel    Standing Status: Future     Number of Occurrences:      Standing Expiration Date: 04/23/2014  . NMR, lipoprofile    Standing Status: Future     Number of Occurrences:      Standing Expiration Date: 04/23/2014  . Vit D  25 hydroxy (rtn osteoporosis monitoring)    Standing Status: Future     Number of Occurrences:      Standing Expiration Date: 04/23/2014  . POCT CBC    Standing Status: Future     Number of Occurrences:      Standing Expiration Date: 04/23/2014   No orders of the defined types were placed in this encounter.    Patient Instructions  Continue current medications. Continue good therapeutic lifestyle changes.  Fall precautions discussed with patient. Follow up as planned and earlier as needed.  Do not forget the Prevnar shot     Nyra Capes MD

## 2013-05-07 ENCOUNTER — Ambulatory Visit: Payer: Medicare Other

## 2013-05-23 ENCOUNTER — Encounter (INDEPENDENT_AMBULATORY_CARE_PROVIDER_SITE_OTHER): Payer: Self-pay

## 2013-05-23 ENCOUNTER — Ambulatory Visit (INDEPENDENT_AMBULATORY_CARE_PROVIDER_SITE_OTHER): Payer: Medicare Other | Admitting: Pharmacist

## 2013-05-23 ENCOUNTER — Ambulatory Visit (INDEPENDENT_AMBULATORY_CARE_PROVIDER_SITE_OTHER): Payer: Medicare Other

## 2013-05-23 VITALS — Ht 64.0 in | Wt 198.0 lb

## 2013-05-23 DIAGNOSIS — M069 Rheumatoid arthritis, unspecified: Secondary | ICD-10-CM

## 2013-05-23 DIAGNOSIS — Z23 Encounter for immunization: Secondary | ICD-10-CM

## 2013-05-23 DIAGNOSIS — E559 Vitamin D deficiency, unspecified: Secondary | ICD-10-CM

## 2013-05-23 DIAGNOSIS — M810 Age-related osteoporosis without current pathological fracture: Secondary | ICD-10-CM | POA: Insufficient documentation

## 2013-05-23 DIAGNOSIS — I4891 Unspecified atrial fibrillation: Secondary | ICD-10-CM

## 2013-05-23 DIAGNOSIS — M8580 Other specified disorders of bone density and structure, unspecified site: Secondary | ICD-10-CM | POA: Insufficient documentation

## 2013-05-23 MED ORDER — RISEDRONATE SODIUM 35 MG PO TBEC: 35.0000 mg | DELAYED_RELEASE_TABLET | ORAL | Status: DC

## 2013-05-23 NOTE — Patient Instructions (Addendum)
Fall Prevention and Home Safety Falls cause injuries and can affect all age groups. It is possible to use preventive measures to significantly decrease the likelihood of falls. There are many simple measures which can make your home safer and prevent falls. OUTDOORS  Repair cracks and edges of walkways and driveways.  Remove high doorway thresholds.  Trim shrubbery on the main path into your home.  Have good outside lighting.  Clear walkways of tools, rocks, debris, and clutter.  Check that handrails are not broken and are securely fastened. Both sides of steps should have handrails.  Have leaves, snow, and ice cleared regularly.  Use sand or salt on walkways during winter months.  In the garage, clean up grease or oil spills. BATHROOM  Install night lights.  Install grab bars by the toilet and in the tub and shower.  Use non-skid mats or decals in the tub or shower.  Place a plastic non-slip stool in the shower to sit on, if needed.  Keep floors dry and clean up all water on the floor immediately.  Remove soap buildup in the tub or shower on a regular basis.  Secure bath mats with non-slip, double-sided rug tape.  Remove throw rugs and tripping hazards from the floors. BEDROOMS  Install night lights.  Make sure a bedside light is easy to reach.  Do not use oversized bedding.  Keep a telephone by your bedside.  Have a firm chair with side arms to use for getting dressed.  Remove throw rugs and tripping hazards from the floor. KITCHEN  Keep handles on pots and pans turned toward the center of the stove. Use back burners when possible.  Clean up spills quickly and allow time for drying.  Avoid walking on wet floors.  Avoid hot utensils and knives.  Position shelves so they are not too high or low.  Place commonly used objects within easy reach.  If necessary, use a sturdy step stool with a grab bar when reaching.  Keep electrical cables out of the  way.  Do not use floor polish or wax that makes floors slippery. If you must use wax, use non-skid floor wax.  Remove throw rugs and tripping hazards from the floor. STAIRWAYS  Never leave objects on stairs.  Place handrails on both sides of stairways and use them. Fix any loose handrails. Make sure handrails on both sides of the stairways are as long as the stairs.  Check carpeting to make sure it is firmly attached along stairs. Make repairs to worn or loose carpet promptly.  Avoid placing throw rugs at the top or bottom of stairways, or properly secure the rug with carpet tape to prevent slippage. Get rid of throw rugs, if possible.  Have an electrician put in a light switch at the top and bottom of the stairs. OTHER FALL PREVENTION TIPS  Wear low-heel or rubber-soled shoes that are supportive and fit well. Wear closed toe shoes.  When using a stepladder, make sure it is fully opened and both spreaders are firmly locked. Do not climb a closed stepladder.  Add color or contrast paint or tape to grab bars and handrails in your home. Place contrasting color strips on first and last steps.  Learn and use mobility aids as needed. Install an electrical emergency response system.  Turn on lights to avoid dark areas. Replace light bulbs that burn out immediately. Get light switches that glow.  Arrange furniture to create clear pathways. Keep furniture in the same place.    Firmly attach carpet with non-skid or double-sided tape.  Eliminate uneven floor surfaces.  Select a carpet pattern that does not visually hide the edge of steps.  Be aware of all pets. OTHER HOME SAFETY TIPS  Set the water temperature for 120 F (48.8 C).  Keep emergency numbers on or near the telephone.  Keep smoke detectors on every level of the home and near sleeping areas. Document Released: 07/02/2002 Document Revised: 01/11/2012 Document Reviewed: 10/01/2011 [facility] Patient Information 2014  [place], [place].   Anticoagulation Dose Instructions as of 05/23/2013     [name] Tue Wed Thu Fri Sat   New Dose 2 mg 4 mg 2 mg 4 mg 2 mg 4 mg 2 mg    Description       Continue your usual dose of 1 tablet on mondays, wednesdays and fridays and 1/2 tablet all other days     INR was 2.7 today   Hold warfarin 5 days prior to spinal injeciton

## 2013-05-23 NOTE — Progress Notes (Signed)
Patient ID: Stacey Davenport, female   DOB: 1940/10/08, 72 y.o.   MRN: [id number]  [facility] Current Height: Height: 5\' 4"  (162.6 cm)      Max Lifetime Height:  5' 8.5" Current Weight: Weight: 198 lb (89.812 kg)       Ethnicity:Caucasian    HPI: Does pt already have a diagnosis of:  Osteopenia?  Yes Osteoporosis?  No  Back Pain?  Yes       Kyphosis?  Yes Prior fracture?  No Med(s) for Osteoporosis/Osteopenia:  none Med(s) previously tried for Osteoporosis/Osteopenia:  None that patient can remember                                                             PMH: Age at menopause:  72 yo Hysterectomy?  No Oophorectomy?  No HRT? No Steroid Use?  Yes - inhaled steriod Thyroid med?  Yes History of cancer?  No History of digestive disorders (ie Crohn's)?  Yes - GERD taking PPI Current or previous eating disorders?  No Last Vitamin D Result:  47 (12/06/2012) Last GFR Result:  81 (12/06/2012)   FH/SH: Family history of osteoporosis?  No Parent with history of hip fracture?  No Family history of breast cancer?  No Exercise?  No Smoking?  No Alcohol?  No    Calcium Assessment Calcium Intake  # of servings/day  Calcium mg  Milk (8 oz) 1  x  300  = 300mg   Yogurt (4 oz) 1 x  200 = 200mg   Cheese (1 oz) 0 x  200 = 0  Other Calcium sources   250mg   Ca supplement 600mg  bid = 1200mg    Estimated calcium intake per day 1950mg     DEXA Results Date of Test T-Score for AP Spine L1-L4 T-Score for Total Left Hip T-Score for Total Right Hip  05/23/2013 -0.8 -1.6 -2.4  04/03/2008 -0.6 -1.1 -2.1  03/22/2005 0.1 -0.7 --  11/12/2002 0.9 -0.5    T-Score for neck of right hip was -2.5 today   Assessment: Osteoporosis - decreased BMD and new T-Score of -2.5 at right hip. Therapeutic anticoagulation today INR was 2.2  Recommendations: 1.  Start  Atelvia 35mg  1 tablets by mouth weekly 2.  recommend calcium 1200mg  daily through supplementation or diet.  3.  recommend weight  bearing exercise - 30 minutes at least 4 days  per week.   4.  Counseled and educated about fall risk and prevention. 5.  Anticoagulation Dose Instructions as of 05/23/2013     Stacey Davenport Tue Wed Thu Fri Sat   New Dose 2 mg 4 mg 2 mg 4 mg 2 mg 4 mg 2 mg    Description       Continue your usual dose of 1 tablet on mondays, wednesdays and fridays and 1/2 tablet all other days       Recheck DEXA:  2 years  Time spent counseling patient:  30 minutes

## 2013-05-25 ENCOUNTER — Other Ambulatory Visit: Payer: Self-pay | Admitting: *Deleted

## 2013-05-25 MED ORDER — LORAZEPAM 0.5 MG PO TABS: 0.5000 mg | ORAL_TABLET | Freq: Every day | ORAL | Status: DC | PRN

## 2013-05-25 MED ORDER — OLOPATADINE HCL 0.2 % OP SOLN: 1.0000 [drp] | Freq: Every day | OPHTHALMIC | Status: DC

## 2013-05-25 NOTE — Telephone Encounter (Signed)
Last filled 04/27/13, last seen 04/23/13. Rout to pool A when approved so [facility]

## 2013-05-25 NOTE — Telephone Encounter (Signed)
As per previous note except  do generic on eyedrops

## 2013-05-25 NOTE — Telephone Encounter (Signed)
The lorazepam is okay for 6 months and the pataday as needed for a year

## 2013-05-28 ENCOUNTER — Telehealth: Payer: Self-pay | Admitting: *Deleted

## 2013-05-28 NOTE — Telephone Encounter (Signed)
Med called to pharm 

## 2013-05-29 ENCOUNTER — Ambulatory Visit (INDEPENDENT_AMBULATORY_CARE_PROVIDER_SITE_OTHER): Payer: Medicare Other | Admitting: Pharmacist Clinician (PhC)/ Clinical Pharmacy Specialist

## 2013-05-29 DIAGNOSIS — I4891 Unspecified atrial fibrillation: Secondary | ICD-10-CM

## 2013-06-25 ENCOUNTER — Other Ambulatory Visit: Payer: Self-pay

## 2013-06-25 MED ORDER — OLOPATADINE HCL 0.2 % OP SOLN: 1.0000 [drp] | Freq: Every day | OPHTHALMIC | Status: DC

## 2013-06-25 MED ORDER — RISEDRONATE SODIUM 35 MG PO TBEC: 1.0000 | DELAYED_RELEASE_TABLET | ORAL | Status: DC

## 2013-06-25 MED ORDER — BUDESONIDE 0.5 MG/2ML IN SUSP: 0.5000 mg | Freq: Two times a day (BID) | RESPIRATORY_TRACT | Status: DC

## 2013-06-25 MED ORDER — ALBUTEROL SULFATE (2.5 MG/3ML) 0.083% IN NEBU: 2.5000 mg | INHALATION_SOLUTION | Freq: Four times a day (QID) | RESPIRATORY_TRACT | Status: DC | PRN

## 2013-07-03 ENCOUNTER — Ambulatory Visit (INDEPENDENT_AMBULATORY_CARE_PROVIDER_SITE_OTHER): Payer: Medicare Other | Admitting: Pharmacist Clinician (PhC)/ Clinical Pharmacy Specialist

## 2013-07-03 DIAGNOSIS — I4891 Unspecified atrial fibrillation: Secondary | ICD-10-CM

## 2013-07-03 LAB — POCT INR: INR: 2.1

## 2013-07-17 ENCOUNTER — Other Ambulatory Visit: Payer: Self-pay

## 2013-07-17 MED ORDER — DILTIAZEM HCL ER COATED BEADS 180 MG PO CP24: 180.0000 mg | ORAL_CAPSULE | Freq: Two times a day (BID) | ORAL | Status: DC

## 2013-08-01 ENCOUNTER — Ambulatory Visit: Payer: Medicare Other | Admitting: Family Medicine

## 2013-08-14 ENCOUNTER — Ambulatory Visit (INDEPENDENT_AMBULATORY_CARE_PROVIDER_SITE_OTHER): Payer: Medicare Other | Admitting: Pharmacist Clinician (PhC)/ Clinical Pharmacy Specialist

## 2013-08-14 DIAGNOSIS — I4891 Unspecified atrial fibrillation: Secondary | ICD-10-CM

## 2013-08-14 LAB — POCT INR: INR: 2.8

## 2013-08-16 ENCOUNTER — Other Ambulatory Visit: Payer: Self-pay | Admitting: *Deleted

## 2013-08-16 MED ORDER — GUAIFENESIN 200 MG PO TABS: ORAL_TABLET | ORAL | Status: DC

## 2013-08-16 MED ORDER — DILTIAZEM HCL ER COATED BEADS 180 MG PO CP24: 180.0000 mg | ORAL_CAPSULE | Freq: Two times a day (BID) | ORAL | Status: DC

## 2013-09-03 ENCOUNTER — Ambulatory Visit (INDEPENDENT_AMBULATORY_CARE_PROVIDER_SITE_OTHER): Payer: Medicare Other | Admitting: Family Medicine

## 2013-09-03 ENCOUNTER — Encounter: Payer: Self-pay | Admitting: Family Medicine

## 2013-09-03 VITALS — BP 125/72 | HR 65 | Temp 97.3°F | Ht 64.0 in | Wt 194.0 lb

## 2013-09-03 DIAGNOSIS — J069 Acute upper respiratory infection, unspecified: Secondary | ICD-10-CM

## 2013-09-03 DIAGNOSIS — Z889 Allergy status to unspecified drugs, medicaments and biological substances status: Secondary | ICD-10-CM | POA: Insufficient documentation

## 2013-09-03 DIAGNOSIS — I10 Essential (primary) hypertension: Secondary | ICD-10-CM

## 2013-09-03 DIAGNOSIS — L84 Corns and callosities: Secondary | ICD-10-CM

## 2013-09-03 DIAGNOSIS — Z79899 Other long term (current) drug therapy: Secondary | ICD-10-CM

## 2013-09-03 DIAGNOSIS — R7989 Other specified abnormal findings of blood chemistry: Secondary | ICD-10-CM

## 2013-09-03 DIAGNOSIS — E039 Hypothyroidism, unspecified: Secondary | ICD-10-CM

## 2013-09-03 DIAGNOSIS — E559 Vitamin D deficiency, unspecified: Secondary | ICD-10-CM

## 2013-09-03 DIAGNOSIS — K219 Gastro-esophageal reflux disease without esophagitis: Secondary | ICD-10-CM

## 2013-09-03 DIAGNOSIS — I4891 Unspecified atrial fibrillation: Secondary | ICD-10-CM

## 2013-09-03 DIAGNOSIS — E785 Hyperlipidemia, unspecified: Secondary | ICD-10-CM

## 2013-09-03 DIAGNOSIS — Z1212 Encounter for screening for malignant neoplasm of rectum: Secondary | ICD-10-CM

## 2013-09-03 MED ORDER — DOXYCYCLINE HYCLATE 100 MG PO TABS: ORAL_TABLET | ORAL | Status: DC

## 2013-09-03 NOTE — Addendum Note (Signed)
Addended by: Earlene Plater on: 09/03/2013 03:54 PM   Modules accepted: Orders

## 2013-09-03 NOTE — Addendum Note (Signed)
Addended by: Earlene Plater on: 09/03/2013 03:55 PM   Modules accepted: Orders

## 2013-09-03 NOTE — Patient Instructions (Addendum)
Continue current medications. Continue good therapeutic lifestyle changes which include good diet and exercise. Fall precautions discussed with patient. Schedule your flu vaccine if you haven't had it yet If you are over 74 years old - you may need Prevnar 22 or the adult Pneumonia vaccine. Use guaifenesin regularly for cough Use saline nose spray regularly keep the house cool Use a cool mist humidifier Take medication as directed Make an appointment with a podiatrist for The callus formation on the third toe of the right foot.

## 2013-09-03 NOTE — Progress Notes (Signed)
Subjective:    Patient ID: Stacey Davenport, female    DOB: 1941/07/22, 73 y.o.   MRN: [id number]  HPI Pt here for follow up and management of chronic medical problems. She complains today of increased sinus congestion and pressure. She did return her FOBT today. On health maintenance issues she is scheduled for lab work, a mammogram, and we'll get a Prevnar vaccine when she gets her pelvic exam. The patient has a history of a CVA but has done really well with this and uses a walker at home. She does exercises on her on at home. Of the upper respiratory symptoms that she is having she has had some low-grade fever and yellow drainage and some yellow sputum.        Patient Active Problem List   Diagnosis Date Noted  . 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 Hypothyroidism 04/23/2013  . Hyperlipidemia 12/06/2012  . CVA History 12/06/2012  . Atrial fibrillation 09/30/2011  . Hypertension 08/20/2009  . Glaucoma 04/17/2009  . ASTHMA 04/17/2009  . ESOPHAGEAL STRICTURE 04/17/2009  . GERD 04/17/2009  . Rheumatoid arthritis 04/17/2009   Outpatient Encounter Prescriptions as of 09/03/2013  Medication Sig  . acetaminophen (TYLENOL) 325 MG tablet Take 650 mg by mouth every 6 (six) hours as needed for pain.  Marland Kitchen albuterol (PROVENTIL) (2.5 MG/3ML) 0.083% nebulizer solution Take 3 mLs (2.5 mg total) by nebulization every 6 (six) hours as needed for wheezing.  Marland Kitchen amiodarone (PACERONE) 200 MG tablet Take 1 tablet (200 mg total) by mouth daily.  . brimonidine (ALPHAGAN) 0.2 % ophthalmic solution Place 1 drop into both eyes 3 (three) times daily.  . budesonide (PULMICORT) 0.5 MG/2ML nebulizer solution Take 2 mLs (0.5 mg total) by nebulization 2 (two) times daily.  . calcium-vitamin D (OSCAL) 250-125 MG-UNIT per tablet Take 1 tablet by mouth daily.    . cholecalciferol (VITAMIN D) 1000 UNITS tablet Take 2,000 Units by mouth daily.  . ciclesonide (ALVESCO) 160 MCG/ACT inhaler Inhale 1 puff into the lungs  2 (two) times daily.  Marland Kitchen dexlansoprazole (DEXILANT) 60 MG capsule Take 1 capsule (60 mg total) by mouth daily.  Marland Kitchen diltiazem (CARDIZEM CD) 180 MG 24 hr capsule Take 1 capsule (180 mg total) by mouth 2 (two) times daily.  . fluticasone (FLONASE) 50 MCG/ACT nasal spray Place 1 spray into the nose daily.  . furosemide (LASIX) 20 MG tablet Take 1 tablet (20 mg total) by mouth as needed.  Marland Kitchen guaiFENesin (ORGAN-I NR) 200 MG tablet Take 2 tablets every 4 hours as needed for congestion  . latanoprost (XALATAN) 0.005 % ophthalmic solution Place 1 drop into both eyes at bedtime.  . levalbuterol (XOPENEX HFA) 45 MCG/ACT inhaler Inhale 2 puffs into the lungs 2 (two) times daily.  Marland Kitchen levothyroxine (SYNTHROID, LEVOTHROID) 75 MCG tablet Take 75 mcg by mouth daily before breakfast. Take once daily  . loratadine (CLARITIN) 10 MG tablet Take 1 tablet (10 mg total) by mouth daily.  Marland Kitchen LORazepam (ATIVAN) 0.5 MG tablet Take 1-2 tablets (0.5-1 mg total) by mouth daily as needed. For anxiety  . meclizine (ANTIVERT) 25 MG tablet Take 25 mg by mouth as needed.  . montelukast (SINGULAIR) 10 MG tablet Take 1 tablet (10 mg total) by mouth daily.  . Olopatadine HCl (PATADAY) 0.2 % SOLN Apply 1 drop to eye daily.  . potassium chloride (K-DUR,KLOR-CON) 10 MEQ tablet Take 1 tablet (10 mEq total) by mouth daily.  . predniSONE (DELTASONE) 5 MG tablet Take 1 tablet (5 mg total)  by mouth daily.  . traMADol (ULTRAM) 50 MG tablet Take 1 tablet (50 mg total) by mouth every 6 (six) hours as needed for pain.  Marland Kitchen warfarin (COUMADIN) 4 MG tablet Take 0.5-1 tablets (2-4 mg total) by mouth daily. Take 1 tablet Tuesday, Thursday and Saturday then take 1/2 tablet on Monday, Wednesday, Friday and Sunday  . [DISCONTINUED] Risedronate Sodium (ATELVIA) 35 MG TBEC Take 1 tablet (35 mg total) by mouth once a week.  . [DISCONTINUED] Risedronate Sodium 35 MG TBEC Take 1 tablet (35 mg total) by mouth once a week.    Review of Systems  Constitutional:  Negative.   HENT: Positive for postnasal drip and sinus pressure.   Eyes: Negative.   Respiratory: Negative.   Cardiovascular: Negative.   Gastrointestinal: Negative.   Endocrine: Negative.   Genitourinary: Negative.   Musculoskeletal: Negative.   Skin: Negative.   Allergic/Immunologic: Negative.   Neurological: Negative.   Hematological: Negative.   Psychiatric/Behavioral: Negative.        Objective:   Physical Exam  Nursing note and vitals reviewed. Constitutional: She is oriented to person, place, and time. She appears well-developed and well-nourished. No distress.  Patient comes to visit today with her walker. She was able to get up on the table with assistance.  HENT:  Head: Normocephalic.  Right Ear: External ear normal.  Left Ear: External ear normal.  Mouth/Throat: Oropharynx is clear and moist.  Nasal congestion and turbinate swelling bilaterally  Eyes: Conjunctivae and EOM are normal. Pupils are equal, round, and reactive to light. Right eye exhibits no discharge. Left eye exhibits no discharge. No scleral icterus.  Neck: Normal range of motion. Neck supple. No thyromegaly present.  Cardiovascular: Normal rate, regular rhythm, normal heart sounds and intact distal pulses.  Exam reveals no gallop and no friction rub.   No murmur heard. The rhythm was regular today at 72 per minute  Pulmonary/Chest: Effort normal and breath sounds normal. No respiratory distress. She has no wheezes. She has no rales. She exhibits no tenderness.   There was no wheezing or chest congestion with coughing.  Abdominal: Soft. Bowel sounds are normal. She exhibits no mass. There is no tenderness. There is no rebound and no guarding.  Musculoskeletal: Normal range of motion. She exhibits no edema and no tenderness.  Patient uses a walker for gait stability since her CVA. The third toe of the right foot has a large callus on the toe pad.  Lymphadenopathy:    She has no cervical adenopathy.    Neurological: She is alert and oriented to person, place, and time. She has normal reflexes. No cranial nerve deficit.  Skin: Skin is warm and dry.  The callus on the third toe as noted above. There was no redness or drainage or sign of infection.  Psychiatric: She has a normal mood and affect. Her behavior is normal. Judgment and thought content normal.   BP 125/72  Pulse 65  Temp(Src) 97.3 F (36.3 C) (Oral)  Ht _0  (1.626 m)  Wt 194 lb (87.998 kg)  BMI 33.28 kg/m2        Assessment & Plan:  1. Atrial fibrillation  2. GERD - POCT CBC; Future  3. Hyperlipidemia - POCT CBC; Future - NMR, lipoprofile; Future  4. Hypertension - POCT CBC; Future - BMP8+EGFR; Future - Hepatic function panel; Future  5. Hypothyroidism  6. High risk medication use  7. Multiple drug allergies  8. URI (upper respiratory infection) - doxycycline (VIBRA-TABS) 100 MG  tablet; 1 twice daily on the first full day with food then one daily for infection until complete  Dispense: 11 tablet; Refill: 0  9. Vitamin D deficiency - Vit D  25 hydroxy (rtn osteoporosis monitoring); Future  10. Callus of foot, third toe pad of right foot Patient Instructions  Continue current medications. Continue good therapeutic lifestyle changes which include good diet and exercise. Fall precautions discussed with patient. Schedule your flu vaccine if you haven't had it yet If you are over 66 years old - you may need Prevnar 11 or the adult Pneumonia vaccine. Use guaifenesin regularly for cough Use saline nose spray regularly keep the house cool Use a cool mist humidifier Take medication as directed Make an appointment with a podiatrist for The callus formation on the third toe of the right foot.    Arrie Senate MD

## 2013-09-05 LAB — FECAL OCCULT BLOOD, IMMUNOCHEMICAL: FECAL OCCULT BLD: POSITIVE — AB

## 2013-09-05 LAB — SPECIMEN STATUS REPORT

## 2013-09-13 ENCOUNTER — Other Ambulatory Visit: Payer: Medicare Other

## 2013-09-18 ENCOUNTER — Other Ambulatory Visit: Payer: Medicare Other

## 2013-09-19 ENCOUNTER — Other Ambulatory Visit: Payer: Self-pay | Admitting: *Deleted

## 2013-09-19 MED ORDER — DILTIAZEM HCL ER COATED BEADS 180 MG PO CP24: 180.0000 mg | ORAL_CAPSULE | Freq: Two times a day (BID) | ORAL | Status: DC

## 2013-09-19 MED ORDER — BUDESONIDE 0.5 MG/2ML IN SUSP: 0.5000 mg | Freq: Two times a day (BID) | RESPIRATORY_TRACT | Status: DC

## 2013-09-19 MED ORDER — ALBUTEROL SULFATE (2.5 MG/3ML) 0.083% IN NEBU: 2.5000 mg | INHALATION_SOLUTION | Freq: Four times a day (QID) | RESPIRATORY_TRACT | Status: DC | PRN

## 2013-09-25 ENCOUNTER — Telehealth: Payer: Self-pay | Admitting: Family Medicine

## 2013-09-25 ENCOUNTER — Emergency Department (HOSPITAL_COMMUNITY)
Admission: EM | Admit: 2013-09-25 | Discharge: 2013-09-25 | Disposition: A | Discharge status: Home / Self Care | Payer: Medicare Other | Attending: Emergency Medicine | Admitting: Emergency Medicine

## 2013-09-25 ENCOUNTER — Other Ambulatory Visit: Payer: Medicare Other

## 2013-09-25 DIAGNOSIS — H409 Unspecified glaucoma: Secondary | ICD-10-CM | POA: Insufficient documentation

## 2013-09-25 DIAGNOSIS — Z9889 Other specified postprocedural states: Secondary | ICD-10-CM | POA: Insufficient documentation

## 2013-09-25 DIAGNOSIS — Y929 Unspecified place or not applicable: Secondary | ICD-10-CM | POA: Insufficient documentation

## 2013-09-25 DIAGNOSIS — J45909 Unspecified asthma, uncomplicated: Secondary | ICD-10-CM | POA: Insufficient documentation

## 2013-09-25 DIAGNOSIS — Z8701 Personal history of pneumonia (recurrent): Secondary | ICD-10-CM | POA: Insufficient documentation

## 2013-09-25 DIAGNOSIS — R42 Dizziness and giddiness: Secondary | ICD-10-CM | POA: Insufficient documentation

## 2013-09-25 DIAGNOSIS — R296 Repeated falls: Secondary | ICD-10-CM | POA: Insufficient documentation

## 2013-09-25 DIAGNOSIS — Z9089 Acquired absence of other organs: Secondary | ICD-10-CM | POA: Insufficient documentation

## 2013-09-25 DIAGNOSIS — I10 Essential (primary) hypertension: Secondary | ICD-10-CM | POA: Insufficient documentation

## 2013-09-25 DIAGNOSIS — M1990 Unspecified osteoarthritis, unspecified site: Secondary | ICD-10-CM | POA: Insufficient documentation

## 2013-09-25 DIAGNOSIS — M069 Rheumatoid arthritis, unspecified: Secondary | ICD-10-CM | POA: Insufficient documentation

## 2013-09-25 DIAGNOSIS — Y9301 Activity, walking, marching and hiking: Secondary | ICD-10-CM | POA: Insufficient documentation

## 2013-09-25 DIAGNOSIS — K219 Gastro-esophageal reflux disease without esophagitis: Secondary | ICD-10-CM | POA: Insufficient documentation

## 2013-09-25 DIAGNOSIS — Z91040 Latex allergy status: Secondary | ICD-10-CM | POA: Insufficient documentation

## 2013-09-25 DIAGNOSIS — S3981XA Other specified injuries of abdomen, initial encounter: Secondary | ICD-10-CM | POA: Insufficient documentation

## 2013-09-25 DIAGNOSIS — S81009A Unspecified open wound, unspecified knee, initial encounter: Secondary | ICD-10-CM | POA: Insufficient documentation

## 2013-09-25 DIAGNOSIS — S91009A Unspecified open wound, unspecified ankle, initial encounter: Secondary | ICD-10-CM

## 2013-09-25 DIAGNOSIS — S51809A Unspecified open wound of unspecified forearm, initial encounter: Secondary | ICD-10-CM | POA: Insufficient documentation

## 2013-09-25 DIAGNOSIS — R5383 Other fatigue: Secondary | ICD-10-CM

## 2013-09-25 DIAGNOSIS — W19XXXA Unspecified fall, initial encounter: Secondary | ICD-10-CM

## 2013-09-25 DIAGNOSIS — R269 Unspecified abnormalities of gait and mobility: Secondary | ICD-10-CM | POA: Insufficient documentation

## 2013-09-25 DIAGNOSIS — I4891 Unspecified atrial fibrillation: Secondary | ICD-10-CM | POA: Insufficient documentation

## 2013-09-25 DIAGNOSIS — IMO0002 Reserved for concepts with insufficient information to code with codable children: Secondary | ICD-10-CM | POA: Insufficient documentation

## 2013-09-25 DIAGNOSIS — E669 Obesity, unspecified: Secondary | ICD-10-CM | POA: Insufficient documentation

## 2013-09-25 DIAGNOSIS — R5381 Other malaise: Secondary | ICD-10-CM | POA: Insufficient documentation

## 2013-09-25 DIAGNOSIS — E039 Hypothyroidism, unspecified: Secondary | ICD-10-CM | POA: Insufficient documentation

## 2013-09-25 DIAGNOSIS — Z79899 Other long term (current) drug therapy: Secondary | ICD-10-CM | POA: Insufficient documentation

## 2013-09-25 DIAGNOSIS — S81809A Unspecified open wound, unspecified lower leg, initial encounter: Secondary | ICD-10-CM

## 2013-09-25 DIAGNOSIS — Z7901 Long term (current) use of anticoagulants: Secondary | ICD-10-CM | POA: Insufficient documentation

## 2013-09-25 DIAGNOSIS — Z8673 Personal history of transient ischemic attack (TIA), and cerebral infarction without residual deficits: Secondary | ICD-10-CM | POA: Insufficient documentation

## 2013-09-25 DIAGNOSIS — Z9851 Tubal ligation status: Secondary | ICD-10-CM | POA: Insufficient documentation

## 2013-09-25 LAB — PROTIME-INR
INR: 1.92 — ABNORMAL HIGH (ref 0.00–1.49)
PROTHROMBIN TIME: 21.4 s — AB (ref 11.6–15.2)

## 2013-09-25 LAB — CBC WITH DIFFERENTIAL/PLATELET
BASOS ABS: 0 10*3/uL (ref 0.0–0.1)
BASOS PCT: 0 % (ref 0–1)
Eosinophils Absolute: 0.2 10*3/uL (ref 0.0–0.7)
Eosinophils Relative: 2 % (ref 0–5)
HEMATOCRIT: 43.4 % (ref 36.0–46.0)
Hemoglobin: 14.8 g/dL (ref 12.0–15.0)
Lymphocytes Relative: 22 % (ref 12–46)
Lymphs Abs: 2.3 10*3/uL (ref 0.7–4.0)
MCH: 31.2 pg (ref 26.0–34.0)
MCHC: 34.1 g/dL (ref 30.0–36.0)
MCV: 91.4 fL (ref 78.0–100.0)
MONO ABS: 1 10*3/uL (ref 0.1–1.0)
Monocytes Relative: 10 % (ref 3–12)
NEUTROS ABS: 7 10*3/uL (ref 1.7–7.7)
Neutrophils Relative %: 66 % (ref 43–77)
PLATELETS: 156 10*3/uL (ref 150–400)
RBC: 4.75 MIL/uL (ref 3.87–5.11)
RDW: 13.6 % (ref 11.5–15.5)
WBC: 10.5 10*3/uL (ref 4.0–10.5)

## 2013-09-25 LAB — COMPREHENSIVE METABOLIC PANEL
ALK PHOS: 58 U/L (ref 39–117)
ALT: 16 U/L (ref 0–35)
AST: 38 U/L — ABNORMAL HIGH (ref 0–37)
Albumin: 4.1 g/dL (ref 3.5–5.2)
BILIRUBIN TOTAL: 0.3 mg/dL (ref 0.3–1.2)
BUN: 10 mg/dL (ref 6–23)
CHLORIDE: 104 meq/L (ref 96–112)
CO2: 24 mEq/L (ref 19–32)
Calcium: 9.3 mg/dL (ref 8.4–10.5)
Creatinine, Ser: 0.77 mg/dL (ref 0.50–1.10)
GFR calc Af Amer: >90 mL/min (ref >90)
GFR calc non Af Amer: 81 mL/min — ABNORMAL LOW (ref >90)
GLUCOSE: 86 mg/dL (ref 70–99)
POTASSIUM: 4.1 meq/L (ref 3.7–5.3)
SODIUM: 143 meq/L (ref 137–147)
TOTAL PROTEIN: 7.2 g/dL (ref 6.0–8.3)

## 2013-09-25 LAB — POC OCCULT BLOOD, ED: Fecal Occult Bld: NEGATIVE

## 2013-09-25 NOTE — Clinical Social Work Note (Signed)
Clinical Social Work Department BRIEF PSYCHOSOCIAL ASSESSMENT 09/25/2013  Patient:  Stacey Davenport, Stacey Davenport     Account Number:  [id number]     Admit date:  09/25/2013  Clinical Social Worker:  Jennette Dubin  Date/Time:  09/25/2013 10:21 AM  Referred by:  CSW  Date Referred:  09/25/2013 Referred for  Possible ALF Placement   Other Referral:  N/A Interview type:  Patient Other interview type: Family  PSYCHOSOCIAL DATA Living Status:  ALONE Admitted from facility:  N/A Level of care:  N/A Primary support name:  Renella Steig [phone] Primary support relationship to patient:  CHILD, ADULT Degree of support available:   Good support    CURRENT CONCERNS  Other Concerns:   Pt requesting Physical Therapy in home    [place] / PLAN Pt presented to [facility] for recent fall. Pt.'s son, Shanon Brow at bedside. According to pt/family pt has a history of mini-strokes which has caused the falls. Pt reported that she got up too quickly from a seated postition, got lightheaded and fell.  Pt was receiving PT which assisted with mobility and strengthening. CM, Jamse Arn made arrangements with [place] for a PT Eval and Treat in the home. Pt lives in an apt complex for the elderly. Pt states that she cooks for herself but sometimes is too weak to do so and in those cases something very light is prepared. Pt/family was resources for PACE program for pt.'s area, Meals on Wheels and ALF listings. Pt to be discharged home.   Assessment/plan status:  No Further Intervention Required Other assessment/ plan:  N/A Information/referral to community resources:   [employer] [facility] Whe[facility]  ALF listings    PATIENT'S/FAMILY'S RESPONSE TO PLAN OF CARE: Pt/family understood the role of CSW. Pt/family very appreciatve of information/resources given. No further intervention needed.

## 2013-09-25 NOTE — Progress Notes (Signed)
Case Manager met Patient at bedside and patients Son Stacey Davenport.Role of CM explained.Patient reports today's ED visit is secondary to a fall at home. Patient reports she felt dizzy.Patient resides at home alone.She reports good support from her son.Patient reports she ambulates with a cane or  Walker and has residual weakness from a previous CVA.Patient reports she is compliant with her daily Medications, and has home delivery of her  Medications.Patient has [facility] [employer] [facility] and follows up with her PCP Dr Laurance Flatten on a regular basis.Patient reports she would like to have home PT  Services.CM explained Choice List and home health services.Patient elects only to have PT services.Patient reports she had [facility]  In the Past and would like to elect them.Patient educated it can take 72 hours for services to resume.Patients demographics confirmed and Patient  Son provided with Contact number resource leaflet for [facility].

## 2013-09-25 NOTE — Discharge Instructions (Signed)
The Steri-Strips placed on your left arm will peel off by themselves after several days. Keep your wounds clean and dry. Wash the wound on your left knee daily with soap and water and then place a thin layer of bacitracin ointment over the wound and cover with a sterile bandage. Always use your walker to prevent falls. Your INR(Coumadin level) was mildly low at 1.92. Take her Coumadin as directed. Call Dr.Moore today to arrange to get your INR rechecked in a week. Return if your condition worsens for any reason. Take Tylenol as directed for pain

## 2013-09-25 NOTE — ED Notes (Signed)
[place] this am when getting up to go to the bathroom- states "I just got dizzy" lac to left forearm and left knee. Able to bear weight

## 2013-09-25 NOTE — ED Notes (Signed)
Ambulated pt to restroom. Ice applied to left knee.

## 2013-09-25 NOTE — Telephone Encounter (Signed)
This would probably be a good consideration for her since she is having falls and said she has had a stroke in the past.

## 2013-09-25 NOTE — Telephone Encounter (Signed)
hosp f/u set up for pt - will discuss further at appt

## 2013-09-25 NOTE — Telephone Encounter (Signed)
Stacey Davenport fell this morning about 5 am (was not using cane) Called nikki and david - and she had cut on arm and hurt leg (no broken bones) Doing a lot of test and then will discharge.  Dr in [place]. thinks she needs therapy Paramedic thought she shouldn't be living alone  They want [place] opinion on what to do next: [place] for therapy In home therapy Assisted living Skilled care?  Lexine Baton stated that last time she had in home therapy - she quit doing anything when they quit coming

## 2013-09-25 NOTE — Progress Notes (Signed)
Discharge plan discussed with Dr Winfred Leeds.Orders placed for PT services.CM called new patient referral to [facility].Teach back method used to  Ensure Patient / Son understanding.No further CM needs identified.

## 2013-09-25 NOTE — ED Provider Notes (Signed)
CSN: [id number]     Arrival date & time 09/25/13  0711 History   First MD Initiated Contact with Patient 09/25/13 0715     No chief complaint on file.    (Consider location/radiation/quality/duration/timing/severity/associated sxs/prior Treatment) Patient is a 73 y.o. female presenting with fall.  Fall Associated symptoms include abdominal pain.   Patient fell in her home at 5:10 AM today while walking to the bathroom. She was lightheaded before falling. She was not using her cane or walker at the time of the fall .She's been lightheaded for approximately month. Lightheadedness Not made worse or better by anything. As result fall she injured her left forearm and left knee with lacerations at left forearm and left knee. She was treated by bandaging which was done by her son with good hemostasis. She denies striking her head denies syncope denies chest pain. Does admit to abdominal pain at epigastrium, nonradiating worse with eating for approximately one month. Last bowel movement yesterday, normal. No urinary symptoms. No abdominal pain presently. Pain at left forearm and left knee is nonradiating, minimal at present. Past Medical History  Diagnosis Date  . Esophageal stricture   . Gastritis   . GERD (gastroesophageal reflux disease)   . Osteoarthritis   . Glaucoma   . Rheumatoid arthritis(714.0)   . Asthma   . Dysrhythmia     hx of irregular heart beat  . Shortness of breath   . Hypothyroidism   . Hypertension   . Pneumonia   . 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 Atrial fibrillation    Past Surgical History  Procedure Laterality Date  . Tubal ligation    . Appendectomy    . Right eye lid surgery    . Bilateral cataract extraction    . Cholecystectomy     Family History  Problem Relation Age of Onset  . Colon cancer Neg Hx   . Diabetes Mother   . Diabetes Sister    History  Substance Use Topics  .  Smoking status: Never Smoker   . Smokeless tobacco: Never Used  . Alcohol Use: No   OB History   Grav Para Term Preterm Abortions TAB SAB Ect Mult Living                 Review of Systems  Gastrointestinal: Positive for abdominal pain.       Epigastric pain times one month, worse for eating  Musculoskeletal: Positive for gait problem.       Walks with cane or walker  Skin: Positive for wound.       Lacerations to left knee left forearm  Neurological: Positive for weakness.       General weakness times one monrth      Allergies  Aciphex; Amoxicillin; Biaxin; Cephalexin; Esomeprazole magnesium; Hydrocodone; Hydrocodone-acetaminophen; Ketek; Latex; Levaquin; Lipitor; Macrobid; Moxifloxacin; Nexium; Rabeprazole sodium; Sulfonamide derivatives; Vesicare; and Zithromax  Home Medications   Current Outpatient Rx  Name  Route  Sig  Dispense  Refill  . acetaminophen (TYLENOL) 325 MG tablet   Oral   Take 650 mg by mouth every 6 (six) hours as needed for pain.         Marland Kitchen albuterol (PROVENTIL) (2.5 MG/3ML) 0.083% nebulizer solution   Nebulization   Take 3 mLs (2.5 mg total) by nebulization every 6 (six) hours as needed for wheezing.   375 mL   2   . amiodarone (PACERONE)  200 MG tablet   Oral   Take 1 tablet (200 mg total) by mouth daily.   30 tablet   11   . brimonidine (ALPHAGAN) 0.2 % ophthalmic solution   Both Eyes   Place 1 drop into both eyes 3 (three) times daily.   5 mL   11   . budesonide (PULMICORT) 0.5 MG/2ML nebulizer solution   Nebulization   Take 2 mLs (0.5 mg total) by nebulization 2 (two) times daily.   120 mL   2   . calcium-vitamin D (OSCAL) 250-125 MG-UNIT per tablet   Oral   Take 1 tablet by mouth daily.           . cholecalciferol (VITAMIN D) 1000 UNITS tablet   Oral   Take 2,000 Units by mouth daily.         . ciclesonide (ALVESCO) 160 MCG/ACT inhaler   Inhalation   Inhale 1 puff into the lungs 2 (two) times daily.   1 Inhaler   2    . dexlansoprazole (DEXILANT) 60 MG capsule   Oral   Take 1 capsule (60 mg total) by mouth daily.   30 capsule   11   . diltiazem (CARDIZEM CD) 180 MG 24 hr capsule   Oral   Take 1 capsule (180 mg total) by mouth 2 (two) times daily.   60 capsule   2   . doxycycline (VIBRA-TABS) 100 MG tablet      1 twice daily on the first full day with food then one daily for infection until complete   11 tablet   0   . fluticasone (FLONASE) 50 MCG/ACT nasal spray   Nasal   Place 1 spray into the nose daily.   16 g   11   . furosemide (LASIX) 20 MG tablet   Oral   Take 1 tablet (20 mg total) by mouth as needed.   30 tablet   6   . guaiFENesin (ORGAN-I NR) 200 MG tablet      Take 2 tablets every 4 hours as needed for congestion   30 tablet   0   . latanoprost (XALATAN) 0.005 % ophthalmic solution   Both Eyes   Place 1 drop into both eyes at bedtime.   2.5 mL   11   . levalbuterol (XOPENEX HFA) 45 MCG/ACT inhaler   Inhalation   Inhale 2 puffs into the lungs 2 (two) times daily.   1 Inhaler   11   . levothyroxine (SYNTHROID, LEVOTHROID) 75 MCG tablet   Oral   Take 75 mcg by mouth daily before breakfast. Take once daily         . loratadine (CLARITIN) 10 MG tablet   Oral   Take 1 tablet (10 mg total) by mouth daily.   30 tablet   11   . LORazepam (ATIVAN) 0.5 MG tablet   Oral   Take 1-2 tablets (0.5-1 mg total) by mouth daily as needed. For anxiety   30 tablet   2   . meclizine (ANTIVERT) 25 MG tablet   Oral   Take 25 mg by mouth as needed.         . montelukast (SINGULAIR) 10 MG tablet   Oral   Take 1 tablet (10 mg total) by mouth daily.   30 tablet   11   . Olopatadine HCl (PATADAY) 0.2 % SOLN   Ophthalmic   Apply 1 drop to eye daily.   2.5 mL  2   . potassium chloride (K-DUR,KLOR-CON) 10 MEQ tablet   Oral   Take 1 tablet (10 mEq total) by mouth daily.   30 tablet   11   . predniSONE (DELTASONE) 5 MG tablet   Oral   Take 1 tablet (5 mg  total) by mouth daily.   30 tablet   11   . traMADol (ULTRAM) 50 MG tablet   Oral   Take 1 tablet (50 mg total) by mouth every 6 (six) hours as needed for pain.   15 tablet   0   . warfarin (COUMADIN) 4 MG tablet   Oral   Take 0.5-1 tablets (2-4 mg total) by mouth daily. Take 1 tablet Tuesday, Thursday and Saturday then take 1/2 tablet on Monday, Wednesday, Friday and Sunday   30 tablet   11    There were no vitals taken for this visit. Physical Exam  Nursing note and vitals reviewed. Constitutional: She is oriented to person, place, and time. She appears well-developed and well-nourished.  HENT:  Head: Normocephalic and atraumatic.  Right Ear: External ear normal.  Left Ear: External ear normal.  Eyes: Conjunctivae are normal. Pupils are equal, round, and reactive to light.  Neck: Neck supple. No tracheal deviation present. No thyromegaly present.  Cardiovascular: Normal rate and regular rhythm.   No murmur heard. Pulmonary/Chest: Effort normal and breath sounds normal.  Abdominal: Soft. Bowel sounds are normal. She exhibits no distension. There is no tenderness.  Obese  Genitourinary: Guaiac negative stool.  Rectum normal tone brown stool, minimal stool in vault, Hemoccult negative  Musculoskeletal: Normal range of motion. She exhibits no edema and no tenderness.  Pelvis stable nontender. Entire spine nontender. Left upper extremity there is a 5 cm skin tear at the dorsal forearm. No active bleeding no deformity radial pulse 2+. Left lower extremity is a 3 cm skin tear of the anterior knee not the bleeding or deformity no swelling DP pulse 2+ right upper and right lower extremities well to 40 swelling or contusion or with abrasion, neurovascularly intact  Neurological: She is alert and oriented to person, place, and time. No cranial nerve deficit. Coordination normal.  Skin: Skin is warm and dry. No rash noted.  Psychiatric: She has a normal mood and affect.    ED Course   Procedures (including critical care time) Labs Review Labs Reviewed - No data to display Imaging Review No results found.   EKG Interpretation   Date/Time:  Tuesday September 25 2013 07:38:51 EST Ventricular Rate:  75 PR Interval:  220 QRS Duration: 100 QT Interval:  406 QTC Calculation: 453 R Axis:   -159 Text Interpretation:  Sinus rhythm Atrial premature complex Prolonged PR  interval Right axis deviation Low voltage, extremity and precordial leads  Consider anterior infarct No significant change since last tracing  Confirmed by Winfred Leeds  MD, Raeya Merritts [phone]) on 09/25/2013 8:08:18 AM      Results for orders placed during the hospital encounter of 09/25/13  COMPREHENSIVE METABOLIC PANEL      Result Value Ref Range   Sodium 143  137 - 147 mEq/L   Potassium 4.1  3.7 - 5.3 mEq/L   Chloride 104  96 - 112 mEq/L   CO2 24  19 - 32 mEq/L   Glucose, Bld 86  70 - 99 mg/dL   BUN 10  6 - 23 mg/dL   Creatinine, Ser 0.77  0.50 - 1.10 mg/dL   Calcium 9.3  8.4 - 10.5 mg/dL  Total Protein 7.2  6.0 - 8.3 g/dL   Albumin 4.1  3.5 - 5.2 g/dL   AST 38 (*) 0 - 37 U/L   ALT 16  0 - 35 U/L   Alkaline Phosphatase 58  39 - 117 U/L   Total Bilirubin 0.3  0.3 - 1.2 mg/dL   GFR calc non Af Amer 81 (*) >90 mL/min   GFR calc Af Amer >90  >90 mL/min  CBC WITH DIFFERENTIAL      Result Value Ref Range   WBC 10.5  4.0 - 10.5 K/uL   RBC 4.75  3.87 - 5.11 MIL/uL   Hemoglobin 14.8  12.0 - 15.0 g/dL   HCT 43.4  36.0 - 46.0 %   MCV 91.4  78.0 - 100.0 fL   MCH 31.2  26.0 - 34.0 pg   MCHC 34.1  30.0 - 36.0 g/dL   RDW 13.6  11.5 - 15.5 %   Platelets 156  150 - 400 K/uL   Neutrophils Relative % 66  43 - 77 %   Neutro Abs 7.0  1.7 - 7.7 K/uL   Lymphocytes Relative 22  12 - 46 %   Lymphs Abs 2.3  0.7 - 4.0 K/uL   Monocytes Relative 10  3 - 12 %   Monocytes Absolute 1.0  0.1 - 1.0 K/uL   Eosinophils Relative 2  0 - 5 %   Eosinophils Absolute 0.2  0.0 - 0.7 K/uL   Basophils Relative 0  0 - 1 %   Basophils  Absolute 0.0  0.0 - 0.1 K/uL  PROTIME-INR      Result Value Ref Range   Prothrombin Time 21.4 (*) 11.6 - 15.2 seconds   INR 1.92 (*) 0.00 - 1.49  POC OCCULT BLOOD, ED      Result Value Ref Range   Fecal Occult Bld NEGATIVE  NEGATIVE   No results found.  The skin tear at the left forearm was cleansed and Steri-Strips applied by the nurse. A sterile bandage was placed over the wound at the left knee after the area was cleansed. 10 AM patient is alert ambulatory with  walker not lightheaded on standing. MDM   Final diagnoses:  None   wounds on left knee and left forearm are not amenable to suture Home health care and physical therapy consult ordered. Plan local wound care Follow up with Dr. Laurance Flatten next week. INR is mildly subtherapeutic however patient has not taken her Coumadin yet today. She should get her INR rechecked in a week. Diagnosis #1 weakness #2 fall #3 skin tears left forearm and left knee #4 subtherapeutic INR    Orlie Dakin, MD 09/25/13 1113

## 2013-10-01 ENCOUNTER — Other Ambulatory Visit: Payer: Medicare Other

## 2013-10-01 DIAGNOSIS — Z1212 Encounter for screening for malignant neoplasm of rectum: Secondary | ICD-10-CM

## 2013-10-01 NOTE — Progress Notes (Signed)
Patient dropped off fobt 

## 2013-10-02 ENCOUNTER — Encounter: Payer: Self-pay | Admitting: Family Medicine

## 2013-10-02 ENCOUNTER — Ambulatory Visit (INDEPENDENT_AMBULATORY_CARE_PROVIDER_SITE_OTHER): Payer: Medicare Other | Admitting: Family Medicine

## 2013-10-02 ENCOUNTER — Other Ambulatory Visit: Payer: Medicare Other | Admitting: Nurse Practitioner

## 2013-10-02 VITALS — BP 99/56 | HR 66 | Temp 96.6°F | Ht 64.0 in | Wt 199.0 lb

## 2013-10-02 DIAGNOSIS — I4891 Unspecified atrial fibrillation: Secondary | ICD-10-CM

## 2013-10-02 DIAGNOSIS — K219 Gastro-esophageal reflux disease without esophagitis: Secondary | ICD-10-CM

## 2013-10-02 DIAGNOSIS — S91009A Unspecified open wound, unspecified ankle, initial encounter: Secondary | ICD-10-CM

## 2013-10-02 DIAGNOSIS — Z79899 Other long term (current) drug therapy: Secondary | ICD-10-CM

## 2013-10-02 DIAGNOSIS — J45909 Unspecified asthma, uncomplicated: Secondary | ICD-10-CM

## 2013-10-02 DIAGNOSIS — Z889 Allergy status to unspecified drugs, medicaments and biological substances status: Secondary | ICD-10-CM

## 2013-10-02 DIAGNOSIS — S81009A Unspecified open wound, unspecified knee, initial encounter: Secondary | ICD-10-CM

## 2013-10-02 DIAGNOSIS — S81002A Unspecified open wound, left knee, initial encounter: Secondary | ICD-10-CM

## 2013-10-02 DIAGNOSIS — I10 Essential (primary) hypertension: Secondary | ICD-10-CM

## 2013-10-02 DIAGNOSIS — S41102A Unspecified open wound of left upper arm, initial encounter: Secondary | ICD-10-CM

## 2013-10-02 DIAGNOSIS — S41109A Unspecified open wound of unspecified upper arm, initial encounter: Secondary | ICD-10-CM

## 2013-10-02 DIAGNOSIS — E785 Hyperlipidemia, unspecified: Secondary | ICD-10-CM

## 2013-10-02 DIAGNOSIS — S81809A Unspecified open wound, unspecified lower leg, initial encounter: Secondary | ICD-10-CM

## 2013-10-02 LAB — POCT INR: INR: 2.5

## 2013-10-02 LAB — FECAL OCCULT BLOOD, IMMUNOCHEMICAL: Fecal Occult Bld: NEGATIVE

## 2013-10-02 NOTE — Patient Instructions (Addendum)
Continue current medications. Continue good therapeutic lifestyle changes which include good diet and exercise. Fall precautions discussed with patient. If an FOBT was given today- please return it to our front desk. If you are over 74 years old - you may need Prevnar 43 or the adult Pneumonia vaccine.   please consider getting a lifeline Monitor blood pressures once or twice daily We will try to arrange a visit with the cardiologist to see if current medications need to be adjusted based on blood pressures at home Change dressings to arm once or twice daily using saline wet-to-dry and a small amount of cortisone TeN to the area around the wound where there appears to be an allergic dermatitis Continue to dress the wound of the left knee using Betadine cleansing with saline wet-to-dry compresses

## 2013-10-02 NOTE — Progress Notes (Signed)
Subjective:    Patient ID: Stacey Davenport, female    DOB: [birth year], 73 y.o.   MRN: [id number]  HPI Patient here today for hospital follow up from [facility]. The patient comes in with her daughter-in-law today. She is looking into a special program that while take care of her at home and get her out of the house at the same time.      Patient Active Problem List   Diagnosis Date Noted  . High risk medication use 09/03/2013  . Multiple drug allergies 09/03/2013  . 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 Hypothyroidism 04/23/2013  . Hyperlipidemia 12/06/2012  . CVA History 12/06/2012  . Atrial fibrillation 09/30/2011  . Hypertension 08/20/2009  . Glaucoma 04/17/2009  . ASTHMA 04/17/2009  . ESOPHAGEAL STRICTURE 04/17/2009  . GERD 04/17/2009  . Rheumatoid arthritis 04/17/2009   Outpatient Encounter Prescriptions as of 10/02/2013  Medication Sig  . acetaminophen (TYLENOL) 325 MG tablet Take 650 mg by mouth every 6 (six) hours as needed for pain.  Marland Kitchen albuterol (PROVENTIL) (2.5 MG/3ML) 0.083% nebulizer solution Take 3 mLs (2.5 mg total) by nebulization every 6 (six) hours as needed for wheezing.  Marland Kitchen amiodarone (PACERONE) 200 MG tablet Take 1 tablet (200 mg total) by mouth daily.  . brimonidine (ALPHAGAN) 0.2 % ophthalmic solution Place 1 drop into both eyes 3 (three) times daily.  . budesonide (PULMICORT) 0.5 MG/2ML nebulizer solution Take 2 mLs (0.5 mg total) by nebulization 2 (two) times daily.  . calcium-vitamin D (OSCAL) 250-125 MG-UNIT per tablet Take 1 tablet by mouth daily.    . cholecalciferol (VITAMIN D) 1000 UNITS tablet Take 2,000 Units by mouth daily.  . ciclesonide (ALVESCO) 160 MCG/ACT inhaler Inhale 1 puff into the lungs 2 (two) times daily.  Marland Kitchen dexlansoprazole (DEXILANT) 60 MG capsule Take 1 capsule (60 mg total) by mouth daily.  Marland Kitchen diltiazem (CARDIZEM CD) 180 MG 24 hr capsule Take 1 capsule (180 mg total) by mouth 2 (two) times daily.  . fluticasone (FLONASE) 50  MCG/ACT nasal spray Place 1 spray into the nose daily.  . furosemide (LASIX) 20 MG tablet Take 20 mg by mouth as needed for fluid or edema.  Marland Kitchen guaiFENesin (ORGAN-I NR) 200 MG tablet Take 2 tablets every 4 hours as needed for congestion  . latanoprost (XALATAN) 0.005 % ophthalmic solution Place 1 drop into both eyes at bedtime.  . levalbuterol (XOPENEX HFA) 45 MCG/ACT inhaler Inhale 2 puffs into the lungs 2 (two) times daily.  Marland Kitchen levothyroxine (SYNTHROID, LEVOTHROID) 75 MCG tablet Take 75 mcg by mouth daily before breakfast. Take once daily  . loratadine (CLARITIN) 10 MG tablet Take 1 tablet (10 mg total) by mouth daily.  Marland Kitchen LORazepam (ATIVAN) 0.5 MG tablet Take 1-2 tablets (0.5-1 mg total) by mouth daily as needed. For anxiety  . montelukast (SINGULAIR) 10 MG tablet Take 1 tablet (10 mg total) by mouth daily.  . Olopatadine HCl 0.2 % SOLN Apply 1 drop to eye daily.  . potassium chloride (K-DUR,KLOR-CON) 10 MEQ tablet Take 1 tablet (10 mEq total) by mouth daily.  . predniSONE (DELTASONE) 5 MG tablet Take 1 tablet (5 mg total) by mouth daily.  . traMADol (ULTRAM) 50 MG tablet Take 1 tablet (50 mg total) by mouth every 6 (six) hours as needed for pain.  Marland Kitchen warfarin (COUMADIN) 4 MG tablet Take 2-4 mg by mouth daily. Take 1 tablet Monday, Wednesday and Friday and 1/2 tablet all other days  . [DISCONTINUED] meclizine (ANTIVERT) 25 MG tablet Take  25 mg by mouth as needed for dizziness.   . [DISCONTINUED] doxycycline (VIBRA-TABS) 100 MG tablet 1 twice daily on the first full day with food then one daily for infection until complete    Review of Systems  Constitutional: Negative.   HENT: Negative.   Eyes: Negative.   Respiratory: Negative.   Cardiovascular: Negative.   Gastrointestinal: Positive for diarrhea (this morning).  Endocrine: Negative.   Genitourinary: Negative.   Musculoskeletal: Negative.        [facility] to [facility] next week.   [facility] - ? About  the program ([facility])  Skin: Negative.        Left lower arm wound- redress today  Allergic/Immunologic: Negative.   Neurological: Negative.   Hematological: Negative.   Psychiatric/Behavioral: Negative.        Objective:   Physical Exam  Nursing note and vitals reviewed. Constitutional: She is oriented to person, place, and time. She appears well-developed and well-nourished.  For her age  HENT:  Head: Normocephalic and atraumatic.  Right Ear: External ear normal.  Left Ear: External ear normal.  Nose: Nose normal.  Mouth/Throat: Oropharynx is clear and moist.  Eyes: Conjunctivae and EOM are normal. Pupils are equal, round, and reactive to light. Right eye exhibits no discharge. Left eye exhibits no discharge. No scleral icterus.  Neck: Normal range of motion. Neck supple. No thyromegaly present.  Cardiovascular: Normal rate, regular rhythm, normal heart sounds and intact distal pulses.  Exam reveals no gallop and no friction rub.   No murmur heard. At 72 per minute  Pulmonary/Chest: Effort normal and breath sounds normal. No respiratory distress. She has no wheezes. She has no rales.  Abdominal: Bowel sounds are normal.  Musculoskeletal:  Patient uses a rolling walker because of he recent CVA  Lymphadenopathy:    She has no cervical adenopathy.  Neurological: She is alert and oriented to person, place, and time.  Skin: Skin is warm and dry. Rash (atopic dermatitis around the wound on left forearm) noted.  She has a wound of the left forearm that has a amount of dry blood in it is a longitudinal wound. The area was cleansed and saline wet-to-dry was placed on this with surrounding gauze  Psychiatric: She has a normal mood and affect. Her behavior is normal. Judgment and thought content normal.   BP 99/56  Pulse 66  Temp(Src) 96.6 F (35.9 C) (Oral)  Ht 5\' 4"  (1.626 m)  Wt 199 lb (90.266 kg)  BMI 34.14 kg/m2  SpO2 96%        Assessment & Plan:  1. A-fib - POCT  INR  2. Hypertension  3. ASTHMA  4. GERD  5. Atrial fibrillation  6. Hyperlipidemia  7. High risk medication use  8. Multiple drug allergies  9. Open wound of left upper arm  10. Open wound of left knee   Patient Instructions  Continue current medications. Continue good therapeutic lifestyle changes which include good diet and exercise. Fall precautions discussed with patient. If an FOBT was given today- please return it to our front desk. If you are over 58 years old - you may need Prevnar 33 or the adult Pneumonia vaccine.   please consider getting a lifeline Monitor blood pressures once or twice daily We will try to arrange a visit with the cardiologist to see if current medications need to be adjusted based on blood pressures at home Change dressings to arm once or twice daily  using saline wet-to-dry and a small amount of cortisone TeN to the area around the wound where there appears to be an allergic dermatitis Continue to dress the wound of the left knee using Betadine cleansing with saline wet-to-dry compresses   Arrie Senate MD    Arrie Senate MD

## 2013-10-03 ENCOUNTER — Telehealth: Payer: Self-pay | Admitting: Family Medicine

## 2013-10-03 ENCOUNTER — Encounter: Payer: Self-pay | Admitting: *Deleted

## 2013-10-03 NOTE — Progress Notes (Signed)
Quick Note:  Copy of labs sent to patient ______ 

## 2013-10-04 NOTE — Telephone Encounter (Signed)
Spoke with daughter-in-law who has some concerns about Yalitza's mental state recently. She has exhibited some confusion and lapse of memory. Recent fall but no head injury. Denies UTI symptoms. Explained that sometimes these can be vague in older adults and if confusion worsens this could be the problem. She has a f/u appt in 2 weeks. We can do an MMSE at that time. She will call back if she needs to be seen sooner.  Also concerned about reaction to bandaid and polysporin. She developed a rash around her skin tear. Since then they have been applying cortisone to the rash and covering the wound with a wet to dry dressing. No tape on her exposed skin. At times it is difficult to remove the gauze without damaging the tissue. Suggested that she saturate the gauze with NS prior to removing it. Remove it slowly and if there is resistance she should allow it to soak for a few more minutes. Continue to clean with NS and cover with gauze. Call or return to clinic if needed.   She stated understanding and agreement to plan.

## 2013-10-08 ENCOUNTER — Encounter: Payer: Self-pay | Admitting: *Deleted

## 2013-10-11 ENCOUNTER — Other Ambulatory Visit: Payer: Self-pay | Admitting: Family Medicine

## 2013-10-11 ENCOUNTER — Encounter: Payer: Self-pay | Admitting: Cardiology

## 2013-10-11 ENCOUNTER — Encounter (INDEPENDENT_AMBULATORY_CARE_PROVIDER_SITE_OTHER): Payer: Self-pay

## 2013-10-11 ENCOUNTER — Ambulatory Visit (INDEPENDENT_AMBULATORY_CARE_PROVIDER_SITE_OTHER): Payer: Medicare Other | Admitting: Cardiology

## 2013-10-11 VITALS — BP 171/83 | HR 66 | Ht 64.0 in | Wt 190.4 lb

## 2013-10-11 DIAGNOSIS — I10 Essential (primary) hypertension: Secondary | ICD-10-CM

## 2013-10-11 DIAGNOSIS — I4891 Unspecified atrial fibrillation: Secondary | ICD-10-CM

## 2013-10-11 MED ORDER — DILTIAZEM HCL ER COATED BEADS 120 MG PO CP24: 120.0000 mg | ORAL_CAPSULE | Freq: Two times a day (BID) | ORAL | Status: DC

## 2013-10-11 NOTE — Progress Notes (Signed)
HPI The patient presents for followup of her atrial fibrillation.  She was recently in the hospital after a syncopal episode. She get up from bed to go to the bathroom. She made several steps and then she apparently had frank syncope. She did scrape up her arm. I reviewed the emergency room records. She was not particularly hypotensive. There was no clear etiology. She otherwise has not had any presyncope or syncope. She rarely has palpitations now. She denies any chest pressure, neck or arm discomfort. She's had no new shortness of breath, PND or orthopnea. Her vision which had been decreasing and a significant concern for her is about the same as it had previously.  Allergies  Allergen Reactions  . Aciphex [Rabeprazole Sodium]   . Amoxicillin   . Biaxin [Clarithromycin] Hives and Itching  . Cephalexin   . Esomeprazole Magnesium     Takes Dexilant at home  . Hydrocodone   . Hydrocodone-Acetaminophen   . Ketek [Telithromycin]   . Latex   . Levaquin [Levofloxacin In D5w]   . Lipitor [Atorvastatin]   . Macrobid [Nitrofurantoin Macrocrystal]   . Moxifloxacin   . Nexium [Esomeprazole Magnesium]   . Rabeprazole Sodium     Takes Dexilant at home  . Sulfonamide Derivatives   . Vesicare [Solifenacin]   . Zithromax [Azithromycin]     Current Outpatient Prescriptions  Medication Sig Dispense Refill  . acetaminophen (TYLENOL) 325 MG tablet Take 650 mg by mouth every 6 (six) hours as needed for pain.      Marland Kitchen albuterol (PROVENTIL) (2.5 MG/3ML) 0.083% nebulizer solution Take 3 mLs (2.5 mg total) by nebulization every 6 (six) hours as needed for wheezing.  375 mL  2  . amiodarone (PACERONE) 200 MG tablet Take 1 tablet (200 mg total) by mouth daily.  30 tablet  11  . brimonidine (ALPHAGAN) 0.2 % ophthalmic solution Place 1 drop into both eyes 3 (three) times daily.  5 mL  11  . budesonide (PULMICORT) 0.5 MG/2ML nebulizer solution Take 2 mLs (0.5 mg total) by nebulization 2 (two) times daily.  120  mL  2  . calcium-vitamin D (OSCAL) 250-125 MG-UNIT per tablet Take 1 tablet by mouth daily.        . cholecalciferol (VITAMIN D) 1000 UNITS tablet Take 2,000 Units by mouth daily.      . ciclesonide (ALVESCO) 160 MCG/ACT inhaler Inhale 1 puff into the lungs 2 (two) times daily.  1 Inhaler  2  . dexlansoprazole (DEXILANT) 60 MG capsule Take 1 capsule (60 mg total) by mouth daily.  30 capsule  11  . diltiazem (CARDIZEM CD) 180 MG 24 hr capsule Take 1 capsule (180 mg total) by mouth 2 (two) times daily.  60 capsule  2  . fluticasone (FLONASE) 50 MCG/ACT nasal spray Place 1 spray into the nose daily.  16 g  11  . furosemide (LASIX) 20 MG tablet Take 20 mg by mouth as needed for fluid or edema.      Marland Kitchen guaiFENesin (ORGAN-I NR) 200 MG tablet Take 2 tablets every 4 hours as needed for congestion  30 tablet  0  . latanoprost (XALATAN) 0.005 % ophthalmic solution Place 1 drop into both eyes at bedtime.  2.5 mL  11  . levalbuterol (XOPENEX HFA) 45 MCG/ACT inhaler Inhale 2 puffs into the lungs 2 (two) times daily.  1 Inhaler  11  . levothyroxine (SYNTHROID, LEVOTHROID) 75 MCG tablet Take 75 mcg by mouth daily before breakfast. Take once daily      .  loratadine (CLARITIN) 10 MG tablet Take 1 tablet (10 mg total) by mouth daily.  30 tablet  11  . LORazepam (ATIVAN) 0.5 MG tablet Take 1-2 tablets (0.5-1 mg total) by mouth daily as needed. For anxiety  30 tablet  2  . montelukast (SINGULAIR) 10 MG tablet Take 1 tablet (10 mg total) by mouth daily.  30 tablet  11  . Olopatadine HCl 0.2 % SOLN Apply 1 drop to eye daily.      . potassium chloride (K-DUR,KLOR-CON) 10 MEQ tablet Take 1 tablet (10 mEq total) by mouth daily.  30 tablet  11  . predniSONE (DELTASONE) 5 MG tablet Take 1 tablet (5 mg total) by mouth daily.  30 tablet  11  . Probiotic Product (PROBIOTIC DAILY PO) Take 1 capsule by mouth daily.      . traMADol (ULTRAM) 50 MG tablet Take 1 tablet (50 mg total) by mouth every 6 (six) hours as needed for pain.   15 tablet  0  . warfarin (COUMADIN) 4 MG tablet Take 2-4 mg by mouth daily. Take 1 tablet Monday, Wednesday and Friday and 1/2 tablet all other days       No current facility-administered medications for this visit.    Past Medical History  Diagnosis Date  . Esophageal stricture   . Gastritis   . GERD (gastroesophageal reflux disease)   . Osteoarthritis   . Glaucoma   . Rheumatoid arthritis(714.0)   . Asthma   . Dysrhythmia     hx of irregular heart beat  . Shortness of breath   . Hypothyroidism   . Hypertension   . Pneumonia   . 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 Atrial fibrillation     Past Surgical History  Procedure Laterality Date  . Tubal ligation    . Appendectomy    . Right eye lid surgery    . Bilateral cataract extraction    . Cholecystectomy      ROS:  As stated in the HPI and negative for all other systems.  PHYSICAL EXAM BP 171/83  Pulse 66  Ht 5\' 4"  (1.626 m)  Wt 190 lb 6.4 oz (86.365 kg)  BMI 32.67 kg/m2 GENERAL:  Well appearing NECK:  No jugular venous distention, waveform within normal limits, carotid upstroke brisk and symmetric, no bruits, no thyromegaly LUNGS:  Clear to auscultation bilaterally CHEST:  Unremarkable HEART:  PMI not displaced or sustained,S1 and S2 within normal limits, no S3, no clicks, no rubs, no murmurs, irregular ABD:  Flat, positive bowel sounds normal in frequency in pitch, no bruits, no rebound, no guarding, no midline pulsatile mass, no hepatomegaly, no splenomegaly EXT:  2 plus pulses throughout, no edema, no cyanosis no clubbing SKIN:  Skin tears and bruising   ASSESSMENT AND PLAN  Atrial fibrillation -  She is not up-to-date with amiodarone followup.  I will ask her to get a TSH when she has had labs drawn soon by Dr. Laurance Flatten.  She will otherwise continue the meds as listed.    Lab Results  Component Value Date   TSH 2.867 12/06/2012   ALT 16  09/25/2013   AST 38* 09/25/2013   ALKPHOS 58 09/25/2013   BILITOT 0.3 09/25/2013   PROT 7.2 09/25/2013   ALBUMIN 4.1 09/25/2013   INR  Date Value Ref Range Status  10/02/2013 2.5   Final     qc ok  09/25/2013 1.92*  0.00 - 1.49 Final  08/14/2013 2.8   Final     QC WNL  07/03/2013 2.1   Final    Essential hypertension, benign -  Her BP is labile.  I reviewed the BP diary.   Her BP has been trending lower.  Given this and the episode of syncope I will change her to Cardizem 120 mg bid.

## 2013-10-11 NOTE — Patient Instructions (Signed)
Please decrease your Cardizem to 120 mg twice a day. Continue all other medications as listed.  Follow up in 4 months with Dr Percival Spanish in [place].  You will receive a letter in the mail 2 months before you are due.  Please call us when you receive this letter to schedule your follow up appointment.

## 2013-10-16 ENCOUNTER — Telehealth: Payer: Self-pay | Admitting: Family Medicine

## 2013-10-16 ENCOUNTER — Ambulatory Visit (INDEPENDENT_AMBULATORY_CARE_PROVIDER_SITE_OTHER): Payer: Medicare Other | Admitting: Family Medicine

## 2013-10-16 ENCOUNTER — Encounter: Payer: Self-pay | Admitting: Family Medicine

## 2013-10-16 VITALS — BP 131/62 | HR 56 | Temp 97.3°F | Ht 64.0 in | Wt 190.0 lb

## 2013-10-16 DIAGNOSIS — R413 Other amnesia: Secondary | ICD-10-CM

## 2013-10-16 DIAGNOSIS — E559 Vitamin D deficiency, unspecified: Secondary | ICD-10-CM

## 2013-10-16 DIAGNOSIS — R5381 Other malaise: Secondary | ICD-10-CM

## 2013-10-16 DIAGNOSIS — K219 Gastro-esophageal reflux disease without esophagitis: Secondary | ICD-10-CM

## 2013-10-16 DIAGNOSIS — T148XXA Other injury of unspecified body region, initial encounter: Secondary | ICD-10-CM

## 2013-10-16 DIAGNOSIS — E039 Hypothyroidism, unspecified: Secondary | ICD-10-CM

## 2013-10-16 DIAGNOSIS — E785 Hyperlipidemia, unspecified: Secondary | ICD-10-CM

## 2013-10-16 DIAGNOSIS — I4891 Unspecified atrial fibrillation: Secondary | ICD-10-CM

## 2013-10-16 DIAGNOSIS — R5383 Other fatigue: Secondary | ICD-10-CM

## 2013-10-16 DIAGNOSIS — I10 Essential (primary) hypertension: Secondary | ICD-10-CM

## 2013-10-16 LAB — POCT CBC
Granulocyte percent: 53 %G (ref 37–80)
HEMATOCRIT: 43.6 % (ref 37.7–47.9)
Hemoglobin: 13.5 g/dL (ref 12.2–16.2)
Lymph, poc: 2.2 (ref 0.6–3.4)
MCH, POC: 28.5 pg (ref 27–31.2)
MCHC: 31 g/dL — AB (ref 31.8–35.4)
MCV: 91.9 fL (ref 80–97)
MPV: 7.6 fL (ref 0–99.8)
POC Granulocyte: 2.9 (ref 2–6.9)
POC LYMPH PERCENT: 40.2 %L (ref 10–50)
Platelet Count, POC: 205 10*3/uL (ref 142–424)
RBC: 4.7 M/uL (ref 4.04–5.48)
RDW, POC: 14.2 %
WBC: 5.4 10*3/uL (ref 4.6–10.2)

## 2013-10-16 LAB — POCT INR: INR: 2.2

## 2013-10-16 NOTE — Patient Instructions (Signed)
Continue to be careful and do not put yourself at risk for falling Drink plenty of fluids Take medication as directed Followup with a new health care program as directed and let us know if there's anything else we need to do to help you with this program

## 2013-10-16 NOTE — Progress Notes (Signed)
Subjective:    Patient ID: Stacey Davenport, female    DOB: [birth year], 73 y.o.   MRN: [id number]  HPI Patient here today for 2 week follow up on arm sores. Patient will get her pt/INR and fasting labwork done today as well. Patient is accompanied today by her daughter-n-law. The cardiologist has changed one of her calcium channel blockers and the patient is feeling better after this has been done .      Patient Active Problem List   Diagnosis Date Noted  . High risk medication use 09/03/2013  . Multiple drug allergies 09/03/2013  . 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 Hypothyroidism 04/23/2013  . Hyperlipidemia 12/06/2012  . CVA History 12/06/2012  . Atrial fibrillation 09/30/2011  . Hypertension 08/20/2009  . Glaucoma 04/17/2009  . ASTHMA 04/17/2009  . ESOPHAGEAL STRICTURE 04/17/2009  . GERD 04/17/2009  . Rheumatoid arthritis 04/17/2009   Outpatient Encounter Prescriptions as of 10/16/2013  Medication Sig  . acetaminophen (TYLENOL) 325 MG tablet Take 650 mg by mouth every 6 (six) hours as needed for pain.  Marland Kitchen albuterol (PROVENTIL) (2.5 MG/3ML) 0.083% nebulizer solution Take 3 mLs (2.5 mg total) by nebulization every 6 (six) hours as needed for wheezing.  Marland Kitchen amiodarone (PACERONE) 200 MG tablet Take 1 tablet (200 mg total) by mouth daily.  . brimonidine (ALPHAGAN) 0.2 % ophthalmic solution PLACE 1 DROP IN EACH EYE 3 TIMES DAILY  . budesonide (PULMICORT) 0.5 MG/2ML nebulizer solution Take 2 mLs (0.5 mg total) by nebulization 2 (two) times daily.  . calcium-vitamin D (OSCAL) 250-125 MG-UNIT per tablet Take 1 tablet by mouth daily.    . cholecalciferol (VITAMIN D) 1000 UNITS tablet Take 2,000 Units by mouth daily.  . ciclesonide (ALVESCO) 160 MCG/ACT inhaler Inhale 1 puff into the lungs 2 (two) times daily.  Marland Kitchen DEXILANT 60 MG capsule TAKE ONE CAPSULE BY MOUTH TWICE A DAY  . diltiazem (CARDIZEM CD) 120 MG 24 hr capsule Take 1 capsule (120 mg total) by mouth 2 (two) times daily.    . fluticasone (FLONASE) 50 MCG/ACT nasal spray Place 1 spray into the nose daily.  . furosemide (LASIX) 20 MG tablet Take 20 mg by mouth as needed for fluid or edema.  Marland Kitchen guaiFENesin (ORGAN-I NR) 200 MG tablet Take 2 tablets every 4 hours as needed for congestion  . latanoprost (XALATAN) 0.005 % ophthalmic solution PLACE 1 DROP IN EACH EYE AT BEDTIME  . levothyroxine (SYNTHROID, LEVOTHROID) 75 MCG tablet Take 75 mcg by mouth daily before breakfast. Take once daily  . loratadine (CLARITIN) 10 MG tablet Take 1 tablet (10 mg total) by mouth daily.  Marland Kitchen LORazepam (ATIVAN) 0.5 MG tablet Take 1-2 tablets (0.5-1 mg total) by mouth daily as needed. For anxiety  . montelukast (SINGULAIR) 10 MG tablet TAKE ONE (1) TABLET EACH DAY  . Olopatadine HCl 0.2 % SOLN Apply 1 drop to eye daily.  . potassium chloride (K-DUR) 10 MEQ tablet TAKE ONE (1) TABLET EACH DAY  . predniSONE (DELTASONE) 5 MG tablet Take 1 tablet (5 mg total) by mouth daily.  . Probiotic Product (PROBIOTIC DAILY PO) Take 1 capsule by mouth daily.  . traMADol (ULTRAM) 50 MG tablet Take 1 tablet (50 mg total) by mouth every 6 (six) hours as needed for pain.  Marland Kitchen warfarin (COUMADIN) 4 MG tablet Take 2-4 mg by mouth daily. Take 1 tablet Monday, Wednesday and Friday and 1/2 tablet all other days  . XOPENEX HFA 45 MCG/ACT inhaler USE 2 PUFFS TWICE DAILY  Review of Systems  Constitutional: Negative.   HENT: Negative.   Eyes: Negative.   Respiratory: Negative.   Cardiovascular: Negative.   Gastrointestinal: Negative.   Endocrine: Negative.   Musculoskeletal: Negative.   Skin: Positive for wound (recheck arm wounds).  Allergic/Immunologic: Negative.   Neurological: Negative.   Hematological: Negative.   Psychiatric/Behavioral: Negative.  Confusion: memory deficit.       Objective:   Physical Exam BP 131/62  Pulse 56  Temp(Src) 97.3 F (36.3 C) (Oral)  Ht 5' 4"  (1.626 m)  Wt 190 lb (86.183 kg)  BMI 32.60 kg/m2 Mini mental status  exam done today during the visit 29/30 In addition to the wound on the left arm that occurred from a fall she has a new wound on the right arm which is also being addressed by her caregiver at home. Both of these wounds appear to be healing. They were cleaned and redressed at the office visit today. The patient was alert and talkative and is feeling better secondary to medication that was decreased by the cardiologist. Hopefully this will result in fewer falls in the future.      Assessment & Plan:  1. GERD - POCT CBC  2. Hyperlipidemia - POCT CBC - NMR, lipoprofile  3. Hypertension - POCT CBC - BMP8+EGFR - Hepatic function panel  4. Vitamin D deficiency - Vit D  25 hydroxy (rtn osteoporosis monitoring)  5. A-fib - POCT INR  6. Other malaise and fatigue - Thyroid Panel With TSH  7. Hypothyroidism - Thyroid Panel With TSH  8. Memory disorder -MMSE  9. Wound, open -These wounds were cleansed and redressed at the visit  Patient Instructions  Continue to be careful and do not put yourself at risk for falling Drink plenty of fluids Take medication as directed Followup with a new health care program as directed and let us know if there's anything else we need to do to help you with this program    Arrie Senate MD

## 2013-10-16 NOTE — Telephone Encounter (Signed)
Pt already here for appt

## 2013-10-16 NOTE — Progress Notes (Deleted)
MMSE to be done today at this visit. Patient scored __29___ out of 30.

## 2013-10-18 LAB — NMR, LIPOPROFILE
Cholesterol: 175 mg/dL (ref <200)
HDL Cholesterol by NMR: 69 mg/dL (ref >=40)
HDL Particle Number: 34.8 umol/L (ref >=30.5)
LDL Particle Number: 1024 nmol/L — ABNORMAL HIGH (ref <1000)
LDL SIZE: 21.3 nm (ref >20.5)
LDLC SERPL CALC-MCNC: 90 mg/dL (ref <100)
LP-IR Score: <25 (ref <=45)
SMALL LDL PARTICLE NUMBER: 100 nmol/L (ref <=527)
Triglycerides by NMR: 81 mg/dL (ref <150)

## 2013-10-18 LAB — HEPATIC FUNCTION PANEL
ALK PHOS: 66 IU/L (ref 39–117)
ALT: 13 IU/L (ref 0–32)
AST: 17 IU/L (ref 0–40)
Albumin: 4.3 g/dL (ref 3.5–4.8)
BILIRUBIN DIRECT: 0.11 mg/dL (ref 0.00–0.40)
BILIRUBIN TOTAL: 0.4 mg/dL (ref 0.0–1.2)
Total Protein: 6.1 g/dL (ref 6.0–8.5)

## 2013-10-18 LAB — THYROID PANEL WITH TSH
FREE THYROXINE INDEX: 3.3 (ref 1.2–4.9)
T3 Uptake Ratio: 34 % (ref 24–39)
T4, Total: 9.7 ug/dL (ref 4.5–12.0)
TSH: 3.3 u[IU]/mL (ref 0.450–4.500)

## 2013-10-18 LAB — BMP8+EGFR
BUN/Creatinine Ratio: 12 (ref 11–26)
BUN: 9 mg/dL (ref 8–27)
CALCIUM: 9.3 mg/dL (ref 8.7–10.3)
CO2: 22 mmol/L (ref 18–29)
CREATININE: 0.75 mg/dL (ref 0.57–1.00)
Chloride: 105 mmol/L (ref 97–108)
GFR, EST AFRICAN AMERICAN: 91 mL/min/{1.73_m2} (ref >59)
GFR, EST NON AFRICAN AMERICAN: 79 mL/min/{1.73_m2} (ref >59)
Glucose: 80 mg/dL (ref 65–99)
Potassium: 3.9 mmol/L (ref 3.5–5.2)
Sodium: 146 mmol/L — ABNORMAL HIGH (ref 134–144)

## 2013-10-18 LAB — VITAMIN D 25 HYDROXY (VIT D DEFICIENCY, FRACTURES): Vit D, 25-Hydroxy: 43.5 ng/mL (ref 30.0–100.0)

## 2013-10-23 DIAGNOSIS — I4891 Unspecified atrial fibrillation: Secondary | ICD-10-CM

## 2013-10-23 DIAGNOSIS — M069 Rheumatoid arthritis, unspecified: Secondary | ICD-10-CM

## 2013-10-23 DIAGNOSIS — M79609 Pain in unspecified limb: Secondary | ICD-10-CM

## 2013-10-23 DIAGNOSIS — M549 Dorsalgia, unspecified: Secondary | ICD-10-CM

## 2013-10-23 DIAGNOSIS — IMO0001 Reserved for inherently not codable concepts without codable children: Secondary | ICD-10-CM

## 2013-10-23 DIAGNOSIS — G8929 Other chronic pain: Secondary | ICD-10-CM

## 2013-10-23 DIAGNOSIS — J45909 Unspecified asthma, uncomplicated: Secondary | ICD-10-CM

## 2013-11-09 ENCOUNTER — Other Ambulatory Visit: Payer: Self-pay | Admitting: Family Medicine

## 2013-11-12 NOTE — Telephone Encounter (Signed)
Called in.

## 2013-11-12 NOTE — Telephone Encounter (Signed)
lorazepam is ok to refill

## 2013-11-12 NOTE — Telephone Encounter (Signed)
Last ov 10/16/13. Last refill 10/11/13 on Ativan.  Call in [facility] if approved. Route to pool.

## 2013-11-14 ENCOUNTER — Encounter: Payer: Self-pay | Admitting: Family Medicine

## 2013-11-14 ENCOUNTER — Ambulatory Visit (INDEPENDENT_AMBULATORY_CARE_PROVIDER_SITE_OTHER): Payer: Medicare Other | Admitting: Family Medicine

## 2013-11-14 VITALS — BP 146/74 | HR 62 | Temp 97.7°F | Ht 64.0 in | Wt 185.0 lb

## 2013-11-14 DIAGNOSIS — E559 Vitamin D deficiency, unspecified: Secondary | ICD-10-CM

## 2013-11-14 DIAGNOSIS — E039 Hypothyroidism, unspecified: Secondary | ICD-10-CM

## 2013-11-14 DIAGNOSIS — I10 Essential (primary) hypertension: Secondary | ICD-10-CM

## 2013-11-14 DIAGNOSIS — I4891 Unspecified atrial fibrillation: Secondary | ICD-10-CM

## 2013-11-14 DIAGNOSIS — R413 Other amnesia: Secondary | ICD-10-CM

## 2013-11-14 DIAGNOSIS — E785 Hyperlipidemia, unspecified: Secondary | ICD-10-CM

## 2013-11-14 NOTE — Patient Instructions (Signed)
Continue to be careful and to not put yourself at risk for falling Continue with new program and let us know if we can be of any further help Continue current medication Continue followup visit with cardiologist

## 2013-11-14 NOTE — Progress Notes (Signed)
Subjective:    Patient ID: Stacey Davenport, female    DOB: 1941/02/12, 73 y.o.   MRN: [id number]  HPI Patient here today for 6 week follow up for memory, wounds to arms, and discuss upcoming start with the [facility] program. The patient is doing well today her wounds have healed. She is positive about the new program that she's getting ready to become a part of. We will scan in information about that in the chart here       Patient Active Problem List   Diagnosis Date Noted  . High risk medication use 09/03/2013  . Multiple drug allergies 09/03/2013  . 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 Hypothyroidism 04/23/2013  . Hyperlipidemia 12/06/2012  . CVA History 12/06/2012  . Atrial fibrillation 09/30/2011  . Hypertension 08/20/2009  . Glaucoma 04/17/2009  . ASTHMA 04/17/2009  . ESOPHAGEAL STRICTURE 04/17/2009  . GERD 04/17/2009  . Rheumatoid arthritis 04/17/2009   Outpatient Encounter Prescriptions as of 11/14/2013  Medication Sig  . acetaminophen (TYLENOL) 325 MG tablet Take 650 mg by mouth every 6 (six) hours as needed for pain.  Marland Kitchen amiodarone (PACERONE) 200 MG tablet Take 1 tablet (200 mg total) by mouth daily.  . brimonidine (ALPHAGAN) 0.2 % ophthalmic solution PLACE 1 DROP IN EACH EYE 3 TIMES DAILY  . calcium-vitamin D (OSCAL) 250-125 MG-UNIT per tablet Take 1 tablet by mouth daily.    . cholecalciferol (VITAMIN D) 1000 UNITS tablet Take 2,000 Units by mouth daily.  . ciclesonide (ALVESCO) 160 MCG/ACT inhaler Inhale 1 puff into the lungs 2 (two) times daily.  Marland Kitchen DEXILANT 60 MG capsule TAKE ONE CAPSULE BY MOUTH TWICE A DAY  . diltiazem (CARDIZEM CD) 120 MG 24 hr capsule Take 1 capsule (120 mg total) by mouth 2 (two) times daily.  . furosemide (LASIX) 20 MG tablet Take 20 mg by mouth as needed for fluid or edema.  Marland Kitchen latanoprost (XALATAN) 0.005 % ophthalmic solution PLACE 1 DROP IN EACH EYE AT BEDTIME  . levothyroxine (SYNTHROID, LEVOTHROID) 75 MCG tablet Take 75 mcg by mouth  daily before breakfast. Take once daily  . loratadine (CLARITIN) 10 MG tablet Take 1 tablet (10 mg total) by mouth daily.  Marland Kitchen LORazepam (ATIVAN) 0.5 MG tablet TAKE 1 TO 2 TABLETS EVERY DAY  . montelukast (SINGULAIR) 10 MG tablet TAKE ONE (1) TABLET EACH DAY  . Olopatadine HCl 0.2 % SOLN Apply 1 drop to eye daily.  Marland Kitchen ORGAN-I NR 200 MG tablet TAKE 2 TABLETS EVERY 4 HOURS AS NEEDED FOR CONGESTION  . potassium chloride (K-DUR) 10 MEQ tablet TAKE ONE (1) TABLET EACH DAY  . predniSONE (DELTASONE) 5 MG tablet TAKE ONE (1) TABLET EACH DAY  . traMADol (ULTRAM) 50 MG tablet Take 1 tablet (50 mg total) by mouth every 6 (six) hours as needed for pain.  Marland Kitchen warfarin (COUMADIN) 4 MG tablet Take 2-4 mg by mouth daily. Take 1 tablet Monday, Wednesday and Friday and 1/2 tablet all other days  . XOPENEX HFA 45 MCG/ACT inhaler USE 2 PUFFS TWICE DAILY  . [DISCONTINUED] albuterol (PROVENTIL) (2.5 MG/3ML) 0.083% nebulizer solution Take 3 mLs (2.5 mg total) by nebulization every 6 (six) hours as needed for wheezing.  . [DISCONTINUED] budesonide (PULMICORT) 0.5 MG/2ML nebulizer solution Take 2 mLs (0.5 mg total) by nebulization 2 (two) times daily.  . [DISCONTINUED] fluticasone (FLONASE) 50 MCG/ACT nasal spray Place 1 spray into the nose daily.  . [DISCONTINUED] Probiotic Product (PROBIOTIC DAILY PO) Take 1 capsule by mouth daily.  . [  DISCONTINUED] sodium chloride irrigation 0.9 % irrigation USE AS DIRECTED    Review of Systems  Constitutional: Negative.   HENT: Negative.   Eyes: Negative.   Respiratory: Negative.   Cardiovascular: Negative.   Gastrointestinal: Negative.   Endocrine: Negative.   Genitourinary: Negative.   Musculoskeletal: Negative.   Skin: Negative.   Allergic/Immunologic: Negative.   Neurological: Negative.   Hematological: Negative.   Psychiatric/Behavioral: Negative.        Objective:   Physical Exam  Nursing note and vitals reviewed. Constitutional: She is oriented to person, place,  and time. She appears well-developed and well-nourished. No distress.  Patient comes to the visit today with her rolling walker she is alert and positive in her attitude.  HENT:  Head: Normocephalic and atraumatic.  Right Ear: External ear normal.  Left Ear: External ear normal.  Nose: Nose normal.  Mouth/Throat: Oropharynx is clear and moist.  Eyes: Conjunctivae and EOM are normal. Pupils are equal, round, and reactive to light. Right eye exhibits no discharge. Left eye exhibits no discharge. No scleral icterus.  Neck: Normal range of motion. Neck supple. No thyromegaly present.  Cardiovascular: Normal rate, regular rhythm, normal heart sounds and intact distal pulses.  Exam reveals no gallop and no friction rub.   No murmur heard. At 60 per minute  Pulmonary/Chest: Effort normal and breath sounds normal. No respiratory distress. She has no wheezes. She has no rales. She exhibits no tenderness.  No wheezing no rhonchi  Abdominal: Soft. She exhibits no mass. There is no tenderness. There is no rebound and no guarding.  Musculoskeletal: Normal range of motion. She exhibits no edema and no tenderness.  Lymphadenopathy:    She has no cervical adenopathy.  Neurological: She is alert and oriented to person, place, and time.  Skin: Skin is warm and dry.  Laceration wounds from previous fall are well-healed  Psychiatric: She has a normal mood and affect. Her behavior is normal. Judgment and thought content normal.   BP 146/74  Pulse 62  Temp(Src) 97.7 F (36.5 C) (Oral)  Ht 5\' 4"  (1.626 m)  Wt 185 lb (83.915 kg)  BMI 31.74 kg/m2        Assessment & Plan:   1. Hyperlipidemia -Continue current medication 2. Essential hypertension, benign  3. Vitamin D deficiency  4. A-fib -Normal sinus rhythm today  5. Hypothyroidism  6. Memory disorder -Stable  Patient Instructions  Continue to be careful and to not put yourself at risk for falling Continue with new program and let us  know if we can be of any further help Continue current medication Continue followup visit with cardiologist   Arrie Senate MD

## 2013-11-15 NOTE — Telephone Encounter (Signed)
Encounter Closed--TP 11/15/2013 

## 2013-12-25 ENCOUNTER — Ambulatory Visit (INDEPENDENT_AMBULATORY_CARE_PROVIDER_SITE_OTHER): Payer: Medicare Other | Admitting: Pharmacist

## 2013-12-25 NOTE — Progress Notes (Signed)
[facility]aitent is not followed by [facility] in the home - they are monitoring INR and attending to other medical needs.

## 2014-01-08 ENCOUNTER — Ambulatory Visit: Payer: Medicare Other | Admitting: Family Medicine

## 2014-01-15 ENCOUNTER — Ambulatory Visit: Payer: Medicare Other | Admitting: Family Medicine

## 2014-02-20 ENCOUNTER — Ambulatory Visit (INDEPENDENT_AMBULATORY_CARE_PROVIDER_SITE_OTHER): Payer: Medicare (Managed Care) | Admitting: Cardiology

## 2014-02-20 ENCOUNTER — Encounter: Payer: Self-pay | Admitting: Cardiology

## 2014-02-20 VITALS — BP 128/72 | HR 66 | Ht 64.0 in | Wt 180.0 lb

## 2014-02-20 DIAGNOSIS — I4891 Unspecified atrial fibrillation: Secondary | ICD-10-CM

## 2014-02-20 DIAGNOSIS — I4819 Other persistent atrial fibrillation: Secondary | ICD-10-CM

## 2014-02-20 NOTE — Progress Notes (Signed)
HPI The patient presents for followup of her atrial fibrillation and syncope thought to be orthostasis.  The patient denies any new symptoms such as chest discomfort, neck or arm discomfort. There has been no new shortness of breath, PND or orthopnea.  She has had no further orthostatic events.  She does have occasional fatigue.  She did have one day where she  She had prolonged palpitations but these eventually subsided.    Allergies  Allergen Reactions  . Aciphex [Rabeprazole Sodium]   . Amoxicillin   . Biaxin [Clarithromycin] Hives and Itching  . Cephalexin   . Esomeprazole Magnesium     Takes Dexilant at home  . Hydrocodone   . Hydrocodone-Acetaminophen   . Ketek [Telithromycin]   . Latex   . Levaquin [Levofloxacin In D5w]   . Lipitor [Atorvastatin]   . Macrobid [Nitrofurantoin Macrocrystal]   . Moxifloxacin   . Nexium [Esomeprazole Magnesium]   . Rabeprazole Sodium     Takes Dexilant at home  . Sulfonamide Derivatives   . Vesicare [Solifenacin]   . Zithromax [Azithromycin]     Current Outpatient Prescriptions  Medication Sig Dispense Refill  . acetaminophen (TYLENOL) 325 MG tablet Take 650 mg by mouth every 6 (six) hours as needed for pain.      Marland Kitchen amiodarone (PACERONE) 200 MG tablet Take 1 tablet (200 mg total) by mouth daily.  30 tablet  11  . brimonidine (ALPHAGAN) 0.2 % ophthalmic solution PLACE 1 DROP IN EACH EYE 3 TIMES DAILY  10 mL  2  . calcium-vitamin D (OSCAL) 250-125 MG-UNIT per tablet Take 1 tablet by mouth daily.        . cholecalciferol (VITAMIN D) 1000 UNITS tablet Take 2,000 Units by mouth daily.      . ciclesonide (ALVESCO) 160 MCG/ACT inhaler Inhale 1 puff into the lungs 2 (two) times daily.  1 Inhaler  2  . diltiazem (CARDIZEM CD) 120 MG 24 hr capsule Take 1 capsule (120 mg total) by mouth 2 (two) times daily.  60 capsule  2  . furosemide (LASIX) 20 MG tablet Take 20 mg by mouth as needed for fluid or edema.      Marland Kitchen latanoprost (XALATAN) 0.005 %  ophthalmic solution PLACE 1 DROP IN EACH EYE AT BEDTIME  2.5 mL  2  . levothyroxine (SYNTHROID, LEVOTHROID) 75 MCG tablet Take 75 mcg by mouth daily before breakfast. Take once daily      . loratadine (CLARITIN) 10 MG tablet Take 1 tablet (10 mg total) by mouth daily.  30 tablet  11  . LORazepam (ATIVAN) 0.5 MG tablet TAKE 1 TO 2 TABLETS EVERY DAY  60 tablet  1  . montelukast (SINGULAIR) 10 MG tablet TAKE ONE (1) TABLET EACH DAY  30 tablet  3  . omeprazole (PRILOSEC) 40 MG capsule Take 40 mg by mouth daily.      Marland Kitchen ORGAN-I NR 200 MG tablet TAKE 2 TABLETS EVERY 4 HOURS AS NEEDED FOR CONGESTION  60 tablet  0  . potassium chloride (K-DUR) 10 MEQ tablet TAKE ONE (1) TABLET EACH DAY  30 tablet  3  . predniSONE (DELTASONE) 5 MG tablet TAKE ONE (1) TABLET EACH DAY  30 tablet  0  . warfarin (COUMADIN) 4 MG tablet Take 2-4 mg by mouth daily. Take 1 tablet Monday, Wednesday and Friday and 1/2 tablet all other days      . XOPENEX HFA 45 MCG/ACT inhaler USE 2 PUFFS TWICE DAILY  15 g  2  .  Olopatadine HCl 0.2 % SOLN Apply 1 drop to eye daily.      . traMADol (ULTRAM) 50 MG tablet Take 1 tablet (50 mg total) by mouth every 6 (six) hours as needed for pain.  15 tablet  0  . [DISCONTINUED] potassium chloride (K-DUR,KLOR-CON) 10 MEQ tablet Take 1 tablet (10 mEq total) by mouth daily.  30 tablet  11   No current facility-administered medications for this visit.    Past Medical History  Diagnosis Date  . Esophageal stricture   . Gastritis   . GERD (gastroesophageal reflux disease)   . Osteoarthritis   . Glaucoma   . Rheumatoid arthritis(714.0)   . Asthma   . Dysrhythmia     hx of irregular heart beat  . Shortness of breath   . Hypothyroidism   . Hypertension   . Pneumonia   . 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 Atrial fibrillation     Past Surgical History  Procedure Laterality Date  . Tubal ligation    . Appendectomy    .  Right eye lid surgery    . Bilateral cataract extraction    . Cholecystectomy      ROS:  As stated in the HPI and negative for all other systems.  PHYSICAL EXAM BP 128/72  Pulse 66  Ht 5\' 4"  (1.626 m)  Wt 180 lb (81.647 kg)  BMI 30.88 kg/m2 GENERAL:  Well appearing NECK:  No jugular venous distention, waveform within normal limits, carotid upstroke brisk and symmetric, no bruits, no thyromegaly LUNGS:  Clear to auscultation bilaterally CHEST:  Unremarkable HEART:  PMI not displaced or sustained,S1 and S2 within normal limits, no S3, no S4, no clicks, no rubs, no murmurs, regular ABD:  Flat, positive bowel sounds normal in frequency in pitch, no bruits, no rebound, no guarding, no midline pulsatile mass, no hepatomegaly, no splenomegaly EXT:  2 plus pulses throughout, no edema, no cyanosis no clubbing SKIN:  Skin tears healed  EKG:   Sinus rhythm, rate 59, first degree AV block , left atrial enlargement, poor anterior R wave progression , right axis deviation , no acute ST-T wave changes. 02/20/2014  ASSESSMENT AND PLAN  Atrial fibrillation -  She is not up-to-date with amiodarone followup. She tolerates warfarin.  She will continue the meds as listed.  Essential hypertension, benign -  Her BP is labile but overall well controlled  Again today, I reviewed the BP diary.   I will make no change to the meds.

## 2014-02-20 NOTE — Patient Instructions (Signed)
The current medical regimen is effective;  continue present plan and medications.  Follow up in 1 year with Dr Hochrein.  You will receive a letter in the mail 2 months before you are due.  Please call us when you receive this letter to schedule your follow up appointment.  

## 2014-03-26 ENCOUNTER — Other Ambulatory Visit: Payer: Self-pay | Admitting: Family Medicine

## 2014-03-27 NOTE — Telephone Encounter (Signed)
Last ov 11/14/13

## 2014-07-16 ENCOUNTER — Other Ambulatory Visit: Payer: Self-pay | Admitting: *Deleted

## 2014-10-01 ENCOUNTER — Other Ambulatory Visit: Payer: Self-pay | Admitting: Family Medicine

## 2014-10-01 DIAGNOSIS — Z1231 Encounter for screening mammogram for malignant neoplasm of breast: Secondary | ICD-10-CM

## 2014-10-18 ENCOUNTER — Ambulatory Visit
Admission: RE | Admit: 2014-10-18 | Discharge: 2014-10-18 | Disposition: A | Discharge status: Home / Self Care | Payer: Medicare (Managed Care) | Source: Ambulatory Visit | Attending: Family Medicine | Admitting: Family Medicine

## 2014-10-18 DIAGNOSIS — Z1231 Encounter for screening mammogram for malignant neoplasm of breast: Secondary | ICD-10-CM

## 2015-04-30 ENCOUNTER — Encounter: Payer: Self-pay | Admitting: Cardiology

## 2015-04-30 ENCOUNTER — Ambulatory Visit (INDEPENDENT_AMBULATORY_CARE_PROVIDER_SITE_OTHER): Payer: Medicare (Managed Care) | Admitting: Cardiology

## 2015-04-30 VITALS — BP 148/80 | HR 66 | Ht 65.0 in | Wt 196.0 lb

## 2015-04-30 DIAGNOSIS — I10 Essential (primary) hypertension: Secondary | ICD-10-CM | POA: Diagnosis not present

## 2015-04-30 NOTE — Progress Notes (Signed)
HPI The patient presents for followup of her atrial fibrillation.    The patient denies any new symptoms such as chest discomfort, neck or arm discomfort. There has been no new shortness of breath, PND or orthopnea.  The patient has had some labile blood pressures. She was on beta blockers briefly but this made her too fatigued. She subsequently came upon a combination of Cardizem 120 mg during the day and 180 mg at night seems to control her blood pressure symptoms. She's not having any symptomatic tachypalpitations. She's not had any presyncope or syncope such as she had past and no new orthostatic symptoms. She gets around with a rolling walker.  Allergies  Allergen Reactions  . Aciphex [Rabeprazole Sodium]   . Amoxicillin   . Biaxin [Clarithromycin] Hives and Itching  . Cephalexin   . Esomeprazole Magnesium     Takes Dexilant at home  . Hydrocodone   . Hydrocodone-Acetaminophen   . Ketek [Telithromycin]   . Latex   . Levaquin [Levofloxacin In D5w]   . Lipitor [Atorvastatin]   . Macrobid [Nitrofurantoin Macrocrystal]   . Moxifloxacin   . Nexium [Esomeprazole Magnesium]   . Rabeprazole Sodium     Takes Dexilant at home  . Sulfonamide Derivatives   . Vesicare [Solifenacin]   . Zithromax [Azithromycin]     Current Outpatient Prescriptions  Medication Sig Dispense Refill  . acetaminophen (TYLENOL) 325 MG tablet Take 650 mg by mouth every 6 (six) hours as needed for pain.    Marland Kitchen amiodarone (PACERONE) 200 MG tablet Take 1 tablet (200 mg total) by mouth daily. 30 tablet 11  . brimonidine (ALPHAGAN) 0.2 % ophthalmic solution PLACE 1 DROP IN EACH EYE 3 TIMES DAILY 10 mL 2  . calcium-vitamin D (OSCAL) 250-125 MG-UNIT per tablet Take 1 tablet by mouth daily.      . cholecalciferol (VITAMIN D) 1000 UNITS tablet Take 2,000 Units by mouth daily.    . ciclesonide (ALVESCO) 160 MCG/ACT inhaler Inhale 1 puff into the lungs 2 (two) times daily. 1 Inhaler 2  . furosemide (LASIX) 20 MG tablet  Take 20 mg by mouth as needed for fluid or edema.    Marland Kitchen latanoprost (XALATAN) 0.005 % ophthalmic solution PLACE 1 DROP IN EACH EYE AT BEDTIME 2.5 mL 2  . levothyroxine (SYNTHROID, LEVOTHROID) 75 MCG tablet Take 75 mcg by mouth daily before breakfast. Take once daily    . loratadine (CLARITIN) 10 MG tablet Take 1 tablet (10 mg total) by mouth daily. 30 tablet 11  . LORazepam (ATIVAN) 0.5 MG tablet TAKE 1 TO 2 TABLETS EVERY DAY 60 tablet 1  . montelukast (SINGULAIR) 10 MG tablet TAKE ONE (1) TABLET EACH DAY 30 tablet 3  . Olopatadine HCl 0.2 % SOLN Apply 1 drop to eye daily.    Marland Kitchen omeprazole (PRILOSEC) 40 MG capsule Take 40 mg by mouth 2 (two) times daily.     . predniSONE (DELTASONE) 5 MG tablet TAKE ONE (1) TABLET EACH DAY 30 tablet 0  . XOPENEX HFA 45 MCG/ACT inhaler USE 2 PUFFS TWICE DAILY 15 g 2  . diltiazem (CARDIZEM CD) 120 MG 24 hr capsule Take 1 capsule (120 mg total) by mouth 2 (two) times daily. 60 capsule 2  . ORGAN-I NR 200 MG tablet TAKE 2 TABLETS EVERY 4 HOURS AS NEEDED FOR CONGESTION 60 tablet 0  . potassium chloride (K-DUR) 10 MEQ tablet TAKE ONE (1) TABLET EACH DAY (Patient not taking: Reported on 04/30/2015) 30 tablet 3  .  traMADol (ULTRAM) 50 MG tablet Take 1 tablet (50 mg total) by mouth every 6 (six) hours as needed for pain. (Patient not taking: Reported on 04/30/2015) 15 tablet 0  . warfarin (COUMADIN) 4 MG tablet Take 2-4 mg by mouth daily. Take 1 tablet Monday, Wednesday and Friday and 1/2 tablet all other days    . [DISCONTINUED] potassium chloride (K-DUR,KLOR-CON) 10 MEQ tablet Take 1 tablet (10 mEq total) by mouth daily. 30 tablet 11   No current facility-administered medications for this visit.    Past Medical History  Diagnosis Date  . Esophageal stricture   . Gastritis   . GERD (gastroesophageal reflux disease)   . Osteoarthritis   . Glaucoma   . Rheumatoid arthritis(714.0)   . Asthma   . Dysrhythmia     hx of irregular heart beat  . Hypothyroidism   .  Hypertension   . Pneumonia   . 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 Atrial fibrillation [facility])     Past Surgical History  Procedure Laterality Date  . Tubal ligation    . Appendectomy    . Right eye lid surgery    . Bilateral cataract extraction    . Cholecystectomy      ROS:  As stated in the HPI and negative for all other systems.  PHYSICAL EXAM BP 148/80 mmHg  Pulse 66  Ht 5\' 5"  (1.651 m)  Wt 196 lb (88.905 kg)  BMI 32.62 kg/m2 GENERAL:  Well appearing NECK:  No jugular venous distention, waveform within normal limits, carotid upstroke brisk and symmetric, no bruits, no thyromegaly LUNGS:  Clear to auscultation bilaterally CHEST:  Unremarkable HEART:  PMI not displaced or sustained,S1 and S2 within normal limits, no S3, no S4, no clicks, no rubs, no murmurs, regular ABD:  Flat, positive bowel sounds normal in frequency in pitch, no bruits, no rebound, no guarding, no midline pulsatile mass, no hepatomegaly, no splenomegaly EXT:  2 plus pulses throughout, no edema, no cyanosis no clubbing SKIN:  Skin tears healed  EKG:   Sinus rhythm, rate 66, first degree AV block , left atrial enlargement, poor anterior R wave progression , right axis deviation , no acute ST-T wave changes. 04/30/2015  ASSESSMENT AND PLAN  Atrial fibrillation -  She has not had symptomatic paroxysms. She is tolerating anticoagulation and probably for the most part maintaining sinus rhythm with amiodarone. I will check with Sherian Maroon, MD to make sure that they haven't been checking TSH and liver profile. She will remain on the meds as listed.   Essential hypertension, benign -  Her BP is labile but overall well controlled    I will make no change to the meds.

## 2015-04-30 NOTE — Patient Instructions (Signed)
Medication Instructions:  The current medical regimen is effective;  continue present plan and medications.  Follow-Up: Follow up in 1 year with Dr. Hochrein.  You will receive a letter in the mail 2 months before you are due.  Please call us when you receive this letter to schedule your follow up appointment.  Thank you for choosing [facility]!!      

## 2015-05-12 ENCOUNTER — Other Ambulatory Visit: Payer: Self-pay | Admitting: Family Medicine

## 2015-05-12 ENCOUNTER — Ambulatory Visit
Admission: RE | Admit: 2015-05-12 | Discharge: 2015-05-12 | Disposition: A | Discharge status: Home / Self Care | Payer: No Typology Code available for payment source | Source: Ambulatory Visit | Attending: Family Medicine | Admitting: Family Medicine

## 2015-05-12 DIAGNOSIS — T462X5A Adverse effect of other antidysrhythmic drugs, initial encounter: Principal | ICD-10-CM

## 2015-05-12 DIAGNOSIS — J984 Other disorders of lung: Secondary | ICD-10-CM

## 2015-05-14 ENCOUNTER — Telehealth: Payer: Self-pay | Admitting: Family Medicine

## 2015-05-14 MED ORDER — PREDNISONE 5 MG PO TABS: ORAL_TABLET | ORAL | Status: DC

## 2015-05-14 NOTE — Telephone Encounter (Signed)
Patient needed a refill on her prednisone and rx was sent to pharmacy.

## 2015-06-10 ENCOUNTER — Other Ambulatory Visit: Payer: Self-pay | Admitting: Family Medicine

## 2015-06-13 ENCOUNTER — Encounter: Payer: Self-pay | Admitting: Physician Assistant

## 2015-07-02 ENCOUNTER — Ambulatory Visit (INDEPENDENT_AMBULATORY_CARE_PROVIDER_SITE_OTHER): Payer: Medicare (Managed Care) | Admitting: Physician Assistant

## 2015-07-02 ENCOUNTER — Telehealth: Payer: Self-pay

## 2015-07-02 ENCOUNTER — Encounter: Payer: Self-pay | Admitting: Physician Assistant

## 2015-07-02 VITALS — BP 152/72 | HR 64 | Ht 64.0 in | Wt 193.8 lb

## 2015-07-02 DIAGNOSIS — R1310 Dysphagia, unspecified: Secondary | ICD-10-CM | POA: Diagnosis not present

## 2015-07-02 DIAGNOSIS — Z7901 Long term (current) use of anticoagulants: Secondary | ICD-10-CM

## 2015-07-02 DIAGNOSIS — K219 Gastro-esophageal reflux disease without esophagitis: Secondary | ICD-10-CM

## 2015-07-02 DIAGNOSIS — I480 Paroxysmal atrial fibrillation: Secondary | ICD-10-CM | POA: Diagnosis not present

## 2015-07-02 MED ORDER — ONDANSETRON HCL 4 MG PO TABS: 4.0000 mg | ORAL_TABLET | Freq: Four times a day (QID) | ORAL | Status: DC | PRN

## 2015-07-02 NOTE — Patient Instructions (Signed)
You have been scheduled for a colonoscopy. Please follow written instructions given to you at your visit today.  Please pick up your prep supplies at the pharmacy within the next 1-3 days. If you use inhalers (even only as needed), please bring them with you on the day of your procedure. Your physician has requested that you go to www.startemmi.com and enter the access code given to you at your visit today. This web site gives a general overview about your procedure. However, you should still follow specific instructions given to you by our office regarding your preparation for the procedure.  We have sent the following medications to your pharmacy for you to pick up at your convenience: Zofran  Cut food into small pieces and chew thoroughly.   We will contact you once we have talked to your cardiologist about your blood thinner.

## 2015-07-02 NOTE — Progress Notes (Signed)
Reviewed and agree with management plan.  Nataly Pacifico T. Richell Corker, MD FACG 

## 2015-07-02 NOTE — Telephone Encounter (Signed)
[id number]   RE: Stacey Davenport DOB: [id number] MRN: [id number]   Dear Dr. Percival Spanish,    We have scheduled the above patient for an endoscopic procedure. Our records show that she is on anticoagulation therapy.   Please advise as to how long the patient may come off her therapy of Warfin prior to the procedure, which is scheduled for 07-18-2015.  Please fax back/ or route the completed form to [facility], [place] at [phone].   Sincerely,    Elias Else

## 2015-07-02 NOTE — Progress Notes (Signed)
Patient ID: Stacey Davenport, female   DOB: [birth year], 74 y.o.   MRN: [id number]    HPI:  Stacey Davenport is a 74 y.o.   female referred by Janifer Adie, MD for evaluation of dysphagia. Stacey Davenport has been seen by Dr. Verl Blalock in the past. Her last upper endoscopy was on 04/23/2009. She was noted to have normal duodenal folds. Multiple hyperplastic nodules were noted. A hiatal hernia was noted. There was a stricture at the GE junction which was dilated with a #50 Maloney dilator. She was also noted to have a sessile polyp in the body and antrum of the stomach. Biopsies were taken and it was hyperplastic tissue. She has a history of GERD. He has a history of atrial fibrillation and has been maintained on warfarin. So has hyperlipidemia, hypertension, vitamin D deficiency, hypothyroidism, memory disorder, anxiety, and is had a CVA in 2013. She has rheumatoid arthritis and asthma as well.  She presents today with complaints of recurrent dysphagia of one years duration that has been becoming more severe over the past several months. Her dysphagia tends to occur with meats and dry foods like bread. She has not had to spit food up. She has no dysphagia to liquids. She denies a globus sensation. She does have intermittent nausea that occurs sometimes when she eats. She denies early satiety. Her bowel movements have been regular. She denies bright red blood per rectum or melena.   Past Medical History  Diagnosis Date  . Esophageal stricture   . Gastritis   . GERD (gastroesophageal reflux disease)   . Osteoarthritis   . Glaucoma   . Rheumatoid arthritis(714.0)   . Asthma   . Hypothyroidism   . Hypertension   . Pneumonia   . 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 Atrial fibrillation ([place])   . Anxiety   . Thyroid disease     Past Surgical History  Procedure Laterality Date  . Tubal ligation    . Appendectomy    .  Blepharoplasty Right   . Cataract extraction Bilateral   . Cholecystectomy     Family History  Problem Relation Age of Onset  . Colon cancer Neg Hx   . Diabetes Mother   . Diabetes Sister   . Heart disease Sister   . Esophageal cancer Neg Hx   . Cancer Father     lymph nodes???   Social History  Substance Use Topics  . Smoking status: Never Smoker   . Smokeless tobacco: Never Used  . Alcohol Use: No   Current Outpatient Prescriptions  Medication Sig Dispense Refill  . acetaminophen (TYLENOL) 325 MG tablet Take 650 mg by mouth every 6 (six) hours as needed for pain.    Marland Kitchen amiodarone (PACERONE) 200 MG tablet Take 1 tablet (200 mg total) by mouth daily. 30 tablet 11  . brimonidine (ALPHAGAN) 0.2 % ophthalmic solution USE 1 DROP IN EACH EYE 3 TIMES DAILY 10 mL 0  . calcium-vitamin D (OSCAL) 250-125 MG-UNIT per tablet Take 1 tablet by mouth daily.      . cholecalciferol (VITAMIN D) 1000 UNITS tablet Take 2,000 Units by mouth daily.    . ciclesonide (ALVESCO) 160 MCG/ACT inhaler Inhale 1 puff into the lungs 2 (two) times daily. (Patient taking differently: Inhale 2 puffs into the lungs 2 (two) times daily. ) 1 Inhaler 2  . diltiazem (TIAZAC) 180 MG 24 hr capsule Take  180 mg by mouth daily. Take one tablet by mouth in the evening    . fluticasone (FLONASE) 50 MCG/ACT nasal spray USE 1 SPRAY IN EACH NOSTRIL ONCE DAILY (Patient taking differently: USE 1 SPRAY IN EACH NOSTRIL ONCE DAILY as needed) 16 g 0  . furosemide (LASIX) 20 MG tablet Take 20 mg by mouth as needed for fluid or edema.    Marland Kitchen latanoprost (XALATAN) 0.005 % ophthalmic solution PLACE 1 DROP IN EACH EYE AT BEDTIME 2.5 mL 0  . levalbuterol (XOPENEX) 0.63 MG/3ML nebulizer solution Take 0.63 mg by nebulization every 8 (eight) hours as needed for wheezing or shortness of breath.    . levothyroxine (SYNTHROID, LEVOTHROID) 75 MCG tablet Take 75 mcg by mouth daily before breakfast. Take once daily    . loratadine (CLARITIN) 10 MG tablet  Take 1 tablet (10 mg total) by mouth daily. (Patient taking differently: Take 10 mg by mouth daily as needed. ) 30 tablet 11  . LORazepam (ATIVAN) 0.5 MG tablet TAKE 1 TO 2 TABLETS EVERY DAY 60 tablet 1  . magnesium hydroxide (MILK OF MAGNESIA) 400 MG/5ML suspension Take 15 mLs by mouth at bedtime as needed for mild constipation.    . montelukast (SINGULAIR) 10 MG tablet TAKE ONE (1) TABLET EACH DAY 30 tablet 3  . Olopatadine HCl 0.2 % SOLN Apply 1 drop to eye daily.    Marland Kitchen omeprazole (PRILOSEC) 40 MG capsule Take 40 mg by mouth 2 (two) times daily.     . predniSONE (DELTASONE) 5 MG tablet TAKE ONE (1) TABLET EACH DAY 10 tablet 0  . warfarin (COUMADIN) 4 MG tablet Take 2 mg by mouth as directed. Take 4 mg Monday, Thursday. Take 2 mg Sunday, Tuesday, Wednesday, Friday and Saturday    . XOPENEX HFA 45 MCG/ACT inhaler USE 2 PUFFS TWICE DAILY 15 g 2  . ondansetron (ZOFRAN) 4 MG tablet Take 1 tablet (4 mg total) by mouth every 6 (six) hours as needed for nausea or vomiting. 30 tablet 1  . [DISCONTINUED] potassium chloride (K-DUR,KLOR-CON) 10 MEQ tablet Take 1 tablet (10 mEq total) by mouth daily. 30 tablet 11   No current facility-administered medications for this visit.   Allergies  Allergen Reactions  . Aciphex [Rabeprazole Sodium]   . Amoxicillin   . Biaxin [Clarithromycin] Hives and Itching  . Cephalexin   . Esomeprazole Magnesium     Takes Dexilant at home  . Hydrocodone   . Hydrocodone-Acetaminophen   . Ketek [Telithromycin]   . Latex   . Levaquin [Levofloxacin In D5w]   . Lipitor [Atorvastatin]   . Macrobid [Nitrofurantoin Macrocrystal]   . Moxifloxacin   . Nexium [Esomeprazole Magnesium]   . Rabeprazole Sodium     Takes Dexilant at home  . Sulfonamide Derivatives   . Vesicare [Solifenacin]   . Zithromax [Azithromycin]      Review of Systems: Per history of present illness otherwise negative.   Prior Endoscopies:  See history of present illness  Physical Exam: BP 152/72  mmHg  Pulse 64  Ht 5\' 4"  (1.626 m)  Wt 193 lb 12.8 oz (87.907 kg)  BMI 33.25 kg/m2 Constitutional: Pleasant,well-developed, Caucasian female in no acute distress. HEENT: Normocephalic and atraumatic. Conjunctivae are normal. No scleral icterus. Neck supple.  Cardiovascular: Normal rate, regular rhythm.  Pulmonary/chest: Effort normal and breath sounds normal. No wheezing, rales or rhonchi. Abdominal: Soft, nondistended, nontender. Bowel sounds active throughout. There are no masses palpable. No hepatomegaly. Extremities: no edema Lymphadenopathy: No cervical adenopathy  noted. Neurological: Alert and oriented to person place and time. Skin: Skin is warm and dry. No rashes noted. Psychiatric: Normal mood and affect. Behavior is normal.  ASSESSMENT AND PLAN: 75 year old female with a long-standing history of GERD and a prior history of esophageal stricture, presenting with a one-year history of recurrent dysphagia that is regressing in severity. She has been instructed to cut her food into very small pieces and chew thoroughly. She will be scheduled for an EGD with possible dilation.The risks, benefits, and alternatives to endoscopy with possible biopsy and possible dilation were discussed with the patient and they consent to proceed.  Will hold warfarin  5days prior to endoscopic procedures - will instruct when and how to resume after procedure. Benefits and risks of procedure explained including risks of bleeding, perforation, infection, missed lesions, reactions to medications and possible need for hospitalization and surgery for complications. Additional rare but real risk of stroke or other vascular clotting events off warfarin also explained and need to seek urgent help if any signs of these problems occur. Will communicate by phone or EMR with patient's  prescribing provider to confirm that holding warfarin is reasonable in this case.     Ranen Doolin, Stacey Barley PA-C 07/02/2015, 1:42 PM  CC:  Janifer Adie, MD

## 2015-07-04 NOTE — Telephone Encounter (Signed)
OK to hold warfarin for five days prior to the procedure.

## 2015-07-04 NOTE — Telephone Encounter (Signed)
Pt notified and aware to hold her coumadin 5 days prior. She states understanding.

## 2015-07-18 ENCOUNTER — Encounter: Payer: No Typology Code available for payment source | Admitting: Gastroenterology

## 2015-08-06 ENCOUNTER — Encounter: Payer: Self-pay | Admitting: Gastroenterology

## 2015-08-06 ENCOUNTER — Ambulatory Visit (AMBULATORY_SURGERY_CENTER): Payer: Medicare (Managed Care) | Admitting: Gastroenterology

## 2015-08-06 VITALS — BP 148/56 | HR 57 | Temp 96.4°F | Resp 12 | Ht 64.0 in | Wt 193.0 lb

## 2015-08-06 DIAGNOSIS — D131 Benign neoplasm of stomach: Secondary | ICD-10-CM

## 2015-08-06 DIAGNOSIS — R1310 Dysphagia, unspecified: Secondary | ICD-10-CM | POA: Diagnosis present

## 2015-08-06 MED ORDER — SODIUM CHLORIDE 0.9 % IV SOLN: 500.0000 mL | INTRAVENOUS | Status: DC

## 2015-08-06 NOTE — Progress Notes (Signed)
Patient awakening,vss,report to rn 

## 2015-08-06 NOTE — Progress Notes (Signed)
Called to room to assist during endoscopic procedure.  Patient ID and intended procedure confirmed with present staff. Received instructions for my participation in the procedure from the performing physician.  

## 2015-08-06 NOTE — Patient Instructions (Signed)
Discharge instructions given. No ASA/NSAIDS for 2 weeks. Resume Coumadin in 2 days. Office visit in 1 month office will call to schedule. Handout on a hiatal hernia. Handout on clip card given in recovery. YOU HAD AN ENDOSCOPIC PROCEDURE TODAY AT [facility]:   Refer to the procedure report that was given to you for any specific questions about what was found during the examination.  If the procedure report does not answer your questions, please call your gastroenterologist to clarify.  If you requested that your care partner not be given the details of your procedure findings, then the procedure report has been included in a sealed envelope for you to review at your convenience later.  YOU SHOULD EXPECT: Some feelings of bloating in the abdomen. Passage of more gas than usual.  Walking can help get rid of the air that was put into your GI tract during the procedure and reduce the bloating. If you had a lower endoscopy (such as a colonoscopy or flexible sigmoidoscopy) you may notice spotting of blood in your stool or on the toilet paper. If you underwent a bowel prep for your procedure, you may not have a normal bowel movement for a few days.  Please Note:  You might notice some irritation and congestion in your nose or some drainage.  This is from the oxygen used during your procedure.  There is no need for concern and it should clear up in a day or so.  SYMPTOMS TO REPORT IMMEDIATELY:    Following upper endoscopy (EGD)  Vomiting of blood or coffee ground material  New chest pain or pain under the shoulder blades  Painful or persistently difficult swallowing  New shortness of breath  Fever of 100F or higher  Black, tarry-looking stools  For urgent or emergent issues, a gastroenterologist can be reached at any hour by calling [phone].   DIET: Your first meal following the procedure should be a small meal and then it is ok to progress to your normal diet. Heavy or  fried foods are harder to digest and may make you feel nauseous or bloated.  Likewise, meals heavy in dairy and vegetables can increase bloating.  Drink plenty of fluids but you should avoid alcoholic beverages for 24 hours.  ACTIVITY:  You should plan to take it easy for the rest of today and you should NOT DRIVE or use heavy machinery until tomorrow (because of the sedation medicines used during the test).    FOLLOW UP: Our staff will call the number listed on your records the next business day following your procedure to check on you and address any questions or concerns that you may have regarding the information given to you following your procedure. If we do not reach you, we will leave a message.  However, if you are feeling well and you are not experiencing any problems, there is no need to return our call.  We will assume that you have returned to your regular daily activities without incident.  If any biopsies were taken you will be contacted by phone or by letter within the next 1-3 weeks.  Please call us at [phone] if you have not heard about the biopsies in 3 weeks.    SIGNATURES/CONFIDENTIALITY: You and/or your care partner have signed paperwork which will be entered into your electronic medical record.  These signatures attest to the fact that that the information above on your After Visit Summary has been reviewed and is understood.  Full responsibility of the confidentiality of this discharge information lies with you and/or your care-partner.

## 2015-08-06 NOTE — Op Note (Signed)
[place]  [address] [place], [id number]   ENDOSCOPY PROCEDURE REPORT  PATIENT: Stacey Davenport, Stacey Davenport  MR#: [id number] BIRTHDATE: 02-21-1941 , 48  yrs. old GENDER: female ENDOSCOPIST: Ladene Artist, MD, [facility] REFERRED BY:  Barney Drain, M.D. PROCEDURE DATE:  08/06/2015 PROCEDURE:  EGD w/ snare polypectomy and EGD w/ biopsy ASA CLASS:     Class III INDICATIONS:  dysphagia. MEDICATIONS: Monitored anesthesia care and Propofol 300 mg IV TOPICAL ANESTHETIC: none DESCRIPTION OF PROCEDURE: After the risks benefits and alternatives of the procedure were thoroughly explained, informed consent was obtained.  The [id number] endoscope was introduced through the mouth and advanced to the second portion of the duodenum , Without limitations.  The instrument was slowly withdrawn as the mucosa was fully examined.    STOMACH: A pedunculated polyp measuring 12 mm in size with a friable surface was found in the cardia at the distal aspect of the hiatal hernia.  A polypectomy was performed with snare cautery.  The resection was complete and the polyp tissue was completely retrieved.  To prevent future bleeding 1 Hemoclip was placed on the site.   A few sessile polyps ranging between 3-96mm in size were found in the gastric body.  Multiple sampling biopsies were performed.   The stomach otherwise appeared normal. ESOPHAGUS: The mucosa of the esophagus appeared normal however it was tortuous. DUODENUM: The duodenal mucosa showed no abnormalities in the bulb and 2nd part of the duodenum.  Retroflexed views revealed a small hiatal hernia.   The scope was then withdrawn from the patient and the procedure completed.  COMPLICATIONS: There were no immediate complications.  ENDOSCOPIC IMPRESSION: 1.   Pedunculated polyp in the cardia; polypectomy performed; Hemoclip applied to site 2.   Few sessile polyps in the gastric body; multiple sampling biopsies performed 3.    Small hiatal hernia 4.   Tortuous esophagus  RECOMMENDATIONS: 1.  Anti-reflux regimen 2.  Await pathology results 3.  Continue PPI daily 4.  No ASA/NSAIDs for 2 weeks 5.  Resume Coumadin in 2 days 6.  Consider esophageal manometry 7.  Office follow up in 1 month   eSigned:  Ladene Artist, MD, [facility] 08/06/2015 10:52 AM    PATIENT NAME:  Stacey Davenport, Stacey Davenport MR#: [id number]

## 2015-08-07 ENCOUNTER — Telehealth: Payer: Self-pay | Admitting: *Deleted

## 2015-08-07 NOTE — Telephone Encounter (Signed)
  Follow up Call-  Call back number 08/06/2015  Post procedure Call Back phone  # [phone]  Permission to leave phone message Yes     Patient questions:  Do you have a fever, pain , or abdominal swelling? Yes.   Pain Score  0 *  Have you tolerated food without any problems? Yes.    Have you been able to return to your normal activities? Yes.    Do you have any questions about your discharge instructions: Diet   No. Medications  No. Follow up visit  No.  Do you have questions or concerns about your Care? No.  Actions: * If pain score is 4 or above: No action needed, pain <4.

## 2015-08-12 ENCOUNTER — Encounter: Payer: Self-pay | Admitting: Gastroenterology

## 2015-08-14 ENCOUNTER — Other Ambulatory Visit: Payer: Self-pay | Admitting: Family Medicine

## 2015-08-14 NOTE — Telephone Encounter (Signed)
Last seen 11/14/13  DWM

## 2015-08-15 NOTE — Telephone Encounter (Signed)
There is a question that patient still gets her care here. For sure she should get her eyedrops from ophthalmologist so will deny prescriptions.

## 2015-08-19 NOTE — Telephone Encounter (Signed)
Pt aware her Rx's should be refilled by eye dr.

## 2015-08-19 NOTE — Telephone Encounter (Signed)
We think patient no longer receives care here but has transferred [facility] to [facility] [place]

## 2015-09-04 ENCOUNTER — Ambulatory Visit: Payer: No Typology Code available for payment source | Admitting: Gastroenterology

## 2015-09-24 ENCOUNTER — Encounter: Payer: Self-pay | Admitting: Gastroenterology

## 2015-09-24 ENCOUNTER — Ambulatory Visit (INDEPENDENT_AMBULATORY_CARE_PROVIDER_SITE_OTHER): Payer: Medicare (Managed Care) | Admitting: Gastroenterology

## 2015-09-24 VITALS — BP 158/64 | HR 64 | Ht 64.0 in | Wt 187.0 lb

## 2015-09-24 DIAGNOSIS — K317 Polyp of stomach and duodenum: Secondary | ICD-10-CM | POA: Diagnosis not present

## 2015-09-24 DIAGNOSIS — K219 Gastro-esophageal reflux disease without esophagitis: Secondary | ICD-10-CM

## 2015-09-24 NOTE — Patient Instructions (Addendum)
Stay on omeprazole twice daily.   Follow up with Dr. Fuller Plan in one year.   Thank you for choosing me and [facility].  Pricilla Riffle. Dagoberto Ligas., MD., [facility]

## 2015-09-24 NOTE — Progress Notes (Signed)
    History of Present Illness: This is a 75 year old female returning after EGD performed in January for dysphagia. EGD impression and recommendations below. Pathology on the gastric polyps was hyperplastic. Her reflux symptoms are under good control although she does have occasional breakthrough symptoms. Her dysphagia has not been bothersome recently. She has only noted difficulty with large pills.  ENDOSCOPIC IMPRESSION: 1. Pedunculated polyp in the cardia; polypectomy performed; Hemoclip applied to site 2. Few sessile polyps in the gastric body; multiple sampling biopsies performed 3. Small hiatal hernia 4. Tortuous esophagus  RECOMMENDATIONS: 1. Anti-reflux regimen 2. Await pathology results 3. Continue PPI daily 4. No ASA/NSAIDs for 2 weeks 5. Resume Coumadin in 2 days 6. Consider esophageal manometry 7. Office follow up in 1 month  Current Medications, Allergies, Past Medical History, Past Surgical History, Family History and Social History were reviewed in [employer] record.  Physical Exam: General: Well developed, well nourished, no acute distress Head: Normocephalic and atraumatic Eyes:  sclerae anicteric, EOMI Ears: Normal auditory acuity Mouth: No deformity or lesions Lungs: Clear throughout to auscultation Heart: Regular rate and rhythm; no murmurs, rubs or bruits Abdomen: Soft, non tender and non distended. No masses, hepatosplenomegaly or hernias noted. Normal Bowel sounds Musculoskeletal: Symmetrical with no gross deformities  Pulses:  Normal pulses noted Extremities: No clubbing, cyanosis, edema or deformities noted Neurological: Alert oriented x 4, grossly nonfocal Psychological:  Alert and cooperative. Normal mood and affect  Assessment and Recommendations:  1. GERD and dysphagia. Continue standard antireflux measures and omeprazole 40 mg twice a day taken before breakfast and dinner. Use Tums as needed for breakthrough symptoms. If  dysphagia becomes more bothersome consider esophageal manometry.  2. Hyperplastic gastric polyps. The larger hyperplastic polyp located in the gastric cardia was removed.  3. History of atrial fibrillation maintained on Coumadin anticoagulation.  I spent 15 minutes of face-to-face time with the patient. Greater than 50% of the time was spent counseling and coordinating care.

## 2015-09-30 ENCOUNTER — Encounter: Payer: Self-pay | Admitting: Gastroenterology

## 2015-11-27 ENCOUNTER — Other Ambulatory Visit: Payer: Self-pay

## 2015-11-27 DIAGNOSIS — Z1231 Encounter for screening mammogram for malignant neoplasm of breast: Secondary | ICD-10-CM

## 2015-12-10 ENCOUNTER — Ambulatory Visit
Admission: RE | Admit: 2015-12-10 | Discharge: 2015-12-10 | Disposition: A | Discharge status: Home / Self Care | Payer: Medicare (Managed Care) | Source: Ambulatory Visit

## 2015-12-10 DIAGNOSIS — Z1231 Encounter for screening mammogram for malignant neoplasm of breast: Secondary | ICD-10-CM

## 2016-01-09 ENCOUNTER — Ambulatory Visit (INDEPENDENT_AMBULATORY_CARE_PROVIDER_SITE_OTHER): Payer: Medicare (Managed Care) | Admitting: Family Medicine

## 2016-01-09 ENCOUNTER — Encounter: Payer: Self-pay | Admitting: Family Medicine

## 2016-01-09 VITALS — BP 166/78 | HR 63 | Temp 97.2°F | Ht 64.0 in | Wt 191.0 lb

## 2016-01-09 DIAGNOSIS — E785 Hyperlipidemia, unspecified: Secondary | ICD-10-CM | POA: Diagnosis not present

## 2016-01-09 DIAGNOSIS — I10 Essential (primary) hypertension: Secondary | ICD-10-CM

## 2016-01-09 DIAGNOSIS — E039 Hypothyroidism, unspecified: Secondary | ICD-10-CM

## 2016-01-09 NOTE — Progress Notes (Signed)
Subjective:    Patient ID: Stacey Davenport, female    DOB: [birth year], 75 y.o.   MRN: [id number]  HPI Patient here today for a follow up. [facility] [place]. All of her care is followed there. The patient is doing well overall. Her blood pressures at home have been running in the 140s over the 70s. I told her that we prefer this to be less than 140. She is doing fairly well with her rheumatoid arthritis. She is having some reflux symptoms with the omeprazole and was better control with dexilant. She denies any chest pain or shortness of breath and indicates that her asthma other than with the stem weather has been under fairly good control with her inhalers. She does have the reflux flux and she will discuss changing back to the PPI that works the best. Her bowels are moving well with no blood in the stool or black tarry bowel movements. She is passing her water without problems. She continues to have problems with her generalized arthritic problems including her back and knees. She did have a stroke and because of her atrial fibrillation she is taking warfarin regularly. She uses a walker the majority of the time because she has some weakness with movements. She is alert. She is enjoying participating in this program and appreciates the socialization that she has with people there and she goes there 3 days a week. In the pretty much manage all her medical conditions except for the visits to the cardiologist and the eye doctor. She sees the eye doctor as planned and sees a cardiologist once a year.     Patient Active Problem List   Diagnosis Date Noted  . High risk medication use 09/03/2013  . Multiple drug allergies 09/03/2013  . 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 Hypothyroidism 04/23/2013  . Hyperlipidemia 12/06/2012  . CVA History 12/06/2012  . Atrial fibrillation ([place]) 09/30/2011  . Hypertension 08/20/2009  . Glaucoma 04/17/2009  .  ASTHMA 04/17/2009  . ESOPHAGEAL STRICTURE 04/17/2009  . GERD 04/17/2009  . Rheumatoid arthritis(714.0) 04/17/2009   Outpatient Encounter Prescriptions as of 01/09/2016  Medication Sig  . amiodarone (PACERONE) 200 MG tablet Take 1 tablet (200 mg total) by mouth daily.  . brimonidine (ALPHAGAN) 0.2 % ophthalmic solution USE 1 DROP IN EACH EYE 3 TIMES DAILY  . calcium-vitamin D (OSCAL) 250-125 MG-UNIT per tablet Take 1 tablet by mouth daily.    . cholecalciferol (VITAMIN D) 1000 UNITS tablet Take 2,000 Units by mouth daily.  . ciclesonide (ALVESCO) 160 MCG/ACT inhaler Inhale 1 puff into the lungs 2 (two) times daily. (Patient taking differently: Inhale 2 puffs into the lungs 2 (two) times daily. )  . diltiazem (CARDIZEM) 120 MG tablet Take 120 mg by mouth daily. AM  . diltiazem (TIAZAC) 180 MG 24 hr capsule Take 180 mg by mouth daily. Take one tablet by mouth in the evening  . latanoprost (XALATAN) 0.005 % ophthalmic solution PLACE 1 DROP IN EACH EYE AT BEDTIME  . levalbuterol (XOPENEX) 0.63 MG/3ML nebulizer solution Take 0.63 mg by nebulization every 8 (eight) hours as needed for wheezing or shortness of breath.  . levothyroxine (SYNTHROID, LEVOTHROID) 75 MCG tablet Take 75 mcg by mouth daily before breakfast. Take once daily  . loratadine (CLARITIN) 10 MG tablet Take 1 tablet (10 mg total) by mouth daily. (Patient taking differently: Take 10 mg by mouth daily as needed. )  . LORazepam (ATIVAN)  0.5 MG tablet TAKE 1 TO 2 TABLETS EVERY DAY  . montelukast (SINGULAIR) 10 MG tablet TAKE ONE (1) TABLET EACH DAY  . Olopatadine HCl 0.2 % SOLN Apply 1 drop to eye daily.  Marland Kitchen omeprazole (PRILOSEC) 40 MG capsule Take 40 mg by mouth 2 (two) times daily.   . predniSONE (DELTASONE) 5 MG tablet TAKE ONE (1) TABLET EACH DAY  . traMADol (ULTRAM) 50 MG tablet Take 25 mg by mouth every 12 (twelve) hours as needed.  . warfarin (COUMADIN) 4 MG tablet Take 2 mg by mouth as directed. Take 4 mg Monday, Thursday. Take 2  mg Sunday, Tuesday, Wednesday, Friday and Saturday  . XOPENEX HFA 45 MCG/ACT inhaler USE 2 PUFFS TWICE DAILY  . [DISCONTINUED] acetaminophen (TYLENOL) 325 MG tablet Take 650 mg by mouth every 6 (six) hours as needed for pain.  . [DISCONTINUED] fluticasone (FLONASE) 50 MCG/ACT nasal spray USE 1 SPRAY IN EACH NOSTRIL ONCE DAILY (Patient taking differently: USE 1 SPRAY IN EACH NOSTRIL ONCE DAILY as needed)  . [DISCONTINUED] furosemide (LASIX) 20 MG tablet Take 20 mg by mouth as needed for fluid or edema. Reported on 08/06/2015  . [DISCONTINUED] magnesium hydroxide (MILK OF MAGNESIA) 400 MG/5ML suspension Take 15 mLs by mouth at bedtime as needed for mild constipation. Reported on 08/06/2015   No facility-administered encounter medications on file as of 01/09/2016.      Review of Systems  Constitutional: Negative.   HENT: Negative.   Eyes: Negative.   Respiratory: Negative.   Cardiovascular: Negative.   Gastrointestinal: Negative.   Endocrine: Negative.   Genitourinary: Negative.   Musculoskeletal: Negative.   Skin: Negative.   Allergic/Immunologic: Negative.   Neurological: Negative.   Hematological: Negative.   Psychiatric/Behavioral: Negative.        Objective:   Physical Exam  Constitutional: She is oriented to person, place, and time. She appears well-developed and well-nourished.  HENT:  Head: Normocephalic and atraumatic.  Right Ear: External ear normal.  Left Ear: External ear normal.  Nose: Nose normal.  Mouth/Throat: Oropharynx is clear and moist.  Eyes: Conjunctivae and EOM are normal. Pupils are equal, round, and reactive to light. Right eye exhibits no discharge. Left eye exhibits no discharge. No scleral icterus.  Neck: Normal range of motion. Neck supple. No thyromegaly present.  No bruits or thyromegaly  Cardiovascular: Normal rate, regular rhythm and normal heart sounds.   No murmur heard. Heart has a regular rate and rhythm at 60/m  Pulmonary/Chest: Effort  normal and breath sounds normal. No respiratory distress. She has no wheezes. She has no rales.  No wheezing was audible.  Abdominal: Soft. Bowel sounds are normal. She exhibits no mass. There is no tenderness. There is no rebound and no guarding.  Minimal epigastric tenderness  Musculoskeletal: She exhibits no edema or tenderness.  The patient has good strength in all of her extremities but uses the walker for stabilization purposes.  Lymphadenopathy:    She has no cervical adenopathy.  Neurological: She is alert and oriented to person, place, and time. She has normal reflexes. No cranial nerve deficit.  Skin: Skin is warm and dry. No rash noted.  Psychiatric: She has a normal mood and affect. Her behavior is normal. Judgment and thought content normal.  Nursing note and vitals reviewed.  BP 151/77 mmHg  Pulse 63  Temp(Src) 97.2 F (36.2 C) (Oral)  Ht 5\' 4"  (1.626 m)  Wt 191 lb (86.637 kg)  BMI 32.77 kg/m2  Repeat blood pressure in office  today was higher than the initial blood pressure at 166/78 in the right arm large cuff sitting.      Assessment & Plan:  1. Hypothyroidism, unspecified hypothyroidism type -Continue to monitor this regularly and adjust medication as needed  2. Hyperlipidemia -Continue to monitor regularly and follow-up with cardiology  3. Essential hypertension -Consider increasing diltiazem to 180 twice daily -Watch sodium intake more closely  4. Rheumatoid arthritis -Check sedimentation rate  Patient Instructions                       Medicare Annual Wellness Visit  [facility] and the medical providers at [facility] strive to bring you the best medical care.  In doing so we not only want to address your current medical conditions and concerns but also to detect new conditions early and prevent illness, disease and health-related problems.    Medicare offers a yearly Wellness Visit which allows our clinical staff to assess your  need for preventative services including immunizations, lifestyle education, counseling to decrease risk of preventable diseases and screening for fall risk and other medical concerns.    This visit is provided free of charge (no copay) for all Medicare recipients. The clinical pharmacists at [facility] have begun to conduct these Wellness Visits which will also include a thorough review of all your medications.    As you primary medical provider recommend that you make an appointment for your Annual Wellness Visit if you have not done so already this year.  You may set up this appointment before you leave today or you may call back [phone] and schedule an appointment.  Please make sure when you call that you mention that you are scheduling your Annual Wellness Visit with the clinical pharmacist so that the appointment may be made for the proper length of time.     Continue current medications. Continue good therapeutic lifestyle changes which include good diet and exercise. Fall precautions discussed with patient. If an FOBT was given today- please return it to our front desk. If you are over 85 years old - you may need Prevnar 17 or the adult Pneumonia vaccine.   After your visit with [place] today you will receive a survey in the mail or online from [employer] regarding your care with [place]. Please take a moment to fill this out. Your feedback is very important to [place] as you can help [place] better understand your patient needs as well as improve your experience and satisfaction. WE CARE ABOUT YOU!!!   I would recommend monitoring the blood pressure more closely and if it stays elevated above 140 to increase the diltiazem back to 180 twice daily She should continue to watch her sodium intake closely and get as much exercise as possible I would also recommend a trial on dexilant as this helped her reflux symptoms better and ultimately her breathing better than omeprazole. I would  also recommend getting a sedimentation rate periodically to make sure that her rheumatoid arthritis has been under good control.   Arrie Senate MD

## 2016-01-09 NOTE — Patient Instructions (Addendum)
Medicare Annual Wellness Visit  [facility] and the medical providers at [facility] strive to bring you the best medical care.  In doing so we not only want to address your current medical conditions and concerns but also to detect new conditions early and prevent illness, disease and health-related problems.    Medicare offers a yearly Wellness Visit which allows our clinical staff to assess your need for preventative services including immunizations, lifestyle education, counseling to decrease risk of preventable diseases and screening for fall risk and other medical concerns.    This visit is provided free of charge (no copay) for all Medicare recipients. The clinical pharmacists at [facility] have begun to conduct these Wellness Visits which will also include a thorough review of all your medications.    As you primary medical provider recommend that you make an appointment for your Annual Wellness Visit if you have not done so already this year.  You may set up this appointment before you leave today or you may call back [phone] and schedule an appointment.  Please make sure when you call that you mention that you are scheduling your Annual Wellness Visit with the clinical pharmacist so that the appointment may be made for the proper length of time.     Continue current medications. Continue good therapeutic lifestyle changes which include good diet and exercise. Fall precautions discussed with patient. If an FOBT was given today- please return it to our front desk. If you are over 63 years old - you may need Prevnar 63 or the adult Pneumonia vaccine.   After your visit with [place] today you will receive a survey in the mail or online from [employer] regarding your care with [place]. Please take a moment to fill this out. Your feedback is very important to [place] as you can help [place] better understand your patient needs as well as  improve your experience and satisfaction. WE CARE ABOUT YOU!!!   I would recommend monitoring the blood pressure more closely and if it stays elevated above 140 to increase the diltiazem back to 180 twice daily She should continue to watch her sodium intake closely and get as much exercise as possible I would also recommend a trial on dexilant as this helped her reflux symptoms better and ultimately her breathing better than omeprazole. I would also recommend getting a sedimentation rate periodically to make sure that her rheumatoid arthritis has been under good control.

## 2016-01-15 ENCOUNTER — Other Ambulatory Visit: Payer: Self-pay | Admitting: Family Medicine

## 2016-03-15 NOTE — Progress Notes (Signed)
HPI The patient presents for followup of her atrial fibrillation and labile blood pressures. She was on beta blockers briefly but this made her too fatigued. She subsequently came upon a combination of Cardizem 120 mg during the day and 180 mg at night seems to control her blood pressure symptoms.  She returns for follow up.  She's had some palpitations that have been infrequent. She is having more fluctuating blood pressures are typically higher. She's not having any other cardiovascular symptoms. [facility] to [facility] and does some physical activity although she gets around slowly with a rolling walker. With this she denies any chest pressure, neck or arm discomfort. She does have shortness of breath related to her asthma but she's not describing PND or orthopnea. She sleeps in a chair because of back problems.  Allergies  Allergen Reactions  . Aciphex [Rabeprazole Sodium]   . Amoxicillin   . Biaxin [Clarithromycin] Hives and Itching  . Cephalexin   . Esomeprazole Magnesium     Takes Dexilant at home  . Hydrocodone   . Hydrocodone-Acetaminophen   . Ketek [Telithromycin]   . Latex   . Levaquin [Levofloxacin In D5w]   . Lipitor [Atorvastatin]   . Macrobid [Nitrofurantoin Macrocrystal]   . Moxifloxacin   . Nexium [Esomeprazole Magnesium]   . Rabeprazole Sodium     Takes Dexilant at home  . Sulfonamide Derivatives   . Vesicare [Solifenacin]   . Zithromax [Azithromycin]     Current Outpatient Prescriptions  Medication Sig Dispense Refill  . amiodarone (PACERONE) 200 MG tablet Take 1 tablet (200 mg total) by mouth daily. 30 tablet 11  . brimonidine (ALPHAGAN) 0.2 % ophthalmic solution USE 1 DROP IN EACH EYE 3 TIMES DAILY 10 mL 0  . calcium-vitamin D (OSCAL) 250-125 MG-UNIT per tablet Take 1 tablet by mouth daily.      . cholecalciferol (VITAMIN D) 1000 UNITS tablet Take 2,000 Units by mouth daily.    . ciclesonide (ALVESCO) 160 MCG/ACT inhaler Inhale 1 puff into the lungs 2 (two) times  daily. (Patient taking differently: Inhale 2 puffs into the lungs 2 (two) times daily. ) 1 Inhaler 2  . diltiazem (CARDIZEM) 120 MG tablet Take 120 mg by mouth daily. AM    . diltiazem (TIAZAC) 180 MG 24 hr capsule Take 180 mg by mouth daily. Take one tablet by mouth in the evening    . latanoprost (XALATAN) 0.005 % ophthalmic solution PLACE 1 DROP IN EACH EYE AT BEDTIME 2.5 mL 0  . levalbuterol (XOPENEX) 0.63 MG/3ML nebulizer solution Take 0.63 mg by nebulization every 8 (eight) hours as needed for wheezing or shortness of breath.    . levothyroxine (SYNTHROID, LEVOTHROID) 75 MCG tablet Take 75 mcg by mouth daily before breakfast. Take once daily    . loratadine (CLARITIN) 10 MG tablet Take 1 tablet (10 mg total) by mouth daily. (Patient taking differently: Take 10 mg by mouth daily as needed. ) 30 tablet 11  . LORazepam (ATIVAN) 0.5 MG tablet TAKE 1 TO 2 TABLETS EVERY DAY 60 tablet 1  . montelukast (SINGULAIR) 10 MG tablet TAKE ONE (1) TABLET EACH DAY 30 tablet 3  . Olopatadine HCl 0.2 % SOLN Apply 1 drop to eye daily.    Marland Kitchen omeprazole (PRILOSEC) 40 MG capsule Take 40 mg by mouth 2 (two) times daily.     . predniSONE (DELTASONE) 5 MG tablet TAKE ONE (1) TABLET EACH DAY 10 tablet 0  . traMADol (ULTRAM) 50 MG tablet Take 25  mg by mouth every 12 (twelve) hours as needed.    . warfarin (COUMADIN) 4 MG tablet Take 2 mg by mouth as directed. Take 4 mg Monday, Thursday. Take 2 mg Sunday, Tuesday, Wednesday, Friday and Saturday    . XOPENEX HFA 45 MCG/ACT inhaler USE 2 PUFFS TWICE DAILY 15 g 2   No current facility-administered medications for this visit.     Past Medical History:  Diagnosis Date  . Anxiety   . Asthma   . Atrial fibrillation ([place])   . CVA (cerebral infarction)    a. 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 Esophageal stricture   . Gastric polyp    Hyperplastic  . Gastritis   . GERD (gastroesophageal reflux disease)   . Glaucoma   . Hiatal  hernia   . Hypertension   . Hypothyroidism   . Osteoarthritis   . Pneumonia   . Rheumatoid arthritis(714.0)   . Thyroid disease     Past Surgical History:  Procedure Laterality Date  . APPENDECTOMY    . BLEPHAROPLASTY Right   . CATARACT EXTRACTION Bilateral   . CHOLECYSTECTOMY    . TUBAL LIGATION      ROS:  Asthma. Otherwise as stated in the HPI and negative for all other systems.  PHYSICAL EXAM BP (!) 161/85   Pulse 74   Ht 5\' 4"  (1.626 m)   Wt 182 lb (82.6 kg)   BMI 31.24 kg/m  GENERAL:  Well appearing NECK:  No jugular venous distention, waveform within normal limits, carotid upstroke brisk and symmetric, no bruits, no thyromegaly LUNGS:  Clear to auscultation bilaterally CHEST:  Unremarkable HEART:  PMI not displaced or sustained,S1 and S2 within normal limits, no S3, no S4, no clicks, no rubs, no murmurs, regular ABD:  Flat, positive bowel sounds normal in frequency in pitch, no bruits, no rebound, no guarding, no midline pulsatile mass, no hepatomegaly, no splenomegaly EXT:  2 plus pulses throughout, no edema, no cyanosis no clubbing SKIN:  No rashes.  EKG:   Sinus rhythm, rate 74, first degree AV block , left atrial enlargement, poor anterior R wave progression, right axis deviation , no acute ST-T wave changes. 03/17/2016  ASSESSMENT AND PLAN  Atrial fibrillation -  She has not had symptomatic paroxysms.  [name] has a [id number] - VASc score of 6 with a risk of stroke of 9.8%.   She is tolerating anticoagulation and probably for the most part maintaining sinus rhythm with amiodarone. I did review some recent labs and in May normal liver profile and TSH. She will continue on amiodarone.  Essential hypertension, benign -   Her BP is labile but overall slightly elevated. We will go ahead and try Cardizem 180 mg twice daily. She'll keep a blood pressure diary.

## 2016-03-17 ENCOUNTER — Ambulatory Visit (INDEPENDENT_AMBULATORY_CARE_PROVIDER_SITE_OTHER): Payer: Medicare (Managed Care) | Admitting: Cardiology

## 2016-03-17 ENCOUNTER — Encounter: Payer: Self-pay | Admitting: Cardiology

## 2016-03-17 VITALS — BP 161/85 | HR 74 | Ht 64.0 in | Wt 182.0 lb

## 2016-03-17 DIAGNOSIS — I480 Paroxysmal atrial fibrillation: Secondary | ICD-10-CM | POA: Diagnosis not present

## 2016-03-17 DIAGNOSIS — I10 Essential (primary) hypertension: Secondary | ICD-10-CM | POA: Diagnosis not present

## 2016-03-17 MED ORDER — DILTIAZEM HCL ER BEADS 180 MG PO CP24: 180.0000 mg | ORAL_CAPSULE | Freq: Two times a day (BID) | ORAL | 11 refills | Status: DC

## 2016-03-17 NOTE — Patient Instructions (Signed)
Medication Instructions:  Please increase your Cardizem to 180 mg twice a day. Continue all other medications as listed.  Follow-Up: Follow up as needed with Dr Percival Spanish.  Thank you for choosing [facility]!!

## 2016-03-24 ENCOUNTER — Encounter: Payer: Self-pay | Admitting: Cardiology

## 2016-04-15 ENCOUNTER — Telehealth: Payer: Self-pay | Admitting: *Deleted

## 2016-04-15 NOTE — Telephone Encounter (Signed)
Incoming call from pt Pt is having allergy sxs, cough and wheezing She is using xopenex and Alvesco but continued sxs She would like [facility]ill of [facility] Please advise

## 2016-04-15 NOTE — Telephone Encounter (Signed)
[facility] called in to [facility] Pt notified

## 2016-04-15 NOTE — Telephone Encounter (Signed)
Please do prednisone 10 Taper #20

## 2016-11-15 ENCOUNTER — Ambulatory Visit
Admission: RE | Admit: 2016-11-15 | Discharge: 2016-11-15 | Disposition: A | Discharge status: Home / Self Care | Payer: Medicare (Managed Care) | Source: Ambulatory Visit | Attending: Family Medicine | Admitting: Family Medicine

## 2016-11-15 ENCOUNTER — Other Ambulatory Visit: Payer: Self-pay | Admitting: Family Medicine

## 2016-11-15 DIAGNOSIS — E785 Hyperlipidemia, unspecified: Secondary | ICD-10-CM

## 2016-11-15 DIAGNOSIS — I5030 Unspecified diastolic (congestive) heart failure: Secondary | ICD-10-CM

## 2016-11-15 DIAGNOSIS — I10 Essential (primary) hypertension: Secondary | ICD-10-CM

## 2017-01-17 ENCOUNTER — Other Ambulatory Visit: Payer: Self-pay | Admitting: Family Medicine

## 2017-02-02 ENCOUNTER — Telehealth: Payer: Self-pay

## 2017-02-02 ENCOUNTER — Encounter (INDEPENDENT_AMBULATORY_CARE_PROVIDER_SITE_OTHER): Payer: Self-pay

## 2017-02-02 ENCOUNTER — Encounter: Payer: Self-pay | Admitting: Gastroenterology

## 2017-02-02 ENCOUNTER — Ambulatory Visit (INDEPENDENT_AMBULATORY_CARE_PROVIDER_SITE_OTHER): Payer: Medicare (Managed Care) | Admitting: Gastroenterology

## 2017-02-02 VITALS — BP 144/72 | HR 60 | Ht 64.0 in | Wt 180.0 lb

## 2017-02-02 DIAGNOSIS — K219 Gastro-esophageal reflux disease without esophagitis: Secondary | ICD-10-CM

## 2017-02-02 DIAGNOSIS — Z7901 Long term (current) use of anticoagulants: Secondary | ICD-10-CM

## 2017-02-02 DIAGNOSIS — D509 Iron deficiency anemia, unspecified: Secondary | ICD-10-CM

## 2017-02-02 MED ORDER — NA SULFATE-K SULFATE-MG SULF 17.5-3.13-1.6 GM/177ML PO SOLN: 1.0000 | Freq: Once | ORAL | 0 refills | Status: AC

## 2017-02-02 NOTE — Telephone Encounter (Signed)
   Stacey Davenport [birth year] [id number]  Dear Dr. Percival Spanish:  We have scheduled the above named patient for a(n) Colonoscopy procedure. Our records show that (s)he is on anticoagulation therapy.  Please advise as to whether the patient may come off their therapy of coumadin 5 days prior to their procedure which is scheduled for 02/17/17.  Please route your response to Marlon Pel, CMA or fax response to [phone].  Sincerely,    [facility]

## 2017-02-02 NOTE — Patient Instructions (Addendum)
Your iron infusion is scheduled for 02/11/17 at 10:00am at [facility] (1st floor admitting).   You have been scheduled for a colonoscopy. Please follow written instructions given to you at your visit today.  Please pick up your prep supplies at the pharmacy within the next 1-3 days. If you use inhalers (even only as needed), please bring them with you on the day of your procedure. Your physician has requested that you go to www.startemmi.com and enter the access code given to you at your visit today. This web site gives a general overview about your procedure. However, you should still follow specific instructions given to you by our office regarding your preparation for the procedure.  Thank you for choosing me and [facility].  Pricilla Riffle. Dagoberto Ligas., MD., Marval Regal

## 2017-02-02 NOTE — Progress Notes (Signed)
History of Present Illness: This is a 76 year old female with iron deficiency anemia. Recent Hb=10.5, MCV=76 Fe=12, TIBC=417, sat=3%. She has 2-3 BMs/day and she relates certain foods can loosen her stools. No change in bowel pattern. Her last colonoscopy was in 2004 showed only hemorrhoids. She has not had a colonoscopy since then. She was started on oral iron which led to significant dyspeptic symptoms. She tried it with and without food however her dyspeptic symptoms remain the same and she discontinued it after only a few days. Denies weight loss, abdominal pain, constipation, change in stool caliber, melena, hematochezia, nausea, vomiting, dysphagia, reflux symptoms, chest pain.  EGD 07/2015 ENDOSCOPIC IMPRESSION: 1. Pedunculated polyp in the cardia; polypectomy performed; Hemoclip applied to site 2. Few sessile polyps in the gastric body; multiple sampling biopsies performed 3. Small hiatal hernia 4. Tortuous esophagus  Allergies  Allergen Reactions  . Aciphex [Rabeprazole Sodium]   . Amoxicillin   . Biaxin [Clarithromycin] Hives and Itching  . Cephalexin   . Esomeprazole Magnesium     Takes Dexilant at home  . Hydrocodone   . Hydrocodone-Acetaminophen   . Ketek [Telithromycin]   . Latex   . Levaquin [Levofloxacin In D5w]   . Lipitor [Atorvastatin]   . Macrobid [Nitrofurantoin Macrocrystal]   . Moxifloxacin   . Nexium [Esomeprazole Magnesium]   . Rabeprazole Sodium     Takes Dexilant at home  . Sulfonamide Derivatives   . Vesicare [Solifenacin]   . Zithromax [Azithromycin]    Outpatient Medications Prior to Visit  Medication Sig Dispense Refill  . amiodarone (PACERONE) 200 MG tablet Take 1 tablet (200 mg total) by mouth daily. 30 tablet 11  . brimonidine (ALPHAGAN) 0.2 % ophthalmic solution USE 1 DROP IN EACH EYE 3 TIMES DAILY 10 mL 0  . calcium-vitamin D (OSCAL) 250-125 MG-UNIT per tablet Take 1 tablet by mouth daily.      . cholecalciferol (VITAMIN D) 1000 UNITS  tablet Take 2,000 Units by mouth daily.    . ciclesonide (ALVESCO) 160 MCG/ACT inhaler Inhale 1 puff into the lungs 2 (two) times daily. (Patient taking differently: Inhale 2 puffs into the lungs 2 (two) times daily. ) 1 Inhaler 2  . diltiazem (TIAZAC) 180 MG 24 hr capsule Take 1 capsule (180 mg total) by mouth 2 (two) times daily. Take one tablet by mouth in the evening 60 capsule 11  . latanoprost (XALATAN) 0.005 % ophthalmic solution PLACE 1 DROP IN EACH EYE AT BEDTIME 2.5 mL 0  . levalbuterol (XOPENEX) 0.63 MG/3ML nebulizer solution Take 0.63 mg by nebulization every 8 (eight) hours as needed for wheezing or shortness of breath.    . levothyroxine (SYNTHROID, LEVOTHROID) 75 MCG tablet Take 75 mcg by mouth daily before breakfast. Take once daily    . LORazepam (ATIVAN) 0.5 MG tablet TAKE 1 TO 2 TABLETS EVERY DAY 60 tablet 1  . montelukast (SINGULAIR) 10 MG tablet TAKE ONE (1) TABLET EACH DAY 30 tablet 3  . predniSONE (DELTASONE) 5 MG tablet TAKE ONE (1) TABLET EACH DAY 10 tablet 0  . traMADol (ULTRAM) 50 MG tablet Take 25 mg by mouth every 12 (twelve) hours as needed.    Penne Lash HFA 45 MCG/ACT inhaler USE 2 PUFFS TWICE DAILY 15 g 2  . warfarin (COUMADIN) 4 MG tablet Take 2 mg by mouth as directed. Take 4 mg Monday, Thursday. Take 2 mg Sunday, Tuesday, Wednesday, Friday and Saturday    . loratadine (CLARITIN) 10 MG tablet Take  1 tablet (10 mg total) by mouth daily. (Patient taking differently: Take 10 mg by mouth daily as needed. ) 30 tablet 11  . Olopatadine HCl 0.2 % SOLN Apply 1 drop to eye daily.    Marland Kitchen omeprazole (PRILOSEC) 40 MG capsule Take 40 mg by mouth 2 (two) times daily.      No facility-administered medications prior to visit.    Past Medical History:  Diagnosis Date  . Anxiety   . Asthma   . Atrial fibrillation ([place])   . CVA (cerebral infarction)    a. 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 Esophageal stricture   . Gastric polyp     Hyperplastic  . Gastritis   . GERD (gastroesophageal reflux disease)   . Glaucoma   . Hiatal hernia   . Hyperlipidemia   . Hypertension   . Hypothyroidism   . Osteoarthritis   . Pneumonia   . Rheumatoid arthritis(714.0)   . Thyroid disease    Past Surgical History:  Procedure Laterality Date  . APPENDECTOMY    . BLEPHAROPLASTY Right   . CATARACT EXTRACTION Bilateral   . CHOLECYSTECTOMY    . TUBAL LIGATION     Social History   Social History  . Marital status: Divorced    Spouse name: N/A  . Number of children: N/A  . Years of education: N/A   Social History Main Topics  . Smoking status: Never Smoker  . Smokeless tobacco: Never Used  . Alcohol use No  . Drug use: No  . Sexual activity: No   Other Topics Concern  . None   Social History Narrative   Retired.    Family History  Problem Relation Age of Onset  . Diabetes Mother   . Cancer Father        lymph nodes???  . Diabetes Sister   . Heart disease Sister   . Colon cancer Neg Hx   . Esophageal cancer Neg Hx       Physical Exam: General: Well developed, well nourished, no acute distress Head: Normocephalic and atraumatic Eyes:  sclerae anicteric, EOMI Ears: Normal auditory acuity Mouth: No deformity or lesions Lungs: Clear throughout to auscultation Heart: Regular rate and rhythm; no murmurs, rubs or bruits Abdomen: Soft, non tender and non distended. No masses, hepatosplenomegaly or hernias noted. Normal Bowel sounds Rectal: deferred to colonoscopy Musculoskeletal: Symmetrical with no gross deformities  Pulses:  Normal pulses noted Extremities: No clubbing, cyanosis, edema or deformities noted Neurological: Alert oriented x 4, grossly nonfocal Psychological:  Alert and cooperative. Normal mood and affect  Assessment and Recommendations:  1. Iron deficiency anemia. She could not tolerate oral iron. Schedule IV iron infusion 2. Rule out occult colorectal sources of blood loss. Schedule  colonoscopy. The risks (including bleeding, perforation, infection, missed lesions, medication reactions and possible hospitalization or surgery if complications occur), benefits, and alternatives to colonoscopy with possible biopsy and possible polypectomy were discussed with the patient and they consent to proceed.   2. Hold Coumadin 5 days before procedure - will instruct when and how to resume after procedure. Low but real risk of cardiovascular event such as heart attack, stroke, embolism, thrombosis or ischemia/infarct of other organs off Coumadin explained and need to seek urgent help if this occurs. The patient consents to proceed. Will communicate by phone or EMR with patient's prescribing provider to confirm that holding Coumadin is reasonable in this case.   3. GERD with a history of an esophageal  stricture. Continue Dexilant 60 mg daily.

## 2017-02-03 NOTE — Telephone Encounter (Signed)
The patient can hold her warfarin 5 days prior to the procedure.  No bridging anticoagulation is indicated.

## 2017-02-03 NOTE — Telephone Encounter (Signed)
Informed patient to hold coumadin 5 days prior to procedure per Dr. Percival Spanish. Patient verbalized understanding.

## 2017-02-08 ENCOUNTER — Encounter (HOSPITAL_COMMUNITY): Payer: Medicare (Managed Care)

## 2017-02-10 ENCOUNTER — Other Ambulatory Visit (HOSPITAL_COMMUNITY): Payer: Self-pay | Admitting: *Deleted

## 2017-02-11 ENCOUNTER — Ambulatory Visit (HOSPITAL_COMMUNITY)
Admission: RE | Admit: 2017-02-11 | Discharge: 2017-02-11 | Disposition: A | Discharge status: Home / Self Care | Payer: Medicare (Managed Care) | Source: Ambulatory Visit | Attending: Gastroenterology | Admitting: Gastroenterology

## 2017-02-11 DIAGNOSIS — D500 Iron deficiency anemia secondary to blood loss (chronic): Secondary | ICD-10-CM | POA: Insufficient documentation

## 2017-02-11 DIAGNOSIS — D509 Iron deficiency anemia, unspecified: Secondary | ICD-10-CM

## 2017-02-11 MED ORDER — SODIUM CHLORIDE 0.9 % IV SOLN
510.0000 mg | INTRAVENOUS | Status: DC
  Administered 2017-02-11: 510 mg via INTRAVENOUS
  Filled 2017-02-11: qty 17

## 2017-02-11 NOTE — Discharge Instructions (Signed)

## 2017-02-17 ENCOUNTER — Ambulatory Visit (AMBULATORY_SURGERY_CENTER): Payer: Medicare (Managed Care) | Admitting: Gastroenterology

## 2017-02-17 ENCOUNTER — Encounter: Payer: Self-pay | Admitting: Gastroenterology

## 2017-02-17 VITALS — BP 130/59 | HR 59 | Temp 97.8°F | Resp 12 | Ht 64.0 in | Wt 180.0 lb

## 2017-02-17 DIAGNOSIS — D124 Benign neoplasm of descending colon: Secondary | ICD-10-CM

## 2017-02-17 DIAGNOSIS — D125 Benign neoplasm of sigmoid colon: Secondary | ICD-10-CM

## 2017-02-17 DIAGNOSIS — D509 Iron deficiency anemia, unspecified: Secondary | ICD-10-CM | POA: Diagnosis not present

## 2017-02-17 MED ORDER — SODIUM CHLORIDE 0.9 % IV SOLN: 500.0000 mL | INTRAVENOUS | Status: AC

## 2017-02-17 NOTE — Progress Notes (Signed)
Called to room to assist during endoscopic procedure.  Patient ID and intended procedure confirmed with present staff. Received instructions for my participation in the procedure from the performing physician.  

## 2017-02-17 NOTE — Patient Instructions (Signed)
Discharge instructions given. Handouts on polyps,diverticulosis and hemorrhoids. Resume previous medications. Resume Coumadin tomorrow at prior dose. YOU HAD AN ENDOSCOPIC PROCEDURE TODAY AT [facility]:   Refer to the procedure report that was given to you for any specific questions about what was found during the examination.  If the procedure report does not answer your questions, please call your gastroenterologist to clarify.  If you requested that your care partner not be given the details of your procedure findings, then the procedure report has been included in a sealed envelope for you to review at your convenience later.  YOU SHOULD EXPECT: Some feelings of bloating in the abdomen. Passage of more gas than usual.  Walking can help get rid of the air that was put into your GI tract during the procedure and reduce the bloating. If you had a lower endoscopy (such as a colonoscopy or flexible sigmoidoscopy) you may notice spotting of blood in your stool or on the toilet paper. If you underwent a bowel prep for your procedure, you may not have a normal bowel movement for a few days.  Please Note:  You might notice some irritation and congestion in your nose or some drainage.  This is from the oxygen used during your procedure.  There is no need for concern and it should clear up in a day or so.  SYMPTOMS TO REPORT IMMEDIATELY:   Following lower endoscopy (colonoscopy or flexible sigmoidoscopy):  Excessive amounts of blood in the stool  Significant tenderness or worsening of abdominal pains  Swelling of the abdomen that is new, acute  Fever of 100F or higher  For urgent or emergent issues, a gastroenterologist can be reached at any hour by calling [phone].   DIET:  We do recommend a small meal at first, but then you may proceed to your regular diet.  Drink plenty of fluids but you should avoid alcoholic beverages for 24 hours.  ACTIVITY:  You should plan to take  it easy for the rest of today and you should NOT DRIVE or use heavy machinery until tomorrow (because of the sedation medicines used during the test).    FOLLOW UP: Our staff will call the number listed on your records the next business day following your procedure to check on you and address any questions or concerns that you may have regarding the information given to you following your procedure. If we do not reach you, we will leave a message.  However, if you are feeling well and you are not experiencing any problems, there is no need to return our call.  We will assume that you have returned to your regular daily activities without incident.  If any biopsies were taken you will be contacted by phone or by letter within the next 1-3 weeks.  Please call us at [phone] if you have not heard about the biopsies in 3 weeks.    SIGNATURES/CONFIDENTIALITY: You and/or your care partner have signed paperwork which will be entered into your electronic medical record.  These signatures attest to the fact that that the information above on your After Visit Summary has been reviewed and is understood.  Full responsibility of the confidentiality of this discharge information lies with you and/or your care-partner.

## 2017-02-17 NOTE — Op Note (Signed)
[place] [facility] Patient Name: Stacey Davenport Procedure Date: 02/17/2017 11:16 AM MRN: [id number] Endoscopist: Ladene Artist , MD Age: 76 Referring MD:  Date of Birth: Apr 28, 1941 Gender: Female Account #: [id number] Procedure:                Colonoscopy Indications:              Iron deficiency anemia Medicines:                Monitored Anesthesia Care Procedure:                Pre-Anesthesia Assessment:                           - Prior to the procedure, a History and Physical                            was performed, and patient medications and                            allergies were reviewed. The patient's tolerance of                            previous anesthesia was also reviewed. The risks                            and benefits of the procedure and the sedation                            options and risks were discussed with the patient.                            All questions were answered, and informed consent                            was obtained. Prior Anticoagulants: The patient has                            taken Coumadin (warfarin), last dose was 5 days                            prior to procedure. ASA Grade Assessment: III - A                            patient with severe systemic disease. After                            reviewing the risks and benefits, the patient was                            deemed in satisfactory condition to undergo the                            procedure.  After obtaining informed consent, the colonoscope                            was passed under direct vision. Throughout the                            procedure, the patient's blood pressure, pulse, and                            oxygen saturations were monitored continuously. The                            Colonoscope was introduced through the anus and                            advanced to the the cecum, identified by   appendiceal orifice and ileocecal valve. The                            ileocecal valve, appendiceal orifice, and rectum                            were photographed. The quality of the bowel                            preparation was adequate. The colonoscopy was                            performed without difficulty. The patient tolerated                            the procedure well. Scope In: 11:25:10 AM Scope Out: 11:40:07 AM Scope Withdrawal Time: 0 hours 12 minutes 38 seconds  Total Procedure Duration: 0 hours 14 minutes 57 seconds  Findings:                 The perianal and digital rectal examinations were                            normal.                           Two sessile polyps were found in the sigmoid colon                            and descending colon. The polyps were 6 to 7 mm in                            size. These polyps were removed with a cold snare.                            Resection and retrieval were complete.                           One medium-sized localized angiodysplastic lesion  without bleeding was found in the ascending colon.                           Multiple small-mouthed diverticula were found in                            the left colon. There was no evidence of                            diverticular bleeding.                           The exam was otherwise without abnormality on                            direct and retroflexion views.                           Internal hemorrhoids were found during                            retroflexion. The hemorrhoids were small and Grade                            I (internal hemorrhoids that do not prolapse). Complications:            No immediate complications. Estimated blood loss:                            None. Estimated Blood Loss:     Estimated blood loss: none. Impression:               - Two 6 to 7 mm polyps in the sigmoid colon and in                             the descending colon, removed with a cold snare.                            Resected and retrieved.                           - One non-bleeding colonic angiodysplastic lesion.                           - Moderate diverticulosis in the left colon. There                            was no evidence of diverticular bleeding.                           - Internal hemorrhoids. Recommendation:           - Resume Coumadin (warfarin) tomorrow at prior                            dose. Refer to managing physician for further  adjustment of therapy.                           - Patient has a contact number available for                            emergencies. The signs and symptoms of potential                            delayed complications were discussed with the                            patient. Return to normal activities tomorrow.                            Written discharge instructions were provided to the                            patient.                           - Resume previous diet.                           - Continue present medications.                           - Await pathology results.                           - No repeat screening or surviellance colonoscopy                            due to age.                           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                           - Follow up with PCP for mgmt of anemia. Ladene Artist, MD 02/17/2017 11:45:58 AM This report has been signed electronically.

## 2017-02-18 ENCOUNTER — Telehealth: Payer: Self-pay | Admitting: *Deleted

## 2017-02-18 ENCOUNTER — Encounter (HOSPITAL_COMMUNITY): Payer: Medicare (Managed Care)

## 2017-02-18 NOTE — Telephone Encounter (Signed)
  Follow up Call-  Call back number 02/17/2017 08/06/2015  Post procedure Call Back phone  # [phone] Helene Kelp [phone]  Permission to leave phone message Yes Yes  Some recent data might be hidden     Patient questions:  Do you have a fever, pain , or abdominal swelling? No. Pain Score  0 *  Have you tolerated food without any problems? Yes.    Have you been able to return to your normal activities? Yes.    Do you have any questions about your discharge instructions: Diet   No. Medications  No. Follow up visit  No.  Do you have questions or concerns about your Care? No.  Actions: * If pain score is 4 or above: No action needed, pain <4. Spoke with a staff member at nursing home who says pt had no problems from or after procedure yesterday and is getting ready to go home

## 2017-02-21 ENCOUNTER — Ambulatory Visit (HOSPITAL_COMMUNITY)
Admission: RE | Admit: 2017-02-21 | Discharge: 2017-02-21 | Disposition: A | Discharge status: Home / Self Care | Payer: Medicare (Managed Care) | Source: Ambulatory Visit | Attending: Gastroenterology | Admitting: Gastroenterology

## 2017-02-21 DIAGNOSIS — D509 Iron deficiency anemia, unspecified: Secondary | ICD-10-CM | POA: Diagnosis present

## 2017-02-21 MED ORDER — SODIUM CHLORIDE 0.9 % IV SOLN
510.0000 mg | INTRAVENOUS | Status: AC
  Administered 2017-02-21: 510 mg via INTRAVENOUS
  Filled 2017-02-21: qty 17

## 2017-03-06 ENCOUNTER — Encounter: Payer: Self-pay | Admitting: Gastroenterology

## 2017-04-11 DIAGNOSIS — H401131 Primary open-angle glaucoma, bilateral, mild stage: Secondary | ICD-10-CM | POA: Insufficient documentation

## 2017-04-21 ENCOUNTER — Telehealth: Payer: Self-pay | Admitting: Gastroenterology

## 2017-04-21 NOTE — Telephone Encounter (Signed)
Patient reports abdominal pain.  She was started on TUMS TID by [facility].  She will come in and see Tye Savoy RNP on 05/04/17.  She needs a morning on a MWF only. She will call back if the TUMS helps and cancel appt.

## 2017-05-04 ENCOUNTER — Ambulatory Visit: Payer: Medicare (Managed Care) | Admitting: Nurse Practitioner

## 2017-08-15 DIAGNOSIS — I499 Cardiac arrhythmia, unspecified: Secondary | ICD-10-CM | POA: Insufficient documentation

## 2017-11-28 ENCOUNTER — Ambulatory Visit
Admission: RE | Admit: 2017-11-28 | Discharge: 2017-11-28 | Disposition: A | Discharge status: Home / Self Care | Payer: Medicare (Managed Care) | Source: Ambulatory Visit | Attending: Nurse Practitioner | Admitting: Nurse Practitioner

## 2017-11-28 ENCOUNTER — Other Ambulatory Visit: Payer: Self-pay | Admitting: Nurse Practitioner

## 2017-11-28 DIAGNOSIS — Z0000 Encounter for general adult medical examination without abnormal findings: Secondary | ICD-10-CM

## 2018-01-18 ENCOUNTER — Telehealth: Payer: Self-pay

## 2018-01-18 NOTE — Telephone Encounter (Addendum)
   [facility] Pre-operative Risk Assessment    Request for surgical clearance:  1. What type of surgery is being performed? 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 When is this surgery scheduled? 01/25/18   3. What type of clearance is required (medical clearance vs. Pharmacy clearance to hold med vs. Both)? Both  4. Are there any medications that need to be held prior to surgery and how long? Coumadin   5. Practice name and name of physician performing surgery? [facility]     6. What is your office phone number [phone]    7.   What is your office fax number [phone]  8.   Anesthesia type (None, local, MAC, general) ? MAC   Mendel Ryder 01/18/2018, 5:14 PM  _________________________________________________________________   (provider comments below)

## 2018-01-19 NOTE — Telephone Encounter (Signed)
Spoke with pt and she has been made aware that she needs an appt before she can be seen.  Pt has been scheduled to see Jory Sims, NP, 01/24/18. Pt thanked me for the call.   Will fwd to surgeons office so they can be made aware that clearance will be addressed at that visit.

## 2018-01-19 NOTE — Telephone Encounter (Signed)
   Primary Cardiologist:James Hochrein, MD  Chart reviewed as part of pre-operative protocol coverage. Because of ANJELA CASSARA past medical history and time since last visit, he/she will require a follow-up visit in order to better assess preoperative cardiovascular risk.  Her last office visit was nearly 2 years ago so we cannot assess preop clearance without appointment. Additionally, it does not appear we follow her Coumadin.  Pre-op covering staff: - Please schedule appointment and call patient to inform them. - Please contact requesting surgeon's office via preferred method (i.e, phone, fax) to inform them of need for appointment prior to surgery.  Charlie Pitter, PA-C  01/19/2018, 8:01 AM

## 2018-01-24 ENCOUNTER — Encounter: Payer: Self-pay | Admitting: Adult Health

## 2018-01-24 ENCOUNTER — Telehealth: Payer: Self-pay | Admitting: Pharmacist Clinician (PhC)/ Clinical Pharmacy Specialist

## 2018-01-24 ENCOUNTER — Ambulatory Visit (INDEPENDENT_AMBULATORY_CARE_PROVIDER_SITE_OTHER): Payer: Medicare (Managed Care) | Admitting: Adult Health

## 2018-01-24 VITALS — BP 161/78 | HR 70 | Ht 64.0 in | Wt 167.6 lb

## 2018-01-24 DIAGNOSIS — I480 Paroxysmal atrial fibrillation: Secondary | ICD-10-CM | POA: Diagnosis not present

## 2018-01-24 DIAGNOSIS — I10 Essential (primary) hypertension: Secondary | ICD-10-CM

## 2018-01-24 DIAGNOSIS — I639 Cerebral infarction, unspecified: Secondary | ICD-10-CM

## 2018-01-24 NOTE — Telephone Encounter (Signed)
Spoke with surgeon office regarding upcoming eyelid surgery.  We do not follow lINR for this patient, is followed by [facility].  Patient not sure when last INR or how often it is checked.  Surgeon office okay if patient does not hold warfarin.  They will still do procedure if patient anticoagulated.  Based on CHADS2-VASc score of 6 with history of stroke, she would need to bridged with enoxaparin if warfarin held.  Would be too complex to arrange bridging with [facility] and or our office due to transportation concerns.   Because of this I recommend that she not hold warfarin for procedure.   Patient aware of our recommendation, as is Psychologist, sport and exercise.

## 2018-01-24 NOTE — Telephone Encounter (Signed)
Per our pharmacy protocol, holding coumadin is not recommneded for this procedure. Please see office visit providing clearance and recommendations forthcoming.   [name], AGNP-[facility] 01/24/2018  2:47 PM

## 2018-01-24 NOTE — Patient Instructions (Signed)
  Medication Instructions:  NO CHANGES- Your physician recommends that you continue on your current medications as directed. Please refer to the Current Medication list given to you today.  If you need a refill on your cardiac medications before your next appointment, please call your pharmacy.  Special Instructions: We don't recommend holding for eyelid procedure.  If surgeon wants to hold, she will need to be bridged with lovenox.  We don't follow her, so you'll need to contact whomever follows your coumadin.  Follow-Up: Your physician wants you to follow-up in: 3-6 MONTHS WITH DR [place].  Thank you for choosing [facility]!!

## 2018-01-24 NOTE — Progress Notes (Signed)
Cardiology Office Note   Date:  07/27/8784   ID:  Stacey Davenport 7/67/2095, MRN [id number]  PCP:  Janifer Adie, MD  Cardiologist:  [facility]  Chief Complaint  Patient presents with  . Medical Clearance     History of Present Illness: Stacey Davenport is a 77 y.o. 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  She was planned to have the surgery which would include incisions to the eyelid bilaterally, but has not been told to hold coumadin by the surgeon. She denies chest pain, rapid HR or palpitation dyspnea or dizziness. She is medically compliant.   Past Medical History:  Diagnosis Date  . Anxiety   . Asthma   . Atrial fibrillation ([place])   . CVA (cerebral infarction)    a. 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 Esophageal stricture   . Gastric polyp    Hyperplastic  . Gastritis   . GERD (gastroesophageal reflux disease)   . Glaucoma   . Hiatal hernia   . Hyperlipidemia   . Hypertension   . Hypothyroidism   . Osteoarthritis   . Pneumonia   . Rheumatoid arthritis(714.0)   . Thyroid disease     Past Surgical History:  Procedure Laterality Date  . APPENDECTOMY    . BLEPHAROPLASTY Right   . CATARACT EXTRACTION Bilateral   . CHOLECYSTECTOMY    . TUBAL LIGATION       Current Outpatient Medications  Medication Sig Dispense Refill  . amiodarone (PACERONE) 200 MG tablet Take 1 tablet (200 mg total) by mouth daily. 30 tablet 11  . brimonidine (ALPHAGAN) 0.2 % ophthalmic solution USE 1 DROP IN EACH EYE 3 TIMES DAILY 10 mL 0  . calcium carbonate (TUMS  - DOSED IN MG ELEMENTAL CALCIUM) 500 MG chewable tablet Chew 1 tablet by mouth daily.    . calcium-vitamin D (OSCAL) 250-125 MG-UNIT per tablet Take 1 tablet by mouth daily.      . cholecalciferol (VITAMIN D) 1000 UNITS tablet Take 2,000 Units by mouth daily.    . ciclesonide (ALVESCO) 160 MCG/ACT inhaler Inhale 1 puff into the lungs 2 (two) times daily. (Patient taking differently: Inhale 2 puffs into the lungs 2 (two) times daily. ) 1 Inhaler 2  . diltiazem (TIAZAC) 180 MG 24 hr capsule Take 1 capsule (180 mg total) by mouth 2 (two) times daily. Take one tablet by mouth in the evening 60 capsule 11  . fluticasone (FLONASE) 50 MCG/ACT nasal spray Place into both nostrils daily.    Marland Kitchen latanoprost (XALATAN) 0.005 % ophthalmic solution PLACE 1 DROP IN EACH EYE AT BEDTIME 2.5 mL 0  . levalbuterol (XOPENEX) 0.63 MG/3ML nebulizer solution Take 0.63 mg by nebulization every 8 (eight) hours as needed for wheezing or shortness of breath.    . levothyroxine (SYNTHROID, LEVOTHROID) 75 MCG tablet Take 75 mcg by mouth daily before breakfast. Take once daily    . LORazepam (ATIVAN) 0.5 MG tablet TAKE 1 TO 2 TABLETS EVERY DAY 60 tablet 1  . losartan (COZAAR) 25 MG tablet Take 25 mg by mouth daily.    . montelukast (SINGULAIR) 10 MG tablet Take 1 tablet by mouth every evening.    Marland Kitchen  predniSONE (DELTASONE) 5 MG tablet TAKE ONE (1) TABLET EACH DAY 10 tablet 0  . Sodium Fluoride (PREVIDENT 5000 BOOSTER) 1.1 % PSTE Place onto teeth 2 (two) times daily.    . traMADol (ULTRAM) 50 MG tablet Take 25 mg by mouth every 12 (twelve) hours as needed.    Penne Lash HFA 45 MCG/ACT inhaler USE 2 PUFFS TWICE DAILY 15 g 2  . warfarin (COUMADIN) 4 MG tablet Take 2 mg by mouth as directed. Take 4 mg Monday, Thursday. Take 2 mg Sunday, Tuesday, Wednesday, Friday and Saturday     Current Facility-Administered Medications  Medication Dose Route Frequency Provider Last Rate Last Dose  . 0.9 %  sodium chloride infusion  500 mL Intravenous  Continuous Ladene Artist, MD        Allergies:   Aciphex [rabeprazole sodium]; Amoxicillin; Biaxin [clarithromycin]; Cephalexin; Esomeprazole magnesium; Hydrocodone; Hydrocodone-acetaminophen; Ketek [telithromycin]; Latex; Levaquin [levofloxacin in d5w]; Lipitor [atorvastatin]; Macrobid [nitrofurantoin macrocrystal]; Moxifloxacin; Nexium [esomeprazole magnesium]; Rabeprazole sodium; Sulfonamide derivatives; Vesicare [solifenacin]; and Zithromax [azithromycin]    Social History:  The patient  reports that she has never smoked. She has never used smokeless tobacco. She reports that she does not drink alcohol or use drugs.   Family History:  The patient's family history includes Cancer in her father; Diabetes in her mother and sister; Heart disease in her sister.    ROS: All other systems are reviewed and negative. Unless otherwise mentioned in H&P    PHYSICAL EXAM: VS:  BP (!) 161/78   Pulse 70   Ht 5\' 4"  (1.626 m)   Wt 167 lb 9.6 oz (76 kg)   BMI 28.77 kg/m  , BMI Body mass index is 28.77 kg/m. GEN: Well nourished, well developed, in no acute distress  HEENT: normal  Neck: no JVD, carotid bruits, or masses Cardiac: RRR; no murmurs, rubs, or gallops,no edema  Respiratory:  Clear to auscultation bilaterally, normal work of breathing GI: soft, nontender, nondistended, + BS MS: no deformity or atrophy Scoliosis with kyphosis.  Skin: warm and dry, no rash Neuro:  Strength and sensation are intact Psych: euthymic mood, full affect   EKG:  NSR rate of 64 bpm, non-specific anterior changes. Unchanged from prior EKG on 03/17/2016.   Recent Labs: No results found for requested labs within last 8760 hours.    Lipid Panel    Component Value Date/Time   CHOL 175 10/16/2013 1142   CHOL 198 12/06/2012 1040   TRIG 81 10/16/2013 1142   TRIG 94 12/06/2012 1040   HDL 69 10/16/2013 1142   HDL 88 12/06/2012 1040   CHOLHDL 1.8 09/30/2011 1610   VLDL 16 09/30/2011 1610   LDLCALC 90  10/16/2013 1142   LDLCALC 91 12/06/2012 1040      Wt Readings from Last 3 Encounters:  01/24/18 167 lb 9.6 oz (76 kg)  02/21/17 180 lb (81.6 kg)  02/17/17 180 lb (81.6 kg)      Other studies Reviewed: Echocardiogram: 09/30/2017 Left ventricle: The cavity size was normal. Wall thickness was increased in a pattern of mild LVH. Systolic function was vigorous. The estimated ejection fraction was in the range of 65% to 70%. Wall motion was normal; there were no regional wall motion abnormalities. Doppler parameters are consistent with abnormal left ventricular relaxation (grade 1 diastolic dysfunction). - Mitral valve: Trivial regurgitation. - Tricuspid valve: Trivial regurgitation. - Inferior vena cava: The vessel was dilated. - Pericardium, extracardiac: A small pericardial effusion was identified circumferential to the heart with  evidence of organization anteriorly.   ASSESSMENT AND PLAN:  1. Atrial fib: She is currently in NSR, remains on coumadin. She denies bleeding or hemoptysis. She will remain on diltiazem and amiodarone. Coumadin is monitored by [facility].   2. Pre-Operative Cardiac Clearance:  She is cleared from cardiac standpoint to be low risk to proceed with eyelid surgical repair. I recommend holding coumadin because of incisions required. However, pharmacy will see her to make recommendations. She was to have surgery tomorrow morning but this has bene rescheduled for February 01, 2018 so allow for coumadin to be held if this is necessary. She will meet with pharmacy today.  3. Hx of CVA: Non-hemorrhagic. No focal deficits.   4. Hypertension: BP is elevated today but she was late getting here due to transportation being late.  No changes in her antihypertensive regimen.   Current medicines are reviewed at length with the patient today.    Labs/ tests ordered today include: None   Phill Myron. West Pugh, ANP, AACC   01/24/2018 1:57 PM    [facility] [address], [place], [place] [address] Phone: [phone]; Fax: [phone]

## 2018-05-08 ENCOUNTER — Ambulatory Visit: Payer: Medicaid Other | Admitting: Cardiology

## 2018-05-23 NOTE — Progress Notes (Signed)
Cardiology Office Note   Date:  78/24/2353   ID:  [name], [name] [birth year], MRN [id number]  PCP:  Janifer Adie, MD  Cardiologist:   Minus Breeding, MD    Chief Complaint  Patient presents with  . Atrial Fibrillation      History of Present Illness: Stacey Davenport is a 77 y.o. female who presents for followup of atrial fib.  Since I last saw her she has done relatively well.  She gets around slowly with a walker. The patient denies any new symptoms such as chest discomfort, neck or arm discomfort. There has been no new shortness of breath, PND or orthopnea. There have been no reported palpitations, presyncope or syncope.    Past Medical History:  Diagnosis Date  . Anxiety   . Asthma   . Atrial fibrillation ([place])   . CVA (cerebral infarction)    a. 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 Esophageal stricture   . Gastric polyp    Hyperplastic  . Gastritis   . GERD (gastroesophageal reflux disease)   . Glaucoma   . Hiatal hernia   . Hyperlipidemia   . Hypertension   . Hypothyroidism   . Osteoarthritis   . Pneumonia   . Rheumatoid arthritis(714.0)   . Thyroid disease     Past Surgical History:  Procedure Laterality Date  . APPENDECTOMY    . BLEPHAROPLASTY Right   . CATARACT EXTRACTION Bilateral   . CHOLECYSTECTOMY    . TUBAL LIGATION       Current Outpatient Medications  Medication Sig Dispense Refill  . amiodarone (PACERONE) 200 MG tablet Take 1 tablet (200 mg total) by mouth daily. 30 tablet 11  . brimonidine (ALPHAGAN) 0.2 % ophthalmic solution USE 1 DROP IN EACH EYE 3 TIMES DAILY 10 mL 0  . calcium carbonate (TUMS - DOSED IN MG ELEMENTAL CALCIUM) 500 MG chewable tablet Chew 1 tablet by mouth daily.    . cholecalciferol (VITAMIN D) 1000 UNITS tablet Take 1,000 Units by mouth daily.     . ciclesonide (ALVESCO) 160 MCG/ACT inhaler Inhale 1 puff into the lungs 2 (two) times daily. (Patient taking differently:  Inhale 2 puffs into the lungs 2 (two) times daily. ) 1 Inhaler 2  . diltiazem (TIAZAC) 180 MG 24 hr capsule Take 1 capsule (180 mg total) by mouth 2 (two) times daily. Take one tablet by mouth in the evening 60 capsule 11  . fexofenadine (HM FEXOFENADINE HCL) 180 MG tablet Take 180 mg by mouth daily.    . fluticasone (FLONASE) 50 MCG/ACT nasal spray Place into both nostrils daily.    Marland Kitchen latanoprost (XALATAN) 0.005 % ophthalmic solution PLACE 1 DROP IN EACH EYE AT BEDTIME 2.5 mL 0  . levalbuterol (XOPENEX) 0.63 MG/3ML nebulizer solution Take 0.63 mg by nebulization every 8 (eight) hours as needed for wheezing or shortness of breath.    . levothyroxine (SYNTHROID, LEVOTHROID) 75 MCG tablet Take 75 mcg by mouth daily before breakfast. Take once daily    . LORazepam (ATIVAN) 0.5 MG tablet TAKE 1 TO 2 TABLETS EVERY DAY 60 tablet 1  . losartan (COZAAR) 25 MG tablet Take 25 mg by mouth daily. Take 1/2 tablet by mouth daily    . montelukast (SINGULAIR) 10 MG tablet Take 1 tablet by mouth every evening.    . predniSONE (DELTASONE) 5 MG tablet Take 5 mg by mouth. Take 1 1/2 tablet by mouth daily    .  Sodium Fluoride (PREVIDENT 5000 BOOSTER) 1.1 % PSTE Place onto teeth 2 (two) times daily.    . traMADol (ULTRAM) 50 MG tablet Take 25 mg by mouth every 12 (twelve) hours as needed.    . warfarin (COUMADIN) 4 MG tablet Take 2 mg by mouth as directed. Take 4 mg Monday, Thursday. Take 2 mg Sunday, Tuesday, Wednesday, Friday and Saturday    . XOPENEX HFA 45 MCG/ACT inhaler USE 2 PUFFS TWICE DAILY 15 g 2   No current facility-administered medications for this visit.     Allergies:   Aciphex [rabeprazole sodium]; Amoxicillin; Biaxin [clarithromycin]; Cephalexin; Esomeprazole magnesium; Hydrocodone; Hydrocodone-acetaminophen; Ketek [telithromycin]; Latex; Levaquin [levofloxacin in d5w]; Lipitor [atorvastatin]; Macrobid [nitrofurantoin macrocrystal]; Moxifloxacin; Nexium [esomeprazole magnesium]; Rabeprazole sodium;  Sulfonamide derivatives; Vesicare [solifenacin]; and Zithromax [azithromycin]     ROS:  Please see the history of present illness.   Otherwise, review of systems are positive for none.   All other systems are reviewed and negative.    PHYSICAL EXAM: VS:  BP 118/66   Pulse 62   Ht 5\' 4"  (1.626 m)   Wt 166 lb (75.3 kg)   BMI 28.49 kg/m  , BMI Body mass index is 28.49 kg/m. GEN:  No distress NECK:  No jugular venous distention at 90 degrees, waveform within normal limits, carotid upstroke brisk and symmetric, no bruits, no thyromegaly LYMPHATICS:  No cervical adenopathy LUNGS:  Clear to auscultation bilaterally BACK:  No CVA tenderness CHEST:  Unremarkable HEART:  S1 and S2 within normal limits, no S3, no S4, no clicks, no rubs, 2 out of 6 apical brief systolic murmur radiating slightly at the aortic outflow tract, no diastolic murmurs ABD:  Positive bowel sounds normal in frequency in pitch, no bruits, no rebound, no guarding, unable to assess midline mass or bruit with the patient seated. EXT:  2 plus pulses throughout, trace edema, no cyanosis no clubbing SKIN:  No rashes no nodules NEURO:  Cranial nerves II through XII grossly intact, motor grossly intact throughout PSYCH:  Cognitively intact, oriented to person place and time     EKG:  EKG is ordered today. The ekg ordered today demonstrates sinus rhythm, rate 62, axis within normal limits, intervals within normal limits, poor anterior R wave progression.   Recent Labs: No results found for requested labs within last 8760 hours.    Lipid Panel    Component Value Date/Time   CHOL 175 10/16/2013 1142   CHOL 198 12/06/2012 1040   TRIG 81 10/16/2013 1142   TRIG 94 12/06/2012 1040   HDL 69 10/16/2013 1142   HDL 88 12/06/2012 1040   CHOLHDL 1.8 09/30/2011 1610   VLDL 16 09/30/2011 1610   LDLCALC 90 10/16/2013 1142   LDLCALC 91 12/06/2012 1040      Wt Readings from Last 3 Encounters:  05/24/18 166 lb (75.3 kg)    01/24/18 167 lb 9.6 oz (76 kg)  02/21/17 180 lb (81.6 kg)      Other studies Reviewed: Additional studies/ records that were reviewed today include: None. Review of the above records demonstrates:  Please see elsewhere in the note.     ASSESSMENT AND PLAN:  ATRIAL FIB:   The patient tolerates anticoagulation.  She seems to be maintaining sinus rhythm on amiodarone.  I was able to review labs from earlier this year and her TSH and liver profile are normal no change in therapy.  HTN: Blood pressures well controlled.  She will continue the meds as listed.  Current  medicines are reviewed at length with the patient today.  The patient does not have concerns regarding medicines.  The following changes have been made:  no change  Labs/ tests ordered today include: None  Orders Placed This Encounter  Procedures  . EKG 12-Lead     Disposition:   FU with me in one year.     Signed, Minus Breeding, MD  05/24/2018 12:33 PM    [facility]

## 2018-05-24 ENCOUNTER — Encounter: Payer: Self-pay | Admitting: Cardiology

## 2018-05-24 ENCOUNTER — Ambulatory Visit (INDEPENDENT_AMBULATORY_CARE_PROVIDER_SITE_OTHER): Payer: Medicare (Managed Care) | Admitting: Cardiology

## 2018-05-24 VITALS — BP 118/66 | HR 62 | Ht 64.0 in | Wt 166.0 lb

## 2018-05-24 DIAGNOSIS — I10 Essential (primary) hypertension: Secondary | ICD-10-CM

## 2018-05-24 DIAGNOSIS — I480 Paroxysmal atrial fibrillation: Secondary | ICD-10-CM

## 2018-05-24 NOTE — Patient Instructions (Signed)
Medication Instructions:  The current medical regimen is effective;  continue present plan and medications.  If you need a refill on your cardiac medications before your next appointment, please call your pharmacy.   Follow-Up: Follow up in 1 year with Dr. Hochrein.  You will receive a letter in the mail 2 months before you are due.  Please call us when you receive this letter to schedule your follow up appointment.  Thank you for choosing [facility]!!     

## 2018-09-11 ENCOUNTER — Telehealth: Payer: Self-pay | Admitting: *Deleted

## 2018-09-11 NOTE — Telephone Encounter (Signed)
   [facility] Pre-operative Risk Assessment    Request for surgical clearance:  1. What type of surgery is being performed? RIGHT SLINGREMOVAL AND POSSIBLE SCAR REMOVAL OF UPPER EYELID   2. When is this surgery scheduled? 09/26/18   3. What type of clearance is required (medical clearance vs. Pharmacy clearance to hold med vs. Both)? BOTH  4. Are there any medications that need to be held prior to surgery and how long?WARFARIN   5. Practice name and name of physician performing surgery? [facility]; DR. Elayne Snare ZALDIVAR   6. What is your office phone number [phone]    7.   What is your office fax number [phone]  8.   Anesthesia type (None, local, MAC, general) ? MAC   Julaine Hua 09/11/2018, 2:41 PM  _________________________________________________________________   (provider comments below)

## 2018-09-11 NOTE — Telephone Encounter (Signed)
   Primary Cardiologist: Minus Breeding, MD  Chart reviewed as part of pre-operative protocol coverage. Given past medical history and time since last visit, based on ACC/AHA guidelines, Stacey Davenport would be at acceptable risk for the planned procedure without further cardiovascular testing. Patient remained active at [facility] and easily getting > 4 mets of activity.   I will route this recommendation to the requesting party via Epic fax function and remove from pre-op pool.  Please call with questions.  [place], [place] 09/11/2018, 3:08 PM

## 2018-09-11 NOTE — Telephone Encounter (Signed)
Previously it was discussed that pt would not need to hold warfarin for a procedure in similar location. (see phone note from (01/25/19). Pt is Chadsvasc of at least 6 (agex2, HTN, CVA, female) and would need lovenox bridging if warfarin needs to be held.   Spoke with Apolonio Schneiders at [facility] office and they are fine with pt remaining on therapeutic anticoagulation, but need to note on clearance.   Recommend pt continue on warfarin therapy around procedure. Will route to Preop pool to be included on medical clearance.

## 2019-03-05 ENCOUNTER — Ambulatory Visit: Payer: Medicare (Managed Care) | Admitting: Gastroenterology

## 2019-03-27 ENCOUNTER — Ambulatory Visit: Payer: Medicare (Managed Care) | Admitting: Gastroenterology

## 2019-05-01 ENCOUNTER — Ambulatory Visit: Payer: Medicare (Managed Care) | Admitting: Gastroenterology

## 2019-05-14 NOTE — Progress Notes (Signed)
Virtual Visit via Telephone Note   This visit type was conducted due to national recommendations for restrictions regarding the COVID-19 Pandemic (e.g. social distancing) in an effort to limit this patient's exposure and mitigate transmission in our community.  Due to her co-morbid illnesses, this patient is at least at moderate risk for complications without adequate follow up.  This format is felt to be most appropriate for this patient at this time.  The patient did not have access to video technology/had technical difficulties with video requiring transitioning to audio format only (telephone).  All issues noted in this document were discussed and addressed.  No physical exam could be performed with this format.  Please refer to the patient's chart for her  consent to telehealth for [facility].   Date:  [id number]   ID:  Stacey Davenport, Stacey Davenport [id number], MRN [id number]  Patient Location: Home Provider Location: Home  PCP:  Janifer Adie, MD  Cardiologist:  Minus Breeding, MD  Electrophysiologist:  None   Evaluation Performed:  Follow-Up Visit  Chief Complaint:  Atrial fib  History of Present Illness:    Stacey Davenport is a 78 y.o. female who presents for followup of atrial fib.  During the last EKG the patient was in NSR.  Since I last saw her she gets around slowly.  She lives in an apartment and she uses a walker.  The patient denies any new symptoms such as chest discomfort, neck or arm discomfort. There has been no new shortness of breath, PND or orthopnea. She notices rare palpitations.  She thinks it is because of stress or anxiety.  She has had no prolonged symptoms.    The patient does not have symptoms concerning for COVID-19 infection (fever, chills, cough, or new shortness of breath).    Past Medical History:  Diagnosis Date  . Anxiety   . Asthma   . Atrial fibrillation ([place])   . CVA (cerebral infarction)    a. 09/2011 MRI tiny bilat centrum semiovale acute non  hemorrhagic infarcts  . Esophageal stricture   . Gastric polyp    Hyperplastic  . Gastritis   . GERD (gastroesophageal reflux disease)   . Glaucoma   . Hiatal hernia   . Hyperlipidemia   . Hypertension   . Hypothyroidism   . Osteoarthritis   . Pneumonia   . Rheumatoid arthritis(714.0)   . Thyroid disease    Past Surgical History:  Procedure Laterality Date  . APPENDECTOMY    . BLEPHAROPLASTY Right   . CATARACT EXTRACTION Bilateral   . CHOLECYSTECTOMY    . TUBAL LIGATION       Current Meds  Medication Sig  . amiodarone (PACERONE) 200 MG tablet Take 1 tablet (200 mg total) by mouth daily.  . brimonidine (ALPHAGAN) 0.2 % ophthalmic solution USE 1 DROP IN EACH EYE 3 TIMES DAILY  . calcium carbonate (TUMS - DOSED IN MG ELEMENTAL CALCIUM) 500 MG chewable tablet Chew 1 tablet by mouth as needed.   . cetirizine (ZYRTEC) 10 MG tablet Take 10 mg by mouth daily.  . cholecalciferol (VITAMIN D) 1000 UNITS tablet Take 1,000 Units by mouth daily.   . ciclesonide (ALVESCO) 160 MCG/ACT inhaler Inhale 1 puff into the lungs 2 (two) times daily. (Patient taking differently: Inhale 2 puffs into the lungs 2 (two) times daily. )  . diltiazem (TIAZAC) 180 MG 24 hr capsule Take 1 capsule (180 mg total) by mouth 2 (two) times daily. Take one tablet by mouth  in the evening  . fluticasone (FLONASE) 50 MCG/ACT nasal spray Place into both nostrils daily.  Marland Kitchen latanoprost (XALATAN) 0.005 % ophthalmic solution PLACE 1 DROP IN EACH EYE AT BEDTIME  . levalbuterol (XOPENEX) 0.63 MG/3ML nebulizer solution Take 0.63 mg by nebulization every 8 (eight) hours as needed for wheezing or shortness of breath.  . levothyroxine (SYNTHROID, LEVOTHROID) 75 MCG tablet Take 75 mcg by mouth daily before breakfast. Take once daily  . LORazepam (ATIVAN) 0.5 MG tablet TAKE 1 TO 2 TABLETS EVERY DAY  . losartan (COZAAR) 25 MG tablet Take 25 mg by mouth daily. Take 1/2 tablet by mouth daily  . montelukast (SINGULAIR) 10 MG tablet Take  1 tablet by mouth every evening.  . predniSONE (DELTASONE) 5 MG tablet Take 5 mg by mouth. Take 1 1/2 tablet by mouth daily  . traMADol (ULTRAM) 50 MG tablet Take 25 mg by mouth every 12 (twelve) hours as needed.  . warfarin (COUMADIN) 4 MG tablet Take 2 mg by mouth as directed. Take 2 mg Sunday and Thursday. Take 1.5  mg monday, Tuesday, Wednesday, Friday and Saturday  . XOPENEX HFA 45 MCG/ACT inhaler USE 2 PUFFS TWICE DAILY     Allergies:   Aciphex [rabeprazole sodium], Amoxicillin, Biaxin [clarithromycin], Cephalexin, Esomeprazole magnesium, Hydrocodone, Hydrocodone-acetaminophen, Ketek [telithromycin], Latex, Levaquin [levofloxacin in d5w], Lipitor [atorvastatin], Macrobid [nitrofurantoin macrocrystal], Moxifloxacin, Nexium [esomeprazole magnesium], Rabeprazole sodium, Sulfonamide derivatives, Vesicare [solifenacin], and Zithromax [azithromycin]   Social History   Tobacco Use  . Smoking status: Never Smoker  . Smokeless tobacco: Never Used  Substance Use Topics  . Alcohol use: No    Alcohol/week: 0.0 standard drinks  . Drug use: No     Family Hx: The patient's family history includes Cancer in her father; Diabetes in her mother and sister; Heart disease in her sister. There is no history of Colon cancer or Esophageal cancer.  ROS:   Please see the history of present illness.    All other systems reviewed and are negative.   Prior CV studies:   The following studies were reviewed today:  None  Labs/Other Tests and Data Reviewed:    EKG:  No ECG reviewed.  Recent Labs: No results found for requested labs within last 8760 hours.   Recent Lipid Panel Lab Results  Component Value Date/Time   CHOL 175 10/16/2013 11:42 AM   CHOL 198 12/06/2012 10:40 AM   TRIG 81 10/16/2013 11:42 AM   TRIG 94 12/06/2012 10:40 AM   HDL 69 10/16/2013 11:42 AM   HDL 88 12/06/2012 10:40 AM   CHOLHDL 1.8 09/30/2011 04:10 PM   LDLCALC 90 10/16/2013 11:42 AM   LDLCALC 91 12/06/2012 10:40 AM     Wt Readings from Last 3 Encounters:  05/24/18 166 lb (75.3 kg)  01/24/18 167 lb 9.6 oz (76 kg)  02/21/17 180 lb (81.6 kg)     Objective:    Vital Signs:  Ht 5\' 4"  (1.626 m)   BMI 28.49 kg/m    None  ASSESSMENT & PLAN:     ATRIAL FIB:    Ms. CEDAR GIGLIO has a 123456 - VASc score of 6.  She was in sinus when I saw her last.  I suspect she might have paroxysms although mild.  The patient  tolerates anticoagulation. We will continue with the meds as listed.  She has blood work ordered by her PCP next month.   HTN:   It was well controlled at [facility]. No change in therapy.  COVID-19 Education: The signs and symptoms of COVID-19 were discussed with the patient and how to seek care for testing (follow up with PCP or arrange E-visit).  The importance of social distancing was discussed today.  Time:   Today, I have spent 16 minutes with the patient with telehealth technology discussing the above problems.     Medication Adjustments/Labs and Tests Ordered: Current medicines are reviewed at length with the patient today.  Concerns regarding medicines are outlined above.   Tests Ordered: No orders of the defined types were placed in this encounter.   Medication Changes: No orders of the defined types were placed in this encounter.   Follow Up:  In Person in [place] in one year.  Signed, Minus Breeding, MD  05/15/2019 10:34 AM    [facility]

## 2019-05-15 ENCOUNTER — Encounter: Payer: Self-pay | Admitting: Cardiology

## 2019-05-15 ENCOUNTER — Telehealth (INDEPENDENT_AMBULATORY_CARE_PROVIDER_SITE_OTHER): Payer: Medicare (Managed Care) | Admitting: Cardiology

## 2019-05-15 VITALS — Ht 64.0 in

## 2019-05-15 DIAGNOSIS — I480 Paroxysmal atrial fibrillation: Secondary | ICD-10-CM

## 2019-05-15 DIAGNOSIS — I10 Essential (primary) hypertension: Secondary | ICD-10-CM | POA: Diagnosis not present

## 2019-05-15 NOTE — Patient Instructions (Addendum)
Medication Instructions:  Your physician recommends that you continue on your current medications as directed. Please refer to the Current Medication list given to you today.  *If you need a refill on your cardiac medications before your next appointment, please call your pharmacy*  Lab Work: NONE   Testing/Procedures: NONE   Follow-Up: At [facility], you and your health needs are our priority.  As part of our continuing mission to provide you with exceptional heart care, we have created designated Provider Care Teams.  These Care Teams include your primary Cardiologist (physician) and Advanced Practice Providers (APPs -  Physician Assistants and Nurse Practitioners) who all work together to provide you with the care you need, when you need it.  Your next appointment:   12 months You will receive a reminder letter in the mail two months in advance. If you don't receive a letter, please call our office to schedule the follow-up appointment.   The format for your next appointment:   In Person  Provider:   You may see Minus Breeding, MD or one of the following Advanced Practice Providers on your designated Care Team:    Rosaria Ferries, PA-C  Jory Sims, DNP, ANP  Cadence Kathlen Mody, NP

## 2019-05-18 ENCOUNTER — Other Ambulatory Visit: Payer: Self-pay | Admitting: Family Medicine

## 2019-05-18 ENCOUNTER — Ambulatory Visit
Admission: RE | Admit: 2019-05-18 | Discharge: 2019-05-18 | Disposition: A | Discharge status: Home / Self Care | Payer: Medicare (Managed Care) | Source: Ambulatory Visit | Attending: Family Medicine | Admitting: Family Medicine

## 2019-05-18 DIAGNOSIS — Z79899 Other long term (current) drug therapy: Secondary | ICD-10-CM

## 2019-05-24 ENCOUNTER — Ambulatory Visit: Payer: Medicare (Managed Care) | Admitting: Podiatry

## 2019-06-05 ENCOUNTER — Ambulatory Visit: Payer: Medicare (Managed Care) | Admitting: Gastroenterology

## 2019-07-09 ENCOUNTER — Ambulatory Visit: Payer: Medicare (Managed Care) | Admitting: Podiatry

## 2019-09-18 ENCOUNTER — Ambulatory Visit: Payer: Medicare (Managed Care) | Admitting: Sports Medicine

## 2019-09-25 ENCOUNTER — Ambulatory Visit: Payer: Medicare (Managed Care) | Admitting: Sports Medicine

## 2019-10-02 ENCOUNTER — Ambulatory Visit (INDEPENDENT_AMBULATORY_CARE_PROVIDER_SITE_OTHER): Payer: Medicare (Managed Care) | Admitting: Sports Medicine

## 2019-10-02 ENCOUNTER — Other Ambulatory Visit: Payer: Self-pay

## 2019-10-02 ENCOUNTER — Encounter: Payer: Self-pay | Admitting: Sports Medicine

## 2019-10-02 VITALS — BP 188/79 | HR 67

## 2019-10-02 DIAGNOSIS — M79675 Pain in left toe(s): Secondary | ICD-10-CM

## 2019-10-02 DIAGNOSIS — L84 Corns and callosities: Secondary | ICD-10-CM | POA: Diagnosis not present

## 2019-10-02 DIAGNOSIS — Z7901 Long term (current) use of anticoagulants: Secondary | ICD-10-CM | POA: Diagnosis not present

## 2019-10-02 DIAGNOSIS — I739 Peripheral vascular disease, unspecified: Secondary | ICD-10-CM | POA: Diagnosis not present

## 2019-10-02 DIAGNOSIS — B351 Tinea unguium: Secondary | ICD-10-CM | POA: Diagnosis not present

## 2019-10-02 DIAGNOSIS — M79674 Pain in right toe(s): Secondary | ICD-10-CM

## 2019-10-02 NOTE — Progress Notes (Signed)
Subjective: Stacey Davenport is a 79 y.o. female patient seen today in office with complaint of mildly painful thickened and elongated toenails; unable to trim. Patient denies history of Diabetes and Neuropathy, but does admit history of Vascular disease on coumadin. Patient has no other pedal complaints at this time.   Review of Systems  All other systems reviewed and are negative.    Patient Active Problem List   Diagnosis Date Noted  . High risk medication use 09/03/2013  . Multiple drug allergies 09/03/2013  . 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 Hypothyroidism 04/23/2013  . Hyperlipidemia 12/06/2012  . CVA History 12/06/2012  . Atrial fibrillation ([place]) 09/30/2011  . Hypertension 08/20/2009  . Glaucoma 04/17/2009  . ASTHMA 04/17/2009  . ESOPHAGEAL STRICTURE 04/17/2009  . GERD 04/17/2009  . Rheumatoid arthritis(714.0) 04/17/2009    Current Outpatient Medications on File Prior to Visit  Medication Sig Dispense Refill  . amiodarone (PACERONE) 200 MG tablet Take 1 tablet (200 mg total) by mouth daily. 30 tablet 11  . brimonidine (ALPHAGAN) 0.2 % ophthalmic solution USE 1 DROP IN EACH EYE 3 TIMES DAILY 10 mL 0  . calcium carbonate (TUMS - DOSED IN MG ELEMENTAL CALCIUM) 500 MG chewable tablet Chew 1 tablet by mouth as needed.     . cetirizine (ZYRTEC) 10 MG tablet Take 10 mg by mouth daily.    . cholecalciferol (VITAMIN D) 1000 UNITS tablet Take 1,000 Units by mouth daily.     . ciclesonide (ALVESCO) 160 MCG/ACT inhaler Inhale 1 puff into the lungs 2 (two) times daily.    Marland Kitchen diltiazem (TIAZAC) 180 MG 24 hr capsule Take 1 capsule (180 mg total) by mouth 2 (two) times daily. Take one tablet by mouth in the evening 60 capsule 11  . fluticasone (FLONASE) 50 MCG/ACT nasal spray Place into both nostrils daily.    Marland Kitchen latanoprost (XALATAN) 0.005 % ophthalmic solution PLACE 1 DROP IN EACH EYE AT BEDTIME 2.5 mL 0  . levalbuterol (XOPENEX) 0.63 MG/3ML nebulizer solution Take 0.63 mg by  nebulization every 8 (eight) hours as needed for wheezing or shortness of breath.    . levothyroxine (SYNTHROID, LEVOTHROID) 75 MCG tablet Take 75 mcg by mouth daily before breakfast. Take once daily    . LORazepam (ATIVAN) 0.5 MG tablet TAKE 1 TO 2 TABLETS EVERY DAY 60 tablet 1  . losartan (COZAAR) 25 MG tablet Take 25 mg by mouth daily. Take 1/2 tablet by mouth daily    . montelukast (SINGULAIR) 10 MG tablet Take 1 tablet by mouth every evening.    . predniSONE (DELTASONE) 5 MG tablet Take 5 mg by mouth. Take 1 1/2 tablet by mouth daily    . sertraline (ZOLOFT) 25 MG tablet Take 25 mg by mouth daily.    Penne Lash HFA 45 MCG/ACT inhaler USE 2 PUFFS TWICE DAILY 15 g 2  . warfarin (COUMADIN) 4 MG tablet Take 2 mg by mouth as directed. Take 2 mg Sunday and Thursday. Take 1.5  mg monday, Tuesday, Wednesday, Friday and Saturday    . [DISCONTINUED] potassium chloride (K-DUR,KLOR-CON) 10 MEQ tablet Take 1 tablet (10 mEq total) by mouth daily. 30 tablet 11   No current facility-administered medications on file prior to visit.    Allergies  Allergen Reactions  . Aciphex [Rabeprazole Sodium]   . Amoxicillin   . Biaxin [Clarithromycin] Hives and Itching  . Cephalexin   . Esomeprazole Magnesium     Takes Dexilant at home  . Hydrocodone   .  Hydrocodone-Acetaminophen   . Ketek [Telithromycin]   . Latex   . Levaquin [Levofloxacin In D5w]   . Lipitor [Atorvastatin]   . Macrobid [Nitrofurantoin Macrocrystal]   . Moxifloxacin   . Nexium [Esomeprazole Magnesium]   . Rabeprazole Sodium     Takes Dexilant at home  . Sulfonamide Derivatives   . Vesicare [Solifenacin]   . Zithromax [Azithromycin]     Objective: Physical Exam  General: Well developed, nourished, no acute distress, awake, alert and oriented x 3 with walker and wheelchair  Vascular: Dorsalis pedis artery 0/4 bilateral, Posterior tibial artery 0/4 bilateral, skin temperature warm to cool proximal to distal bilateral lower  extremities, severe varicosities, hyperpigmented changes bilateral, trophic skin changes, no pedal hair present bilateral.  Neurological: Gross sensation present via light touch bilateral.   Dermatological: Skin is cool, dry, and supple bilateral, Nails 1-10 are tender, long, thick, and discolored with mild subungal debris, +corn to right 3rd toe and sub met 1 bilateral,  no webspace macerations present bilateral, no open lesions present bilateral, no callus/corns/hyperkeratotic tissue present bilateral. No signs of infection bilateral.  Musculoskeletal:  Asymptomatic hammertoe and pes planus boney deformities noted bilateral. Muscular strength within normal limits without painon range of motion. No pain with calf compression bilateral.  Assessment and Plan:  Problem List Items Addressed This Visit    None    Visit Diagnoses    Pain due to onychomycosis of toenails of both feet    -  Primary   Callus       PVD (peripheral vascular disease) ([place])       Current use of long term anticoagulation         -Examined patient.  -Discussed treatment options for painful mycotic nails. -Mechanically debrided and reduced mycotic nails with sterile nail nipper and dremel nail file without incident. -Mechanically debrided callus x 3 using sterile chisel blade without incident -Recommend elevation to assist with edema control -Patient to return in 3 months for follow up evaluation or sooner if symptoms worsen.  Landis Martins, DPM

## 2020-01-08 ENCOUNTER — Encounter: Payer: Self-pay | Admitting: Sports Medicine

## 2020-01-08 ENCOUNTER — Ambulatory Visit (INDEPENDENT_AMBULATORY_CARE_PROVIDER_SITE_OTHER): Payer: Medicare (Managed Care) | Admitting: Sports Medicine

## 2020-01-08 ENCOUNTER — Other Ambulatory Visit: Payer: Self-pay

## 2020-01-08 VITALS — Temp 97.2°F

## 2020-01-08 DIAGNOSIS — B351 Tinea unguium: Secondary | ICD-10-CM | POA: Diagnosis not present

## 2020-01-08 DIAGNOSIS — M79675 Pain in left toe(s): Secondary | ICD-10-CM

## 2020-01-08 DIAGNOSIS — I739 Peripheral vascular disease, unspecified: Secondary | ICD-10-CM | POA: Diagnosis not present

## 2020-01-08 DIAGNOSIS — L84 Corns and callosities: Secondary | ICD-10-CM

## 2020-01-08 DIAGNOSIS — Z7901 Long term (current) use of anticoagulants: Secondary | ICD-10-CM

## 2020-01-08 DIAGNOSIS — M79674 Pain in right toe(s): Secondary | ICD-10-CM

## 2020-01-08 DIAGNOSIS — T148XXA Other injury of unspecified body region, initial encounter: Secondary | ICD-10-CM

## 2020-01-08 NOTE — Patient Instructions (Signed)

## 2020-01-09 ENCOUNTER — Encounter: Payer: Self-pay | Admitting: Sports Medicine

## 2020-01-09 NOTE — Progress Notes (Signed)
Subjective: Stacey Davenport is a 79 y.o. female patient seen today in office with complaint of mildly painful thickened and elongated toenails; unable to trim. Patient still on coumadin like before and reports that she was putting a bandaid on bottom of left foot and when she pulled it the skin came off with it. Patient has no other pedal complaints at this time.     Patient Active Problem List   Diagnosis Date Noted  . Irregular heart beat 08/15/2017  . Primary open angle glaucoma of both eyes, mild stage 04/11/2017  . High risk medication use 09/03/2013  . Multiple drug allergies 09/03/2013  . 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 Hypothyroidism 04/23/2013  . Hyperlipidemia 12/06/2012  . CVA History 12/06/2012  . Atrial fibrillation ([place]) 09/30/2011  . Hypertension 08/20/2009  . Glaucoma 04/17/2009  . ASTHMA 04/17/2009  . ESOPHAGEAL STRICTURE 04/17/2009  . GERD 04/17/2009  . Rheumatoid arthritis(714.0) 04/17/2009    Current Outpatient Medications on File Prior to Visit  Medication Sig Dispense Refill  . amiodarone (PACERONE) 200 MG tablet Take 1 tablet (200 mg total) by mouth daily. 30 tablet 11  . brimonidine (ALPHAGAN) 0.2 % ophthalmic solution USE 1 DROP IN EACH EYE 3 TIMES DAILY 10 mL 0  . calcium carbonate (TUMS - DOSED IN MG ELEMENTAL CALCIUM) 500 MG chewable tablet Chew 1 tablet by mouth as needed.     . cetirizine (ZYRTEC) 10 MG tablet Take 10 mg by mouth daily.    . cholecalciferol (VITAMIN D) 1000 UNITS tablet Take 1,000 Units by mouth daily.     . ciclesonide (ALVESCO) 160 MCG/ACT inhaler Inhale 1 puff into the lungs 2 (two) times daily.    Marland Kitchen diltiazem (TIAZAC) 180 MG 24 hr capsule Take 1 capsule (180 mg total) by mouth 2 (two) times daily. Take one tablet by mouth in the evening 60 capsule 11  . fluticasone (FLONASE) 50 MCG/ACT nasal spray Place into both nostrils daily.    Marland Kitchen latanoprost (XALATAN) 0.005 % ophthalmic solution PLACE 1 DROP IN EACH EYE AT BEDTIME  2.5 mL 0  . levalbuterol (XOPENEX) 0.63 MG/3ML nebulizer solution Take 0.63 mg by nebulization every 8 (eight) hours as needed for wheezing or shortness of breath.    . levothyroxine (SYNTHROID, LEVOTHROID) 75 MCG tablet Take 75 mcg by mouth daily before breakfast. Take once daily    . LORazepam (ATIVAN) 0.5 MG tablet TAKE 1 TO 2 TABLETS EVERY DAY 60 tablet 1  . losartan (COZAAR) 25 MG tablet Take 25 mg by mouth daily. Take 1/2 tablet by mouth daily    . montelukast (SINGULAIR) 10 MG tablet Take 1 tablet by mouth every evening.    . predniSONE (DELTASONE) 5 MG tablet Take 5 mg by mouth. Take 1 1/2 tablet by mouth daily    . sertraline (ZOLOFT) 25 MG tablet Take 25 mg by mouth daily.    Penne Lash HFA 45 MCG/ACT inhaler USE 2 PUFFS TWICE DAILY 15 g 2  . warfarin (COUMADIN) 4 MG tablet Take 2 mg by mouth as directed. Take 2 mg Sunday and Thursday. Take 1.5  mg monday, Tuesday, Wednesday, Friday and Saturday    . [DISCONTINUED] potassium chloride (K-DUR,KLOR-CON) 10 MEQ tablet Take 1 tablet (10 mEq total) by mouth daily. 30 tablet 11   No current facility-administered medications on file prior to visit.    Allergies  Allergen Reactions  . Sulfamethoxazole Nausea And Vomiting    Bladder infection   . Aciphex [Rabeprazole Sodium]   .  Amoxicillin   . Biaxin [Clarithromycin] Hives and Itching  . Cephalexin   . Esomeprazole Magnesium     Takes Dexilant at home  . Hydrocodone   . Hydrocodone-Acetaminophen   . Ketek [Telithromycin]   . Latex   . Levaquin [Levofloxacin In D5w]   . Lipitor [Atorvastatin]   . Macrobid [Nitrofurantoin Macrocrystal]   . Moxifloxacin   . Nexium [Esomeprazole Magnesium]   . Rabeprazole Sodium     Takes Dexilant at home  . Sulfonamide Derivatives   . Vesicare [Solifenacin]   . Zithromax [Azithromycin]     Objective: Physical Exam  General: Well developed, nourished, no acute distress, awake, alert and oriented x 3 with walker and wheelchair  Vascular:  Dorsalis pedis artery 0/4 bilateral, Posterior tibial artery 0/4 bilateral, skin temperature warm to cool proximal to distal bilateral lower extremities, severe varicosities, hyperpigmented changes bilateral, trophic skin changes, no pedal hair present bilateral.  Neurological: Gross sensation present via light touch bilateral.   Dermatological: Skin is cool, dry, and supple bilateral, Nails 1-10 are tender, long, thick, and discolored with mild subungal debris, +corn to right 3rd toe and sub met 1 bilateral,  no webspace macerations present bilateral, superfical abrasion to plantar left foot. No signs of infection bilateral.  Musculoskeletal:  Asymptomatic hammertoe and pes planus boney deformities noted bilateral. Muscular strength within normal limits without painon range of motion. No pain with calf compression bilateral.  Assessment and Plan:  Problem List Items Addressed This Visit    None    Visit Diagnoses    Pain due to onychomycosis of toenails of both feet    -  Primary   Callus       PVD (peripheral vascular disease) ([place])       Current use of long term anticoagulation       Abrasion         -Examined patient.  -Discussed treatment options for painful mycotic nails. -Mechanically debrided and reduced mycotic nails with sterile nail nipper and dremel nail file without incident. -Mechanically debrided callus x 3 using sterile chisel blade without incident at no additional charge -Advised patient to refrain from using bandaids and treated abrasion with lumcain and applied padding to shoes -Gave shoe list for supportive shoe options -Recommend elevation to assist with edema control -Patient to return in 3 months for follow up evaluation or sooner if symptoms worsen.  Landis Martins, DPM

## 2020-04-10 ENCOUNTER — Other Ambulatory Visit: Payer: Self-pay

## 2020-04-10 ENCOUNTER — Encounter: Payer: Self-pay | Admitting: Sports Medicine

## 2020-04-10 ENCOUNTER — Ambulatory Visit (INDEPENDENT_AMBULATORY_CARE_PROVIDER_SITE_OTHER): Payer: Medicare (Managed Care) | Admitting: Sports Medicine

## 2020-04-10 DIAGNOSIS — M79675 Pain in left toe(s): Secondary | ICD-10-CM | POA: Diagnosis not present

## 2020-04-10 DIAGNOSIS — B351 Tinea unguium: Secondary | ICD-10-CM

## 2020-04-10 DIAGNOSIS — I739 Peripheral vascular disease, unspecified: Secondary | ICD-10-CM | POA: Diagnosis not present

## 2020-04-10 DIAGNOSIS — L84 Corns and callosities: Secondary | ICD-10-CM

## 2020-04-10 DIAGNOSIS — Z7901 Long term (current) use of anticoagulants: Secondary | ICD-10-CM

## 2020-04-10 DIAGNOSIS — M79674 Pain in right toe(s): Secondary | ICD-10-CM | POA: Diagnosis not present

## 2020-04-10 NOTE — Progress Notes (Signed)
Subjective: Stacey Davenport is a 79 y.o. female patient seen today in office with complaint of mildly painful thickened callus/corn to toes and elongated toenails; unable to trim. Patient still on coumadin like before with no changes but does note today she had a little bit of sensitivity to her toes.  Patient has no other pedal complaints at this time.     Patient Active Problem List   Diagnosis Date Noted  . Irregular heart beat 08/15/2017  . Primary open angle glaucoma of both eyes, mild stage 04/11/2017  . High risk medication use 09/03/2013  . Multiple drug allergies 09/03/2013  . 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 Hypothyroidism 04/23/2013  . Hyperlipidemia 12/06/2012  . CVA History 12/06/2012  . Atrial fibrillation ([place]) 09/30/2011  . Hypertension 08/20/2009  . Glaucoma 04/17/2009  . ASTHMA 04/17/2009  . ESOPHAGEAL STRICTURE 04/17/2009  . GERD 04/17/2009  . Rheumatoid arthritis(714.0) 04/17/2009    Current Outpatient Medications on File Prior to Visit  Medication Sig Dispense Refill  . amiodarone (PACERONE) 200 MG tablet Take 1 tablet (200 mg total) by mouth daily. 30 tablet 11  . brimonidine (ALPHAGAN) 0.2 % ophthalmic solution USE 1 DROP IN EACH EYE 3 TIMES DAILY 10 mL 0  . calcium carbonate (TUMS - DOSED IN MG ELEMENTAL CALCIUM) 500 MG chewable tablet Chew 1 tablet by mouth as needed.     . cetirizine (ZYRTEC) 10 MG tablet Take 10 mg by mouth daily.    . cholecalciferol (VITAMIN D) 1000 UNITS tablet Take 1,000 Units by mouth daily.     . ciclesonide (ALVESCO) 160 MCG/ACT inhaler Inhale 1 puff into the lungs 2 (two) times daily.    Marland Kitchen diltiazem (TIAZAC) 180 MG 24 hr capsule Take 1 capsule (180 mg total) by mouth 2 (two) times daily. Take one tablet by mouth in the evening 60 capsule 11  . fluticasone (FLONASE) 50 MCG/ACT nasal spray Place into both nostrils daily.    Marland Kitchen latanoprost (XALATAN) 0.005 % ophthalmic solution PLACE 1 DROP IN EACH EYE AT BEDTIME 2.5 mL 0   . levalbuterol (XOPENEX) 0.63 MG/3ML nebulizer solution Take 0.63 mg by nebulization every 8 (eight) hours as needed for wheezing or shortness of breath.    . levothyroxine (SYNTHROID, LEVOTHROID) 75 MCG tablet Take 75 mcg by mouth daily before breakfast. Take once daily    . LORazepam (ATIVAN) 0.5 MG tablet TAKE 1 TO 2 TABLETS EVERY DAY 60 tablet 1  . losartan (COZAAR) 25 MG tablet Take 25 mg by mouth daily. Take 1/2 tablet by mouth daily    . montelukast (SINGULAIR) 10 MG tablet Take 1 tablet by mouth every evening.    . predniSONE (DELTASONE) 5 MG tablet Take 5 mg by mouth. Take 1 1/2 tablet by mouth daily    . sertraline (ZOLOFT) 25 MG tablet Take 25 mg by mouth daily.    Penne Lash HFA 45 MCG/ACT inhaler USE 2 PUFFS TWICE DAILY 15 g 2  . warfarin (COUMADIN) 4 MG tablet Take 2 mg by mouth as directed. Take 2 mg Sunday and Thursday. Take 1.5  mg monday, Tuesday, Wednesday, Friday and Saturday    . [DISCONTINUED] potassium chloride (K-DUR,KLOR-CON) 10 MEQ tablet Take 1 tablet (10 mEq total) by mouth daily. 30 tablet 11   No current facility-administered medications on file prior to visit.    Allergies  Allergen Reactions  . Sulfamethoxazole Nausea And Vomiting    Bladder infection   . Aciphex [Rabeprazole Sodium]   . Amoxicillin   .  Biaxin [Clarithromycin] Hives and Itching  . Cephalexin   . Esomeprazole Magnesium     Takes Dexilant at home  . Hydrocodone   . Hydrocodone-Acetaminophen   . Ketek [Telithromycin]   . Latex   . Levaquin [Levofloxacin In D5w]   . Lipitor [Atorvastatin]   . Macrobid [Nitrofurantoin Macrocrystal]   . Moxifloxacin   . Nexium [Esomeprazole Magnesium]   . Rabeprazole Sodium     Takes Dexilant at home  . Sulfonamide Derivatives   . Vesicare [Solifenacin]   . Zithromax [Azithromycin]     Objective: Physical Exam  General: Well developed, nourished, no acute distress, awake, alert and oriented x 3 with wheelchair.  Vascular: Dorsalis pedis artery  0/4 bilateral, Posterior tibial artery 0/4 bilateral, skin temperature warm to cool proximal to distal bilateral lower extremities, severe varicosities, hyperpigmented changes bilateral, trophic skin changes, no pedal hair present bilateral.  Neurological: Gross sensation present via light touch bilateral.   Dermatological: Skin is cool, dry, and supple bilateral, Nails 1-10 are tender, long, thick, and discolored with mild subungal debris, +corn to right 3rd toe and sub met 1 bilateral,  no webspace macerations present bilateral, superfical abrasion to plantar left foot. No signs of infection bilateral.  Musculoskeletal:  Asymptomatic hammertoe and pes planus boney deformities noted bilateral. Muscular strength within normal limits without painon range of motion. No pain with calf compression bilateral.  Assessment and Plan:  Problem List Items Addressed This Visit    None    Visit Diagnoses    Pain due to onychomycosis of toenails of both feet    -  Primary   Callus       PVD (peripheral vascular disease) ([place])       Current use of long term anticoagulation         -Examined patient.  -Discussed treatment options for painful mycotic nails. -Mechanically debrided and reduced mycotic nails with sterile nail nipper and dremel nail file without incident. -Mechanically debrided callus x 3 using sterile chisel blade without incident at no additional charge -Advised patient to continue with good supportive shoes and for foot type -Continue with elevation to assist with edema control like before -Patient to return in 3 months for follow up evaluation or sooner if symptoms worsen.  Landis Martins, DPM

## 2020-05-09 ENCOUNTER — Ambulatory Visit
Admission: RE | Admit: 2020-05-09 | Discharge: 2020-05-09 | Disposition: A | Discharge status: Home / Self Care | Payer: Medicare (Managed Care) | Source: Ambulatory Visit | Attending: Family Medicine | Admitting: Family Medicine

## 2020-05-09 ENCOUNTER — Other Ambulatory Visit: Payer: Self-pay | Admitting: Family Medicine

## 2020-05-09 DIAGNOSIS — M25552 Pain in left hip: Secondary | ICD-10-CM

## 2020-05-09 DIAGNOSIS — W19XXXA Unspecified fall, initial encounter: Secondary | ICD-10-CM

## 2020-05-09 DIAGNOSIS — T462X4S Poisoning by other antidysrhythmic drugs, undetermined, sequela: Secondary | ICD-10-CM

## 2020-05-26 ENCOUNTER — Ambulatory Visit: Payer: Medicare (Managed Care) | Admitting: Cardiology

## 2020-05-27 NOTE — Progress Notes (Signed)
Cardiology Office Note   Date:  53/12/4678   ID:  Sanyiah, Kanzler [birth year], MRN [id number]  PCP:  Janifer Adie, MD  Cardiologist:   Minus Breeding, MD   Chief Complaint  Patient presents with  . Shortness of Breath      History of Present Illness: Stacey Davenport is a 79 y.o. female who presents for followup of atrial fib.  This has been paroxysmal.  She has had no symptomatic paroxysms since I last saw her.  She lives at [employer] in [place].  She gets around with a walker.  She has help twice a week.  However, she does her own meals.  The patient denies any new symptoms such as chest discomfort, neck or arm discomfort. There has been no new shortness of breath, PND or orthopnea. There have been no reported palpitations, presyncope or syncope.  She does have chronic shortness of breath after she eats a heavy meal but otherwise is describing no change in symptoms.  Today she comes with a question by her primary providers as to whether she should continue amiodarone.   Past Medical History:  Diagnosis Date  . Anxiety   . Asthma   . Atrial fibrillation ([place])   . CVA (cerebral infarction)    a. 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 Esophageal stricture   . Gastric polyp    Hyperplastic  . Gastritis   . GERD (gastroesophageal reflux disease)   . Glaucoma   . Hiatal hernia   . Hyperlipidemia   . Hypertension   . Hypothyroidism   . Osteoarthritis   . Pneumonia   . Rheumatoid arthritis(714.0)   . Thyroid disease     Past Surgical History:  Procedure Laterality Date  . APPENDECTOMY    . BLEPHAROPLASTY Right   . CATARACT EXTRACTION Bilateral   . CHOLECYSTECTOMY    . TUBAL LIGATION       Current Outpatient Medications  Medication Sig Dispense Refill  . amiodarone (PACERONE) 200 MG tablet Take 1 tablet (200 mg total) by mouth daily. 30 tablet 11  . brimonidine (ALPHAGAN) 0.2 % ophthalmic solution USE 1 DROP  IN EACH EYE 3 TIMES DAILY 10 mL 0  . calcium carbonate (TUMS - DOSED IN MG ELEMENTAL CALCIUM) 500 MG chewable tablet Chew 1 tablet by mouth as needed.     . cholecalciferol (VITAMIN D) 1000 UNITS tablet Take 1,000 Units by mouth daily.     . ciclesonide (ALVESCO) 160 MCG/ACT inhaler Inhale 1 puff into the lungs 2 (two) times daily.    Marland Kitchen diltiazem (TIAZAC) 180 MG 24 hr capsule Take 1 capsule (180 mg total) by mouth 2 (two) times daily. Take one tablet by mouth in the evening 60 capsule 11  . latanoprost (XALATAN) 0.005 % ophthalmic solution PLACE 1 DROP IN EACH EYE AT BEDTIME 2.5 mL 0  . levalbuterol (XOPENEX) 0.63 MG/3ML nebulizer solution Take 0.63 mg by nebulization every 8 (eight) hours as needed for wheezing or shortness of breath.    . levothyroxine (SYNTHROID, LEVOTHROID) 75 MCG tablet Take 75 mcg by mouth daily before breakfast. Take once daily    . loratadine (CLARITIN) 10 MG tablet Take 10 mg by mouth daily.    Marland Kitchen LORazepam (ATIVAN) 0.5 MG tablet TAKE 1 TO 2 TABLETS EVERY DAY 60 tablet 1  . losartan (COZAAR) 25 MG tablet Take 25 mg by mouth daily. Take 1/2 tablet by mouth daily    .  montelukast (SINGULAIR) 10 MG tablet Take 1 tablet by mouth every evening.    . predniSONE (DELTASONE) 5 MG tablet Take 5 mg by mouth. Take 1 1/2 tablet by mouth daily    . sertraline (ZOLOFT) 25 MG tablet Take 25 mg by mouth daily.    . cetirizine (ZYRTEC) 10 MG tablet Take 10 mg by mouth daily. (Patient not taking: Reported on 05/28/2020)    . fluticasone (FLONASE) 50 MCG/ACT nasal spray Place into both nostrils daily. (Patient not taking: Reported on 05/28/2020)    . warfarin (COUMADIN) 4 MG tablet Take 2 mg by mouth as directed. Take 2 mg Sunday and Thursday. Take 1.5  mg monday, Tuesday, Wednesday, Friday and Saturday    . XOPENEX HFA 45 MCG/ACT inhaler USE 2 PUFFS TWICE DAILY (Patient not taking: Reported on 05/28/2020) 15 g 2   No current facility-administered medications for this visit.    Allergies:    Sulfamethoxazole, Aciphex [rabeprazole sodium], Amoxicillin, Biaxin [clarithromycin], Cephalexin, Esomeprazole magnesium, Hydrocodone, Hydrocodone-acetaminophen, Ketek [telithromycin], Latex, Levaquin [levofloxacin in d5w], Lipitor [atorvastatin], Macrobid [nitrofurantoin macrocrystal], Moxifloxacin, Nexium [esomeprazole magnesium], Rabeprazole sodium, Sulfonamide derivatives, Vesicare [solifenacin], and Zithromax [azithromycin]   ROS:  Please see the history of present illness.   Otherwise, review of systems are positive for none.   All other systems are reviewed and negative.    PHYSICAL EXAM: VS:  BP (!) 182/66 (BP Location: Left Arm, Patient Position: Sitting)   Ht 5\' 3"  (1.6 m)   Wt 166 lb 9.6 oz (75.6 kg)   SpO2 98%   BMI 29.51 kg/m  , BMI Body mass index is 29.51 kg/m. GEN:  No distress NECK:  No jugular venous distention at 90 degrees, waveform within normal limits, carotid upstroke brisk and symmetric, no bruits, no thyromegaly LYMPHATICS:  No cervical adenopathy LUNGS:  Clear to auscultation bilaterally BACK:  No CVA tenderness CHEST:  Unremarkable HEART:  S1 and S2 within normal limits, no S3, no S4, no clicks, no rubs, 3 out of 6 apical systolic murmur radiating slightly at the aortic outflow tract, no diastolic murmurs ABD:  Positive bowel sounds normal in frequency in pitch, no bruits, no rebound, no guarding, unable to assess midline mass or bruit with the patient seated. EXT:  2 plus pulses throughout, moderate edema, no cyanosis no clubbing SKIN:  No rashes no nodules NEURO:  Cranial nerves II through XII grossly intact, motor grossly intact throughout PSYCH:  Cognitively intact, oriented to person place and time   EKG:  EKG is ordered today. The ekg ordered today demonstrates sinus rhythm, rate 61, axis within normal limits, intervals within normal limits, poor anterior R wave progression.   Recent Labs: No results found for requested labs within last 8760 hours.     Lipid Panel    Component Value Date/Time   CHOL 175 10/16/2013 1142   CHOL 198 12/06/2012 1040   TRIG 81 10/16/2013 1142   TRIG 94 12/06/2012 1040   HDL 69 10/16/2013 1142   HDL 88 12/06/2012 1040   CHOLHDL 1.8 09/30/2011 1610   VLDL 16 09/30/2011 1610   LDLCALC 90 10/16/2013 1142   LDLCALC 91 12/06/2012 1040      Wt Readings from Last 3 Encounters:  05/28/20 166 lb 9.6 oz (75.6 kg)  05/24/18 166 lb (75.3 kg)  01/24/18 167 lb 9.6 oz (76 kg)      Other studies Reviewed: Additional studies/ records that were reviewed today include: Labs. Review of the above records demonstrates:  Please see  elsewhere in the note.     ASSESSMENT AND PLAN:  ATRIAL FIB:     The question has been raised as to whether the patient should continue amiodarone.  She was quite symptomatic with atrial fibrillation in 2013 and has maintained sinus rhythm on low-dose amiodarone.  She had no complications as far as thyroid or liver or other known complications so I think it is most prudent to continue this.  I will defer to her primary providers to check TSH and liver profile every 6 months or so.  The patient tolerates anticoagulation and I would continue this as well.  HTN:    Her blood pressure is elevated.  However, this is not usual.  She reports some whitecoat hypertension.  I have sent a written note to her primary providers to follow-up with blood pressures at [facility].  She might need med adjustment based on more data.    Current medicines are reviewed at length with the patient today.  The patient does not have concerns regarding medicines.  The following changes have been made:  no change  Labs/ tests ordered today include: None  Orders Placed This Encounter  Procedures  . EKG 12-Lead     Disposition:   FU with me in one year.      Signed, Minus Breeding, MD  05/28/2020 2:38 PM    [facility]

## 2020-05-28 ENCOUNTER — Other Ambulatory Visit: Payer: Self-pay

## 2020-05-28 ENCOUNTER — Encounter: Payer: Self-pay | Admitting: Cardiology

## 2020-05-28 ENCOUNTER — Ambulatory Visit (INDEPENDENT_AMBULATORY_CARE_PROVIDER_SITE_OTHER): Payer: Medicare (Managed Care) | Admitting: Cardiology

## 2020-05-28 VITALS — BP 182/66 | Ht 63.0 in | Wt 166.6 lb

## 2020-05-28 DIAGNOSIS — I480 Paroxysmal atrial fibrillation: Secondary | ICD-10-CM | POA: Diagnosis not present

## 2020-05-28 DIAGNOSIS — I10 Essential (primary) hypertension: Secondary | ICD-10-CM | POA: Diagnosis not present

## 2020-05-28 NOTE — Patient Instructions (Signed)
Medication Instructions:  NO CHANGES  *If you need a refill on your cardiac medications before your next appointment, please call your pharmacy*   Lab Work: NOT NEEDED    Testing/Procedures: NOT NEEDED   Follow-Up: At [facility], you and your health needs are our priority.  As part of our continuing mission to provide you with exceptional heart care, we have created designated Provider Care Teams.  These Care Teams include your primary Cardiologist (physician) and Advanced Practice Providers (APPs -  Physician Assistants and Nurse Practitioners) who all work together to provide you with the care you need, when you need it.  We recommend signing up for the patient portal called "MyChart".  Sign up information is provided on this After Visit Summary.  MyChart is used to connect with patients for Virtual Visits (Telemedicine).  Patients are able to view lab/test results, encounter notes, upcoming appointments, etc.  Non-urgent messages can be sent to your provider as well.   To learn more about what you can do with MyChart, go to [email or web address].    Your next appointment:   12 month(s)  The format for your next appointment:   In Person  Provider:   Minus Breeding, MD

## 2020-07-10 ENCOUNTER — Ambulatory Visit: Payer: Medicare (Managed Care) | Admitting: Sports Medicine

## 2020-10-29 ENCOUNTER — Other Ambulatory Visit (HOSPITAL_COMMUNITY): Payer: Self-pay | Admitting: *Deleted

## 2020-10-29 NOTE — Discharge Instructions (Signed)
Ferumoxytol injection What is this medicine? FERUMOXYTOL is an iron complex. Iron is used to make healthy red blood cells, which carry oxygen and nutrients throughout the body. This medicine is used to treat iron deficiency anemia. This medicine may be used for other purposes; ask your health care provider or pharmacist if you have questions. COMMON BRAND NAME(S): Feraheme What should I tell my health care provider before I take this medicine? They need to know if you have any of these conditions:  anemia not caused by low iron levels  high levels of iron in the blood  magnetic resonance imaging (MRI) test scheduled  an unusual or allergic reaction to iron, other medicines, foods, dyes, or preservatives  pregnant or trying to get pregnant  breast-feeding How should I use this medicine? This medicine is for injection into a vein. It is given by a health care professional in a hospital or clinic setting. Talk to your pediatrician regarding the use of this medicine in children. Special care may be needed. Overdosage: If you think you have taken too much of this medicine contact a poison control center or emergency room at once. NOTE: This medicine is only for you. Do not share this medicine with others. What if I miss a dose? It is important not to miss your dose. Call your doctor or health care professional if you are unable to keep an appointment. What may interact with this medicine? This medicine may interact with the following medications:  other iron products This list may not describe all possible interactions. Give your health care provider a list of all the medicines, herbs, non-prescription drugs, or dietary supplements you use. Also tell them if you smoke, drink alcohol, or use illegal drugs. Some items may interact with your medicine. What should I watch for while using this medicine? Visit your doctor or healthcare professional regularly. Tell your doctor or healthcare  professional if your symptoms do not start to get better or if they get worse. You may need blood work done while you are taking this medicine. You may need to follow a special diet. Talk to your doctor. Foods that contain iron include: whole grains/cereals, dried fruits, beans, or peas, leafy green vegetables, and organ meats (liver, kidney). What side effects may I notice from receiving this medicine? Side effects that you should report to your doctor or health care professional as soon as possible:  allergic reactions like skin rash, itching or hives, swelling of the face, lips, or tongue  breathing problems  changes in blood pressure  feeling faint or lightheaded, falls  fever or chills  flushing, sweating, or hot feelings  swelling of the ankles or feet Side effects that usually do not require medical attention (report to your doctor or health care professional if they continue or are bothersome):  diarrhea  headache  nausea, vomiting  stomach pain This list may not describe all possible side effects. Call your doctor for medical advice about side effects. You may report side effects to FDA at 1-800-FDA-1088. Where should I keep my medicine? This drug is given in a hospital or clinic and will not be stored at home. NOTE: This sheet is a summary. It may not cover all possible information. If you have questions about this medicine, talk to your doctor, pharmacist, or health care provider.  2021 Elsevier/Gold Standard (2016-08-30 20:21:10)  

## 2020-10-30 ENCOUNTER — Encounter (HOSPITAL_COMMUNITY)
Admission: RE | Admit: 2020-10-30 | Discharge: 2020-10-30 | Disposition: A | Discharge status: Home / Self Care | Payer: Medicare (Managed Care) | Source: Ambulatory Visit | Attending: Family Medicine | Admitting: Family Medicine

## 2020-10-30 ENCOUNTER — Other Ambulatory Visit: Payer: Self-pay

## 2020-10-30 DIAGNOSIS — D500 Iron deficiency anemia secondary to blood loss (chronic): Secondary | ICD-10-CM | POA: Insufficient documentation

## 2020-10-30 MED ORDER — SODIUM CHLORIDE 0.9 % IV SOLN
510.0000 mg | Freq: Once | INTRAVENOUS | Status: DC
  Administered 2020-10-30: 510 mg via INTRAVENOUS
  Filled 2020-10-30: qty 510

## 2020-11-13 ENCOUNTER — Encounter (HOSPITAL_COMMUNITY)
Admission: RE | Admit: 2020-11-13 | Discharge: 2020-11-13 | Disposition: A | Discharge status: Home / Self Care | Payer: Medicare (Managed Care) | Source: Ambulatory Visit | Attending: Family Medicine | Admitting: Family Medicine

## 2020-11-13 ENCOUNTER — Other Ambulatory Visit: Payer: Self-pay

## 2020-11-13 DIAGNOSIS — D500 Iron deficiency anemia secondary to blood loss (chronic): Secondary | ICD-10-CM | POA: Diagnosis not present

## 2020-11-13 MED ORDER — SODIUM CHLORIDE 0.9 % IV SOLN
510.0000 mg | Freq: Once | INTRAVENOUS | Status: AC
  Administered 2020-11-13: 510 mg via INTRAVENOUS
  Filled 2020-11-13: qty 510

## 2020-12-16 ENCOUNTER — Ambulatory Visit: Payer: Medicare (Managed Care) | Admitting: Orthopaedic Surgery

## 2020-12-30 ENCOUNTER — Other Ambulatory Visit: Payer: Self-pay

## 2020-12-30 ENCOUNTER — Encounter: Payer: Self-pay | Admitting: Orthopaedic Surgery

## 2020-12-30 ENCOUNTER — Ambulatory Visit (INDEPENDENT_AMBULATORY_CARE_PROVIDER_SITE_OTHER): Payer: Medicare (Managed Care) | Admitting: Orthopaedic Surgery

## 2020-12-30 ENCOUNTER — Ambulatory Visit (INDEPENDENT_AMBULATORY_CARE_PROVIDER_SITE_OTHER): Payer: Medicare (Managed Care)

## 2020-12-30 DIAGNOSIS — M66242 Spontaneous rupture of extensor tendons, left hand: Secondary | ICD-10-CM | POA: Diagnosis not present

## 2020-12-30 NOTE — Progress Notes (Signed)
Office Visit Note   Patient: Stacey Davenport           Date of Birth: 11-12-1940           MRN: [id number] Visit Date: 12/30/2020              Requested by: Janifer Adie, MD [address] [place],  [place] [address] PCP: Janifer Adie, MD   Assessment & Plan: Visit Diagnoses:  1. 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  Patient does have rheumatoid arthritis.  I recommended custom splinting but patient refused.  She states that she will just live with this deformity as it does not cause her any pain.  We will see her back as needed.  Follow-Up Instructions: Return in about 8 weeks (around 02/24/2021).   Orders:  Orders Placed This Encounter  Procedures  . XR Hand Complete Left   No orders of the defined types were placed in this encounter.     Procedures: No procedures performed   Clinical Data: No additional findings.   Subjective: Chief Complaint  Patient presents with  . Left Middle Finger - Routine Post Op, Pain    Stacey Davenport is a 80 year old female resident at [facility] who comes in for evaluation of left long finger deformity for about a month.  She was drying her hair with a towel when she felt that she twisted her long finger.  Since then she has had an extensor lag.  She is able to make a full fist.  Denies any tenderness.  No numbness or tingling.   Review of Systems  Constitutional: Negative.   HENT: Negative.   Eyes: Negative.   Respiratory: Negative.   Cardiovascular: Negative.   Endocrine: Negative.   Musculoskeletal: Negative.   Neurological: Negative.   Hematological: Negative.   Psychiatric/Behavioral: Negative.   All other systems reviewed and are negative.    Objective: Vital Signs: There were no vitals taken for this visit.  Physical Exam Vitals and nursing note reviewed.  Constitutional:      Appearance: She is  well-developed.  HENT:     Head: Normocephalic and atraumatic.  Pulmonary:     Effort: Pulmonary effort is normal.  Abdominal:     Palpations: Abdomen is soft.  Musculoskeletal:     Cervical back: Neck supple.  Skin:    General: Skin is warm.     Capillary Refill: Capillary refill takes less than 2 seconds.  Neurological:     Mental Status: She is alert and oriented to person, place, and time.  Psychiatric:        Behavior: Behavior normal.        Thought Content: Thought content normal.        Judgment: Judgment normal.     Ortho Exam Left hand shows supple flexion of the fingers she is able to make a full composite fist.  She has active extensor lag.  The extensor tendon of the long finger appears to be positioned slightly radially.  There is no obvious subluxation or dislocation. Specialty Comments:  No specialty comments available.  Imaging: XR Hand Complete Left  Result Date: 12/30/2020 Diffuse osteopenia.  Advanced thumb basal joint arthritis    PMFS History: Patient Active Problem List   Diagnosis Date Noted  . Sagittal band rupture, extensor tendon, nontraumatic, left 12/30/2020  . Irregular heart beat 08/15/2017  .  Primary open angle glaucoma of both eyes, mild stage 04/11/2017  . High risk medication use 09/03/2013  . Multiple drug allergies 09/03/2013  . 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 Hypothyroidism 04/23/2013  . Hyperlipidemia 12/06/2012  . CVA History 12/06/2012  . Atrial fibrillation ([place]) 09/30/2011  . Hypertension 08/20/2009  . Glaucoma 04/17/2009  . ASTHMA 04/17/2009  . ESOPHAGEAL STRICTURE 04/17/2009  . GERD 04/17/2009  . Rheumatoid arthritis(714.0) 04/17/2009   Past Medical History:  Diagnosis Date  . Anxiety   . Asthma   . Atrial fibrillation ([place])   . CVA (cerebral infarction)    a. 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 Esophageal stricture   . Gastric polyp    Hyperplastic  . Gastritis    . GERD (gastroesophageal reflux disease)   . Glaucoma   . Hiatal hernia   . Hyperlipidemia   . Hypertension   . Hypothyroidism   . Osteoarthritis   . Pneumonia   . Rheumatoid arthritis(714.0)   . Thyroid disease     Family History  Problem Relation Age of Onset  . Diabetes Mother   . Cancer Father        lymph nodes???  . Diabetes Sister   . Heart disease Sister   . Colon cancer Neg Hx   . Esophageal cancer Neg Hx     Past Surgical History:  Procedure Laterality Date  . APPENDECTOMY    . BLEPHAROPLASTY Right   . CATARACT EXTRACTION Bilateral   . CHOLECYSTECTOMY    . TUBAL LIGATION     Social History   Occupational History  . Not on file  Tobacco Use  . Smoking status: Never Smoker  . Smokeless tobacco: Never Used  Vaping Use  . Vaping Use: Never used  Substance and Sexual Activity  . Alcohol use: No    Alcohol/week: 0.0 standard drinks  . Drug use: No  . Sexual activity: Never    Birth control/protection: Post-menopausal

## 2021-04-22 ENCOUNTER — Telehealth: Payer: Self-pay

## 2021-04-22 ENCOUNTER — Telehealth: Payer: Self-pay | Admitting: Cardiology

## 2021-04-22 NOTE — Telephone Encounter (Signed)
Patient states she went to her PCP yesterday and she lost a significant amount of weight. She states she weighed 135 lbs. At her appointment prior to that she states she was at about 149 lbs and before that she was around 160 lbs for a while. She would like to know if Dr. Percival Spanish has any recommendations.

## 2021-04-22 NOTE — Telephone Encounter (Signed)
Patient requested to call [facility] and let them know about 10/20 appointment with Dr.Hochrein.Spoke to [facility] they cannot bring pt to appointment 10/20.Appointment rescheduled with Dr.Hochrein to 11/17 at 11:20 am.[facility] will call back with authorization #. Called patient left message on personal voice mail [facility] unable to bring on 10/20.Appointment rescheduled to 11/17 at 11:20 am.Advised to call back if that appointment does not work.

## 2021-04-22 NOTE — Telephone Encounter (Signed)
Patient's son is returning call. He requested that the call be returned to the patient at [phone].

## 2021-04-22 NOTE — Telephone Encounter (Signed)
Spoke to patient she stated she saw her PCP yesterday for weight loss.Stated she has lost appox 20 lbs within the past 6 months.Stated her appetite has increased and she eats more.Stated PCP told her she heard a heart murmur.Stated she has never been told she had a heart murmur.She would like to see Dr.Hochrein sooner.Appointment scheduled with Dr.Hoxchrein 10/20 at 9:30 am.I will make him aware.

## 2021-04-22 NOTE — Telephone Encounter (Signed)
Pt aware of new appt and message left with [facility] and if unable to bring pt to that appt to call and let us know .

## 2021-04-27 ENCOUNTER — Emergency Department (HOSPITAL_COMMUNITY): Payer: Medicare (Managed Care)

## 2021-04-27 ENCOUNTER — Encounter (HOSPITAL_COMMUNITY): Payer: Self-pay | Admitting: *Deleted

## 2021-04-27 ENCOUNTER — Emergency Department (HOSPITAL_COMMUNITY)
Admission: EM | Admit: 2021-04-27 | Discharge: 2021-04-27 | Disposition: A | Discharge status: Home / Self Care | Payer: Medicare (Managed Care) | Attending: Emergency Medicine | Admitting: Emergency Medicine

## 2021-04-27 ENCOUNTER — Other Ambulatory Visit: Payer: Self-pay

## 2021-04-27 DIAGNOSIS — J45909 Unspecified asthma, uncomplicated: Secondary | ICD-10-CM | POA: Diagnosis not present

## 2021-04-27 DIAGNOSIS — Z91010 Allergy to peanuts: Secondary | ICD-10-CM | POA: Insufficient documentation

## 2021-04-27 DIAGNOSIS — S329XXA Fracture of unspecified parts of lumbosacral spine and pelvis, initial encounter for closed fracture: Secondary | ICD-10-CM | POA: Diagnosis not present

## 2021-04-27 DIAGNOSIS — D72829 Elevated white blood cell count, unspecified: Secondary | ICD-10-CM | POA: Diagnosis not present

## 2021-04-27 DIAGNOSIS — W1839XA Other fall on same level, initial encounter: Secondary | ICD-10-CM | POA: Diagnosis not present

## 2021-04-27 DIAGNOSIS — I10 Essential (primary) hypertension: Secondary | ICD-10-CM | POA: Insufficient documentation

## 2021-04-27 DIAGNOSIS — I4891 Unspecified atrial fibrillation: Secondary | ICD-10-CM | POA: Diagnosis not present

## 2021-04-27 DIAGNOSIS — Y9389 Activity, other specified: Secondary | ICD-10-CM | POA: Diagnosis not present

## 2021-04-27 DIAGNOSIS — E039 Hypothyroidism, unspecified: Secondary | ICD-10-CM | POA: Diagnosis not present

## 2021-04-27 DIAGNOSIS — Z7901 Long term (current) use of anticoagulants: Secondary | ICD-10-CM | POA: Diagnosis not present

## 2021-04-27 DIAGNOSIS — Z79899 Other long term (current) drug therapy: Secondary | ICD-10-CM | POA: Insufficient documentation

## 2021-04-27 DIAGNOSIS — Y9289 Other specified places as the place of occurrence of the external cause: Secondary | ICD-10-CM | POA: Insufficient documentation

## 2021-04-27 DIAGNOSIS — S3993XA Unspecified injury of pelvis, initial encounter: Secondary | ICD-10-CM | POA: Diagnosis present

## 2021-04-27 LAB — BASIC METABOLIC PANEL
Anion gap: 7 (ref 5–15)
BUN: 13 mg/dL (ref 8–23)
CO2: 23 mmol/L (ref 22–32)
Calcium: 8.4 mg/dL — ABNORMAL LOW (ref 8.9–10.3)
Chloride: 107 mmol/L (ref 98–111)
Creatinine, Ser: 0.61 mg/dL (ref 0.44–1.00)
GFR, Estimated: >60 mL/min (ref >60)
Glucose, Bld: 132 mg/dL — ABNORMAL HIGH (ref 70–99)
Potassium: 3.4 mmol/L — ABNORMAL LOW (ref 3.5–5.1)
Sodium: 137 mmol/L (ref 135–145)

## 2021-04-27 LAB — CBC WITH DIFFERENTIAL/PLATELET
Abs Immature Granulocytes: 0.06 10*3/uL (ref 0.00–0.07)
Basophils Absolute: 0 10*3/uL (ref 0.0–0.1)
Basophils Relative: 0 %
Eosinophils Absolute: 0 10*3/uL (ref 0.0–0.5)
Eosinophils Relative: 0 %
HCT: 39 % (ref 36.0–46.0)
Hemoglobin: 12.5 g/dL (ref 12.0–15.0)
Immature Granulocytes: 1 %
Lymphocytes Relative: 6 %
Lymphs Abs: 0.7 10*3/uL (ref 0.7–4.0)
MCH: 30.7 pg (ref 26.0–34.0)
MCHC: 32.1 g/dL (ref 30.0–36.0)
MCV: 95.8 fL (ref 80.0–100.0)
Monocytes Absolute: 0.7 10*3/uL (ref 0.1–1.0)
Monocytes Relative: 6 %
Neutro Abs: 10.5 10*3/uL — ABNORMAL HIGH (ref 1.7–7.7)
Neutrophils Relative %: 87 %
Platelets: 179 10*3/uL (ref 150–400)
RBC: 4.07 MIL/uL (ref 3.87–5.11)
RDW: 13.2 % (ref 11.5–15.5)
WBC: 12.1 10*3/uL — ABNORMAL HIGH (ref 4.0–10.5)
nRBC: 0 % (ref 0.0–0.2)

## 2021-04-27 LAB — URINALYSIS, ROUTINE W REFLEX MICROSCOPIC
Bilirubin Urine: NEGATIVE
Glucose, UA: NEGATIVE mg/dL
Ketones, ur: NEGATIVE mg/dL
Leukocytes,Ua: NEGATIVE
Nitrite: POSITIVE — AB
Protein, ur: NEGATIVE mg/dL
Specific Gravity, Urine: 1.01 (ref 1.005–1.030)
pH: 6 (ref 5.0–8.0)

## 2021-04-27 NOTE — ED Provider Notes (Signed)
[facility] EMERGENCY DEPARTMENT Provider Note   CSN: [id number] Arrival date & time: 04/27/21  1347     History Chief Complaint  Patient presents with   Stacey Davenport Stacey Davenport is a 80 y.o. female.   Fall Pertinent negatives include no chest pain, no abdominal pain, no headaches and no shortness of breath.      Stacey Davenport is a 80 y.o. female with past medical history of asthma, atrial fibrillation, CVA 2013, hypertension,  who presents to the Emergency Department complaining of mechanical fall that occurred around 1 PM today.  She states that she was using her walker when she bent over to pick some papers up off the floor and fell back landing on her buttocks.  She complains of pain along her lower spine and right posterior hip area.  She was unable to stand without assistance after the fall.  She denies LOC head injury or use of anticoagulants.  She also denies headache, dizziness, visual changes, chest pain, abdominal pain or numbness or weakness of her lower extremities.  Past Medical History:  Diagnosis Date   Anxiety    Asthma    Atrial fibrillation ([place])    CVA (cerebral infarction)    a. 09/2011 MRI tiny bilat centrum semiovale acute non hemorrhagic infarcts   Esophageal stricture    Gastric polyp    Hyperplastic   Gastritis    GERD (gastroesophageal reflux disease)    Glaucoma    Hiatal hernia    Hyperlipidemia    Hypertension    Hypothyroidism    Osteoarthritis    Pneumonia    Rheumatoid arthritis(714.0)    Thyroid disease     Patient Active Problem List   Diagnosis Date Noted   Sagittal band rupture, extensor tendon, nontraumatic, left 12/30/2020   Irregular heart beat 08/15/2017   Primary open angle glaucoma of both eyes, mild stage 04/11/2017   High risk medication use 09/03/2013   Multiple drug allergies 09/03/2013   Osteoporosis, 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     Family History  Problem Relation Age of Onset   Diabetes Mother    Cancer Father        lymph nodes???   Diabetes Sister    Heart disease Sister    Colon cancer Neg Hx    Esophageal cancer Neg Hx     Social History   Tobacco Use   Smoking status: Never   Smokeless tobacco: Never  Vaping Use   Vaping Use: Never used  Substance Use Topics   Alcohol use: No    Alcohol/week: 0.0 standard drinks   Drug use: No    Home Medications Prior to Admission medications   Medication Sig Start Date End Date Taking? Authorizing Provider  amiodarone (PACERONE) 200 MG tablet Take 1 tablet (200 mg total) by mouth daily. 03/07/13   Minus Breeding, MD  brimonidine (ALPHAGAN) 0.2 % ophthalmic solution USE 1 DROP IN [facility] EYE  3 TIMES DAILY 06/11/15   Chipper Herb, MD  calcium carbonate (TUMS - DOSED IN MG ELEMENTAL CALCIUM) 500 MG chewable tablet Chew 1 tablet by mouth as needed.     [provider]  cetirizine (ZYRTEC) 10 MG tablet Take 10 mg by mouth daily. Patient not taking: Reported on 05/28/2020    [provider]  cholecalciferol (VITAMIN D) 1000 UNITS tablet Take 1,000 Units by mouth daily.     [provider]  ciclesonide (ALVESCO) 160 MCG/ACT inhaler Inhale 1 puff into the lungs 2 (two) times daily.    [provider]  diltiazem (TIAZAC) 180 MG 24 hr capsule Take 1 capsule (180 mg total) by mouth 2 (two) times daily. Take one tablet by mouth in the evening 03/17/16   Minus Breeding, MD  fluticasone [facility]) 50 MCG/ACT nasal  spray Place into both nostrils daily. Patient not taking: Reported on 05/28/2020    [provider]  latanoprost (XALATAN) 0.005 % ophthalmic solution PLACE 1 DROP IN [facility] EYE AT BEDTIME 06/11/15   Chipper Herb, MD  levalbuterol Penne Lash) 0.63 MG/3ML nebulizer solution Take 0.63 mg by nebulization every 8 (eight) hours as needed for wheezing or shortness of breath.    [provider]  levothyroxine (SYNTHROID, LEVOTHROID) 75 MCG tablet Take 75 mcg by mouth daily before breakfast. Take once daily 12/06/12   Chipper Herb, MD  loratadine (CLARITIN) 10 MG tablet Take 10 mg by mouth daily.    [provider]  LORazepam (ATIVAN) 0.5 MG tablet TAKE 1 TO 2 TABLETS EVERY DAY    Chipper Herb, MD  losartan (COZAAR) 25 MG tablet Take 25 mg by mouth daily. Take 1/2 tablet by mouth daily    [provider]  montelukast (SINGULAIR) 10 MG tablet Take 1 tablet by mouth every evening.    [provider]  predniSONE (DELTASONE) 5 MG tablet Take 5 mg by mouth. Take 1 1/2 tablet by mouth daily    [provider]  sertraline (ZOLOFT) 25 MG tablet Take 25 mg by mouth daily.    [provider]  warfarin (COUMADIN) 4 MG tablet Take 2 mg by mouth as directed. Take 2 mg Sunday and Thursday. Take 1.5  mg monday, Tuesday, Wednesday, Friday and Saturday 12/06/12 05/15/19  Chipper Herb, MD  [facility] HFA 45 MCG/ACT inhaler USE 2 PUFFS TWICE DAILY Patient not taking: Reported on 05/28/2020    Chipper Herb, MD  potassium chloride (K-DUR,KLOR-CON) 10 MEQ tablet Take 1 tablet (10 mEq total) by mouth daily. 12/06/12 10/11/13  Chipper Herb, MD    Allergies    Sulfamethoxazole, Aciphex Graciella [place] sodium], Amoxicillin, Biaxin [clarithromycin], Cephalexin, Esomeprazole magnesium, Hydrocodone, Hydrocodone-acetaminophen, Ketek [telithromycin], Latex, Levaquin [levofloxacin in d5w], Lipitor [atorvastatin], Macrobid [nitrofurantoin macrocrystal], Moxifloxacin, Nexium  [esomeprazole magnesium], Rabeprazole sodium, Sulfonamide derivatives, Vesicare [solifenacin], and Zithromax [azithromycin]  Review of Systems   Review of Systems  Constitutional:  Negative for chills and fever.  Eyes:  Negative for visual disturbance.  Respiratory:  Negative for chest tightness and shortness of breath.   Cardiovascular:  Negative for chest pain and leg swelling.  Gastrointestinal:  Negative for abdominal pain, nausea and vomiting.  Genitourinary:  Negative for difficulty urinating and flank pain.  Musculoskeletal:  Positive for arthralgias (right hip pain) and back pain (Low back pain). Negative for neck pain.  Skin:  Negative for color change, rash and wound.  Neurological:  Negative for dizziness, syncope, weakness, numbness and headaches.  Psychiatric/Behavioral:  Negative for confusion.    Physical Exam Updated Vital Signs BP (!) 180/73   Pulse 63   Temp 98.1 F (36.7 C)   Resp 20   Ht 5\' 3"  (1.6 m)   Wt 61.2 kg   SpO2 100%   BMI 23.91 kg/m   Physical Exam Vitals and nursing note reviewed.  Constitutional:      General: She is not in acute distress.    Appearance: Normal appearance. She is not ill-appearing.  HENT:     Head: Atraumatic.     Mouth/Throat:     Mouth: Mucous membranes are moist.  Eyes:     Extraocular Movements: Extraocular movements intact.     Conjunctiva/sclera: Conjunctivae normal.     Pupils: Pupils are equal, round, and reactive to light.  Neck:     Trachea: Phonation normal.  Cardiovascular:     Rate and Rhythm: Normal rate and regular rhythm.     Pulses: Normal pulses.  Pulmonary:     Effort: Pulmonary effort is normal.     Breath sounds: Normal breath sounds.  Chest:     Chest wall: No tenderness.  Abdominal:     Palpations: Abdomen is soft.     Tenderness: There is no abdominal tenderness.  Musculoskeletal:        General: Tenderness and signs of injury present. No swelling.     Cervical back: Full passive range of  motion without pain and normal range of motion. No tenderness.     Comments: Tenderness palpation along the coccyx area and right pelvis.  No shortening or external rotation of the right hip.  Skin:    General: Skin is warm.     Capillary Refill: Capillary refill takes less than 2 seconds.     Findings: No bruising or erythema.  Neurological:     General: No focal deficit present.     Mental Status: She is alert.     Sensory: Sensation is intact. No sensory deficit.     Motor: Motor function is intact. No weakness.     Comments: CN II through XII grossly intact.  Speech clear.  Mentating well.     ED Results / Procedures / Treatments   Labs (all labs ordered are listed, but only abnormal results are displayed) Labs Reviewed  BASIC METABOLIC PANEL - Abnormal; Notable for the following components:      Result Value   Potassium 3.4 (*)    Glucose, Bld 132 (*)    Calcium 8.4 (*)    All other components within normal limits  CBC WITH DIFFERENTIAL/PLATELET - Abnormal; Notable for the following components:   WBC 12.1 (*)    Neutro Abs 10.5 (*)    All other components within normal limits  URINALYSIS, ROUTINE W REFLEX MICROSCOPIC - Abnormal; Notable for the following components:   APPearance HAZY (*)    Hgb urine dipstick SMALL (*)    Nitrite POSITIVE (*)    Bacteria, UA MANY (*)    All other components within normal limits  URINE CULTURE    EKG None  Radiology DG Lumbar Spine Complete  Result Date: 04/27/2021 CLINICAL DATA:  Fall today after patient bent over to pick something up. Patient fell onto her bottom. Pain in the lower back and buttocks. EXAM: LUMBAR SPINE - COMPLETE 5 VIEW COMPARISON:  None. FINDINGS: Exam limited by the scoliosis and diffuse skeletal demineralization. No convincing fracture. Marked dextroscoliosis, apex at T10. Prominent levoscoliosis, apex at L4. No convincing spondylolisthesis. Moderate  to marked loss of disc height from the lower thoracic spine  through L4-L5. The L5-S1 disc space is well preserved. Aortic atherosclerotic calcifications. Right upper quadrant vascular clips from prior cholecystectomy. No acute soft tissue abnormality. IMPRESSION: 1. Limited study, but no convincing acute fracture or spondylolisthesis. 2. Diffuse disc degenerative changes and prominent thoracolumbar scoliosis. Skeletal structures are diffusely demineralized. Electronically Signed   By: Lajean Manes M.D.   On: 04/27/2021 16:32   DG Pelvis 1-2 Views  Result Date: 04/27/2021 CLINICAL DATA:  Patient fell today while bending over landing on her bottom. Complaining of back and buttock region pain. EXAM: PELVIS - 1 VIEW COMPARISON:  None. FINDINGS: No convincing fracture or bone lesion. Mid to lower sacrum is not well visualized due to overlying bowel gas diffuse skeletal demineralization. Hip joints, SI joints and pubic symphysis normally spaced and aligned. Soft tissues are unremarkable. IMPRESSION: 1. No convincing fracture, bone lesion or significant joint abnormality. 2. Mid to lower sacrum not well visualized. If symptoms are referable to the sacrum, this would be best assessed with unenhanced pelvis CT. Electronically Signed   By: Lajean Manes M.D.   On: 04/27/2021 16:34    Procedures Procedures   Medications Ordered in ED Medications - No data to display  ED Course  I have reviewed the triage vital signs and the nursing notes.  Pertinent labs & imaging results that were available during my care of the patient were reviewed by me and considered in my medical decision making (see chart for details).    MDM Rules/Calculators/A&P                           Patient here for evaluation of mechanical fall that occurred at home earlier today.  Fell backwards from a standing position onto her buttocks.  Complains of pain along her coccyx and left hip area.  Labs showed no significant electrolyte derangement.  Mild leukocytosis of 12,000.  Urinalysis shows  small amount of blood with positive nitrites, negative leukocytes and 0-5 white cells with many bacteria.  Urine culture pending.  Patient denies any urinary symptoms.  X-rays of the lumbar spine and pelvis without evidence of acute bony injury although L-spine study is limiting due to scoliosis and skeletal demineralization.  No obvious bony deformity seen on plain film of the pelvis.  Given mechanism of fall and patient age, will further evaluate with noncontrasted CT of the pelvis.  CT of the pelvis shows a buckle fracture of the right sacral ala at the S4 level.  Discussed findings with patient and family member at bedside.  Patient has ambulated to the restroom using a walker without difficulty.  No significant pain reported.  She uses a walker at baseline. Patient's son states that she has home health 4 days a week and supposed to be doing physical therapy but does not perform her exercises as she is recommended.  He is requesting that she be admitted for her fracture.  I have explained that she is weightbearing in fracture can be managed at home.  I have offered social work consult for SNF placement.  Patient and family member are agreeable to this plan.    Final Clinical Impression(s) / ED Diagnoses Final diagnoses:  Closed nondisplaced fracture of pelvis, unspecified part of pelvis, initial encounter [facility])    Rx / DC Orders ED Discharge Orders     None        Maranatha Grossi, PA-C  04/27/21 2343    Daleen Bo, MD 04/28/21 [phone]

## 2021-04-27 NOTE — ED Provider Notes (Signed)
Emergency Medicine Provider Triage Evaluation Note  Stacey Davenport , a 80 y.o. female  was evaluated in triage.  Pt complains of injury from fall.  She states that she was bending over to pick up a piece of paper, stumbled forward and landed on her buttocks.  She was unable to get up afterwards so called EMS.  She complains of general weakness for several days for which she is taking nutritional supplements without significant relief.  She reports frequent urination without dysuria.  She denies fever, chills, nausea or vomiting..  Review of Systems  Positive: Fall, weakness, pelvic pain Negative: No head or neck injury.  No shortness of breath, vomiting or fever.  Physical Exam  BP (!) 180/73   Pulse 63   Temp 98.1 F (36.7 C)   Resp 20   Ht 5\' 3"  (1.6 m)   Wt 61.2 kg   SpO2 100%   BMI 23.91 kg/m  Gen:   Awake, no distress.  Sitting comfortably in a wheelchair Resp:  Normal effort  MSK:   Moves extremities without difficulty able to move arms and legs equally.  With passive testing there is mild pain on hip motion, right side. Other:  Nontoxic appearance  Medical Decision Making  Medically screening exam initiated at 3:09 PM.  Appropriate orders placed.  Stacey Davenport was informed that the remainder of the evaluation will be completed by another provider, this initial triage assessment does not replace that evaluation, and the importance of remaining in the ED until their evaluation is complete.  Malaise with secondary fall, no serious injury suspected.   Daleen Bo, MD 04/27/21 1515

## 2021-04-27 NOTE — ED Provider Notes (Signed)
  Face-to-face evaluation   History: Patient fell while trying to pick up a piece of paper.  She injured her right lower back.  She could not get up without help, and presents [place] for evaluation.  She does not feel like she injured her head or neck.  She denies weakness, dizziness, nausea or vomiting.  Physical exam: Elderly, alert female.  She is calm and fairly comfortable, sitting in wheelchair.  She is able to mobilize both arms and legs, without limitation.  Back is mildly tender right lower.  No midline tenderness or step-off.  Medical screening examination/treatment/procedure(s) were conducted as a shared visit with non-physician practitioner(s) and myself.  I personally evaluated the patient during the encounter    Daleen Bo, MD 04/28/21 [phone]

## 2021-04-27 NOTE — ED Triage Notes (Signed)
Pt c/o fall today after she bend over to pick up something and lost her balance. She reports she fell straight down on her bottom. Denies hitting her head or use of blood thinners.

## 2021-04-27 NOTE — Discharge Instructions (Addendum)
CT of your pelvis this evening shows a small buckle fracture of your pelvis.  This will likely heal without surgery.  I recommend using your walker when standing or walking.  A social worker from the hospital will call you tomorrow to discuss possible placement to a rehab facility.  Take Tylenol every 4 hours as needed for pain.

## 2021-04-27 NOTE — ED Notes (Signed)
Pt ambulated in hall without difficulty.

## 2021-04-29 ENCOUNTER — Other Ambulatory Visit: Payer: Self-pay | Admitting: Hospitalist

## 2021-04-29 DIAGNOSIS — Z0000 Encounter for general adult medical examination without abnormal findings: Secondary | ICD-10-CM

## 2021-04-29 DIAGNOSIS — E559 Vitamin D deficiency, unspecified: Secondary | ICD-10-CM

## 2021-04-30 ENCOUNTER — Other Ambulatory Visit: Payer: Self-pay | Admitting: Hospitalist

## 2021-04-30 DIAGNOSIS — E559 Vitamin D deficiency, unspecified: Secondary | ICD-10-CM

## 2021-05-05 ENCOUNTER — Other Ambulatory Visit: Payer: Self-pay | Admitting: Family Medicine

## 2021-05-05 ENCOUNTER — Ambulatory Visit
Admission: RE | Admit: 2021-05-05 | Discharge: 2021-05-05 | Disposition: A | Discharge status: Home / Self Care | Payer: Medicare (Managed Care) | Source: Ambulatory Visit | Attending: Family Medicine | Admitting: Family Medicine

## 2021-05-05 ENCOUNTER — Other Ambulatory Visit: Payer: Self-pay

## 2021-05-05 DIAGNOSIS — Z0000 Encounter for general adult medical examination without abnormal findings: Secondary | ICD-10-CM

## 2021-05-14 ENCOUNTER — Ambulatory Visit: Payer: Medicare (Managed Care) | Admitting: Cardiology

## 2021-05-18 ENCOUNTER — Other Ambulatory Visit: Payer: Self-pay | Admitting: Hospitalist

## 2021-05-18 ENCOUNTER — Ambulatory Visit
Admission: RE | Admit: 2021-05-18 | Discharge: 2021-05-18 | Disposition: A | Discharge status: Home / Self Care | Payer: Medicare (Managed Care) | Source: Ambulatory Visit | Attending: Hospitalist | Admitting: Hospitalist

## 2021-05-18 ENCOUNTER — Other Ambulatory Visit: Payer: Self-pay

## 2021-05-18 DIAGNOSIS — E559 Vitamin D deficiency, unspecified: Secondary | ICD-10-CM

## 2021-05-18 MED ORDER — IOPAMIDOL (ISOVUE-370) INJECTION 76%
80.0000 mL | Freq: Once | INTRAVENOUS | Status: AC | PRN
  Administered 2021-05-18: 80 mL via INTRAVENOUS

## 2021-06-09 NOTE — Progress Notes (Signed)
Cardiology Office Note   Date:  53/61/4431   ID:  Twanisha, Foulk [birth year], MRN [id number]  PCP:  Janifer Adie, MD  Cardiologist:   Minus Breeding, MD   Chief Complaint  Patient presents with   Edema       History of Present Illness: Stacey Davenport is a 80 y.o. female who presents for followup of atrial fib.  This has been paroxysmal.  Since I last saw her she is had no new acute complaints.  She still lives in [place] in an apartment.  She has some home health help about 3 times per week.  She gets around with a walker in her home.  She has her feet on the ground quite a bit and she has had some lower extremity edema though she does not really notice this.  She is not having any new chest pressure, neck or arm discomfort.  She is not noticing any palpitations, presyncope or syncope.  She is seen at [facility].  I do see that they have changed her from warfarin to Eliquis.  She said she could not tolerate the Eliquis so she is most recently been on Xarelto.   Past Medical History:  Diagnosis Date   Anxiety    Asthma    Atrial fibrillation (HCC)    CVA (cerebral infarction)    a. 09/2011 MRI tiny bilat centrum semiovale acute non hemorrhagic infarcts   Esophageal stricture    Gastric polyp    Hyperplastic   Gastritis    GERD (gastroesophageal reflux disease)    Glaucoma    Hiatal hernia    Hyperlipidemia    Hypertension    Hypothyroidism    Osteoarthritis    Pneumonia    Rheumatoid arthritis(714.0)    Thyroid disease     Past Surgical History:  Procedure Laterality Date   APPENDECTOMY     BLEPHAROPLASTY Right    CATARACT EXTRACTION Bilateral    CHOLECYSTECTOMY     TUBAL LIGATION       Current Outpatient Medications  Medication Sig Dispense Refill   furosemide (LASIX) 20 MG tablet Take 1 tablet (20 mg total) by mouth daily for 3 days. 3 tablet 0   amiodarone (PACERONE) 200 MG tablet Take 1 tablet (200 mg total) by mouth daily. 30 tablet 11    brimonidine (ALPHAGAN) 0.2 % ophthalmic solution USE 1 DROP IN EACH EYE 3 TIMES DAILY 10 mL 0   calcium carbonate (TUMS - DOSED IN MG ELEMENTAL CALCIUM) 500 MG chewable tablet Chew 1 tablet by mouth as needed.      cetirizine (ZYRTEC) 10 MG tablet Take 10 mg by mouth daily. (Patient not taking: Reported on 05/28/2020)     cholecalciferol (VITAMIN D) 1000 UNITS tablet Take 1,000 Units by mouth daily.      ciclesonide (ALVESCO) 160 MCG/ACT inhaler Inhale 1 puff into the lungs 2 (two) times daily.     diltiazem (TIAZAC) 180 MG 24 hr capsule Take 1 capsule (180 mg total) by mouth 2 (two) times daily. Take one tablet by mouth in the evening 60 capsule 11   fluticasone (FLONASE) 50 MCG/ACT nasal spray Place into both nostrils daily. (Patient not taking: Reported on 05/28/2020)     latanoprost (XALATAN) 0.005 % ophthalmic solution PLACE 1 DROP IN [facility] EYE AT BEDTIME 2.5 mL 0   levalbuterol (XOPENEX) 0.63 MG/3ML nebulizer solution Take 0.63 mg by nebulization every 8 (eight) hours as needed for wheezing or shortness of breath.  levothyroxine (SYNTHROID, LEVOTHROID) 75 MCG tablet Take 75 mcg by mouth daily before breakfast. Take once daily     loratadine (CLARITIN) 10 MG tablet Take 10 mg by mouth daily.     LORazepam (ATIVAN) 0.5 MG tablet TAKE 1 TO 2 TABLETS EVERY DAY 60 tablet 1   losartan (COZAAR) 25 MG tablet Take 25 mg by mouth daily. Take 1/2 tablet by mouth daily     montelukast (SINGULAIR) 10 MG tablet Take 1 tablet by mouth every evening.     predniSONE (DELTASONE) 5 MG tablet Take 5 mg by mouth. Take 1 1/2 tablet by mouth daily     sertraline (ZOLOFT) 25 MG tablet Take 25 mg by mouth daily.     warfarin (COUMADIN) 4 MG tablet Take 2 mg by mouth as directed. Take 2 mg Sunday and Thursday. Take 1.5  mg monday, Tuesday, Wednesday, Friday and Saturday     XOPENEX HFA 45 MCG/ACT inhaler USE 2 PUFFS TWICE DAILY (Patient not taking: Reported on 05/28/2020) 15 g 2   No current facility-administered  medications for this visit.    Allergies:   Sulfamethoxazole, Aciphex [rabeprazole sodium], Amoxicillin, Biaxin [clarithromycin], Cephalexin, Esomeprazole magnesium, Hydrocodone, Hydrocodone-acetaminophen, Ketek [telithromycin], Latex, Levaquin [levofloxacin in d5w], Lipitor [atorvastatin], Macrobid [nitrofurantoin macrocrystal], Moxifloxacin, Nexium [esomeprazole magnesium], Rabeprazole sodium, Sulfonamide derivatives, Vesicare [solifenacin], and Zithromax [azithromycin]   ROS:  Please see the history of present illness.   Otherwise, review of systems are positive for none.   All other systems are reviewed and negative.    PHYSICAL EXAM: VS:  BP (!) 177/72   Pulse 64   Ht 5\' 3"  (1.6 m)   Wt 140 lb (63.5 kg)   SpO2 97%   BMI 24.80 kg/m  , BMI Body mass index is 24.8 kg/m. GEN:  No distress NECK:  No jugular venous distention at 90 degrees, waveform within normal limits, carotid upstroke brisk and symmetric, no bruits, no thyromegaly LYMPHATICS:  No cervical adenopathy LUNGS:  Clear to auscultation bilaterally BACK:  No CVA tenderness CHEST:  Unremarkable HEART:  S1 and S2 within normal limits, no S3, no S4, no clicks, no rubs, 3 of 6 apical systolic murmur radiating slightly at the aortic outflow tract, no diastolic murmurs ABD:  Positive bowel sounds normal in frequency in pitch, no bruits, no rebound, no guarding, unable to assess midline mass or bruit with the patient seated. EXT:  2 plus pulses throughout, severe calf and pretibial edema, no cyanosis no clubbing SKIN:  No rashes no nodules NEURO:  Cranial nerves II through XII grossly intact, motor grossly intact throughout PSYCH:  Cognitively intact, oriented to person place and time   EKG:  EKG is  ordered today. The ekg ordered today demonstrates sinus rhythm, rate 64, axis within normal limits, intervals within normal limits, poor anterior R wave progression.  Significant baseline artifact   Recent Labs: 04/27/2021: BUN 13;  Creatinine, Ser 0.61; Hemoglobin 12.5; Platelets 179; Potassium 3.4; Sodium 137    Lipid Panel    Component Value Date/Time   CHOL 175 10/16/2013 1142   CHOL 198 12/06/2012 1040   TRIG 81 10/16/2013 1142   TRIG 94 12/06/2012 1040   HDL 69 10/16/2013 1142   HDL 88 12/06/2012 1040   CHOLHDL 1.8 09/30/2011 1610   VLDL 16 09/30/2011 1610   LDLCALC 90 10/16/2013 1142   LDLCALC 91 12/06/2012 1040      Wt Readings from Last 3 Encounters:  06/11/21 140 lb (63.5 kg)  04/27/21 134 lb  15.8 oz (61.2 kg)  05/28/20 166 lb 9.6 oz (75.6 kg)      Other studies Reviewed: Additional studies/ records that were reviewed today include: Labs. Review of the above records demonstrates:  Please see elsewhere in the note.     ASSESSMENT AND PLAN:  ATRIAL FIB:      She was quite symptomatic with atrial fibrillation in 2013.  I think it is prudent to continue her amiodarone.   I do not see the most recent TSH or liver enzymes and I will order this today.  She continues on her anticoagulation.   HTN:    Her blood pressure is elevated.  I would request that she get her blood pressures checked by her aides at home and we could increase her Cozaar if in fact this is not just whitecoat hypertension.  She has had whitecoat hypertension in the past.   LEG SWELLING: Her leg swelling is significant today and I am going to give her 3 days of 20 mg of Lasix and have her keep her feet elevated.  Current medicines are reviewed at length with the patient today.  The patient does not have concerns regarding medicines.  The following changes have been made: As above  Labs/ tests ordered today include: None  Orders Placed This Encounter  Procedures   Comprehensive metabolic panel   TSH   EKG 12-Lead      Disposition:   FU with me 6 months in [place], Minus Breeding, MD  06/11/2021 12:51 PM    [place]

## 2021-06-10 ENCOUNTER — Telehealth: Payer: Self-pay | Admitting: Cardiology

## 2021-06-10 NOTE — Telephone Encounter (Signed)
New Message:    Geni Bers from Dr Gasper Sells 's office called. Dr Gasper Sells wants Dr Percival Spanish to know that patient's Coumadin have been discontinued. It was stopped, because of her difficulty in managing her dosages.e. She was started on Xarelto on 06-09-21. She is not on board with this,but Dr Timoteo Ace would like Dr Percival Spanish support and encouragement with this please.

## 2021-06-10 NOTE — Telephone Encounter (Signed)
Stacey Davenport from Dr Gasper Sells 's office called. Dr Gasper Sells wants Dr Percival Spanish to know that patient's Coumadin have been discontinued. It was stopped, because of her difficulty in managing her dosages.e. She was started on Xarelto on 06-09-21. She is not on board with this,but Dr Timoteo Ace would like Dr Percival Spanish support and encouragement with this please. This was forwarded to Dr. Percival Spanish.

## 2021-06-10 NOTE — Telephone Encounter (Signed)
Patient has an appointment with dr Percival Spanish 06/11/21.

## 2021-06-11 ENCOUNTER — Other Ambulatory Visit: Payer: Self-pay

## 2021-06-11 ENCOUNTER — Encounter: Payer: Self-pay | Admitting: Cardiology

## 2021-06-11 ENCOUNTER — Ambulatory Visit (INDEPENDENT_AMBULATORY_CARE_PROVIDER_SITE_OTHER): Payer: Medicare (Managed Care) | Admitting: Cardiology

## 2021-06-11 VITALS — BP 177/72 | HR 64 | Ht 63.0 in | Wt 140.0 lb

## 2021-06-11 DIAGNOSIS — I480 Paroxysmal atrial fibrillation: Secondary | ICD-10-CM | POA: Diagnosis not present

## 2021-06-11 DIAGNOSIS — I10 Essential (primary) hypertension: Secondary | ICD-10-CM

## 2021-06-11 MED ORDER — FUROSEMIDE 20 MG PO TABS: 20.0000 mg | ORAL_TABLET | Freq: Every day | ORAL | 0 refills | Status: DC

## 2021-06-11 NOTE — Patient Instructions (Signed)
Medication Instructions:   -Take furosemide (lasix) 20mg  for 3 days.   *If you need a refill on your cardiac medications before your next appointment, please call your pharmacy*   Lab Work: Your physician recommends that you have labs drawn today: CMET and TSH  If you have labs (blood work) drawn today and your tests are completely normal, you will receive your results only by: [place] (if you have MyChart) OR A paper copy in the mail If you have any lab test that is abnormal or we need to change your treatment, we will call you to review the results.   Follow-Up: At [facility], you and your health needs are our priority.  As part of our continuing mission to provide you with exceptional heart care, we have created designated Provider Care Teams.  These Care Teams include your primary Cardiologist (physician) and Advanced Practice Providers (APPs -  Physician Assistants and Nurse Practitioners) who all work together to provide you with the care you need, when you need it.  We recommend signing up for the patient portal called "MyChart".  Sign up information is provided on this After Visit Summary.  MyChart is used to connect with patients for Virtual Visits (Telemedicine).  Patients are able to view lab/test results, encounter notes, upcoming appointments, etc.  Non-urgent messages can be sent to your provider as well.   To learn more about what you can do with MyChart, go to [email or web address].    Your next appointment:   6 month(s)  The format for your next appointment:   In Person  Provider:   Minus Breeding, MD at the [facility] office.

## 2021-06-12 LAB — COMPREHENSIVE METABOLIC PANEL
ALT: 18 IU/L (ref 0–32)
AST: 30 IU/L (ref 0–40)
Albumin/Globulin Ratio: 1.9 (ref 1.2–2.2)
Albumin: 4.1 g/dL (ref 3.6–4.6)
Alkaline Phosphatase: 87 IU/L (ref 44–121)
BUN/Creatinine Ratio: 14 (ref 12–28)
BUN: 10 mg/dL (ref 8–27)
Bilirubin Total: 0.4 mg/dL (ref 0.0–1.2)
CO2: 22 mmol/L (ref 20–29)
Calcium: 9 mg/dL (ref 8.7–10.3)
Chloride: 98 mmol/L (ref 96–106)
Creatinine, Ser: 0.72 mg/dL (ref 0.57–1.00)
Globulin, Total: 2.2 g/dL (ref 1.5–4.5)
Glucose: 91 mg/dL (ref 70–99)
Potassium: 4.9 mmol/L (ref 3.5–5.2)
Sodium: 135 mmol/L (ref 134–144)
Total Protein: 6.3 g/dL (ref 6.0–8.5)
eGFR: 84 mL/min/{1.73_m2} (ref >59)

## 2021-06-12 LAB — TSH: TSH: 1.92 u[IU]/mL (ref 0.450–4.500)

## 2021-06-15 ENCOUNTER — Encounter: Payer: Self-pay | Admitting: *Deleted

## 2021-06-24 ENCOUNTER — Ambulatory Visit: Payer: Medicare (Managed Care) | Admitting: Cardiology

## 2021-10-16 IMAGING — CT CT PELVIS W/O CM
2 of 5 series · 17 of 46 positions shown, 19 images · non-contrast
Comparison: Pelvis x-ray 04/27/2021.

CLINICAL DATA: Pelvic trauma after fall.

EXAM:
CT PELVIS WITHOUT CONTRAST
TECHNIQUE: Multidetector CT imaging of the pelvis was performed following the
standard protocol without intravenous contrast.

[Series 5: 3 axial soft · axial · 0.79mm/px · z∈[+840,+1128]mm · 14 of 166 slices shown, 16 images]
[im 11/166  soft-tissue]
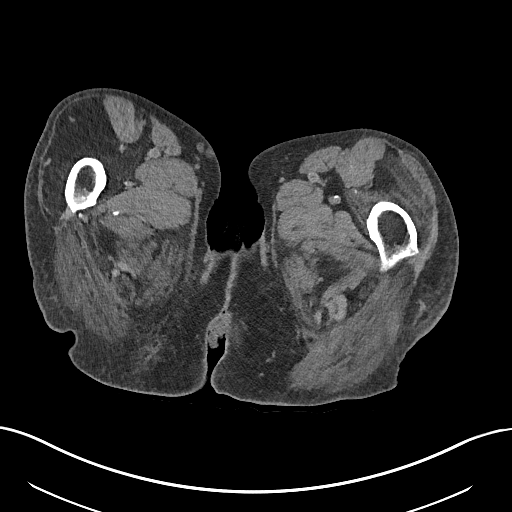
[im 11/166  bone]
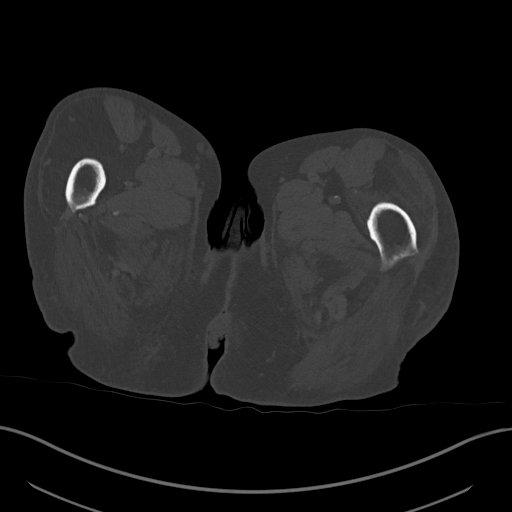
[im 22/166  soft-tissue]
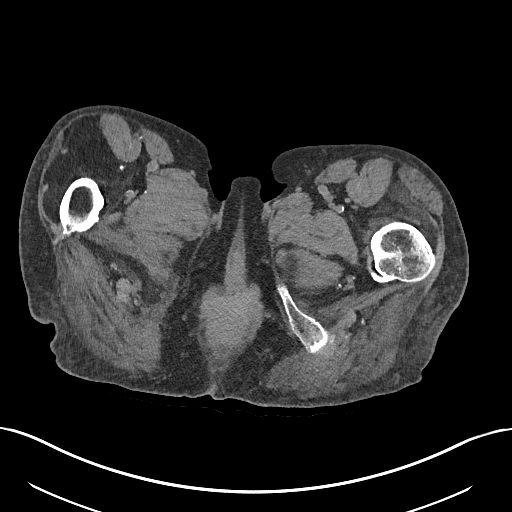
[im 32/166  soft-tissue]
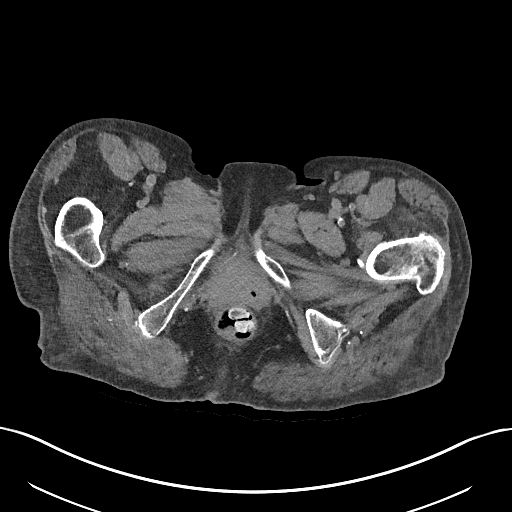
[im 43/166  soft-tissue]
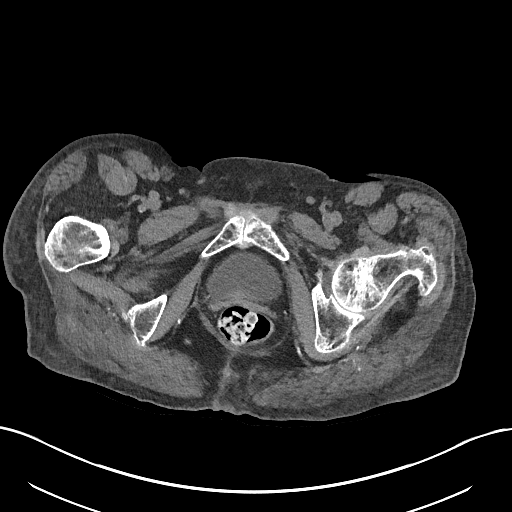
[im 54/166  soft-tissue]
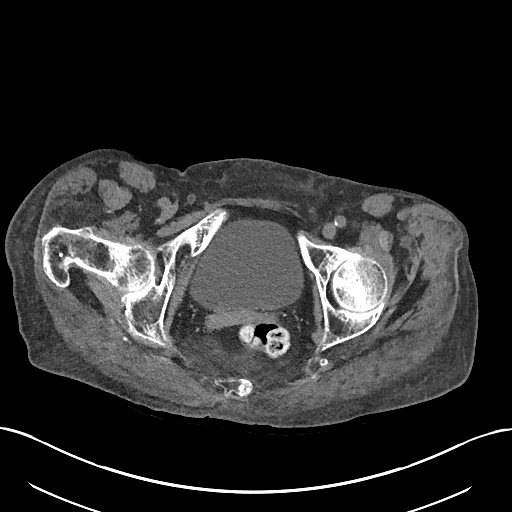
[im 64/166  soft-tissue]
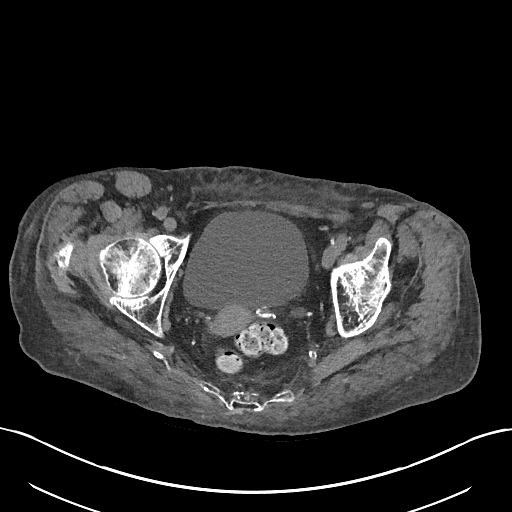
[im 75/166  soft-tissue]
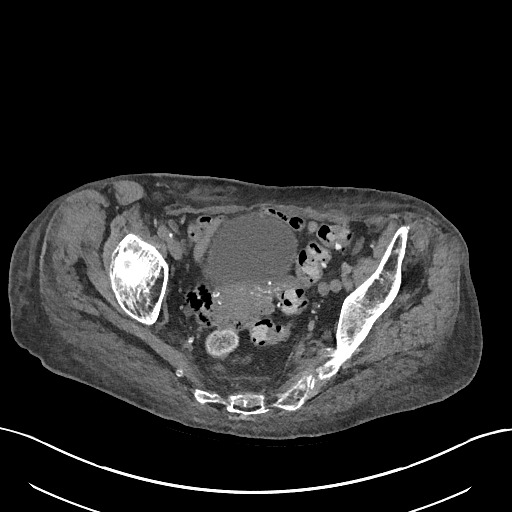
[im 91/166  soft-tissue]
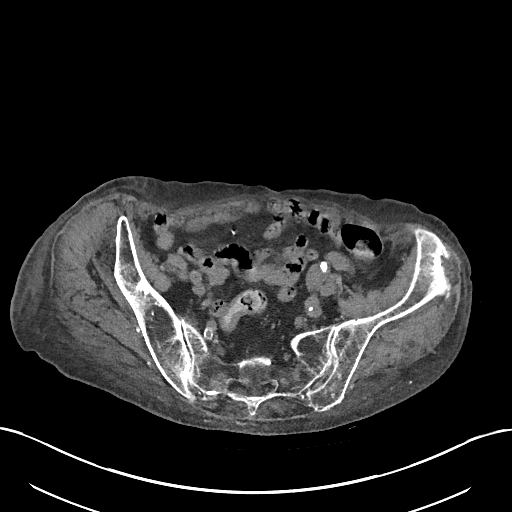
[im 102/166  soft-tissue]
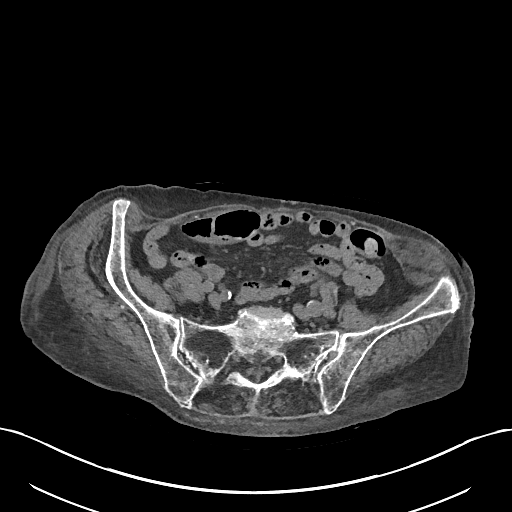
[im 102/166  bone]
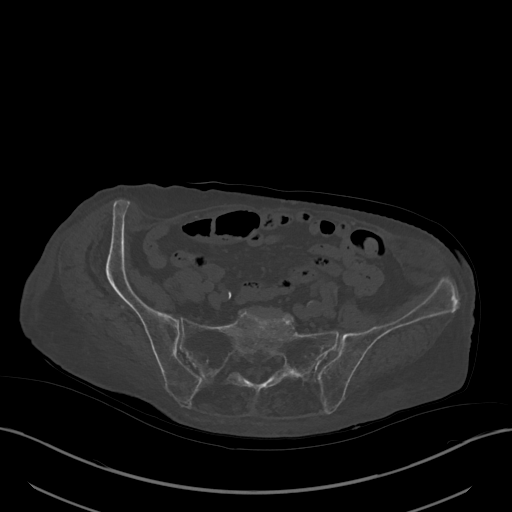
[im 112/166  soft-tissue]
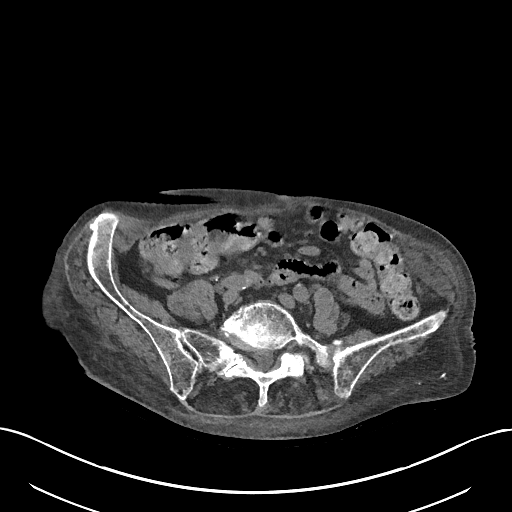
[im 123/166  soft-tissue]
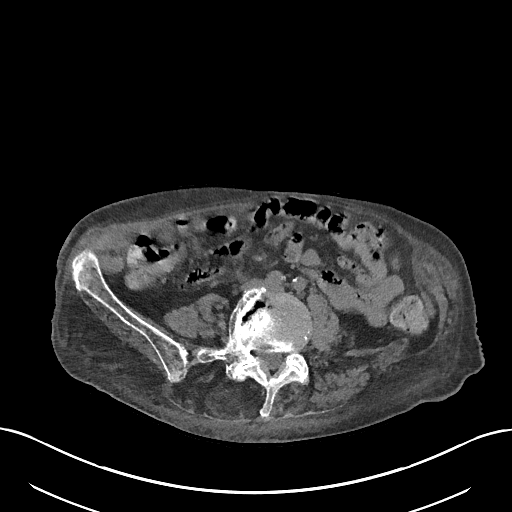
[im 134/166  soft-tissue]
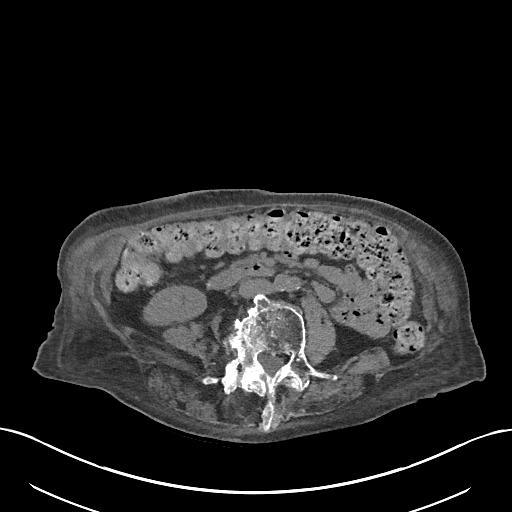
[im 144/166  soft-tissue]
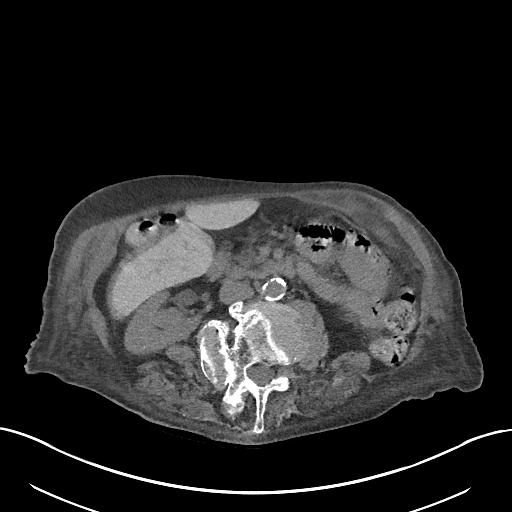
[im 155/166  soft-tissue]
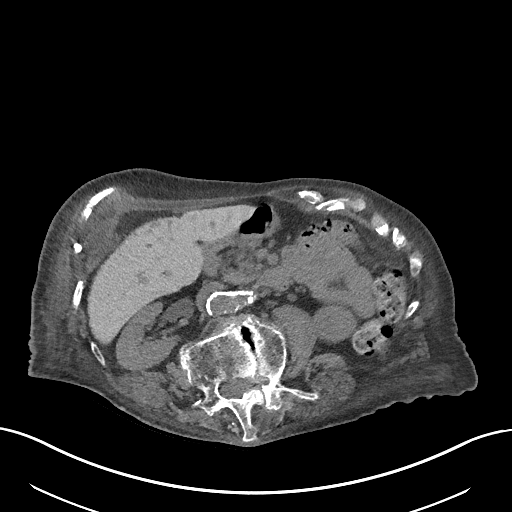

[Series 8: coronal st · coronal · 0.74mm/px · 3 of 147 slices shown]
[im 37/147  soft-tissue]
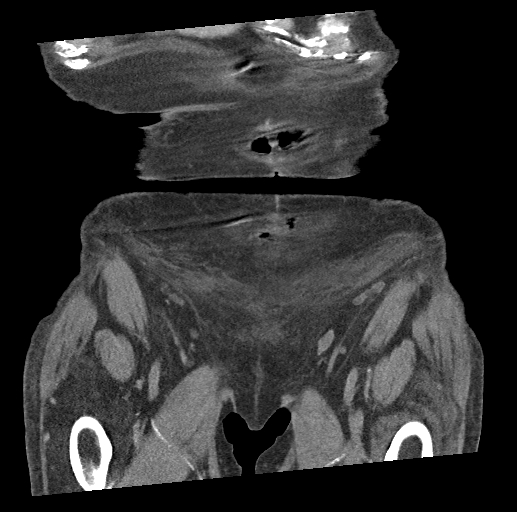
[im 74/147  soft-tissue]
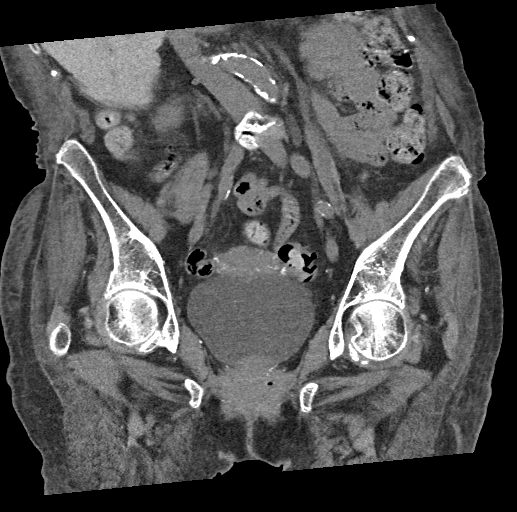
[im 110/147  soft-tissue]
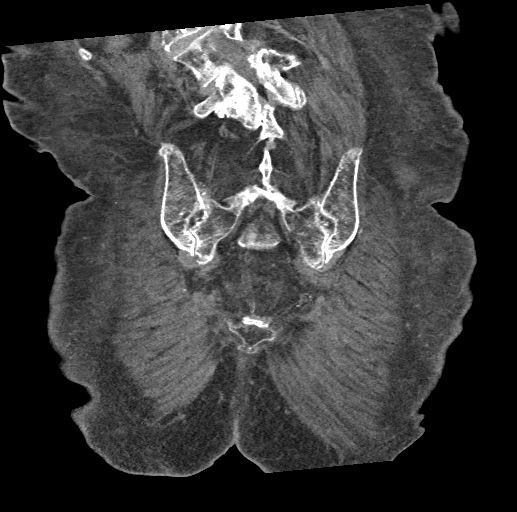

[17 of 46 positions shown; findings below may reference images not displayed]

FINDINGS: Urinary Tract: 1 cm right renal calculus. No hydronephrosis. Bladder
within normal limits.

Bowel:  Unremarkable visualized pelvic bowel loops.

Vascular/Lymphatic: No pathologically enlarged lymph nodes. There
are atherosclerotic calcifications of the aorta and peripheral
vessels.

Reproductive:  No mass or other significant abnormality

Other:  No pelvic free fluid.

Musculoskeletal: There is diffuse body wall edema. There is no focal
hematoma. The bones are diffusely osteopenic. This limits evaluation
for subtle nondisplaced fracture. There is a questionable subtle
nondisplaced focal deformity of the right sacral ala at the S4
level. This may represent acute nondisplaced fracture. No other
fractures are visualized. Severe degenerative changes affect the
lumbar spine. There is scoliosis of the lumbar spine.
IMPRESSION: 1. Questionable subtle acute buckle fracture of the right sacral ala
at the S4 level.
2. Body wall edema.  No focal hematoma.
3. Right renal calculus.
4. Diffuse osteopenia.

## 2021-10-16 IMAGING — DX DG LUMBAR SPINE COMPLETE 4+V
5 series · 5 of 5 positions shown · non-contrast
Comparison: None.

CLINICAL DATA: Fall today after patient bent over to pick something
up. Patient fell onto her bottom. Pain in the lower back and
buttocks.

EXAM:
LUMBAR SPINE - COMPLETE 5 VIEW

[l-spine ap]
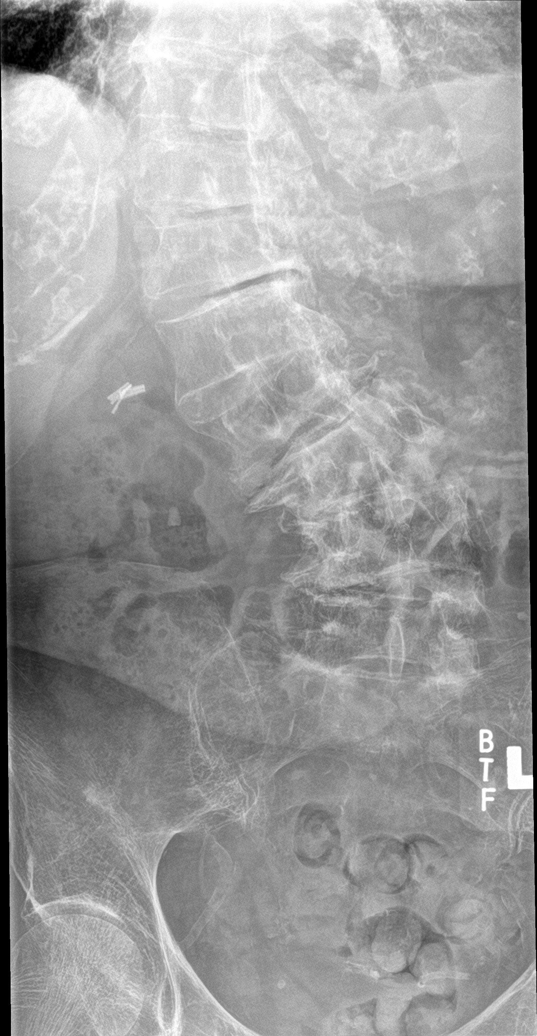

[l-spine obl (1 of 2)]
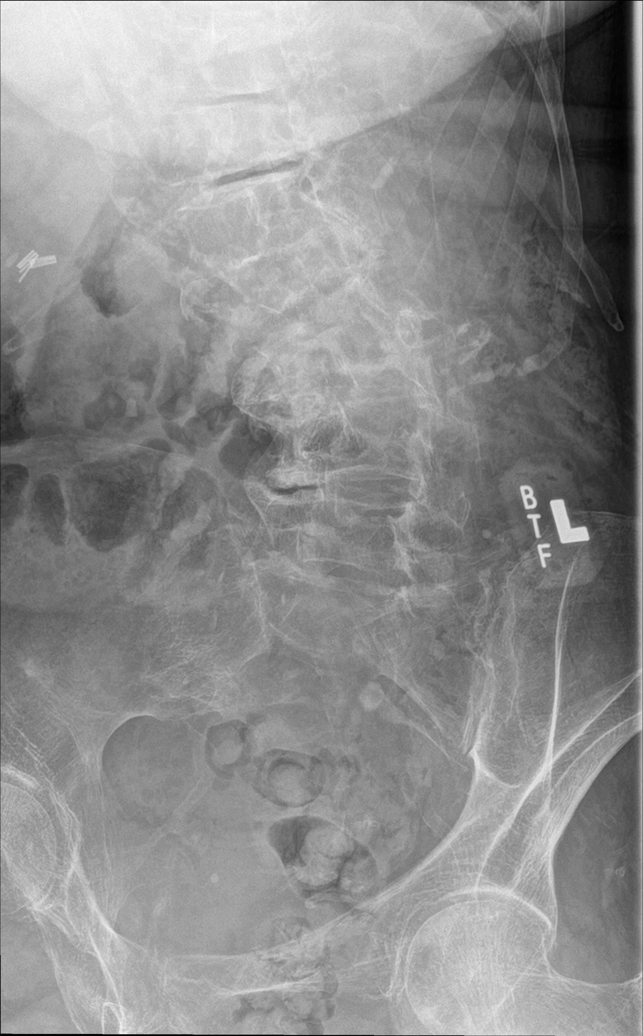

[l-spine obl (2 of 2)]
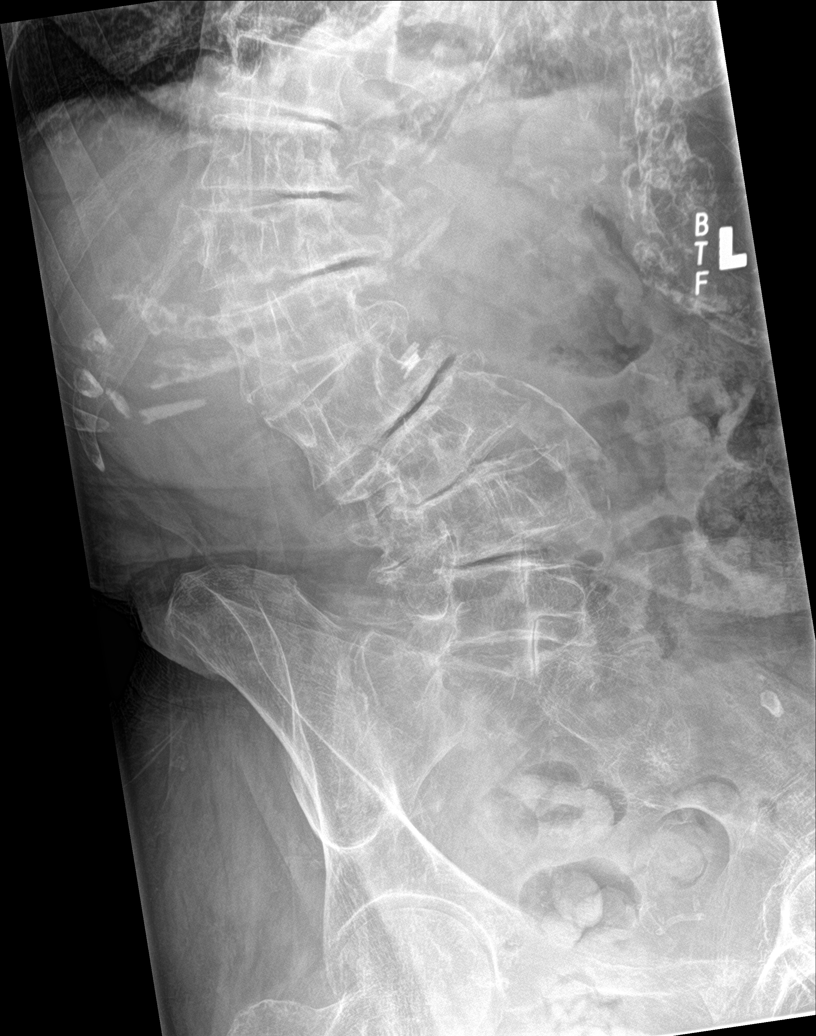

[l-spine lat]
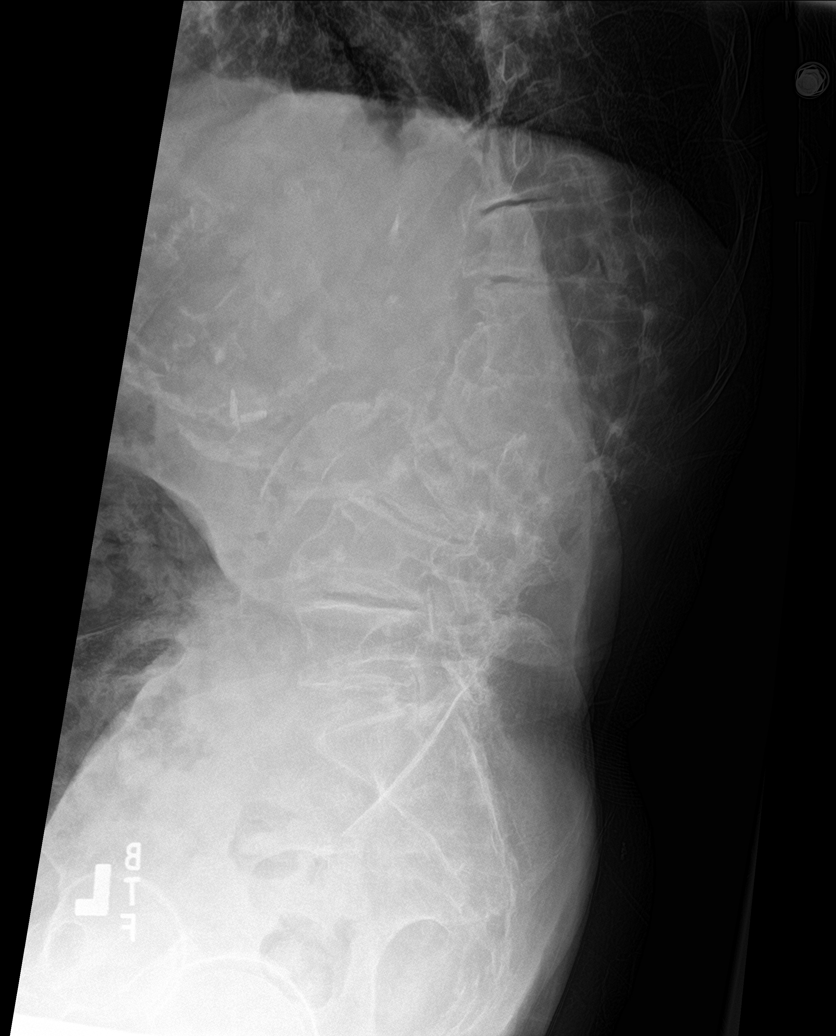

[l-spine spot]
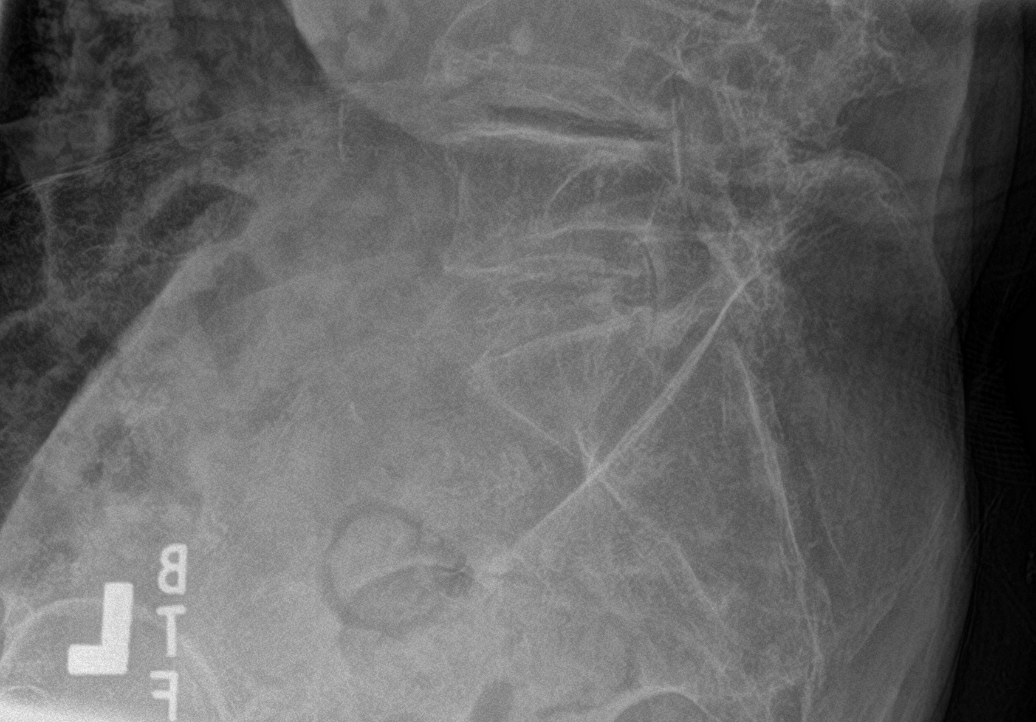

[5 of 5 positions shown; findings below may reference images not displayed]

FINDINGS: Exam limited by the scoliosis and diffuse skeletal demineralization.

No convincing fracture.

Marked dextroscoliosis, apex at T10. Prominent levoscoliosis, apex
at L4.

No convincing spondylolisthesis.

Moderate to marked loss of disc height from the lower thoracic spine
through L4-L5. The L5-S1 disc space is well preserved.

Aortic atherosclerotic calcifications. Right upper quadrant vascular
clips from prior cholecystectomy. No acute soft tissue abnormality.
IMPRESSION: 1. Limited study, but no convincing acute fracture or
spondylolisthesis.
2. Diffuse disc degenerative changes and prominent thoracolumbar
scoliosis. Skeletal structures are diffusely demineralized.

## 2021-10-21 ENCOUNTER — Other Ambulatory Visit: Payer: Self-pay

## 2021-10-21 ENCOUNTER — Inpatient Hospital Stay (HOSPITAL_COMMUNITY)
Admission: EM | Admit: 2021-10-21 | Discharge: 2021-10-26 | DRG: 812 | Disposition: A | Discharge status: Home Health | Payer: Medicare (Managed Care) | Attending: Family Medicine | Admitting: Family Medicine

## 2021-10-21 ENCOUNTER — Emergency Department (HOSPITAL_COMMUNITY): Payer: Medicare (Managed Care)

## 2021-10-21 ENCOUNTER — Encounter (HOSPITAL_COMMUNITY): Payer: Self-pay | Admitting: Emergency Medicine

## 2021-10-21 DIAGNOSIS — Z882 Allergy status to sulfonamides status: Secondary | ICD-10-CM

## 2021-10-21 DIAGNOSIS — Z8249 Family history of ischemic heart disease and other diseases of the circulatory system: Secondary | ICD-10-CM

## 2021-10-21 DIAGNOSIS — R7989 Other specified abnormal findings of blood chemistry: Secondary | ICD-10-CM | POA: Diagnosis present

## 2021-10-21 DIAGNOSIS — D649 Anemia, unspecified: Secondary | ICD-10-CM | POA: Diagnosis present

## 2021-10-21 DIAGNOSIS — I878 Other specified disorders of veins: Secondary | ICD-10-CM | POA: Diagnosis present

## 2021-10-21 DIAGNOSIS — Z28310 Unvaccinated for covid-19: Secondary | ICD-10-CM

## 2021-10-21 DIAGNOSIS — I10 Essential (primary) hypertension: Secondary | ICD-10-CM | POA: Diagnosis present

## 2021-10-21 DIAGNOSIS — N390 Urinary tract infection, site not specified: Secondary | ICD-10-CM | POA: Diagnosis not present

## 2021-10-21 DIAGNOSIS — J45909 Unspecified asthma, uncomplicated: Secondary | ICD-10-CM | POA: Diagnosis present

## 2021-10-21 DIAGNOSIS — K449 Diaphragmatic hernia without obstruction or gangrene: Secondary | ICD-10-CM | POA: Diagnosis present

## 2021-10-21 DIAGNOSIS — I509 Heart failure, unspecified: Secondary | ICD-10-CM | POA: Diagnosis present

## 2021-10-21 DIAGNOSIS — E039 Hypothyroidism, unspecified: Secondary | ICD-10-CM | POA: Diagnosis present

## 2021-10-21 DIAGNOSIS — K922 Gastrointestinal hemorrhage, unspecified: Secondary | ICD-10-CM | POA: Diagnosis present

## 2021-10-21 DIAGNOSIS — E441 Mild protein-calorie malnutrition: Secondary | ICD-10-CM | POA: Diagnosis present

## 2021-10-21 DIAGNOSIS — Z7901 Long term (current) use of anticoagulants: Secondary | ICD-10-CM

## 2021-10-21 DIAGNOSIS — D62 Acute posthemorrhagic anemia: Secondary | ICD-10-CM | POA: Diagnosis not present

## 2021-10-21 DIAGNOSIS — E785 Hyperlipidemia, unspecified: Secondary | ICD-10-CM | POA: Diagnosis present

## 2021-10-21 DIAGNOSIS — F419 Anxiety disorder, unspecified: Secondary | ICD-10-CM | POA: Diagnosis present

## 2021-10-21 DIAGNOSIS — K2950 Unspecified chronic gastritis without bleeding: Secondary | ICD-10-CM | POA: Diagnosis present

## 2021-10-21 DIAGNOSIS — Z8673 Personal history of transient ischemic attack (TIA), and cerebral infarction without residual deficits: Secondary | ICD-10-CM

## 2021-10-21 DIAGNOSIS — K921 Melena: Secondary | ICD-10-CM | POA: Diagnosis present

## 2021-10-21 DIAGNOSIS — Z79899 Other long term (current) drug therapy: Secondary | ICD-10-CM

## 2021-10-21 DIAGNOSIS — Z881 Allergy status to other antibiotic agents status: Secondary | ICD-10-CM

## 2021-10-21 DIAGNOSIS — F32A Depression, unspecified: Secondary | ICD-10-CM | POA: Diagnosis present

## 2021-10-21 DIAGNOSIS — Z885 Allergy status to narcotic agent status: Secondary | ICD-10-CM

## 2021-10-21 DIAGNOSIS — Z9049 Acquired absence of other specified parts of digestive tract: Secondary | ICD-10-CM

## 2021-10-21 DIAGNOSIS — Z79890 Hormone replacement therapy (postmenopausal): Secondary | ICD-10-CM

## 2021-10-21 DIAGNOSIS — Z7952 Long term (current) use of systemic steroids: Secondary | ICD-10-CM

## 2021-10-21 DIAGNOSIS — M069 Rheumatoid arthritis, unspecified: Secondary | ICD-10-CM | POA: Diagnosis present

## 2021-10-21 DIAGNOSIS — Z91040 Latex allergy status: Secondary | ICD-10-CM

## 2021-10-21 DIAGNOSIS — H409 Unspecified glaucoma: Secondary | ICD-10-CM | POA: Diagnosis present

## 2021-10-21 DIAGNOSIS — I110 Hypertensive heart disease with heart failure: Secondary | ICD-10-CM | POA: Diagnosis present

## 2021-10-21 DIAGNOSIS — M1990 Unspecified osteoarthritis, unspecified site: Secondary | ICD-10-CM | POA: Diagnosis present

## 2021-10-21 DIAGNOSIS — K219 Gastro-esophageal reflux disease without esophagitis: Secondary | ICD-10-CM | POA: Diagnosis present

## 2021-10-21 DIAGNOSIS — Z888 Allergy status to other drugs, medicaments and biological substances status: Secondary | ICD-10-CM

## 2021-10-21 DIAGNOSIS — K317 Polyp of stomach and duodenum: Secondary | ICD-10-CM | POA: Diagnosis present

## 2021-10-21 DIAGNOSIS — I4891 Unspecified atrial fibrillation: Secondary | ICD-10-CM | POA: Diagnosis present

## 2021-10-21 MED ORDER — IPRATROPIUM-ALBUTEROL 0.5-2.5 (3) MG/3ML IN SOLN
3.0000 mL | Freq: Once | RESPIRATORY_TRACT | Status: AC
  Administered 2021-10-22: 3 mL via RESPIRATORY_TRACT
  Filled 2021-10-21: qty 3

## 2021-10-21 MED ORDER — PREDNISONE 50 MG PO TABS
60.0000 mg | ORAL_TABLET | Freq: Once | ORAL | Status: AC
  Administered 2021-10-21: 60 mg via ORAL
  Filled 2021-10-21: qty 1

## 2021-10-21 NOTE — ED Triage Notes (Signed)
Pt arrives [facility] from home c/o shortness of breath for the past 3 weeks. States SOB increased over the past 3 days. Pt 95% on RA. ?

## 2021-10-22 ENCOUNTER — Encounter (HOSPITAL_COMMUNITY): Payer: Self-pay | Admitting: Family Medicine

## 2021-10-22 ENCOUNTER — Inpatient Hospital Stay (HOSPITAL_COMMUNITY): Payer: Medicare (Managed Care) | Admitting: Certified Registered"

## 2021-10-22 ENCOUNTER — Inpatient Hospital Stay (HOSPITAL_COMMUNITY): Payer: Medicare (Managed Care)

## 2021-10-22 ENCOUNTER — Encounter (HOSPITAL_COMMUNITY)
Admission: EM | Disposition: A | Discharge status: Home Health | Payer: Self-pay | Source: Home / Self Care | Attending: Family Medicine

## 2021-10-22 DIAGNOSIS — E785 Hyperlipidemia, unspecified: Secondary | ICD-10-CM | POA: Diagnosis present

## 2021-10-22 DIAGNOSIS — E039 Hypothyroidism, unspecified: Secondary | ICD-10-CM | POA: Diagnosis present

## 2021-10-22 DIAGNOSIS — F419 Anxiety disorder, unspecified: Secondary | ICD-10-CM | POA: Diagnosis present

## 2021-10-22 DIAGNOSIS — K921 Melena: Secondary | ICD-10-CM | POA: Diagnosis present

## 2021-10-22 DIAGNOSIS — N390 Urinary tract infection, site not specified: Secondary | ICD-10-CM | POA: Diagnosis not present

## 2021-10-22 DIAGNOSIS — Z885 Allergy status to narcotic agent status: Secondary | ICD-10-CM | POA: Diagnosis not present

## 2021-10-22 DIAGNOSIS — I10 Essential (primary) hypertension: Secondary | ICD-10-CM

## 2021-10-22 DIAGNOSIS — K317 Polyp of stomach and duodenum: Secondary | ICD-10-CM | POA: Diagnosis present

## 2021-10-22 DIAGNOSIS — K2950 Unspecified chronic gastritis without bleeding: Secondary | ICD-10-CM | POA: Diagnosis present

## 2021-10-22 DIAGNOSIS — I480 Paroxysmal atrial fibrillation: Secondary | ICD-10-CM | POA: Diagnosis not present

## 2021-10-22 DIAGNOSIS — K219 Gastro-esophageal reflux disease without esophagitis: Secondary | ICD-10-CM | POA: Diagnosis present

## 2021-10-22 DIAGNOSIS — I878 Other specified disorders of veins: Secondary | ICD-10-CM | POA: Diagnosis present

## 2021-10-22 DIAGNOSIS — D638 Anemia in other chronic diseases classified elsewhere: Secondary | ICD-10-CM

## 2021-10-22 DIAGNOSIS — R7989 Other specified abnormal findings of blood chemistry: Secondary | ICD-10-CM | POA: Diagnosis present

## 2021-10-22 DIAGNOSIS — F32A Depression, unspecified: Secondary | ICD-10-CM

## 2021-10-22 DIAGNOSIS — Z7901 Long term (current) use of anticoagulants: Secondary | ICD-10-CM

## 2021-10-22 DIAGNOSIS — D649 Anemia, unspecified: Secondary | ICD-10-CM | POA: Diagnosis present

## 2021-10-22 DIAGNOSIS — K449 Diaphragmatic hernia without obstruction or gangrene: Secondary | ICD-10-CM | POA: Diagnosis present

## 2021-10-22 DIAGNOSIS — H409 Unspecified glaucoma: Secondary | ICD-10-CM | POA: Diagnosis present

## 2021-10-22 DIAGNOSIS — M069 Rheumatoid arthritis, unspecified: Secondary | ICD-10-CM | POA: Diagnosis present

## 2021-10-22 DIAGNOSIS — Z882 Allergy status to sulfonamides status: Secondary | ICD-10-CM | POA: Diagnosis not present

## 2021-10-22 DIAGNOSIS — E441 Mild protein-calorie malnutrition: Secondary | ICD-10-CM | POA: Diagnosis present

## 2021-10-22 DIAGNOSIS — D62 Acute posthemorrhagic anemia: Secondary | ICD-10-CM | POA: Diagnosis present

## 2021-10-22 DIAGNOSIS — R0602 Shortness of breath: Secondary | ICD-10-CM | POA: Diagnosis not present

## 2021-10-22 DIAGNOSIS — I110 Hypertensive heart disease with heart failure: Secondary | ICD-10-CM | POA: Diagnosis present

## 2021-10-22 DIAGNOSIS — Z8673 Personal history of transient ischemic attack (TIA), and cerebral infarction without residual deficits: Secondary | ICD-10-CM | POA: Diagnosis not present

## 2021-10-22 DIAGNOSIS — Z28310 Unvaccinated for covid-19: Secondary | ICD-10-CM | POA: Diagnosis not present

## 2021-10-22 DIAGNOSIS — Z9049 Acquired absence of other specified parts of digestive tract: Secondary | ICD-10-CM | POA: Diagnosis not present

## 2021-10-22 DIAGNOSIS — I509 Heart failure, unspecified: Secondary | ICD-10-CM | POA: Diagnosis present

## 2021-10-22 DIAGNOSIS — I4891 Unspecified atrial fibrillation: Secondary | ICD-10-CM | POA: Diagnosis present

## 2021-10-22 DIAGNOSIS — J45909 Unspecified asthma, uncomplicated: Secondary | ICD-10-CM | POA: Diagnosis not present

## 2021-10-22 DIAGNOSIS — M1990 Unspecified osteoarthritis, unspecified site: Secondary | ICD-10-CM | POA: Diagnosis present

## 2021-10-22 DIAGNOSIS — K922 Gastrointestinal hemorrhage, unspecified: Secondary | ICD-10-CM

## 2021-10-22 HISTORY — PX: BIOPSY: SHX5522

## 2021-10-22 HISTORY — PX: ESOPHAGOGASTRODUODENOSCOPY (EGD) WITH PROPOFOL: SHX5813

## 2021-10-22 HISTORY — PX: POLYPECTOMY: SHX5525

## 2021-10-22 HISTORY — PX: HEMOSTASIS CLIP PLACEMENT: SHX6857

## 2021-10-22 LAB — COMPREHENSIVE METABOLIC PANEL
ALT: 17 U/L (ref 0–44)
ALT: 18 U/L (ref 0–44)
AST: 19 U/L (ref 15–41)
AST: 22 U/L (ref 15–41)
Albumin: 3 g/dL — ABNORMAL LOW (ref 3.5–5.0)
Albumin: 3.2 g/dL — ABNORMAL LOW (ref 3.5–5.0)
Alkaline Phosphatase: 48 U/L (ref 38–126)
Alkaline Phosphatase: 56 U/L (ref 38–126)
Anion gap: 5 (ref 5–15)
Anion gap: 9 (ref 5–15)
BUN: 14 mg/dL (ref 8–23)
BUN: 19 mg/dL (ref 8–23)
CO2: 21 mmol/L — ABNORMAL LOW (ref 22–32)
CO2: 22 mmol/L (ref 22–32)
Calcium: 8 mg/dL — ABNORMAL LOW (ref 8.9–10.3)
Calcium: 8 mg/dL — ABNORMAL LOW (ref 8.9–10.3)
Chloride: 110 mmol/L (ref 98–111)
Chloride: 117 mmol/L — ABNORMAL HIGH (ref 98–111)
Creatinine, Ser: 0.6 mg/dL (ref 0.44–1.00)
Creatinine, Ser: 0.74 mg/dL (ref 0.44–1.00)
GFR, Estimated: >60 mL/min (ref >60)
GFR, Estimated: >60 mL/min (ref >60)
Glucose, Bld: 109 mg/dL — ABNORMAL HIGH (ref 70–99)
Glucose, Bld: 141 mg/dL — ABNORMAL HIGH (ref 70–99)
Potassium: 3.8 mmol/L (ref 3.5–5.1)
Potassium: 4 mmol/L (ref 3.5–5.1)
Sodium: 140 mmol/L (ref 135–145)
Sodium: 144 mmol/L (ref 135–145)
Total Bilirubin: 0.1 mg/dL — ABNORMAL LOW (ref 0.3–1.2)
Total Bilirubin: 0.6 mg/dL (ref 0.3–1.2)
Total Protein: 5.4 g/dL — ABNORMAL LOW (ref 6.5–8.1)
Total Protein: 5.9 g/dL — ABNORMAL LOW (ref 6.5–8.1)

## 2021-10-22 LAB — ECHOCARDIOGRAM COMPLETE
AR max vel: 1.79 cm2
AV Area VTI: 1.86 cm2
AV Area mean vel: 1.55 cm2
AV Mean grad: 10 mmHg
AV Peak grad: 17.8 mmHg
Ao pk vel: 2.11 m/s
Area-P 1/2: 3.99 cm2
Calc EF: 73.3 %
Height: 63 in
MV VTI: 2.58 cm2
S' Lateral: 2.6 cm
Single Plane A2C EF: 63.5 %
Single Plane A4C EF: 80.6 %
Weight: 2239.87 oz

## 2021-10-22 LAB — CBC
HCT: 21.7 % — ABNORMAL LOW (ref 36.0–46.0)
HCT: 27.2 % — ABNORMAL LOW (ref 36.0–46.0)
Hemoglobin: 5.8 g/dL — CL (ref 12.0–15.0)
Hemoglobin: 8.3 g/dL — ABNORMAL LOW (ref 12.0–15.0)
MCH: 20.3 pg — ABNORMAL LOW (ref 26.0–34.0)
MCH: 23.4 pg — ABNORMAL LOW (ref 26.0–34.0)
MCHC: 26.7 g/dL — ABNORMAL LOW (ref 30.0–36.0)
MCHC: 30.5 g/dL (ref 30.0–36.0)
MCV: 75.9 fL — ABNORMAL LOW (ref 80.0–100.0)
MCV: 76.6 fL — ABNORMAL LOW (ref 80.0–100.0)
Platelets: 190 10*3/uL (ref 150–400)
Platelets: 209 10*3/uL (ref 150–400)
RBC: 2.86 MIL/uL — ABNORMAL LOW (ref 3.87–5.11)
RBC: 3.55 MIL/uL — ABNORMAL LOW (ref 3.87–5.11)
RDW: 17 % — ABNORMAL HIGH (ref 11.5–15.5)
RDW: 19.7 % — ABNORMAL HIGH (ref 11.5–15.5)
WBC: 4.5 10*3/uL (ref 4.0–10.5)
WBC: 6.1 10*3/uL (ref 4.0–10.5)
nRBC: 0.5 % — ABNORMAL HIGH (ref 0.0–0.2)
nRBC: 0.9 % — ABNORMAL HIGH (ref 0.0–0.2)

## 2021-10-22 LAB — PROTIME-INR
INR: 1.3 — ABNORMAL HIGH (ref 0.8–1.2)
Prothrombin Time: 15.9 seconds — ABNORMAL HIGH (ref 11.4–15.2)

## 2021-10-22 LAB — POC OCCULT BLOOD, ED: Fecal Occult Bld: POSITIVE — AB

## 2021-10-22 LAB — PREPARE RBC (CROSSMATCH)

## 2021-10-22 LAB — TROPONIN I (HIGH SENSITIVITY)
Troponin I (High Sensitivity): 8 ng/L (ref <18)
Troponin I (High Sensitivity): 9 ng/L (ref <18)

## 2021-10-22 LAB — BRAIN NATRIURETIC PEPTIDE: B Natriuretic Peptide: 407 pg/mL — ABNORMAL HIGH (ref 0.0–100.0)

## 2021-10-22 LAB — D-DIMER, QUANTITATIVE: D-Dimer, Quant: 0.76 ug/mL-FEU — ABNORMAL HIGH (ref 0.00–0.50)

## 2021-10-22 LAB — ABO/RH: ABO/RH(D): O POS

## 2021-10-22 LAB — MAGNESIUM: Magnesium: 1.9 mg/dL (ref 1.7–2.4)

## 2021-10-22 SURGERY — ESOPHAGOGASTRODUODENOSCOPY (EGD) WITH PROPOFOL
Anesthesia: General

## 2021-10-22 MED ORDER — PROPOFOL 10 MG/ML IV BOLUS
INTRAVENOUS | Status: DC | PRN
  Administered 2021-10-22: 80 mg via INTRAVENOUS
  Administered 2021-10-22: 40 mg via INTRAVENOUS
  Administered 2021-10-22: 60 mg via INTRAVENOUS
  Administered 2021-10-22: 40 mg via INTRAVENOUS
  Administered 2021-10-22 (×2): 30 mg via INTRAVENOUS
  Administered 2021-10-22: 40 mg via INTRAVENOUS
  Administered 2021-10-22 (×2): 30 mg via INTRAVENOUS

## 2021-10-22 MED ORDER — MORPHINE SULFATE (PF) 2 MG/ML IV SOLN: 2.0000 mg | INTRAVENOUS | Status: DC | PRN

## 2021-10-22 MED ORDER — ACETAMINOPHEN 325 MG PO TABS
650.0000 mg | ORAL_TABLET | Freq: Four times a day (QID) | ORAL | Status: DC | PRN
  Administered 2021-10-22 – 2021-10-26 (×7): 650 mg via ORAL
  Filled 2021-10-22 (×7): qty 2

## 2021-10-22 MED ORDER — ONDANSETRON HCL 4 MG PO TABS
4.0000 mg | ORAL_TABLET | Freq: Four times a day (QID) | ORAL | Status: DC | PRN
  Filled 2021-10-22: qty 1

## 2021-10-22 MED ORDER — SUCRALFATE 1 GM/10ML PO SUSP
1.0000 g | Freq: Three times a day (TID) | ORAL | Status: DC
  Administered 2021-10-22 – 2021-10-26 (×15): 1 g via ORAL
  Filled 2021-10-22 (×15): qty 10

## 2021-10-22 MED ORDER — MONTELUKAST SODIUM 10 MG PO TABS
10.0000 mg | ORAL_TABLET | Freq: Every evening | ORAL | Status: DC
  Administered 2021-10-22 – 2021-10-25 (×4): 10 mg via ORAL
  Filled 2021-10-22 (×4): qty 1

## 2021-10-22 MED ORDER — BRIMONIDINE TARTRATE 0.2 % OP SOLN
1.0000 [drp] | Freq: Three times a day (TID) | OPHTHALMIC | Status: DC
  Administered 2021-10-22 – 2021-10-26 (×11): 1 [drp] via OPHTHALMIC
  Filled 2021-10-22 (×2): qty 5

## 2021-10-22 MED ORDER — PANTOPRAZOLE SODIUM 40 MG IV SOLR
40.0000 mg | Freq: Two times a day (BID) | INTRAVENOUS | Status: DC
  Administered 2021-10-22 – 2021-10-26 (×9): 40 mg via INTRAVENOUS
  Filled 2021-10-22 (×8): qty 10

## 2021-10-22 MED ORDER — FUROSEMIDE 10 MG/ML IJ SOLN
20.0000 mg | Freq: Once | INTRAMUSCULAR | Status: AC
  Administered 2021-10-22: 20 mg via INTRAVENOUS
  Filled 2021-10-22: qty 2

## 2021-10-22 MED ORDER — OXYCODONE HCL 5 MG PO TABS
5.0000 mg | ORAL_TABLET | ORAL | Status: DC | PRN
  Filled 2021-10-22: qty 1

## 2021-10-22 MED ORDER — DILTIAZEM HCL ER COATED BEADS 180 MG PO CP24
180.0000 mg | ORAL_CAPSULE | Freq: Two times a day (BID) | ORAL | Status: DC
  Administered 2021-10-22 – 2021-10-26 (×6): 180 mg via ORAL
  Filled 2021-10-22 (×8): qty 1

## 2021-10-22 MED ORDER — SODIUM CHLORIDE 0.9 % IV SOLN
10.0000 mL/h | Freq: Once | INTRAVENOUS | Status: AC
  Administered 2021-10-22: 10 mL/h via INTRAVENOUS

## 2021-10-22 MED ORDER — LEVOTHYROXINE SODIUM 75 MCG PO TABS
75.0000 ug | ORAL_TABLET | Freq: Every day | ORAL | Status: DC
  Administered 2021-10-22 – 2021-10-26 (×5): 75 ug via ORAL
  Filled 2021-10-22 (×5): qty 1

## 2021-10-22 MED ORDER — LACTATED RINGERS IV SOLN: INTRAVENOUS | Status: DC | PRN

## 2021-10-22 MED ORDER — PHENYLEPHRINE 40 MCG/ML (10ML) SYRINGE FOR IV PUSH (FOR BLOOD PRESSURE SUPPORT)
PREFILLED_SYRINGE | INTRAVENOUS | Status: DC | PRN
  Administered 2021-10-22: 80 ug via INTRAVENOUS

## 2021-10-22 MED ORDER — LATANOPROST 0.005 % OP SOLN
1.0000 [drp] | Freq: Every day | OPHTHALMIC | Status: DC
  Administered 2021-10-22 – 2021-10-25 (×4): 1 [drp] via OPHTHALMIC
  Filled 2021-10-22: qty 2.5

## 2021-10-22 MED ORDER — SERTRALINE HCL 50 MG PO TABS: 25.0000 mg | ORAL_TABLET | Freq: Every day | ORAL | Status: DC

## 2021-10-22 MED ORDER — ONDANSETRON HCL 4 MG/2ML IJ SOLN
4.0000 mg | Freq: Four times a day (QID) | INTRAMUSCULAR | Status: DC | PRN
  Administered 2021-10-26: 4 mg via INTRAVENOUS
  Filled 2021-10-22: qty 2

## 2021-10-22 MED ORDER — LEVALBUTEROL HCL 0.63 MG/3ML IN NEBU
0.6300 mg | INHALATION_SOLUTION | Freq: Three times a day (TID) | RESPIRATORY_TRACT | Status: DC | PRN
  Administered 2021-10-25: 0.63 mg via RESPIRATORY_TRACT
  Filled 2021-10-22: qty 3

## 2021-10-22 MED ORDER — DILTIAZEM HCL ER BEADS 180 MG PO CP24
180.0000 mg | ORAL_CAPSULE | Freq: Two times a day (BID) | ORAL | Status: DC
  Filled 2021-10-22 (×4): qty 1

## 2021-10-22 MED ORDER — BUDESONIDE 0.25 MG/2ML IN SUSP
0.2500 mg | Freq: Two times a day (BID) | RESPIRATORY_TRACT | Status: DC
  Administered 2021-10-22 – 2021-10-23 (×3): 0.25 mg via RESPIRATORY_TRACT
  Filled 2021-10-22 (×2): qty 2

## 2021-10-22 MED ORDER — LORAZEPAM 0.5 MG PO TABS: 0.5000 mg | ORAL_TABLET | Freq: Four times a day (QID) | ORAL | Status: DC | PRN

## 2021-10-22 MED ORDER — ACETAMINOPHEN 650 MG RE SUPP
650.0000 mg | Freq: Four times a day (QID) | RECTAL | Status: DC | PRN
Start: 2021-10-22 — End: 2021-10-26

## 2021-10-22 MED ORDER — AMIODARONE HCL 200 MG PO TABS
200.0000 mg | ORAL_TABLET | Freq: Every day | ORAL | Status: DC
  Administered 2021-10-22 – 2021-10-26 (×3): 200 mg via ORAL
  Filled 2021-10-22 (×5): qty 1

## 2021-10-22 MED ORDER — LIDOCAINE HCL (CARDIAC) PF 100 MG/5ML IV SOSY
PREFILLED_SYRINGE | INTRAVENOUS | Status: DC | PRN
  Administered 2021-10-22: 50 mg via INTRAVENOUS

## 2021-10-22 MED ORDER — LOSARTAN POTASSIUM 50 MG PO TABS: 25.0000 mg | ORAL_TABLET | Freq: Every day | ORAL | Status: DC

## 2021-10-22 NOTE — Assessment & Plan Note (Addendum)
Albumin 3.2 ?Likely 2/2 poor PO intake ?Advance diet as tolerated ?She is tolerating diet well at this time ?

## 2021-10-22 NOTE — TOC Initial Note (Signed)
Transition of Care (TOC) - Initial/Assessment Note  ? ? ?Patient Details  ?Name: Stacey Davenport ?MRN: [id number] ?Date of Birth: 1940-12-23 ? ?Transition of Care (TOC) CM/SW Contact:    ?Carrieanne Kleen D, LCSW ?Phone Number: ?10/22/2021, 4:25 PM ? ?Clinical Narrative:                 ?Patient from home alone. Active with [facility]. Has [facility] nurse. Family has been speaking with [facility] about increasing nurse hours. Has walker and wheelchair. Has hospital bed, does not sleep in it, sleeps in recliner. Family check in on patient. Family realizes that patient has not been taking medications as prescribed and will be more active in assisting patient with medication management.  ? ?Expected Discharge Plan: [name] ?Barriers to Discharge: Continued Medical Work up ? ? ?Patient Goals and CMS Choice ?  ?  ?  ? ?Expected Discharge Plan and Services ?Expected Discharge Plan: [name] ?  ?  ?  ?  ?                ?  ?  ?  ?  ?  ?HH Arranged: RN ?  ?  ?  ?  ? ?Prior Living Arrangements/Services ?  ?  ?Patient language and need for interpreter reviewed:: Yes ?       ?Need for Family Participation in Patient Care: Yes (Comment) ?Care giver support system in place?: Yes (comment) ?Current home services: DME ?Criminal Activity/Legal Involvement Pertinent to Current Situation/Hospitalization: No - Comment as needed ? ?Activities of Daily Living ?Home Assistive Devices/Equipment: Eyeglasses, Wheelchair, Environmental consultant (specify type) ?ADL Screening (condition at time of admission) ?Patient's cognitive ability adequate to safely complete daily activities?: Yes ?Is the patient deaf or have difficulty hearing?: Yes ?Does the patient have difficulty seeing, even when wearing glasses/contacts?: Yes ?Does the patient have difficulty concentrating, remembering, or making decisions?: Yes ?Patient able to express need for assistance with ADLs?: Yes ?Does the patient have difficulty dressing or bathing?: No ?Independently  performs ADLs?: Yes (appropriate for developmental age) ?Does the patient have difficulty walking or climbing stairs?: Yes ?Weakness of Legs: Both ?Weakness of Arms/Hands: None ? ?Permission Sought/Granted ?Permission sought to share information with : Family Supports ?  ? Share Information with NAME: Shanon Brow, son ?   ?   ?   ? ?Emotional Assessment ?  ?  ?  ?Orientation: : Oriented to Self ?Alcohol / Substance Use: Not Applicable ?Psych Involvement: No (comment) ? ?Admission diagnosis:  Symptomatic anemia [D64.9] ?Patient Active Problem List  ? Diagnosis Date Noted  ? Symptomatic anemia 10/22/2021  ? Mild protein-calorie malnutrition ([place]) 10/22/2021  ? Depression 10/22/2021  ? Elevated brain natriuretic peptide (BNP) level 10/22/2021  ? GI bleed 10/22/2021  ? Anticoagulated   ? 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 Irregular heart beat 08/15/2017  ? Primary open angle glaucoma of both eyes, mild stage 04/11/2017  ? High risk medication use 09/03/2013  ? Multiple drug allergies 09/03/2013  ? 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 Hypothyroidism 04/23/2013  ? Hyperlipidemia 12/06/2012  ? CVA History 12/06/2012  ? Atrial fibrillation ([place]) 09/30/2011  ? Hypertension 08/20/2009  ? Glaucoma 04/17/2009  ? Asthma, chronic 04/17/2009  ? ESOPHAGEAL STRICTURE 04/17/2009  ? GERD 04/17/2009  ? Rheumatoid arthritis(714.0) 04/17/2009  ? ?PCP:  Janifer Adie, MD ?Pharmacy:   ?[facility] [address] [place], [place] ?[place] ?[place]  [address] ?Phone: [phone] Fax: [phone] ? ?[place], [place] ?[address] ?[address] ?[place] [address] ?Phone: [phone] Fax: [phone] ? ?[place], [place] ?[place] [address] ?Phone: [phone] Fax: [phone] ? ? ? ? ?Social Determinants of Health (SDOH) Interventions ?   ? ?Readmission Risk Interventions ?   ? View : No data to display.  ?  ?  ?  ? ? ? ?

## 2021-10-22 NOTE — Assessment & Plan Note (Addendum)
Dyspnea for 3 weeks ?Reports one possible melanotic stool ?Hgb 5.8 ?Last hgb 12.1 in 04/2021 ?FOBT + ?Continue protonix '40mg'$  IV BID ?EGD done 3/30 with findings of Multiple hemorrhagic gastric polyps. 5 oozing polyps were removed; clips placed.  Pt treated with carafate x 3 days, protonix 40 mg BID, iron supplement.  ?-Hg stable at 8 today.  GI service arranged for CBC test in 1 week and follow up with Dr. Fuller Plan.  ?-per GI team restarted xarelto today with supper on 3/31.   ? ?

## 2021-10-22 NOTE — Assessment & Plan Note (Addendum)
--  From bleeding gastric polyps that were clipped on 10/22/21 by Dr. Sydell Axon ?--continue protonix 40 mg BID, iron supplement ? ?

## 2021-10-22 NOTE — Assessment & Plan Note (Addendum)
No formal history of CHF  ?BNP 407, mild CHF on CXR ?Physical exam reveals venous stasis changes in the lower extremities BL with some blisters - no current weeping ?Echo 3/30:  LVEF 60-65% with grade 2 DD  ?--Pt has diuresed about 1L since being given lasix treatment.  ? ? ? ?

## 2021-10-22 NOTE — Assessment & Plan Note (Signed)
Continue synthroid.

## 2021-10-22 NOTE — Consult Note (Signed)
? ? ?Gastroenterology Consult  ? ?Referring Provider: No ref. provider found ?Primary Care Physician:  Janifer Adie, MD ?Primary Gastroenterologist:  Dr. Lucio Edward ? ?Patient ID: Stacey Davenport; [id number]; 03/16/1941  ? ?Admit date: 10/21/2021 ? LOS: 0 days  ? ?Date of Consultation: 10/22/2021 ? ?Reason for Consultation:  acute anemia, heme + stool. ?  ? ?History of Present Illness  ? ?Stacey Davenport is a 81 y.o. female with past medical history of asthma, A-fib, stroke, esophageal stricture status post dilation in 2010, GERD, hiatal hernia, hypertension, hypothyroidism, RA presenting to the emergency department via EMS with worsening dyspnea.  Symptoms present for 3 weeks, gradually getting worse.  Worsening DOE.  Limited response to inhalers. Seen in her PCP office Monday and told her Hgb was 7, oral iron started.  ? ?In the ED, vital signs stable.  Hemoglobin noted to be 5.8, down from 12.5 in October 2022.  MCV 75.9 down from 95.8 in October 2022.  Platelets 209,000.  BUN 19, creatinine 0.74, troponin 8-->9, D-dimer 0.76, BNP 407, INR 1.3.  Stool heme positive.  Chest x-ray with mild congestive heart failure. ? ?CT imaging in October 2022 with evidence of large hiatal hernia, no findings suggestive of cirrhosis.  Clustered mixed nodular consolidation and groundglass in the left upper lobe with adjacent tree-in-bud nodularity favoring infectious/inflammatory process.  Mildly enlarged heart. ? ?Today: Patient reports that she has had intermittent dark/black stool for a couple of months but did not realize this could have been significant.  Last episode was 2 days ago.  She denies any epistaxis, large bruising, vaginal bleeding, hematuria.  Chronically complains of intermittent lower abdominal pain and occasional nausea.  She contributes it to possibly eating something that does not agree with her.  Takes Tums for her lower abdominal pain, feels like it does help.  Denies dysuria.  A couple of months ago it  was noted that she rapidly lost about 30 pounds.  Daughter believes she was not eating adequately, family has started helping bring meals by.  She is also on a nutritional supplemental drink provided by [facility] reportedly dropped from 165 to 135 pounds.  Patient believes she is back at 157.  Weight on file this admission was 140 pounds. Occasionally takes MiraLAX, maybe once per week but generally bowel movements are regular.  She denies heartburn, dysphagia.  Patient states she is no longer on Coumadin, was switched a couple of times to newer anticoagulants.  She believes she is on Xarelto, takes once daily with last dose a.m. on March 29. ? ?History of IDA, in the setting of chronic anticoagulation, requiring GI work-up in July 2018.  Has required IV iron infusions in the past. Last infusions in 10/2020. ? ?Colonoscopy July 2018: ?-Two 6 to 7 mm polyps in the sigmoid colon and in the descending colon, removed with a cold snare. Resected and retrieved.  Tubular adenomas. ?- One non-bleeding colonic angiodysplastic lesion. ?- Moderate diverticulosis in the left colon. There was no evidence of diverticular bleeding. ?- Internal hemorrhoids. ? ?EGD January 2017: ?1.Pedunculated polyp in the cardia; polypectomy performed; Hemoclip applied to site ?2. Few sessile polyps in the gastric body; multiple sampling biopsies performed ?3. Small hiatal hernia ?4. Tortuous esophagus ?5.  Path showed hyperplastic polyps, no H. pylori. ? ?EGD December 2008: ?-Normal esophagus ?-Multiple hyperplastic polyps found in the stomach, benign gastric mucosa ?-Benign fundic gland polyps ?-Normal duodenum, biopsies negative for celiac. ? ?Medications not yet updated ?Prior to Admission medications   ?  Medication Sig Start Date End Date Taking? Authorizing Provider  ?amiodarone (PACERONE) 200 MG tablet Take 1 tablet (200 mg total) by mouth daily. 03/07/13   Minus Breeding, MD  ?brimonidine (ALPHAGAN) 0.2 % ophthalmic solution USE 1 DROP IN  [facility] EYE 3 TIMES DAILY 06/11/15   Chipper Herb, MD  ?calcium carbonate (TUMS - DOSED IN MG ELEMENTAL CALCIUM) 500 MG chewable tablet Chew 1 tablet by mouth as needed.     [provider]  ?cetirizine (ZYRTEC) 10 MG tablet Take 10 mg by mouth daily. ?Patient not taking: Reported on 05/28/2020    [provider]  ?cholecalciferol (VITAMIN D) 1000 UNITS tablet Take 1,000 Units by mouth daily.     [provider]  ?ciclesonide (ALVESCO) 160 MCG/ACT inhaler Inhale 1 puff into the lungs 2 (two) times daily.    [provider]  ?diltiazem (TIAZAC) 180 MG 24 hr capsule Take 1 capsule (180 mg total) by mouth 2 (two) times daily. Take one tablet by mouth in the evening 03/17/16   Minus Breeding, MD  ?fluticasone (FLONASE) 50 MCG/ACT nasal spray Place into both nostrils daily. ?Patient not taking: Reported on 05/28/2020    [provider]  ?furosemide (LASIX) 20 MG tablet Take 1 tablet (20 mg total) by mouth daily for 3 days. 06/11/21 06/14/21  Minus Breeding, MD  ?latanoprost (XALATAN) 0.005 % ophthalmic solution PLACE 1 DROP IN [facility] EYE AT BEDTIME 06/11/15   Chipper Herb, MD  ?levalbuterol Penne Lash) 0.63 MG/3ML nebulizer solution Take 0.63 mg by nebulization every 8 (eight) hours as needed for wheezing or shortness of breath.    [provider]  ?levothyroxine (SYNTHROID, LEVOTHROID) 75 MCG tablet Take 75 mcg by mouth daily before breakfast. Take once daily 12/06/12   Chipper Herb, MD  ?loratadine (CLARITIN) 10 MG tablet Take 10 mg by mouth daily.    [provider]  ?LORazepam (ATIVAN) 0.5 MG tablet TAKE 1 TO 2 TABLETS EVERY DAY    Chipper Herb, MD  ?losartan (COZAAR) 25 MG tablet Take 25 mg by mouth daily. Take 1/2 tablet by mouth daily    [provider]  ?montelukast (SINGULAIR) 10 MG tablet Take 1 tablet by mouth every evening.    [provider]  ?predniSONE (DELTASONE) 5 MG tablet Take 5 mg by mouth. Take 1 1/2 tablet by mouth  daily    [provider]  ?sertraline (ZOLOFT) 25 MG tablet Take 25 mg by mouth daily.    [provider]  ??  Xarelto     Chipper Herb, MD  ?Penne Lash [facility] 45 MCG/ACT inhaler USE 2 PUFFS TWICE DAILY ?Patient not taking: Reported on 05/28/2020    Chipper Herb, MD  ?potassium chloride (K-DUR,KLOR-CON) 10 MEQ tablet Take 1 tablet (10 mEq total) by mouth daily. 12/06/12 10/11/13  Chipper Herb, MD  ? ? ?Current Facility-Administered Medications  ?Medication Dose Route Frequency Provider Last Rate Last Admin  ? acetaminophen (TYLENOL) tablet 650 mg  650 mg Oral Q6H PRN Zierle-Ghosh, Asia B, DO      ? Or  ? acetaminophen (TYLENOL) suppository 650 mg  650 mg Rectal Q6H PRN Zierle-Ghosh, Asia B, DO      ? amiodarone (PACERONE) tablet 200 mg  200 mg Oral Daily Zierle-Ghosh, Asia B, DO      ? brimonidine (ALPHAGAN) 0.2 % ophthalmic solution 1 drop  1 drop Both Eyes Q8H Zierle-Ghosh, Asia B, DO      ? budesonide (PULMICORT) nebulizer  solution 0.25 mg  0.25 mg Nebulization BID Zierle-Ghosh, Asia B, DO   0.25 mg at 10/22/21 9977  ? diltiazem (TIAZAC) 24 hr capsule 180 mg  180 mg Oral BID Zierle-Ghosh, Asia B, DO      ? latanoprost (XALATAN) 0.005 % ophthalmic solution 1 drop  1 drop Both Eyes QHS Zierle-Ghosh, Asia B, DO      ? levalbuterol (XOPENEX) nebulizer solution 0.63 mg  0.63 mg Nebulization Q8H PRN Zierle-Ghosh, Asia B, DO      ? levothyroxine (SYNTHROID) tablet 75 mcg  75 mcg Oral QAC breakfast Zierle-Ghosh, Asia B, DO      ? LORazepam (ATIVAN) tablet 0.5 mg  0.5 mg Oral Q6H PRN Zierle-Ghosh, Asia B, DO      ? losartan (COZAAR) tablet 25 mg  25 mg Oral Daily Zierle-Ghosh, Asia B, DO      ? montelukast (SINGULAIR) tablet 10 mg  10 mg Oral QPM Zierle-Ghosh, Asia B, DO      ? morphine (PF) 2 MG/ML injection 2 mg  2 mg Intravenous Q2H PRN Zierle-Ghosh, Asia B, DO      ? ondansetron (ZOFRAN) tablet 4 mg  4 mg Oral Q6H PRN Zierle-Ghosh, Asia B, DO      ? Or  ? ondansetron (ZOFRAN) injection 4 mg  4 mg  Intravenous Q6H PRN Zierle-Ghosh, Asia B, DO      ? oxyCODONE (Oxy IR/ROXICODONE) immediate release tablet 5 mg  5 mg Oral Q4H PRN Zierle-Ghosh, Asia B, DO      ? pantoprazole (PROTONIX) injection 40 mg  40

## 2021-10-22 NOTE — Op Note (Signed)
[facility] ?Patient Name: Stacey Davenport ?Procedure Date: 10/22/2021 2:59 PM ?MRN: [id number] ?Date of Birth: 09/25/1940 ?Attending MD: Norvel Richards , MD ?CSN: [id number] ?Age: 81 ?Admit Type: Inpatient ?Procedure:                Upper GI endoscopy ?Indications:              Upper GI bleed/melena/decline in hemoglobin; last  ?                          Xarelto yesterday morning ?Providers:                Norvel Richards, MD, Gwynneth Albright RN,  ?                          RN, Randa Spike, Technician ?Referring MD:              ?Medicines:                Propofol per Anesthesia ?Complications:            No immediate complications. ?Estimated Blood Loss:     Estimated blood loss was minimal. ?Procedure:                Pre-Anesthesia Assessment: ?                          - Prior to the procedure, a History and Physical  ?                          was performed, and patient medications and  ?                          allergies were reviewed. The patient's tolerance of  ?                          previous anesthesia was also reviewed. The risks  ?                          and benefits of the procedure and the sedation  ?                          options and risks were discussed with the patient.  ?                          All questions were answered, and informed consent  ?                          was obtained. Prior Anticoagulants: The patient has  ?                          taken no previous anticoagulant or antiplatelet  ?                          agents. ASA Grade Assessment: III - A patient with  ?  severe systemic disease. After reviewing the risks  ?                          and benefits, the patient was deemed in  ?                          satisfactory condition to undergo the procedure. ?                          After obtaining informed consent, the endoscope was  ?                          passed under direct vision. Throughout the  ?                           procedure, the patient's blood pressure, pulse, and  ?                          oxygen saturations were monitored continuously. The  ?                          GIF-H190 ([id number]) scope was introduced through the  ?                          mouth, and advanced to the second part of duodenum.  ?                          The upper GI endoscopy was accomplished without  ?                          difficulty. The patient tolerated the procedure  ?                          well. ?Scope In: 3:21:35 PM ?Scope Out: 3:58:14 PM ?Total Procedure Duration: 0 hours 36 minutes 39 seconds  ?Findings: ?     The examined esophagus was normal. ?     A large hiatal hernia was present. Multiple pedunculated hemorrhagic  ?     polyps throughout the hernia sac and gastric body/fundus. (4) 1 cm. They  ?     were hemorrhagic. Active oozing noted. A single 5 mm polyp straddling  ?     the diaphragmatic hiatus was particularly inflamed. Please see photos. ?     The duodenal bulb and second portion of the duodenum were normal.  ?     Multiple hot snare polypectomies performed. (4) hemostasis clips placed  ?     on (4) polypectomy sites. Biopsies of the antrum and gastric body taken  ?     for H. pylori testing. ?Impression:               - Normal esophagus. ?                          - Large hiatal hernia. Multiple hemorrhagic gastric  ?  polyps. 5 oozing polyps were removed; clips placed.  ?                          Multiple, smaller innocent appearing polyps remain. ?                          - Normal duodenal bulb and second portion of the  ?                          duodenum. ?                          -Patient has been bleeding from her stomach in the  ?                          setting of anticoagulation. ?Moderate Sedation: ?     Moderate (conscious) sedation was personally administered by an  ?     anesthesia professional. The following parameters were monitored: oxygen  ?     saturation, heart rate, blood  pressure, respiratory rate, EKG, adequacy  ?     of pulmonary ventilation, and response to care. ?Recommendation:           - Clear liquid diet. Continue twice daily PPI.  ?                          Carafate suspension 1 g 4 times daily x3 days.  ?                          Follow-up on pathology. No MRI until clips gone.  ?                          Resume Xarelto tomorrow. My findings and  ?                          recommendations were discussed with the patient's  ?                          son, Kristopher Glee at [phone] ?Procedure Code(s):        --- Professional --- ?                          [phone], Esophagogastroduodenoscopy, flexible,  ?                          transoral; diagnostic, including collection of  ?                          specimen(s) by brushing or washing, when performed  ?                          (separate procedure) ?Diagnosis Code(s):        --- Professional --- ?                          K44.9, Diaphragmatic hernia without obstruction or  ?  gangrene ?CPT copyright 2019 [employer] Association. All rights reserved. ?The codes documented in this report are preliminary and upon coder review may  ?be revised to meet current compliance requirements. ?Cristopher Estimable. Marchel Foote, MD ?Norvel Richards, MD ?10/22/2021 4:18:46 PM ?This report has been signed electronically. ?Number of Addenda: 0 ?

## 2021-10-22 NOTE — Assessment & Plan Note (Addendum)
PPI's flagged as allergies, but patient does not remember the reaction ?Given GI bleed patient was started on IV protonix and tolerated well, DC on protonix 40 mg po BID ?GI service arranged for follow up with Dr. Fuller Plan her outpatient GI.  ?

## 2021-10-22 NOTE — H&P (Signed)
?History and Physical  ? ? ?Patient: Stacey Davenport IWP:[id number] DOB: [birth year] ?DOA: 10/21/2021 ?DOS: the patient was seen and examined on 10/22/2021 ?PCP: Janifer Adie, MD  ?Patient coming from: Home ? ?Chief Complaint:  ?Chief Complaint  ?Patient presents with  ? Shortness of Breath  ? ?HPI: Stacey Davenport is a 81 y.o. female with medical history significant of with history of asthma, atrial fibrillation, CVA, esophageal stricture, GERD, hiatal hernia, hyperlipidemia, hypertension, hypothyroidism, rheumatoid arthritis, and more presents ED with a chief complaint of dyspnea.  Patient reports that she had dyspnea for 3 weeks.  Its been getting worse since it started, very gradually.  She noted some wheezing in the beginning.  She has been using her inhalers and nebulizer more often but only gets temporary relief with these treatments.  She did have 1 episode of chest pain 2 days ago in her left lower chest.  It was there briefly and felt like a pressure.  Patient reports that it has not recurred.  Her dyspnea is worse with exertion.  She does not have orthopnea.  Patient was found to have a low hemoglobin in the ED.  She denies any overt bleeding.  She thinks she may have had 1 melanotic stool.  She denies epistaxis, large bruises, vaginal bleeding, hematuria.  Patient reports some occasional nausea and abdominal pain.  When the abdominal pain is present in the left lower quadrant and its mild.  She takes Tums and that relieves the pain.  It usually occurs just after mealtime.  Patient reports that she drinks a lot of water at home.  She does this because without it she has some dysuria.  Patient has no other complaints at this time. ? ?Patient does not smoke, does not drink alcohol, does not use illicit drugs.  She is not vaccinated for COVID.  She is DNI stating and never want to be put on the machine. ?Review of Systems: As mentioned in the history of present illness. All other systems reviewed and are  negative. ?Past Medical History:  ?Diagnosis Date  ? Anxiety   ? Asthma   ? Atrial fibrillation ([place])   ? CVA (cerebral infarction)   ? a. 09/2011 MRI tiny bilat centrum semiovale acute non hemorrhagic infarcts  ? Esophageal stricture   ? Gastric polyp   ? Hyperplastic  ? Gastritis   ? GERD (gastroesophageal reflux disease)   ? Glaucoma   ? Hiatal hernia   ? Hyperlipidemia   ? Hypertension   ? Hypothyroidism   ? Osteoarthritis   ? Pneumonia   ? Rheumatoid arthritis(714.0)   ? Thyroid disease   ? ?Past Surgical History:  ?Procedure Laterality Date  ? APPENDECTOMY    ? BLEPHAROPLASTY Right   ? CATARACT EXTRACTION Bilateral   ? CHOLECYSTECTOMY    ? TUBAL LIGATION    ? ?Social History:  reports that she has never smoked. She has never used smokeless tobacco. She reports that she does not drink alcohol and does not use drugs. ? ?Allergies  ?Allergen Reactions  ? Sulfamethoxazole Nausea And Vomiting  ?  Bladder infection ?  ? Aciphex [Rabeprazole Sodium]   ? Amoxicillin   ? Biaxin [Clarithromycin] Hives and Itching  ? Cephalexin   ? Esomeprazole Magnesium   ?  Takes Dexilant at home  ? Hydrocodone   ? Hydrocodone-Acetaminophen   ? Ketek [Telithromycin]   ? Latex   ? Levaquin [Levofloxacin In D5w]   ? Lipitor [Atorvastatin]   ? Macrobid [  Nitrofurantoin Macrocrystal]   ? Moxifloxacin   ? Nexium [Esomeprazole Magnesium]   ? Rabeprazole Sodium   ?  Takes Dexilant at home  ? Sulfonamide Derivatives   ? Vesicare [Solifenacin]   ? Zithromax [Azithromycin]   ? ? ?Family History  ?Problem Relation Age of Onset  ? Diabetes Mother   ? Cancer Father   ?     lymph nodes???  ? Diabetes Sister   ? Heart disease Sister   ? Colon cancer Neg Hx   ? Esophageal cancer Neg Hx   ? ? ?Prior to Admission medications   ?Medication Sig Start Date End Date Taking? Authorizing Provider  ?amiodarone (PACERONE) 200 MG tablet Take 1 tablet (200 mg total) by mouth daily. 03/07/13   Minus Breeding, MD  ?brimonidine (ALPHAGAN) 0.2 % ophthalmic solution USE  1 DROP IN [facility] EYE 3 TIMES DAILY 06/11/15   Chipper Herb, MD  ?calcium carbonate (TUMS - DOSED IN MG ELEMENTAL CALCIUM) 500 MG chewable tablet Chew 1 tablet by mouth as needed.     [provider]  ?cetirizine (ZYRTEC) 10 MG tablet Take 10 mg by mouth daily. ?Patient not taking: Reported on 05/28/2020    [provider]  ?cholecalciferol (VITAMIN D) 1000 UNITS tablet Take 1,000 Units by mouth daily.     [provider]  ?ciclesonide (ALVESCO) 160 MCG/ACT inhaler Inhale 1 puff into the lungs 2 (two) times daily.    [provider]  ?diltiazem (TIAZAC) 180 MG 24 hr capsule Take 1 capsule (180 mg total) by mouth 2 (two) times daily. Take one tablet by mouth in the evening 03/17/16   Minus Breeding, MD  ?fluticasone (FLONASE) 50 MCG/ACT nasal spray Place into both nostrils daily. ?Patient not taking: Reported on 05/28/2020    [provider]  ?furosemide (LASIX) 20 MG tablet Take 1 tablet (20 mg total) by mouth daily for 3 days. 06/11/21 06/14/21  Minus Breeding, MD  ?latanoprost (XALATAN) 0.005 % ophthalmic solution PLACE 1 DROP IN [facility] EYE AT BEDTIME 06/11/15   Chipper Herb, MD  ?levalbuterol Penne Lash) 0.63 MG/3ML nebulizer solution Take 0.63 mg by nebulization every 8 (eight) hours as needed for wheezing or shortness of breath.    [provider]  ?levothyroxine (SYNTHROID, LEVOTHROID) 75 MCG tablet Take 75 mcg by mouth daily before breakfast. Take once daily 12/06/12   Chipper Herb, MD  ?loratadine (CLARITIN) 10 MG tablet Take 10 mg by mouth daily.    [provider]  ?LORazepam (ATIVAN) 0.5 MG tablet TAKE 1 TO 2 TABLETS EVERY DAY    Chipper Herb, MD  ?losartan (COZAAR) 25 MG tablet Take 25 mg by mouth daily. Take 1/2 tablet by mouth daily    [provider]  ?montelukast (SINGULAIR) 10 MG tablet Take 1 tablet by mouth every evening.    [provider]  ?predniSONE (DELTASONE) 5 MG tablet Take 5 mg by mouth. Take 1 1/2 tablet  by mouth daily    [provider]  ?sertraline (ZOLOFT) 25 MG tablet Take 25 mg by mouth daily.    [provider]  ?warfarin (COUMADIN) 4 MG tablet Take 2 mg by mouth as directed. Take 2 mg Sunday and Thursday. Take 1.5  mg monday, Tuesday, Wednesday, Friday and Saturday 12/06/12 05/15/19  Chipper Herb, MD  ?Penne Lash HFA 45 MCG/ACT inhaler USE 2 PUFFS TWICE DAILY ?Patient not taking: Reported on 05/28/2020    Chipper Herb, MD  ?potassium chloride (K-DUR,KLOR-CON) 10 MEQ  tablet Take 1 tablet (10 mEq total) by mouth daily. 12/06/12 10/11/13  Chipper Herb, MD  ? ? ?Physical Exam: ?Vitals:  ? 10/22/21 0003 10/22/21 0151 10/22/21 0244 10/22/21 0315  ?BP:  (!) 144/83 (!) 168/61 (!) 142/58  ?Pulse:  70 71 70  ?Resp:  '18 20 20  '$ ?Temp:   98.4 ?F (36.9 ?C) 98 ?F (36.7 ?C)  ?TempSrc:   Oral Oral  ?SpO2: 97% 95% 97% 97%  ?Weight:      ?Height:      ? ?1.  General: ?Patient lying supine in bed,  no acute distress ?  ?2. Psychiatric: ?Alert and oriented x 3, mood and behavior normal for situation, pleasant and cooperative with exam ?  ?3. Neurologic: ?Speech and language are normal, face is symmetric, moves all 4 extremities voluntarily, at baseline without acute deficits on limited exam ?  ?4. HEENMT:  ?Head is atraumatic, normocephalic, pupils reactive to light, neck is supple, trachea is midline, mucous membranes are moist ?  ?5. Respiratory : ?Lungs are clear to auscultation bilaterally without wheezing, rhonchi, rales, no cyanosis, no increase in work of breathing or accessory muscle use ?  ?6. Cardiovascular : ?Heart rate normal, rhythm is regular, murmur present, rubs or gallops, venous stasis changes in the bilateral lower extremities with some mild blistering, peripheral pulses palpated ?  ?7. Gastrointestinal:  ?Abdomen is soft, nondistended, nontender to palpation bowel sounds active, no masses or organomegaly palpated ?  ?8. Skin:  ?Skin is warm, dry and intact without rashes, acute lesions,  venous stasis changes in the bilateral lower extremities with a grayish skin tone and blisters ?  ?9.Musculoskeletal:  ?No acute deformities or trauma, no asymmetry in tone, peripheral pulses palpated, no t

## 2021-10-22 NOTE — Progress Notes (Signed)
*  PRELIMINARY RESULTS* ?Echocardiogram ?2D Echocardiogram has been performed. ? ?Stacey Davenport ?10/22/2021, 12:34 PM ?

## 2021-10-22 NOTE — Assessment & Plan Note (Addendum)
--  resume daily prednisone 10 mg / protonix for GI protection ?Continue home bronchodilators ?

## 2021-10-22 NOTE — Anesthesia Preprocedure Evaluation (Addendum)
Anesthesia Evaluation  ?Patient identified by MRN, date of birth, ID band ?Patient confused ? ? ? ?Reviewed: ?Allergy & Precautions, H&P , NPO status , Patient's Chart, lab work & pertinent test results, reviewed documented beta blocker date and time , Unable to perform ROS - Chart review only ? ?Airway ?Mallampati: II ? ?TM Distance: >3 FB ?Neck ROM: full ? ? ? Dental ?no notable dental hx. ?(+) Chipped ?  ?Pulmonary ?neg pulmonary ROS, asthma ,  ?  ?Pulmonary exam normal ?breath sounds clear to auscultation ? ? ? ? ? ? Cardiovascular ?Exercise Tolerance: Good ?hypertension, negative cardio ROS ? ? ?Rhythm:regular Rate:Normal ? ? ?  ?Neuro/Psych ?PSYCHIATRIC DISORDERS Anxiety Depression  Neuromuscular disease negative neurological ROS ? negative psych ROS  ? GI/Hepatic ?negative GI ROS, Neg liver ROS, hiatal hernia, GERD  ,  ?Endo/Other  ?negative endocrine ROSHypothyroidism  ? Renal/GU ?negative Renal ROS  ?negative genitourinary ?  ?Musculoskeletal ? ?(+) Arthritis , Rheumatoid disorders,   ? Abdominal ?  ?Peds ? Hematology ?negative hematology ROS ?(+) Blood dyscrasia, anemia ,   ?Anesthesia Other Findings ? ? Reproductive/Obstetrics ?negative OB ROS ? ?  ? ? ? ? ? ? ? ? ? ? ? ? ? ?  ?  ? ? ? ? ? ? ? ?Anesthesia Physical ?Anesthesia Plan ? ?ASA: 3 and emergent ? ?Anesthesia Plan: General  ? ?Post-op Pain Management:   ? ?Induction:  ? ?PONV Risk Score and Plan: Propofol infusion ? ?Airway Management Planned:  ? ?Additional Equipment:  ? ?Intra-op Plan:  ? ?Post-operative Plan:  ? ?Informed Consent: I have reviewed the patients History and Physical, chart, labs and discussed the procedure including the risks, benefits and alternatives for the proposed anesthesia with the patient or authorized representative who has indicated his/her understanding and acceptance.  ? ? ? ?Dental Advisory Given ? ?Plan Discussed with: CRNA ? ?Anesthesia Plan Comments:   ? ? ? ? ? ? ?Anesthesia Quick  Evaluation ? ?

## 2021-10-22 NOTE — Anesthesia Procedure Notes (Signed)
Date/Time: 10/22/2021 3:24 PM ?Performed by: Orlie Dakin, CRNA ?Pre-anesthesia Checklist: Patient identified, Emergency Drugs available, Suction available and Patient being monitored ?Patient Re-evaluated:Patient Re-evaluated prior to induction ?Oxygen Delivery Method: Nasal cannula ?Induction Type: IV induction ?Placement Confirmation: positive ETCO2 ? ? ? ? ?

## 2021-10-22 NOTE — Assessment & Plan Note (Signed)
Continue losartan and diltiazem ?

## 2021-10-22 NOTE — Transfer of Care (Signed)
Immediate Anesthesia Transfer of Care Note ? ?Patient: Stacey Davenport ? ?Procedure(s) Performed: ESOPHAGOGASTRODUODENOSCOPY (EGD) WITH PROPOFOL ?POLYPECTOMY ?BIOPSY ?HEMOSTASIS CLIP PLACEMENT ? ?Patient Location: PACU ? ?Anesthesia Type:General ? ?Level of Consciousness: awake ? ?Airway & Oxygen Therapy: Patient Spontanous Breathing and Patient connected to nasal cannula oxygen ? ?Post-op Assessment: Report given to RN and Post -op Vital signs reviewed and stable ? ?Post vital signs: Reviewed and stable ? ?Last Vitals:  ?Vitals Value Taken Time  ?BP    ?Temp    ?Pulse 63 10/22/21 1601  ?Resp 23 10/22/21 1601  ?SpO2 98 % 10/22/21 1601  ?Vitals shown include unvalidated device data. ? ?Last Pain:  ?Vitals:  ? 10/22/21 1316  ?TempSrc: Oral  ?PainSc: 4   ?   ? ?Patients Stated Pain Goal: 5 (10/22/21 1316) ? ?Complications: No notable events documented. ?

## 2021-10-22 NOTE — Progress Notes (Signed)
ASSUMPTION OF CARE NOTE  ? ?10/22/2021 ?1:09 PM ? ?Stacey Davenport was seen and examined.  The H&P by the admitting provider, orders, imaging was reviewed.  Please see new orders.  Will continue to follow.  ? ? 81 y.o. female with medical history significant of with history of asthma, atrial fibrillation, CVA, esophageal stricture, GERD, hiatal hernia, hyperlipidemia, hypertension, hypothyroidism, rheumatoid arthritis, and more presents ED with a chief complaint of dyspnea.  Patient reports that she had dyspnea for 3 weeks.  Its been getting worse since it started, very gradually.  She noted some wheezing in the beginning.  She has been using her inhalers and nebulizer more often but only gets temporary relief with these treatments.  She did have 1 episode of chest pain 2 days ago in her left lower chest.  It was there briefly and felt like a pressure.  Patient reports that it has not recurred.  Her dyspnea is worse with exertion.  She does not have orthopnea.  Patient was found to have a low hemoglobin in the ED.  She denies any overt bleeding.  She thinks she may have had 1 melanotic stool.  She denies epistaxis, large bruises, vaginal bleeding, hematuria.  Patient reports some occasional nausea and abdominal pain.  When the abdominal pain is present in the left lower quadrant and its mild.  She takes Tums and that relieves the pain.  It usually occurs just after mealtime.  Patient reports that she drinks a lot of water at home.  She does this because without it she has some dysuria.  Patient has no other complaints at this time. ?  ?Patient does not smoke, does not drink alcohol, does not use illicit drugs.  She is not vaccinated for COVID.  She is DNI stating and never want to be put on the machine. ? ?10/22/2021:  Pt going for EGD today.   ? ?Vitals:  ? 10/22/21 0957 10/22/21 1223  ?BP: (!) 157/60 (!) 147/65  ?Pulse: 70 69  ?Resp: 18 18  ?Temp: 97.7 ?F (36.5 ?C) 97.9 ?F (36.6 ?C)  ?SpO2: 98% 96%  ? ? ?Results for  orders placed or performed during the hospital encounter of 10/21/21  ?CBC  ?Result Value Ref Range  ? WBC 6.1 4.0 - 10.5 K/uL  ? RBC 2.86 (L) 3.87 - 5.11 MIL/uL  ? Hemoglobin 5.8 (LL) 12.0 - 15.0 g/dL  ? HCT 21.7 (L) 36.0 - 46.0 %  ? MCV 75.9 (L) 80.0 - 100.0 fL  ? MCH 20.3 (L) 26.0 - 34.0 pg  ? MCHC 26.7 (L) 30.0 - 36.0 g/dL  ? RDW 17.0 (H) 11.5 - 15.5 %  ? Platelets 209 150 - 400 K/uL  ? nRBC 0.5 (H) 0.0 - 0.2 %  ?Comprehensive metabolic panel  ?Result Value Ref Range  ? Sodium 144 135 - 145 mmol/L  ? Potassium 4.0 3.5 - 5.1 mmol/L  ? Chloride 117 (H) 98 - 111 mmol/L  ? CO2 22 22 - 32 mmol/L  ? Glucose, Bld 109 (H) 70 - 99 mg/dL  ? BUN 19 8 - 23 mg/dL  ? Creatinine, Ser 0.74 0.44 - 1.00 mg/dL  ? Calcium 8.0 (L) 8.9 - 10.3 mg/dL  ? Total Protein 5.9 (L) 6.5 - 8.1 g/dL  ? Albumin 3.2 (L) 3.5 - 5.0 g/dL  ? AST 22 15 - 41 U/L  ? ALT 18 0 - 44 U/L  ? Alkaline Phosphatase 56 38 - 126 U/L  ? Total Bilirubin 0.1 (  L) 0.3 - 1.2 mg/dL  ? GFR, Estimated >60 >60 mL/min  ? Anion gap 5 5 - 15  ?D-dimer, quantitative  ?Result Value Ref Range  ? D-Dimer, Quant 0.76 (H) 0.00 - 0.50 ug/mL-FEU  ?Brain natriuretic peptide  ?Result Value Ref Range  ? B Natriuretic Peptide 407.0 (H) 0.0 - 100.0 pg/mL  ?Protime-INR  ?Result Value Ref Range  ? Prothrombin Time 15.9 (H) 11.4 - 15.2 seconds  ? INR 1.3 (H) 0.8 - 1.2  ?CBC  ?Result Value Ref Range  ? WBC 4.5 4.0 - 10.5 K/uL  ? RBC 3.55 (L) 3.87 - 5.11 MIL/uL  ? Hemoglobin 8.3 (L) 12.0 - 15.0 g/dL  ? HCT 27.2 (L) 36.0 - 46.0 %  ? MCV 76.6 (L) 80.0 - 100.0 fL  ? MCH 23.4 (L) 26.0 - 34.0 pg  ? MCHC 30.5 30.0 - 36.0 g/dL  ? RDW 19.7 (H) 11.5 - 15.5 %  ? Platelets 190 150 - 400 K/uL  ? nRBC 0.9 (H) 0.0 - 0.2 %  ?Comprehensive metabolic panel  ?Result Value Ref Range  ? Sodium 140 135 - 145 mmol/L  ? Potassium 3.8 3.5 - 5.1 mmol/L  ? Chloride 110 98 - 111 mmol/L  ? CO2 21 (L) 22 - 32 mmol/L  ? Glucose, Bld 141 (H) 70 - 99 mg/dL  ? BUN 14 8 - 23 mg/dL  ? Creatinine, Ser 0.60 0.44 - 1.00 mg/dL  ?  Calcium 8.0 (L) 8.9 - 10.3 mg/dL  ? Total Protein 5.4 (L) 6.5 - 8.1 g/dL  ? Albumin 3.0 (L) 3.5 - 5.0 g/dL  ? AST 19 15 - 41 U/L  ? ALT 17 0 - 44 U/L  ? Alkaline Phosphatase 48 38 - 126 U/L  ? Total Bilirubin 0.6 0.3 - 1.2 mg/dL  ? GFR, Estimated >60 >60 mL/min  ? Anion gap 9 5 - 15  ?Magnesium  ?Result Value Ref Range  ? Magnesium 1.9 1.7 - 2.4 mg/dL  ?POC occult blood, ED  ?Result Value Ref Range  ? Fecal Occult Bld POSITIVE (A) NEGATIVE  ?ECHOCARDIOGRAM COMPLETE  ?Result Value Ref Range  ? Weight 2,239.87 oz  ? Height 63 in  ? BP 157/60 mmHg  ?ABO/Rh  ?Result Value Ref Range  ? ABO/RH(D)    ?  O POS ?Performed at [facility], [address] [place], [place] [address] ?  ?Type and screen [facility]  ?Result Value Ref Range  ? ABO/RH(D) O POS   ? Antibody Screen NEG   ? Sample Expiration 10/25/2021,2359   ? Unit Number [id number]   ? Blood Component Type RBC LR PHER1   ? Unit division 00   ? Status of Unit ISSUED   ? Transfusion Status OK TO TRANSFUSE   ? Crossmatch Result    ?  Compatible ?Performed at [facility] [address] [place], [place] [address] ?  ? Unit Number [id number]   ? Blood Component Type RBC LR PHER1   ? Unit division 00   ? Status of Unit ISSUED   ? Transfusion Status OK TO TRANSFUSE   ? Crossmatch Result Compatible   ?Prepare RBC (crossmatch)  ?Result Value Ref Range  ? Order Confirmation    ?  ORDER PROCESSED BY BLOOD BANK ?Performed at [facility], [address] [place], [place] [address] ?  ?BPAM RBC  ?Result Value Ref Range  ? ISSUE DATE / TIME [id number]   ? Blood Product Unit Number [id number]   ?  PRODUCT CODE [id number]   ? Unit Type and Rh 5100   ? Blood Product Expiration Date [id number]   ? ISSUE DATE / TIME [id number]   ? Blood Product Unit Number [id number]   ? PRODUCT CODE [id number]   ? Unit Type and Rh 5100   ? Blood Product Expiration Date [id number]   ?Troponin I (High Sensitivity)  ?Result Value Ref Range  ? Troponin I (High Sensitivity) 8 <18  ng/L  ?Troponin I (High Sensitivity)  ?Result Value Ref Range  ? Troponin I (High Sensitivity) 9 <18 ng/L  ? ?C. Wynetta Emery, MD ?[facility] ? ? 10/21/2021 11:15 PM ?How to contact the [facility] Attending or Consulting provider [place] or covering provider during after hours [place], for this patient?  ?Check the care team in [facility] and look for a) attending/consulting [facility] provider listed and b) the [facility] team listed ?Log into [email or web address] and use [facility]'s universal password to access. If you do not have the password, please contact the hospital operator. ?Locate the [facility] provider you are looking for under [facility] and page to a number that you can be directly reached. ?If you still have difficulty reaching the provider, please page the [facility] (Director on Call) for the Hospitalists listed on amion for assistance. ? ?

## 2021-10-22 NOTE — Assessment & Plan Note (Signed)
-

## 2021-10-22 NOTE — ED Provider Notes (Signed)
?[facility] DEPT ?[facility] Emergency Department ?Provider Note ?MRN:  [id number]  ?Arrival date & time: 10/22/21    ? ?Chief Complaint   ?Shortness of Breath ?  ?History of Present Illness   ?Stacey Davenport is a 81 y.o. year-old female with a history of A-fib, asthma presenting to the ED with chief complaint of shortness of breath. ? ?Shortness of breath worsening over the past 2 or 3 weeks.  Worse with ambulating.  Some chest pain described as a tightness as well.  Denies fever, no cough, no abdominal pain, no other complaints ? ?Review of Systems  ?A thorough review of systems was obtained and all systems are negative except as noted in the HPI and PMH.  ? ?Patient's Health History   ? ?Past Medical History:  ?Diagnosis Date  ? Anxiety   ? Asthma   ? Atrial fibrillation ([place])   ? CVA (cerebral infarction)   ? a. 09/2011 MRI tiny bilat centrum semiovale acute non hemorrhagic infarcts  ? Esophageal stricture   ? Gastric polyp   ? Hyperplastic  ? Gastritis   ? GERD (gastroesophageal reflux disease)   ? Glaucoma   ? Hiatal hernia   ? Hyperlipidemia   ? Hypertension   ? Hypothyroidism   ? Osteoarthritis   ? Pneumonia   ? Rheumatoid arthritis(714.0)   ? Thyroid disease   ?  ?Past Surgical History:  ?Procedure Laterality Date  ? APPENDECTOMY    ? BLEPHAROPLASTY Right   ? CATARACT EXTRACTION Bilateral   ? CHOLECYSTECTOMY    ? TUBAL LIGATION    ?  ?Family History  ?Problem Relation Age of Onset  ? Diabetes Mother   ? Cancer Father   ?     lymph nodes???  ? Diabetes Sister   ? Heart disease Sister   ? Colon cancer Neg Hx   ? Esophageal cancer Neg Hx   ?  ?Social History  ? ?Socioeconomic History  ? Marital status: Divorced  ?  Spouse name: Not on file  ? Number of children: Not on file  ? Years of education: Not on file  ? Highest education level: Not on file  ?Occupational History  ? Not on file  ?Tobacco Use  ? Smoking status: Never  ? Smokeless tobacco: Never  ?Vaping Use  ? Vaping Use: Never used  ?Substance  and Sexual Activity  ? Alcohol use: No  ?  Alcohol/week: 0.0 standard drinks  ? Drug use: No  ? Sexual activity: Never  ?  Birth control/protection: Post-menopausal  ?Other Topics Concern  ? Not on file  ?Social History Narrative  ? Retired.   ? ?Social Determinants of Health  ? ?Financial Resource Strain: Not on file  ?Food Insecurity: Not on file  ?Transportation Needs: Not on file  ?Physical Activity: Not on file  ?Stress: Not on file  ?Social Connections: Not on file  ?Intimate Partner Violence: Not on file  ?  ? ?Physical Exam  ? ?Vitals:  ? 10/22/21 0630 10/22/21 0649  ?BP: (!) 152/64 (!) 144/65  ?Pulse: 71 71  ?Resp: 20 (!) 21  ?Temp: 98.1 ?F (36.7 ?C) 99 ?F (37.2 ?C)  ?SpO2: 98% 97%  ?  ?CONSTITUTIONAL: Well-appearing, NAD ?NEURO/PSYCH:  Alert and oriented x 3, no focal deficits ?EYES:  eyes equal and reactive ?ENT/NECK:  no LAD, no JVD ?CARDIO: Regular rate, well-perfused, normal S1 and S2 ?PULM:  CTAB no wheezing or rhonchi ?GI/GU:  non-distended, non-tender ?MSK/SPINE:  No gross deformities, no edema ?  SKIN:  no rash, atraumatic ? ? ?*Additional and/or pertinent findings included in MDM below ? ?Diagnostic and Interventional Summary  ? ? EKG Interpretation ? ?Date/Time:  Wednesday October 21 2021 23:22:26 EDT ?Ventricular Rate:  68 ?PR Interval:  171 ?QRS Duration: 99 ?QT Interval:  424 ?QTC Calculation: 451 ?R Axis:   129 ?Text Interpretation: Sinus rhythm Atrial premature complex Right axis deviation Low voltage, extremity leads Probable anterolateral infarct, old Confirmed by Gerlene Fee [phone]) on 10/21/2021 11:36:12 PM ?  ? ?  ? ?Labs Reviewed  ?CBC - Abnormal; Notable for the following components:  ?    Result Value  ? RBC 2.86 (*)   ? Hemoglobin 5.8 (*)   ? HCT 21.7 (*)   ? MCV 75.9 (*)   ? MCH 20.3 (*)   ? MCHC 26.7 (*)   ? RDW 17.0 (*)   ? nRBC 0.5 (*)   ? All other components within normal limits  ?COMPREHENSIVE METABOLIC PANEL - Abnormal; Notable for the following components:  ? Chloride 117 (*)    ? Glucose, Bld 109 (*)   ? Calcium 8.0 (*)   ? Total Protein 5.9 (*)   ? Albumin 3.2 (*)   ? Total Bilirubin 0.1 (*)   ? All other components within normal limits  ?D-DIMER, QUANTITATIVE - Abnormal; Notable for the following components:  ? D-Dimer, Quant 0.76 (*)   ? All other components within normal limits  ?BRAIN NATRIURETIC PEPTIDE - Abnormal; Notable for the following components:  ? B Natriuretic Peptide 407.0 (*)   ? All other components within normal limits  ?PROTIME-INR - Abnormal; Notable for the following components:  ? Prothrombin Time 15.9 (*)   ? INR 1.3 (*)   ? All other components within normal limits  ?POC OCCULT BLOOD, ED - Abnormal; Notable for the following components:  ? Fecal Occult Bld POSITIVE (*)   ? All other components within normal limits  ?CBC  ?CBC  ?CBC  ?COMPREHENSIVE METABOLIC PANEL  ?MAGNESIUM  ?ABO/RH  ?TYPE AND SCREEN  ?PREPARE RBC (CROSSMATCH)  ?TROPONIN I (HIGH SENSITIVITY)  ?TROPONIN I (HIGH SENSITIVITY)  ?  ?DG Chest Port 1 View  ?Final Result  ?  ?  ?Medications  ?pantoprazole (PROTONIX) injection 40 mg (has no administration in time range)  ?amiodarone (PACERONE) tablet 200 mg (has no administration in time range)  ?diltiazem (TIAZAC) 24 hr capsule 180 mg (has no administration in time range)  ?losartan (COZAAR) tablet 25 mg (has no administration in time range)  ?LORazepam (ATIVAN) tablet 0.5 mg (has no administration in time range)  ?sertraline (ZOLOFT) tablet 25 mg (has no administration in time range)  ?levothyroxine (SYNTHROID) tablet 75 mcg (has no administration in time range)  ?budesonide (PULMICORT) nebulizer solution 0.25 mg (has no administration in time range)  ?levalbuterol (XOPENEX) nebulizer solution 0.63 mg (has no administration in time range)  ?montelukast (SINGULAIR) tablet 10 mg (has no administration in time range)  ?brimonidine (ALPHAGAN) 0.2 % ophthalmic solution 1 drop (has no administration in time range)  ?latanoprost (XALATAN) 0.005 % ophthalmic  solution 1 drop (has no administration in time range)  ?acetaminophen (TYLENOL) tablet 650 mg (has no administration in time range)  ?  Or  ?acetaminophen (TYLENOL) suppository 650 mg (has no administration in time range)  ?oxyCODONE (Oxy IR/ROXICODONE) immediate release tablet 5 mg (has no administration in time range)  ?morphine (PF) 2 MG/ML injection 2 mg (has no administration in time range)  ?ondansetron (ZOFRAN) tablet 4 mg (  has no administration in time range)  ?  Or  ?ondansetron (ZOFRAN) injection 4 mg (has no administration in time range)  ?ipratropium-albuterol (DUONEB) 0.5-2.5 (3) MG/3ML nebulizer solution 3 mL (3 mLs Nebulization Given 10/22/21 0000)  ?predniSONE (DELTASONE) tablet 60 mg (60 mg Oral Given 10/21/21 2357)  ?0.9 %  sodium chloride infusion (10 mL/hr Intravenous New Bag/Given 10/22/21 0249)  ?furosemide (LASIX) injection 20 mg (20 mg Intravenous Given 10/22/21 0139)  ?  ? ?Procedures  /  Critical Care ?.Critical Care ?Performed by: Maudie Flakes, MD ?Authorized by: Maudie Flakes, MD  ? ?Critical care provider statement:  ?  Critical care time (minutes):  35 ?  Critical care was necessary to treat or prevent imminent or life-threatening deterioration of the following conditions: Symptomatic anemia. ?  Critical care was time spent personally by me on the following activities:  Development of treatment plan with patient or surrogate, discussions with consultants, evaluation of patient's response to treatment, examination of patient, ordering and review of laboratory studies, ordering and review of radiographic studies, ordering and performing treatments and interventions, pulse oximetry, re-evaluation of patient's condition and review of old charts ? ?ED Course and Medical Decision Making  ?Initial Impression and Ddx ?Asthma initially suspected however lungs are clear with no wheezing.  Patient sitting comfortably in no respiratory distress, vital signs normal.  Other considerations include  ACS, PE given the chest discomfort.  Awaiting labs, chest x-ray. ? ?Past medical/surgical history that increases complexity of ED encounter: A-fib ? ?Interpretation of Diagnostics ?I personally reviewed

## 2021-10-22 NOTE — Hospital Course (Addendum)
81 y.o. female with medical history significant of with history of asthma, atrial fibrillation, CVA, esophageal stricture, GERD, hiatal hernia, hyperlipidemia, hypertension, hypothyroidism, rheumatoid arthritis, and more presents ED with a chief complaint of dyspnea.  Patient reports that she had dyspnea for 3 weeks.  Its been getting worse since it started, very gradually.  She noted some wheezing in the beginning.  She has been using her inhalers and nebulizer more often but only gets temporary relief with these treatments.  She did have 1 episode of chest pain 2 days ago in her left lower chest.  It was there briefly and felt like a pressure.  Patient reports that it has not recurred.  Her dyspnea is worse with exertion.  She does not have orthopnea.  Patient was found to have a low hemoglobin in the ED.  She denies any overt bleeding.  She thinks she may have had 1 melanotic stool.  She denies epistaxis, large bruises, vaginal bleeding, hematuria.  Patient reports some occasional nausea and abdominal pain.  When the abdominal pain is present in the left lower quadrant and its mild.  She takes Tums and that relieves the pain.  It usually occurs just after mealtime.  Patient reports that she drinks a lot of water at home.  She does this because without it she has some dysuria.  Patient has no other complaints at this time. ?  ?Patient does not smoke, does not drink alcohol, does not use illicit drugs.  She is not vaccinated for COVID.  She is DNI stating and never want to be put on the machine. ? ?10/23/2021: EGD done 3/30 with findings of Multiple hemorrhagic gastric polyps. 5 oozing polyps were removed; clips placed.  Pt started on carafate x 3 days, pantoprazole BID and clears diet.  ? ?10/24/2021: Pt feeling well.  Hg stable at 8.0. DC home with home health.  GI team arranged for CBC check in 1 week and follow up with Dr. Fuller Plan.   ?

## 2021-10-22 NOTE — Assessment & Plan Note (Addendum)
Continue amiodarone and diltiazem ?Per GI service restarted rivaroxaban 3/31 with supper and Hg stable at 8.0 ?GI arranged for CBC to be rechecked in 1 week ?

## 2021-10-23 ENCOUNTER — Telehealth: Payer: Self-pay | Admitting: Gastroenterology

## 2021-10-23 DIAGNOSIS — Z7901 Long term (current) use of anticoagulants: Secondary | ICD-10-CM | POA: Diagnosis not present

## 2021-10-23 DIAGNOSIS — I480 Paroxysmal atrial fibrillation: Secondary | ICD-10-CM | POA: Diagnosis not present

## 2021-10-23 DIAGNOSIS — J45909 Unspecified asthma, uncomplicated: Secondary | ICD-10-CM | POA: Diagnosis not present

## 2021-10-23 DIAGNOSIS — D649 Anemia, unspecified: Secondary | ICD-10-CM | POA: Diagnosis not present

## 2021-10-23 LAB — BPAM RBC
Blood Product Expiration Date: 202305042359
Blood Product Expiration Date: 202305042359
ISSUE DATE / TIME: 202303300255
ISSUE DATE / TIME: 202303300612
Unit Type and Rh: 5100
Unit Type and Rh: 5100

## 2021-10-23 LAB — TYPE AND SCREEN
ABO/RH(D): O POS
Antibody Screen: NEGATIVE
Unit division: 0
Unit division: 0

## 2021-10-23 LAB — CBC
HCT: 26.8 % — ABNORMAL LOW (ref 36.0–46.0)
Hemoglobin: 7.9 g/dL — ABNORMAL LOW (ref 12.0–15.0)
MCH: 22.7 pg — ABNORMAL LOW (ref 26.0–34.0)
MCHC: 29.5 g/dL — ABNORMAL LOW (ref 30.0–36.0)
MCV: 77 fL — ABNORMAL LOW (ref 80.0–100.0)
Platelets: 219 10*3/uL (ref 150–400)
RBC: 3.48 MIL/uL — ABNORMAL LOW (ref 3.87–5.11)
RDW: 20.1 % — ABNORMAL HIGH (ref 11.5–15.5)
WBC: 8.9 10*3/uL (ref 4.0–10.5)
nRBC: 0.2 % (ref 0.0–0.2)

## 2021-10-23 MED ORDER — VITAMIN B-12 1000 MCG PO TABS
1000.0000 ug | ORAL_TABLET | Freq: Every day | ORAL | Status: DC
  Administered 2021-10-23 – 2021-10-26 (×4): 1000 ug via ORAL
  Filled 2021-10-23 (×4): qty 1

## 2021-10-23 MED ORDER — MOMETASONE FURO-FORMOTEROL FUM 200-5 MCG/ACT IN AERO
2.0000 | INHALATION_SPRAY | Freq: Two times a day (BID) | RESPIRATORY_TRACT | Status: DC
  Administered 2021-10-23 – 2021-10-26 (×7): 2 via RESPIRATORY_TRACT
  Filled 2021-10-23: qty 8.8

## 2021-10-23 MED ORDER — PREDNISONE 10 MG PO TABS
10.0000 mg | ORAL_TABLET | Freq: Every day | ORAL | Status: DC
  Administered 2021-10-23 – 2021-10-26 (×4): 10 mg via ORAL
  Filled 2021-10-23 (×4): qty 1

## 2021-10-23 MED ORDER — RIVAROXABAN 15 MG PO TABS
15.0000 mg | ORAL_TABLET | Freq: Every day | ORAL | Status: DC
  Administered 2021-10-23 – 2021-10-25 (×3): 15 mg via ORAL
  Filled 2021-10-23 (×3): qty 1

## 2021-10-23 MED ORDER — DOCUSATE SODIUM 100 MG PO CAPS
100.0000 mg | ORAL_CAPSULE | Freq: Two times a day (BID) | ORAL | Status: DC
  Administered 2021-10-23 – 2021-10-25 (×5): 100 mg via ORAL
  Filled 2021-10-23 (×6): qty 1

## 2021-10-23 MED ORDER — BRIMONIDINE TARTRATE 0.15 % OP SOLN: 1.0000 [drp] | Freq: Three times a day (TID) | OPHTHALMIC | Status: DC

## 2021-10-23 MED ORDER — RIVAROXABAN 20 MG PO TABS: 20.0000 mg | ORAL_TABLET | Freq: Every day | ORAL | Status: DC

## 2021-10-23 MED ORDER — IRBESARTAN 75 MG PO TABS
37.5000 mg | ORAL_TABLET | Freq: Every day | ORAL | Status: DC
  Administered 2021-10-23 – 2021-10-26 (×2): 37.5 mg via ORAL
  Filled 2021-10-23 (×4): qty 1

## 2021-10-23 MED ORDER — LOPERAMIDE HCL 2 MG PO CAPS
2.0000 mg | ORAL_CAPSULE | ORAL | Status: DC | PRN
  Administered 2021-10-23 (×2): 2 mg via ORAL
  Filled 2021-10-23 (×2): qty 1

## 2021-10-23 MED ORDER — FERROUS SULFATE 325 (65 FE) MG PO TABS
325.0000 mg | ORAL_TABLET | Freq: Every day | ORAL | Status: DC
  Administered 2021-10-23 – 2021-10-26 (×4): 325 mg via ORAL
  Filled 2021-10-23 (×4): qty 1

## 2021-10-23 NOTE — Assessment & Plan Note (Signed)
--  resume home daily prednisone 10 mg  ?--protonix BID for GI protection ?

## 2021-10-23 NOTE — Progress Notes (Addendum)
Patient had secretary call me for SOB.  Patient's BS were clear and diminished.  Checked her O2 sat which turned out to be 92%.  Patient stated that she had a swallow of coffee and began to cough.  Patient has also been anemic this visit so placed patient on 2L [place] to see if that helped her.  Will continue to monitor. ?

## 2021-10-23 NOTE — Evaluation (Signed)
Physical Therapy Evaluation ?Patient Details ?Name: Stacey Davenport ?MRN: [id number] ?DOB: 13-Jul-1941 ?Today's Date: 10/23/2021 ? ?History of Present Illness ? Stacey Davenport is a 81 y.o. female with medical history significant of with history of asthma, atrial fibrillation, CVA, esophageal stricture, GERD, hiatal hernia, hyperlipidemia, hypertension, hypothyroidism, rheumatoid arthritis, and more presents ED with a chief complaint of dyspnea.  Patient reports that she had dyspnea for 3 weeks.  Its been getting worse since it started, very gradually.  She noted some wheezing in the beginning.  She has been using her inhalers and nebulizer more often but only gets temporary relief with these treatments.  She did have 1 episode of chest pain 2 days ago in her left lower chest.  It was there briefly and felt like a pressure.  Patient reports that it has not recurred.  Her dyspnea is worse with exertion.  She does not have orthopnea.  Patient was found to have a low hemoglobin in the ED.  She denies any overt bleeding.  She thinks she may have had 1 melanotic stool.  She denies epistaxis, large bruises, vaginal bleeding, hematuria.  Patient reports some occasional nausea and abdominal pain.  When the abdominal pain is present in the left lower quadrant and its mild.  She takes Tums and that relieves the pain.  It usually occurs just after mealtime.  Patient reports that she drinks a lot of water at home.  She does this because without it she has some dysuria.  Patient has no other complaints at this time. ?  ?Clinical Impression ? Patient limited for functional mobility as stated below secondary to BLE weakness, fatigue and poor standing balance. Patient seated in chair at beginning of session. She demonstrates good MMT strength bilaterally but impaired functional strength with STS  requiring mod assist and several attempts to transfer to standing with RW. She demonstrates poor standing posture and decreased standing  tolerance. Patient limited by fatigue today and is left with GI NP. Patient will benefit from continued physical therapy in hospital and recommended venue below to increase strength, balance, endurance for safe ADLs and gait. ?   ?   ? ?Recommendations for follow up therapy are one component of a multi-disciplinary discharge planning process, led by the attending physician.  Recommendations may be updated based on patient status, additional functional criteria and insurance authorization. ? ?Follow Up Recommendations Skilled nursing-short term rehab (<3 hours/day) ? ?  ?Assistance Recommended at Discharge Frequent or constant Supervision/Assistance  ?Patient can return home with the following ? A lot of help with walking and/or transfers;A lot of help with bathing/dressing/bathroom;Assistance with cooking/housework;Help with stairs or ramp for entrance;Assist for transportation ? ?  ?Equipment Recommendations None recommended by PT  ?Recommendations for Other Services ?    ?  ?Functional Status Assessment Patient has had a recent decline in their functional status and demonstrates the ability to make significant improvements in function in a reasonable and predictable amount of time.  ? ?  ?Precautions / Restrictions Precautions ?Precautions: Fall ?Restrictions ?Weight Bearing Restrictions: No  ? ?  ? ?Mobility ? Bed Mobility ?  ?  ?  ?  ?  ?  ?  ?General bed mobility comments: seated in chair at beginning of session ?  ? ?Transfers ?Overall transfer level: Needs assistance ?Equipment used: Rolling walker (2 wheels) ?Transfers: Sit to/from Stand ?Sit to Stand: Mod assist ?  ?  ?  ?  ?  ?General transfer comment: labored, slow, requires assist  to power up, difficulty transfering to full standing position, cueing for sequencing/posture ?  ? ?Ambulation/Gait ?  ?  ?  ?  ?  ?  ?  ?  ? ?Stairs ?  ?  ?  ?  ?  ? ?Wheelchair Mobility ?  ? ?Modified Rankin (Stroke Patients Only) ?  ? ?  ? ?Balance Overall balance assessment:  Needs assistance ?  ?Sitting balance-Leahy Scale: Good ?Sitting balance - Comments: seated in chair ?  ?Standing balance support: Bilateral upper extremity supported ?Standing balance-Leahy Scale: Poor ?Standing balance comment: with RW ?  ?  ?  ?  ?  ?  ?  ?  ?  ?  ?  ?   ? ? ? ?Pertinent Vitals/Pain Pain Assessment ?Pain Assessment: No/denies pain  ? ? ?Home Living Family/patient expects to be discharged to:: Private residence ?Living Arrangements: Alone ?Available Help at Discharge: Personal care attendant;Available PRN/intermittently ?Type of Home: Apartment ?Home Access: Level entry ?  ?  ?  ?  ?Home Equipment: Conservation officer, nature (2 wheels);Wheelchair - manual ?   ?  ?Prior Function Prior Level of Function : Needs assist ?  ?  ?  ?  ?  ?  ?Mobility Comments: houshold ambulator with RW ?ADLs Comments: aid assists; aid 1 day 7 hours and 3-4 other days/week for 3.5 hours ?  ? ? ?Hand Dominance  ?   ? ?  ?Extremity/Trunk Assessment  ? Upper Extremity Assessment ?Upper Extremity Assessment: Generalized weakness ?  ? ?Lower Extremity Assessment ?Lower Extremity Assessment: Generalized weakness ?  ? ?Cervical / Trunk Assessment ?Cervical / Trunk Assessment: Kyphotic  ?Communication  ? Communication: No difficulties  ?Cognition Arousal/Alertness: Awake/alert ?Behavior During Therapy: [facility] for tasks assessed/performed ?Overall Cognitive Status: Within Functional Limits for tasks assessed ?  ?  ?  ?  ?  ?  ?  ?  ?  ?  ?  ?  ?  ?  ?  ?  ?  ?  ?  ? ?  ?General Comments   ? ?  ?Exercises    ? ?Assessment/Plan  ?  ?PT Assessment Patient needs continued PT services  ?PT Problem List Decreased strength;Decreased mobility;Decreased activity tolerance;Decreased balance ? ?   ?  ?PT Treatment Interventions DME instruction;Therapeutic exercise;Gait training;Balance training;Neuromuscular re-education;Stair training;Functional mobility training;Therapeutic activities;Patient/family education   ? ?PT Goals (Current goals can be found in  the Care Plan section)  ?Acute Rehab PT Goals ?Patient Stated Goal: Return home ?PT Goal Formulation: With patient ?Time For Goal Achievement: 11/06/21 ?Potential to Achieve Goals: Fair ? ?  ?Frequency Min 3X/week ?  ? ? ?Co-evaluation   ?  ?  ?  ?  ? ? ?  ?AM-PAC PT "6 Clicks" Mobility  ?Outcome Measure Help needed turning from your back to your side while in a flat bed without using bedrails?: A Little ?Help needed moving from lying on your back to sitting on the side of a flat bed without using bedrails?: A Lot ?Help needed moving to and from a bed to a chair (including a wheelchair)?: A Lot ?Help needed standing up from a chair using your arms (e.g., wheelchair or bedside chair)?: A Lot ?Help needed to walk in hospital room?: A Lot ?Help needed climbing 3-5 steps with a railing? : A Lot ?6 Click Score: 13 ? ?  ?End of Session Equipment Utilized During Treatment: Gait belt ?Activity Tolerance: Patient tolerated treatment well;Patient limited by fatigue ?Patient left: in chair;with call bell/phone within  reach;Other (comment);with chair alarm set (GI NP) ?Nurse Communication: Mobility status ?PT Visit Diagnosis: Unsteadiness on feet (R26.81);Other abnormalities of gait and mobility (R26.89);Muscle weakness (generalized) (M62.81) ?  ? ?Time: 3291-9166 ?PT Time Calculation (min) (ACUTE ONLY): 16 min ? ? ?Charges:   PT Evaluation ?$PT Eval Low Complexity: 1 Low ?  ?  ?   ? ? ?2:57 PM, 10/23/21 ?Mearl Latin PT, DPT ?Physical Therapist at [facility] ?[facility] ? ? ?

## 2021-10-23 NOTE — Progress Notes (Signed)
? ?Gastroenterology Progress Note  ? ?Referring Provider: No ref. provider found ?Primary Care Physician:  Janifer Adie, MD ?Primary Gastroenterologist:  Dr. Kennedy Bucker ? ?Patient ID: Stacey Davenport; [id number]; 1941-06-12  ? ? ?Subjective  ? ?Patient reports doing much better. She states she did not get much sleep last night but overall is doing okay. She reports she had most of her breakfast and was just busing around lunch with PT and such and had not had much of her liquids for lunch. She reports a good appetite at home. Denies shortness of breath, abdominal pain, N/V.  ? ? ?Objective  ? ?Vital signs in last 24 hours ?Temp:  [97.7 ?F (36.5 ?C)-98.5 ?F (36.9 ?C)] 98.2 ?F (36.8 ?C) (03/31 0615) ?Pulse Rate:  [63-70] 66 (03/31 0615) ?Resp:  [16-18] 18 (03/30 2109) ?BP: (103-163)/(51-68) 163/58 (03/31 0615) ?SpO2:  [93 %-98 %] 94 % (03/31 0815) ?Weight:  [63.5 kg] 63.5 kg (03/30 1310) ?Last BM Date : 10/22/21 ? ?Physical Exam ?General:   Alert and oriented, pleasant ?Head:  Normocephalic and atraumatic. ?Eyes:  No icterus, sclera clear. Conjuctiva pink.  ?Heart:  S1, S2 present, no murmurs noted.  ?Lungs: Clear to auscultation bilaterally, without wheezing, rales, or rhonchi.  ?Abdomen:  Bowel sounds present, soft, non-tender, non-distended. No HSM or hernias noted. No rebound or guarding. No masses appreciated  ?Msk:  Symmetrical without gross deformities. Normal posture. ?Pulses:  Normal pulses noted. ?Extremities:  Without clubbing or edema. ?Neurologic:  Alert and  oriented x4;  grossly normal neurologically. ?Skin:  Warm and dry, intact without significant lesions.  ?Psych:  Alert and cooperative. Normal mood and affect. ? ?Intake/Output from previous day: ?03/30 0701 - 03/31 0700 ?In: 1320 [P.O.:720; I.V.:600] ?Out: 1300 [Urine:1300] ?Intake/Output this shift: ?No intake/output data recorded. ? ?Lab Results ? ?Recent Labs  ?  10/21/21 ?2356 10/22/21 ?1121 10/23/21 ?0431  ?WBC 6.1 4.5 8.9  ?HGB 5.8* 8.3*  7.9*  ?HCT 21.7* 27.2* 26.8*  ?PLT 209 190 219  ? ?BMET ?Recent Labs  ?  10/21/21 ?2356 10/22/21 ?1121  ?NA 144 140  ?K 4.0 3.8  ?CL 117* 110  ?CO2 22 21*  ?GLUCOSE 109* 141*  ?BUN 19 14  ?CREATININE 0.74 0.60  ?CALCIUM 8.0* 8.0*  ? ?LFT ?Recent Labs  ?  10/21/21 ?2356 10/22/21 ?1121  ?PROT 5.9* 5.4*  ?ALBUMIN 3.2* 3.0*  ?AST 22 19  ?ALT 18 17  ?ALKPHOS 56 48  ?BILITOT 0.1* 0.6  ? ?PT/INR ?Recent Labs  ?  10/21/21 ?2356  ?LABPROT 15.9*  ?INR 1.3*  ? ?Hepatitis Panel ?No results for input(s): HEPBSAG, HCVAB, HEPAIGM, HEPBIGM in the last 72 hours. ? ? ?Studies/Results ?DG Chest Port 1 View ? ?Result Date: 10/21/2021 ?CLINICAL DATA:  Increasing shortness of breath for 3 days EXAM: PORTABLE CHEST 1 VIEW COMPARISON:  05/05/2021 FINDINGS: Single frontal view of the chest demonstrates stable enlargement of the cardiac silhouette. There is increased central vascular congestion, with patchy bibasilar airspace disease and small bilateral effusions. No pneumothorax. No acute bony abnormalities. IMPRESSION: 1. Findings consistent with mild congestive heart failure. Electronically Signed   By: Randa Ngo M.D.   On: 10/21/2021 23:58  ? ?ECHOCARDIOGRAM COMPLETE ? ?Result Date: 10/22/2021 ?   ECHOCARDIOGRAM REPORT   Patient Name:   Stacey Davenport Date of Exam: 10/22/2021 Medical Rec #:  [id number]      Height:       63.0 in Accession #:    [id number]     Weight:  140.0 lb Date of Birth:  01-Jul-1941      BSA:          1.662 m? Patient Age:    81 years       BP:           157/60 mmHg Patient Gender: F              HR:           75 bpm. Exam Location:  [facility] Procedure: 2D Echo, Cardiac Doppler and Color Doppler Indications:    CHF  History:        Patient has prior history of Echocardiogram examinations, most                 recent 10/01/2011. Stroke, Arrythmias:Atrial Fibrillation; Risk                 Factors:Hypertension and Dyslipidemia.  Sonographer:    Wenda Low Referring Phys: [id number] ASIA B [place]  1. Left ventricular ejection fraction, by estimation, is 60 to 65%. The left ventricle has normal function. The left ventricle has no regional wall motion abnormalities. There is mild left ventricular hypertrophy of the posterior segment. Left ventricular diastolic parameters are consistent with Grade II diastolic dysfunction (pseudonormalization). Elevated left ventricular end-diastolic pressure.  2. Right ventricular systolic function is normal. The right ventricular size is normal. There is normal pulmonary artery systolic pressure. The estimated right ventricular systolic pressure is 42.6 mmHg.  3. Left atrial size was severely dilated.  4. The mitral valve is degenerative. No evidence of mitral valve regurgitation. No evidence of mitral stenosis. Severe mitral annular calcification.  5. The aortic valve is tricuspid. Aortic valve regurgitation is not visualized. Aortic valve sclerosis/calcification is present, without any evidence of aortic stenosis. Aortic valve area, by VTI measures 1.86 cm?Marland Kitchen Aortic valve mean gradient measures 10.0 mmHg. Aortic valve Vmax measures 2.11 m/s.  6. The inferior vena cava is normal in size with greater than 50% respiratory variability, suggesting right atrial pressure of 3 mmHg.  7. There is a trivial pericardial effusion that is circumferential. FINDINGS  Left Ventricle: Left ventricular ejection fraction, by estimation, is 60 to 65%. The left ventricle has normal function. The left ventricle has no regional wall motion abnormalities. The left ventricular internal cavity size was normal in size. There is  mild left ventricular hypertrophy of the posterior segment. Left ventricular diastolic parameters are consistent with Grade II diastolic dysfunction (pseudonormalization). Elevated left ventricular end-diastolic pressure. Right Ventricle: The right ventricular size is normal. No increase in right ventricular wall thickness. Right ventricular systolic function is  normal. There is normal pulmonary artery systolic pressure. The tricuspid regurgitant velocity is 2.29 m/s, and  with an assumed right atrial pressure of 3 mmHg, the estimated right ventricular systolic pressure is 83.4 mmHg. Left Atrium: Left atrial size was severely dilated. Right Atrium: Right atrial size was normal in size. Pericardium: Trivial pericardial effusion is present. The pericardial effusion is circumferential. Mitral Valve: The mitral valve is degenerative in appearance. Severe mitral annular calcification. No evidence of mitral valve regurgitation. No evidence of mitral valve stenosis. MV peak gradient, 10.1 mmHg. The mean mitral valve gradient is 4.0 mmHg. Tricuspid Valve: The tricuspid valve is normal in structure. Tricuspid valve regurgitation is trivial. No evidence of tricuspid stenosis. Aortic Valve: The aortic valve is tricuspid. Aortic valve regurgitation is not visualized. Aortic valve sclerosis/calcification is present, without any evidence of aortic stenosis. Aortic valve mean gradient measures  10.0 mmHg. Aortic valve peak gradient  measures 17.8 mmHg. Aortic valve area, by VTI measures 1.86 cm?. Pulmonic Valve: The pulmonic valve was normal in structure. Pulmonic valve regurgitation is not visualized. No evidence of pulmonic stenosis. Aorta: The aortic root is normal in size and structure. Venous: The inferior vena cava is normal in size with greater than 50% respiratory variability, suggesting right atrial pressure of 3 mmHg. IAS/Shunts: No atrial level shunt detected by color flow Doppler.  LEFT VENTRICLE PLAX 2D LVIDd:         5.20 cm     Diastology LVIDs:         2.60 cm     LV e' medial:    6.20 cm/s LV PW:         1.30 cm     LV E/e' medial:  19.7 LV IVS:        1.00 cm     LV e' lateral:   6.74 cm/s LVOT diam:     2.00 cm     LV E/e' lateral: 18.1 LV SV:         97 LV SV Index:   59 LVOT Area:     3.14 cm?  LV Volumes (MOD) LV vol d, MOD A2C: 66.6 ml LV vol d, MOD A4C: 85.4 ml LV  vol s, MOD A2C: 24.3 ml LV vol s, MOD A4C: 16.6 ml LV SV MOD A2C:     42.3 ml LV SV MOD A4C:     85.4 ml LV SV MOD BP:      56.8 ml RIGHT VENTRICLE RV Basal diam:  3.25 cm RV Mid diam:    3.20 cm RV S prime:

## 2021-10-23 NOTE — Telephone Encounter (Signed)
Stacey Davenport/Stacey Davenport - This is a patient of Dr. Fuller Plan with [facility], we saw her in the hospital for symptomatic anemia and upper GI bleed.  Could you please refer her back to [place] for her to follow-up with Dr. Fuller Plan. ? ?Stacey Davenport Davenport -could you please arrange repeat CBC in 1 week for this patient (diagnosis IDA) and see if these labs can be forwarded to Dr. Fuller Plan. ? ?Venetia Night, MSN, FNP-BC, AGACNP-BC ?[facility] ? ?

## 2021-10-23 NOTE — Anesthesia Postprocedure Evaluation (Signed)
Anesthesia Post Note ? ?Patient: DAILY DOE ? ?Procedure(s) Performed: ESOPHAGOGASTRODUODENOSCOPY (EGD) WITH PROPOFOL ?POLYPECTOMY ?BIOPSY ?HEMOSTASIS CLIP PLACEMENT ? ?Patient location during evaluation: Phase II ?Anesthesia Type: General ?Level of consciousness: awake ?Pain management: pain level controlled ?Vital Signs Assessment: post-procedure vital signs reviewed and stable ?Respiratory status: spontaneous breathing and respiratory function stable ?Cardiovascular status: blood pressure returned to baseline and stable ?Postop Assessment: no headache and no apparent nausea or vomiting ?Anesthetic complications: no ?Comments: Late entry ? ? ?No notable events documented. ? ? ?Last Vitals:  ?Vitals:  ? 10/23/21 0615 10/23/21 0815  ?BP: (!) 163/58   ?Pulse: 66   ?Resp:    ?Temp: 36.8 ?C   ?SpO2: 96% 94%  ?  ?Last Pain:  ?Vitals:  ? 10/23/21 0615  ?TempSrc: Oral  ?PainSc:   ? ? ?  ?  ?  ?  ?  ?  ? ?Louann Sjogren ? ? ? ? ?

## 2021-10-23 NOTE — Progress Notes (Signed)
?PROGRESS NOTE ? ? ?NICKEY CANEDO  BLT:[id number] DOB: 07/07/1941 DOA: 10/21/2021 ?PCP: Janifer Adie, MD  ? ?Chief Complaint  ?Patient presents with  ? Shortness of Breath  ? ?Level of care: Med-Surg ? ?Brief Admission History:  ? 81 y.o. female with medical history significant of with history of asthma, atrial fibrillation, CVA, esophageal stricture, GERD, hiatal hernia, hyperlipidemia, hypertension, hypothyroidism, rheumatoid arthritis, and more presents ED with a chief complaint of dyspnea.  Patient reports that she had dyspnea for 3 weeks.  Its been getting worse since it started, very gradually.  She noted some wheezing in the beginning.  She has been using her inhalers and nebulizer more often but only gets temporary relief with these treatments.  She did have 1 episode of chest pain 2 days ago in her left lower chest.  It was there briefly and felt like a pressure.  Patient reports that it has not recurred.  Her dyspnea is worse with exertion.  She does not have orthopnea.  Patient was found to have a low hemoglobin in the ED.  She denies any overt bleeding.  She thinks she may have had 1 melanotic stool.  She denies epistaxis, large bruises, vaginal bleeding, hematuria.  Patient reports some occasional nausea and abdominal pain.  When the abdominal pain is present in the left lower quadrant and its mild.  She takes Tums and that relieves the pain.  It usually occurs just after mealtime.  Patient reports that she drinks a lot of water at home.  She does this because without it she has some dysuria.  Patient has no other complaints at this time. ?  ?Patient does not smoke, does not drink alcohol, does not use illicit drugs.  She is not vaccinated for COVID.  She is DNI stating and never want to be put on the machine. ? ?10/23/2021: EGD done 3/30 with findings of Multiple hemorrhagic gastric polyps. 5 oozing polyps were removed; clips placed.  Pt started on carafate x 3 days, pantoprazole BID and clears  diet.  ?  ?Assessment and Plan: ?* Symptomatic anemia ?Dyspnea for 3 weeks ?Reports one possible melanotic stool ?Hgb 5.8 ?Last hgb 12.1 in 04/2021 ?FOBT + ?Continue protonix '40mg'$  IV BID ?EGD done 3/30 with findings of Multiple hemorrhagic gastric polyps. 5 oozing polyps were removed; clips placed.  Pt started on carafate x 3 days, clears diet.   ?-Hg improved to 7.8 today, recheck in AM ?-per GI team can restart xarelto today with supper.   ? ? ?Anticoagulated ?--restarting rivaroxaban 3/31 ? ?GI bleed ?--From bleeding gastric polyps that were clipped on 10/22/21 by Dr. Sydell Axon ? ? ?Elevated brain natriuretic peptide (BNP) level ?No formal history of CHF  ?BNP 407, mild CHF on CXR ?Physical exam reveals venous stasis changes in the lower extremities BL with some blisters - no current weeping ?Echo 3/30:  LVEF 60-65% with grade 2 DD  ?--Pt has diuresed about 1L since being given lasix treatment.  ? ? ? ? ?Depression ?Continue zoloft ? ?Mild protein-calorie malnutrition ([place]) ?Albumin 3.2 ?Likely 2/2 poor PO intake ?Advance diet as tolerated ?She is tolerating clears well at this time ? ?Hypothyroidism ?Continue synthroid ? ?Atrial fibrillation ([place]) ?Continue amiodarone and diltiazem ?Per GI service can restart rivaroxaban 3/31 with supper ? ?GERD ?PPI's flagged as allergies, but patient does not remember the reaction ?Given GI bleed, will start protonix '40mg'$  IV BID ?Consult GI ? ?Asthma, chronic, steroid-dependent ?--resume daily prednisone 10 mg / protonix for GI  protection ?Continue budesonide, prn xoponex, and montelukast  ? ?Hypertension ?Continue losartan and diltiazem ? ?DVT prophylaxis: rivaroxaban starting 3/31 ?Code Status: Partial  ?Family Communication:  ?Disposition: Status is: Inpatient ?Remains inpatient appropriate because: IV pantoprazole for GI bleed ?  ?Consultants:  ?GI  ?Procedures:  ?EGD: 10/22/21 ?Antimicrobials:  ?N/a  ?Subjective: ?Pt tolerating clears diet but having some shortness of breath.    ?Objective: ?Vitals:  ? 10/23/21 0433 10/23/21 0615 10/23/21 0815 10/23/21 0930  ?BP:  (!) 163/58    ?Pulse:  66    ?Resp:      ?Temp:  98.2 ?F (36.8 ?C)    ?TempSrc:  Oral    ?SpO2: 93% 96% 94% 95%  ?Weight:      ?Height:      ? ? ?Intake/Output Summary (Last 24 hours) at 10/23/2021 1214 ?Last data filed at 10/23/2021 0500 ?Gross per 24 hour  ?Intake 1320 ml  ?Output 300 ml  ?Net 1020 ml  ? ?Filed Weights  ? 10/21/21 2324 10/22/21 1310  ?Weight: 63.5 kg 63.5 kg  ? ?Examination: ? ?General exam: frail, elderly female, Appears calm and comfortable  ?Respiratory system: Clear to auscultation. Respiratory effort normal.  ?Cardiovascular system: normal S1 & S2 heard. No JVD, murmurs, rubs, gallops or clicks. No pedal edema. ?Gastrointestinal system: Abdomen is nondistended, soft and nontender. No organomegaly or masses felt. Normal bowel sounds heard. ?Central nervous system: Alert and oriented. No focal neurological deficits. ?Extremities: Symmetric 5 x 5 power. ?Skin: No rashes, lesions or ulcers. ?Psychiatry: Judgement and insight appear normal. Mood & affect appropriate.  ? ?Data Reviewed: I have personally reviewed following labs and imaging studies ? ?CBC: ?Recent Labs  ?Lab 10/21/21 ?2356 10/22/21 ?1121 10/23/21 ?0431  ?WBC 6.1 4.5 8.9  ?HGB 5.8* 8.3* 7.9*  ?HCT 21.7* 27.2* 26.8*  ?MCV 75.9* 76.6* 77.0*  ?PLT 209 190 219  ? ? ?Basic Metabolic Panel: ?Recent Labs  ?Lab 10/21/21 ?2356 10/22/21 ?1121  ?NA 144 140  ?K 4.0 3.8  ?CL 117* 110  ?CO2 22 21*  ?GLUCOSE 109* 141*  ?BUN 19 14  ?CREATININE 0.74 0.60  ?CALCIUM 8.0* 8.0*  ?MG  --  1.9  ? ? ?CBG: ?No results for input(s): GLUCAP in the last 168 hours. ? ?No results found for this or any previous visit (from the past 240 hour(s)).  ? ?Radiology Studies: ?DG Chest Port 1 View ? ?Result Date: 10/21/2021 ?CLINICAL DATA:  Increasing shortness of breath for 3 days EXAM: PORTABLE CHEST 1 VIEW COMPARISON:  05/05/2021 FINDINGS: Single frontal view of the chest demonstrates  stable enlargement of the cardiac silhouette. There is increased central vascular congestion, with patchy bibasilar airspace disease and small bilateral effusions. No pneumothorax. No acute bony abnormalities. IMPRESSION: 1. Findings consistent with mild congestive heart failure. Electronically Signed   By: Randa Ngo M.D.   On: 10/21/2021 23:58  ? ?ECHOCARDIOGRAM COMPLETE ? ?Result Date: 10/22/2021 ?   ECHOCARDIOGRAM REPORT   Patient Name:   ANDREA FERRER Date of Exam: 10/22/2021 Medical Rec #:  [id number]      Height:       63.0 in Accession #:    [id number]     Weight:       140.0 lb Date of Birth:  1940/11/14      BSA:          1.662 m? Patient Age:    40 years       BP:  157/60 mmHg Patient Gender: F              HR:           75 bpm. Exam Location:  [facility] Procedure: 2D Echo, Cardiac Doppler and Color Doppler Indications:    CHF  History:        Patient has prior history of Echocardiogram examinations, most                 recent 10/01/2011. Stroke, Arrythmias:Atrial Fibrillation; Risk                 Factors:Hypertension and Dyslipidemia.  Sonographer:    Wenda Low Referring Phys: [id number] ASIA B [place]  1. Left ventricular ejection fraction, by estimation, is 60 to 65%. The left ventricle has normal function. The left ventricle has no regional wall motion abnormalities. There is mild left ventricular hypertrophy of the posterior segment. Left ventricular diastolic parameters are consistent with Grade II diastolic dysfunction (pseudonormalization). Elevated left ventricular end-diastolic pressure.  2. Right ventricular systolic function is normal. The right ventricular size is normal. There is normal pulmonary artery systolic pressure. The estimated right ventricular systolic pressure is 57.9 mmHg.  3. Left atrial size was severely dilated.  4. The mitral valve is degenerative. No evidence of mitral valve regurgitation. No evidence of mitral stenosis. Severe mitral annular  calcification.  5. The aortic valve is tricuspid. Aortic valve regurgitation is not visualized. Aortic valve sclerosis/calcification is present, without any evidence of aortic stenosis. Aortic valve area

## 2021-10-23 NOTE — Plan of Care (Signed)
?  Problem: Acute Rehab PT Goals(only PT should resolve) ?Goal: Patient Will Transfer Sit To/From Stand ?Outcome: Progressing ?Flowsheets (Taken 10/23/2021 1459) ?Patient will transfer sit to/from stand: ? with minimal assist ? with min guard assist ?Goal: Pt Will Transfer Bed To Chair/Chair To Bed ?Outcome: Progressing ?Flowsheets (Taken 10/23/2021 1459) ?Pt will Transfer Bed to Chair/Chair to Bed: ? min guard assist ? with min assist ?Goal: Pt Will Ambulate ?Outcome: Progressing ?Flowsheets (Taken 10/23/2021 1459) ?Pt will Ambulate: ? 25 feet ? with least restrictive assistive device ? with minimal assist ?Goal: Pt/caregiver will Perform Home Exercise Program ?Outcome: Progressing ?Flowsheets (Taken 10/23/2021 1459) ?Pt/caregiver will Perform Home Exercise Program: ? For increased strengthening ? For improved balance ? Independently ? 3:00 PM, 10/23/21 ?Mearl Latin PT, DPT ?Physical Therapist at [facility] ?[facility] ? ?

## 2021-10-23 NOTE — Assessment & Plan Note (Signed)
--  restarting rivaroxaban 3/31 ?

## 2021-10-24 DIAGNOSIS — R7989 Other specified abnormal findings of blood chemistry: Secondary | ICD-10-CM | POA: Diagnosis not present

## 2021-10-24 DIAGNOSIS — D649 Anemia, unspecified: Secondary | ICD-10-CM | POA: Diagnosis not present

## 2021-10-24 DIAGNOSIS — I480 Paroxysmal atrial fibrillation: Secondary | ICD-10-CM | POA: Diagnosis not present

## 2021-10-24 DIAGNOSIS — Z7901 Long term (current) use of anticoagulants: Secondary | ICD-10-CM | POA: Diagnosis not present

## 2021-10-24 LAB — CBC
HCT: 27.5 % — ABNORMAL LOW (ref 36.0–46.0)
Hemoglobin: 8 g/dL — ABNORMAL LOW (ref 12.0–15.0)
MCH: 23.3 pg — ABNORMAL LOW (ref 26.0–34.0)
MCHC: 29.1 g/dL — ABNORMAL LOW (ref 30.0–36.0)
MCV: 80.2 fL (ref 80.0–100.0)
Platelets: 195 10*3/uL (ref 150–400)
RBC: 3.43 MIL/uL — ABNORMAL LOW (ref 3.87–5.11)
RDW: 20.4 % — ABNORMAL HIGH (ref 11.5–15.5)
WBC: 9.2 10*3/uL (ref 4.0–10.5)
nRBC: 0 % (ref 0.0–0.2)

## 2021-10-24 MED ORDER — PANTOPRAZOLE SODIUM 40 MG PO TBEC: 40.0000 mg | DELAYED_RELEASE_TABLET | Freq: Two times a day (BID) | ORAL | 2 refills | Status: AC

## 2021-10-24 MED ORDER — LEVALBUTEROL TARTRATE 45 MCG/ACT IN AERO
2.0000 | INHALATION_SPRAY | Freq: Three times a day (TID) | RESPIRATORY_TRACT | Status: DC
Start: 2021-10-24 — End: 2023-11-22

## 2021-10-24 MED ORDER — LORAZEPAM 0.5 MG PO TABS: 1.0000 mg | ORAL_TABLET | Freq: Every day | ORAL | Status: DC

## 2021-10-24 NOTE — Progress Notes (Signed)
SATURATION QUALIFICATIONS: (This note is used to comply with regulatory documentation for home oxygen) ? ?Patient Saturations on Room Air at Rest = 92% ? ?Patient Saturations on Room Air while Ambulating = 86% ? ?Patient Saturations on 2 Liters of oxygen while Ambulating = 92% ? ?Please briefly explain why patient needs home oxygen: COPD  ?

## 2021-10-24 NOTE — Discharge Instructions (Signed)
IMPORTANT INFORMATION: PAY CLOSE ATTENTION  ? ?PHYSICIAN DISCHARGE INSTRUCTIONS ? ?Follow with Primary care provider  Janifer Adie, MD  and other consultants as instructed by your Hospitalist Physician ? ?SEEK MEDICAL CARE OR RETURN TO EMERGENCY ROOM IF SYMPTOMS COME BACK, WORSEN OR NEW PROBLEM DEVELOPS  ? ?Please note: ?You were cared for by a hospitalist during your hospital stay. Every effort will be made to forward records to your primary care provider.  You can request that your primary care provider send for your hospital records if they have not received them.  Once you are discharged, your primary care physician will handle any further medical issues. Please note that NO REFILLS for any discharge medications will be authorized once you are discharged, as it is imperative that you return to your primary care physician (or establish a relationship with a primary care physician if you do not have one) for your post hospital discharge needs so that they can reassess your need for medications and monitor your lab values. ? ?Please get a complete blood count and chemistry panel checked by your Primary MD at your next visit, and again as instructed by your Primary MD. ? ?Get Medicines reviewed and adjusted: ?Please take all your medications with you for your next visit with your Primary MD ? ?Laboratory/radiological data: ?Please request your Primary MD to go over all hospital tests and procedure/radiological results at the follow up, please ask your primary care provider to get all Hospital records sent to his/her office. ? ?In some cases, they will be blood work, cultures and biopsy results pending at the time of your discharge. Please request that your primary care provider follow up on these results. ? ?If you are diabetic, please bring your blood sugar readings with you to your follow up appointment with primary care.   ? ?Please call and make your follow up appointments as soon as possible.   ? ?Also  Note the following: ?If you experience worsening of your admission symptoms, develop shortness of breath, life threatening emergency, suicidal or homicidal thoughts you must seek medical attention immediately by calling 911 or calling your MD immediately  if symptoms less severe. ? ?You must read complete instructions/literature along with all the possible adverse reactions/side effects for all the Medicines you take and that have been prescribed to you. Take any new Medicines after you have completely understood and accpet all the possible adverse reactions/side effects.  ? ?Do not drive when taking Pain medications or sleeping medications (Benzodiazepines) ? ?Do not take more than prescribed Pain, Sleep and Anxiety Medications. It is not advisable to combine anxiety,sleep and pain medications without talking with your primary care practitioner ? ?Special Instructions: If you have smoked or chewed Tobacco  in the last 2 yrs please stop smoking, stop any regular Alcohol  and or any Recreational drug use. ? ?Wear Seat belts while driving.  Do not drive if taking any narcotic, mind altering or controlled substances or recreational drugs or alcohol.  ? ? ? ? ? ? ?

## 2021-10-24 NOTE — Discharge Summary (Addendum)
Physician Discharge Summary  ?Stacey Davenport BPZ:[id number] DOB: Sep 16, 1940 DOA: 10/21/2021 ? ?PCP: Janifer Adie, MD ?GI: Dr. Fuller Plan ? ?Admit date: 10/21/2021 ?Discharge date: 10/24/2021 ? ?Admitted From:  Home ([facility]) ?Disposition: Declined SNF, Home with HH ([facility]) ? ?Recommendations for Outpatient Follow-up:  ?Follow up with PCP in 1 weeks ?Follow up with Dr. Fuller Plan GI in 1 month ?GI arranged for CBC check in 1 week ?Please follow up on the following pending results: biopsy results ?NO MRI STUDIES UNTIL CLIPS ARE GONE  ? ?Home Health:  PT, RN, DME Home Oxygen 2L/min ? ?Discharge Condition: STABLE   ?CODE STATUS: PARTIAL  ?DIET: soft foods diet   ? ?Brief Hospitalization Summary: ?Please see all hospital notes, images, labs for full details of the hospitalization. ? 81 y.o. female with medical history significant of with history of asthma, atrial fibrillation, CVA, esophageal stricture, GERD, hiatal hernia, hyperlipidemia, hypertension, hypothyroidism, rheumatoid arthritis, and more presents ED with a chief complaint of dyspnea.  Patient reports that she had dyspnea for 3 weeks.  Its been getting worse since it started, very gradually.  She noted some wheezing in the beginning.  She has been using her inhalers and nebulizer more often but only gets temporary relief with these treatments.  She did have 1 episode of chest pain 2 days ago in her left lower chest.  It was there briefly and felt like a pressure.  Patient reports that it has not recurred.  Her dyspnea is worse with exertion.  She does not have orthopnea.  Patient was found to have a low hemoglobin in the ED.  She denies any overt bleeding.  She thinks she may have had 1 melanotic stool.  She denies epistaxis, large bruises, vaginal bleeding, hematuria.  Patient reports some occasional nausea and abdominal pain.  When the abdominal pain is present in the left lower quadrant and its mild.  She takes Tums and that relieves the pain.  It usually occurs just  after mealtime.  Patient reports that she drinks a lot of water at home.  She does this because without it she has some dysuria.  Patient has no other complaints at this time. ?  ?Patient does not smoke, does not drink alcohol, does not use illicit drugs.  She is not vaccinated for COVID.  She is DNI stating and never want to be put on the machine. ? ?10/23/2021: EGD done 3/30 with findings of Multiple hemorrhagic gastric polyps. 5 oozing polyps were removed; clips placed.  Pt started on carafate x 3 days, pantoprazole BID and clears diet.  ? ?10/24/2021: Pt feeling well.  Hg stable at 8.0. DC home with home health.  GI team arranged for CBC check in 1 week and follow up with Dr. Fuller Plan.   ? ?HOSPITAL COURSE BY PROBLEM LIST ? ? ?Assessment and Plan: ?* Symptomatic anemia ?Dyspnea for 3 weeks ?Reports one possible melanotic stool ?Hgb 5.8 ?Last hgb 12.1 in 04/2021 ?FOBT + ?Continue protonix '40mg'$  IV BID ?EGD done 3/30 with findings of Multiple hemorrhagic gastric polyps. 5 oozing polyps were removed; clips placed.  Pt treated with carafate x 3 days, protonix 40 mg BID, iron supplement.  ?-Hg stable at 8 today.  GI service arranged for CBC test in 1 week and follow up with Dr. Fuller Plan.  ?-per GI team restarted xarelto today with supper on 3/31.   ? ? ?Anticoagulated ?--restarting rivaroxaban 3/31 ? ?GI bleed ?--From bleeding gastric polyps that were clipped on 10/22/21 by Dr. Sydell Axon ?--continue  protonix 40 mg BID, iron supplement ? ? ?Elevated brain natriuretic peptide (BNP) level ?No formal history of CHF  ?BNP 407, mild CHF on CXR ?Physical exam reveals venous stasis changes in the lower extremities BL with some blisters - no current weeping ?Echo 3/30:  LVEF 60-65% with grade 2 DD  ?--Pt has diuresed about 1L since being given lasix treatment.  ? ? ? ? ?Depression ?Continue zoloft ? ?Mild protein-calorie malnutrition ([place]) ?Albumin 3.2 ?Likely 2/2 poor PO intake ?Advance diet as tolerated ?She is tolerating diet well at  this time ? ?Hypothyroidism ?Continue synthroid ? ?Atrial fibrillation ([place]) ?Continue amiodarone and diltiazem ?Per GI service restarted rivaroxaban 3/31 with supper and Hg stable at 8.0 ?GI arranged for CBC to be rechecked in 1 week ? ?GERD ?PPI's flagged as allergies, but patient does not remember the reaction ?Given GI bleed patient was started on IV protonix and tolerated well, DC on protonix 40 mg po BID ?GI service arranged for follow up with Dr. Fuller Plan her outpatient GI.  ? ?Asthma, chronic, steroid-dependent ?--resume daily prednisone 10 mg / protonix for GI protection ?Continue home bronchodilators ? ?Hypertension ?Continue losartan and diltiazem ? ?Discharge Diagnoses:  ?Principal Problem: ?  Symptomatic anemia ?Active Problems: ?  Hypertension ?  Asthma, chronic, steroid-dependent ?  GERD ?  Atrial fibrillation ([place]) ?  Hypothyroidism ?  Mild protein-calorie malnutrition ([place]) ?  Depression ?  Elevated brain natriuretic peptide (BNP) level ?  GI bleed ?  Anticoagulated ? ? ?Discharge Instructions: ? ?Allergies as of 10/24/2021   ? ?   Reactions  ? Sulfamethoxazole Nausea And Vomiting  ? Bladder infection  ? Aciphex [rabeprazole Sodium]   ? Amoxicillin   ? Biaxin [clarithromycin] Hives, Itching  ? Cephalexin   ? Esomeprazole Magnesium   ? Takes Dexilant at home  ? Hydrocodone   ? Hydrocodone-acetaminophen   ? Ketek [telithromycin]   ? Latex   ? Levaquin [levofloxacin In D5w]   ? Lipitor [atorvastatin]   ? Macrobid [nitrofurantoin Macrocrystal]   ? Moxifloxacin   ? Nexium [esomeprazole Magnesium]   ? Rabeprazole Sodium   ? Takes Dexilant at home  ? Sulfonamide Derivatives   ? Vesicare [solifenacin]   ? Zithromax [azithromycin]   ? ?  ? ?  ?Medication List  ?  ? ?STOP taking these medications   ? ?furosemide 20 MG tablet ?Commonly known as: LASIX ?  ? ?  ? ?TAKE these medications   ? ?amiodarone 200 MG tablet ?Commonly known as: PACERONE ?Take 1 tablet (200 mg total) by mouth daily. ?  ?ARTIFICIAL TEAR  SOLUTION OP ?Apply 1 drop to eye 4 (four) times daily as needed (dry eye). ?  ?Bucks ?Apply 1 application. topically every 4 (four) hours as needed (joint pain). ?  ?brimonidine 0.1 % Soln ?Commonly known as: ALPHAGAN P ?Place 1 drop into the left eye in the morning, at noon, and at bedtime. ?What changed: Another medication with the same name was removed. Continue taking this medication, and follow the directions you see here. ?  ?budesonide-formoterol 160-4.5 MCG/ACT inhaler ?Commonly known as: SYMBICORT ?Inhale 2 puffs into the lungs 2 (two) times daily. ?  ?cholecalciferol 1000 units tablet ?Commonly known as: VITAMIN D ?Take 1,000 Units by mouth daily. ?  ?CRANBERRY PO ?Take 1 tablet by mouth daily as needed (bladder irritation). ?  ?CULTURELLE DIGESTIVE DAILY PO ?Take 1 capsule by mouth daily. Daily probiotic for IBS ?  ?cyanocobalamin 1000 MCG tablet ?Take 1,000 mcg  by mouth daily. ?  ?diltiazem 180 MG 24 hr capsule ?Commonly known as: TIAZAC ?Take 1 capsule (180 mg total) by mouth 2 (two) times daily. Take one tablet by mouth in the evening ?What changed: additional instructions ?  ?ferrous sulfate 325 (65 FE) MG tablet ?Take 325 mg by mouth daily with breakfast. ?  ?GAS-X PO ?Take 180 mg by mouth 2 (two) times daily as needed (gas pain). ?  ?GENTEAL TEARS NIGHT-TIME OP ?Apply 1 application. to eye every evening. ?  ?latanoprost 0.005 % ophthalmic solution ?Commonly known as: XALATAN ?PLACE 1 DROP IN [facility] EYE AT BEDTIME ?What changed: See the new instructions. ?  ?levalbuterol 0.63 MG/3ML nebulizer solution ?Commonly known as: XOPENEX ?Take 0.63 mg by nebulization every 8 (eight) hours as needed for wheezing or shortness of breath. ?  ?levalbuterol 45 MCG/ACT inhaler ?Commonly known as: Xopenex HFA ?Inhale 2 puffs into the lungs 3 (three) times daily. ?  ?levothyroxine 75 MCG tablet ?Commonly known as: SYNTHROID ?Take 75 mcg by mouth daily before breakfast. Take once daily ?   ?lidocaine-prilocaine cream ?Commonly known as: EMLA ?Apply 1 application. topically as needed (prior to podiatry treatment). ?  ?loperamide 2 MG capsule ?Commonly known as: IMODIUM ?Take 2 mg by mouth every 4 (four) hours as

## 2021-10-24 NOTE — [place] FL2 (Signed)
?[place] MEDICAID FL2 LEVEL OF CARE SCREENING TOOL  ?  ? ?IDENTIFICATION  ?Patient Name: ?Stacey Davenport Birthdate: September 08, 1940 Sex: female Admission Date (Current Location): ?10/21/2021  ?[place] and [place] Number: ? [place] and Address:  ?[facility] [address], [place] ?     Provider Number: ?[id number]  ?Attending Physician Name and Address:  ?Murlean Iba, MD ? Relative Name and Phone Number:  ?  ?   ?Current Level of Care: ?Hospital Recommended Level of Care: ?[place] Prior Approval Number: ?  ? ?Date Approved/Denied: ?  PASRR Number: ?[id number] A ? ?Discharge Plan: ?SNF ?  ? ?Current Diagnoses: ?Patient Active Problem List  ? Diagnosis Date Noted  ? Symptomatic anemia 10/22/2021  ? Mild protein-calorie malnutrition ([place]) 10/22/2021  ? Depression 10/22/2021  ? Elevated brain natriuretic peptide (BNP) level 10/22/2021  ? GI bleed 10/22/2021  ? Anticoagulated   ? 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 Irregular heart beat 08/15/2017  ? Primary open angle glaucoma of both eyes, mild stage 04/11/2017  ? High risk medication use 09/03/2013  ? Multiple drug allergies 09/03/2013  ? 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 Hypothyroidism 04/23/2013  ? Hyperlipidemia 12/06/2012  ? CVA History 12/06/2012  ? Atrial fibrillation ([place]) 09/30/2011  ? Hypertension 08/20/2009  ? Glaucoma 04/17/2009  ? 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 ESOPHAGEAL STRICTURE 04/17/2009  ? GERD 04/17/2009  ? Rheumatoid arthritis(714.0) 04/17/2009  ? ? ?Orientation RESPIRATION BLADDER Height & Weight   ?  ?Self, Place ? Normal, O2 (2L) Continent, External catheter Weight: 139 lb 15.9 oz (63.5 kg) ?Height:  '5\' 3"'$  (160 cm)  ?BEHAVIORAL SYMPTOMS/MOOD NEUROLOGICAL BOWEL NUTRITION STATUS  ?    Continent Diet (See discharge summary)  ?AMBULATORY STATUS COMMUNICATION OF NEEDS Skin   ?Extensive Assist Verbally Normal ?  ?  ?  ?    ?      ?     ? ? ?Personal Care Assistance Level of Assistance  ?Bathing, Feeding, Dressing Bathing Assistance: Limited assistance ?Feeding assistance: Limited assistance ?Dressing Assistance: Limited assistance ?   ? ?Functional Limitations Info  ?Sight, Hearing, Speech Sight Info: Adequate ?Hearing Info: Adequate ?Speech Info: Adequate  ? ? ?SPECIAL CARE FACTORS FREQUENCY  ?PT (By licensed PT), OT (By licensed OT)   ?  ?PT Frequency: 5x weekly ?OT Frequency: 5x weekly ?  ?  ?  ?   ? ? ?Contractures Contractures Info: Not present  ? ? ?Additional Factors Info  ?Code Status, Allergies Code Status Info: Partial ?Allergies Info: Sulfamethoxazole   Aciphex (Rabeprazole Sodium)   Amoxicillin   Biaxin (Clarithromycin)   Cephalexin   Esomeprazole Magnesium   Hydrocodone   Hydrocodone-acetaminophen   Ketek (Telithromycin)   Latex   Levaquin (Levofloxacin In D5w)   Lipitor (Atorvastatin)   Macrobid (Nitrofurantoin Macrocrystal)   Moxifloxacin   Nexium (Esomeprazole Magnesium)   Rabeprazole Sodium   Sulfonamide Derivatives   Vesicare (Solifenacin)   Zithromax (Azithromycin) ?  ?  ?  ?   ? ?Current Medications (10/24/2021):  This is the current hospital active medication list ?Current Facility-Administered Medications  ?Medication Dose Route Frequency Provider Last Rate Last Admin  ? acetaminophen (TYLENOL) tablet 650 mg  650 mg Oral Q6H PRN Daneil Dolin, MD   650 mg at 10/24/21 5885  ? Or  ? acetaminophen (TYLENOL) suppository 650 mg  650 mg Rectal Q6H PRN Rourk, Cristopher Estimable, MD      ?  amiodarone (PACERONE) tablet 200 mg  200 mg Oral Daily Daneil Dolin, MD   200 mg at 10/23/21 0911  ? brimonidine (ALPHAGAN) 0.2 % ophthalmic solution 1 drop  1 drop Left Eye Q8H Daneil Dolin, MD   1 drop at 10/24/21 0533  ? diltiazem (CARDIZEM CD) 24 hr capsule 180 mg  180 mg Oral BID Daneil Dolin, MD   180 mg at 10/23/21 2111  ? docusate sodium (COLACE) capsule 100 mg  100 mg Oral BID Johnson, Clanford L, MD   100 mg at 10/24/21 1039  ?  ferrous sulfate tablet 325 mg  325 mg Oral Q breakfast Wynetta Emery, Clanford L, MD   325 mg at 10/24/21 0816  ? irbesartan (AVAPRO) tablet 37.5 mg  37.5 mg Oral Daily Johnson, Clanford L, MD   37.5 mg at 10/23/21 1223  ? latanoprost (XALATAN) 0.005 % ophthalmic solution 1 drop  1 drop Both Eyes QHS Daneil Dolin, MD   1 drop at 10/23/21 2112  ? levalbuterol (XOPENEX) nebulizer solution 0.63 mg  0.63 mg Nebulization Q8H PRN Daneil Dolin, MD      ? levothyroxine (SYNTHROID) tablet 75 mcg  75 mcg Oral QAC breakfast Daneil Dolin, MD   75 mcg at 10/24/21 7341  ? loperamide (IMODIUM) capsule 2 mg  2 mg Oral Q4H PRN Murlean Iba, MD   2 mg at 10/23/21 9379  ? LORazepam (ATIVAN) tablet 0.5 mg  0.5 mg Oral Q6H PRN Rourk, Cristopher Estimable, MD      ? mometasone-formoterol [facility]) 200-5 MCG/ACT inhaler 2 puff  2 puff Inhalation BID Wynetta Emery, Clanford L, MD   2 puff at 10/24/21 0817  ? montelukast (SINGULAIR) tablet 10 mg  10 mg Oral QPM Daneil Dolin, MD   10 mg at 10/23/21 1715  ? morphine (PF) 2 MG/ML injection 2 mg  2 mg Intravenous Q2H PRN Rourk, Cristopher Estimable, MD      ? ondansetron [facility]) tablet 4 mg  4 mg Oral Q6H PRN Daneil Dolin, MD      ? Or  ? ondansetron [facility]) injection 4 mg  4 mg Intravenous Q6H PRN Rourk, Cristopher Estimable, MD      ? oxyCODONE (Oxy IR/ROXICODONE) immediate release tablet 5 mg  5 mg Oral Q4H PRN Daneil Dolin, MD      ? pantoprazole (PROTONIX) injection 40 mg  40 mg Intravenous Q12H Daneil Dolin, MD   40 mg at 10/24/21 1038  ? predniSONE (DELTASONE) tablet 10 mg  10 mg Oral Daily Johnson, Clanford L, MD   10 mg at 10/24/21 1037  ? Rivaroxaban (XARELTO) tablet 15 mg  15 mg Oral Q supper Johnson, Clanford L, MD   15 mg at 10/23/21 1715  ? sucralfate (CARAFATE) 1 GM/10ML suspension 1 g  1 g Oral TID WC & HS Rourk, Cristopher Estimable, MD   1 g at 10/24/21 0240  ? vitamin B-12 (CYANOCOBALAMIN) tablet 1,000 mcg  1,000 mcg Oral Daily Wynetta Emery, Clanford L, MD   1,000 mcg at 10/24/21 1037  ? ? ? ?Discharge  Medications: ?Please see discharge summary for a list of discharge medications. ? ?Relevant Imaging Results: ? ?Relevant Lab Results: ? ? ?Additional Information ?SSN: [id number] ? ?Archie Endo, LCSW ? ? ? ? ?

## 2021-10-24 NOTE — Progress Notes (Signed)
Per NT patient become confused, complained of feeling short of breath, and didn't think her oxygen was working however the patient has removed her 02, and her nasal cannula was on the floor.  ?While assessing need for 02, patient was asked to stand with walker. She required moderate assistance to stand and assistance while ambulating due to an unsteady gate.  ?Spoke with patient's son, Stacey Davenport, who voiced concerns about his mother living alone due to recent decline. Per the son she gets confused, does not take her medication as directed and has not been eating well. The family is unable to provide 24 hr supervision. ?Case Manager and MD made aware of these concerns, as well as PT recommendation for SNF.  ?

## 2021-10-24 NOTE — Progress Notes (Signed)
CSW spoke with RN regarding the patient's family's desire for SNF placement at discharge. Patient is active with [facility] so facility options are limited to [place], [facility], and [place] - all are located in [place]. CSW will attempt to obtain a bed offer from a facility - [facility] authorization will be required before discharge. ? ?CSW updated MD of information. ? ?Madilyn Fireman, MSW, LCSW ?Transitions of Care  Clinical Social Worker II ?[phone] ? ?

## 2021-10-25 LAB — URINALYSIS, ROUTINE W REFLEX MICROSCOPIC
Bilirubin Urine: NEGATIVE
Glucose, UA: NEGATIVE mg/dL
Ketones, ur: NEGATIVE mg/dL
Nitrite: NEGATIVE
Protein, ur: 100 mg/dL — AB
Specific Gravity, Urine: 1.021 (ref 1.005–1.030)
WBC, UA: >50 WBC/hpf — ABNORMAL HIGH (ref 0–5)
pH: 7 (ref 5.0–8.0)

## 2021-10-25 MED ORDER — ENSURE ENLIVE PO LIQD
237.0000 mL | Freq: Three times a day (TID) | ORAL | Status: DC
  Administered 2021-10-25 (×2): 237 mL via ORAL

## 2021-10-25 NOTE — Progress Notes (Signed)
10/25/2021 ?1:54 PM ? ?Pt was discharged 4/1 and still waiting for SNF placement.  Family reported to me that it is not safe for patient to go home alone.  Family says that patient not taking meds correctly and unsafe home situation, family cannot be with patient 24/7.   I have gotten the social worker involved and requested assistance.  Murvin Natal, MD  ? ?

## 2021-10-26 ENCOUNTER — Encounter (HOSPITAL_COMMUNITY): Payer: Self-pay | Admitting: Internal Medicine

## 2021-10-26 LAB — SURGICAL PATHOLOGY

## 2021-10-26 MED ORDER — PANTOPRAZOLE SODIUM 40 MG PO TBEC: 40.0000 mg | DELAYED_RELEASE_TABLET | Freq: Two times a day (BID) | ORAL | Status: DC

## 2021-10-26 MED ORDER — FOSFOMYCIN TROMETHAMINE 3 G PO PACK
3.0000 g | PACK | Freq: Once | ORAL | Status: AC
  Administered 2021-10-26: 3 g via ORAL
  Filled 2021-10-26: qty 3

## 2021-10-26 NOTE — Progress Notes (Addendum)
Transportation arrived to pick up the patient. This RN was in another patient room when they arrived. Prior to transport arriving, social worker was to call the patients son regarding change in discharge plan. Secretary instructed to ask transportation to wait for clarification on plan. Charge RN spoke with Education officer, museum and coordinated the discharge. No AVS was printed, no report was called. IV was still intact.  ? ?Contacted son to notify him that the patient was transported to [facility].  ?

## 2021-10-26 NOTE — Care Management Important Message (Signed)
Important Message ? ?Patient Details  ?Name: Stacey Davenport ?MRN: [id number] ?Date of Birth: 11-26-1940 ? ? ?Medicare Important Message Given:  Yes ? ? ? ? ?Tommy Medal ?10/26/2021, 12:11 PM ?

## 2021-10-26 NOTE — TOC Progression Note (Signed)
Transition of Care (TOC) - Progression Note  ? ? ?Patient Details  ?Name: Stacey Davenport ?MRN: [id number] ?Date of Birth: [birth year] ? ?Transition of Care (TOC) CM/SW Contact  ?Salome Arnt, LCSW ?Phone Number: ?10/26/2021, 12:06 PM ? ?Clinical Narrative:  RN notified LCSW that [facility] refused to take report. Discussed with admissions at [facility] and they feel pt needs to be SNF, not respite which is all that was approved by [facility]. LCSW called [facility] and left voicemail requesting return call regarding situation. Estill Bamberg with [facility] returned call and states that they had another care plan meeting this morning and have decided that pt will come to day center at [facility] today and they will evaluate pt and then send her home. LCSW asked if pt was not oriented and/or needed SNF, would [facility] arrange for today and was assured that this would be done if needed. LCSW asked if pt's son had been called and Estill Bamberg states that they will talk to family after they assess pt. LCSW requested that they call Shanon Brow prior to pt leaving as the last thing he was told was that pt was going to [facility]. LCSW reiterated that per MD, pt does not have capacity. Estill Bamberg agreed to call Shanon Brow. [facility] arrived to transport pt and was held on floor for [facility] to call after speaking with family. Estill Bamberg reports she was able to talk to David's wife, Elmyra Ricks and explained situation and she would provide the information to the rest of the family. Shanon Brow called this LCSW and indicates he was told by his wife but did not understand why plan had changed. LCSW explained and reassured Shanon Brow that per [facility], if pt needs SNF upon assessment today they will set up. MD and charge RN updated.    ? ? ? ?Expected Discharge Plan: [place] ?Barriers to Discharge: Barriers Resolved ? ?Expected Discharge Plan and Services ?Expected Discharge Plan: [place] ?  ?  ?  ?  ?Expected Discharge Date: 10/24/21               ?  ?  ?  ?  ?  ?HH  Arranged: RN ?  ?  ?  ?  ? ? ?Social Determinants of Health (SDOH) Interventions ?  ? ?Readmission Risk Interventions ?   ? View : No data to display.  ?  ?  ?  ? ? ?

## 2021-10-26 NOTE — Telephone Encounter (Signed)
LMOM for pt to call office back 

## 2021-10-26 NOTE — Progress Notes (Signed)
Called to leave report. Felicia, RN was unable to take report at this time. She was instructed by  Ronata, DON to not take report until the patient had been reviewed. Left call back number.   ? ?Nurse from [facility] called back, they are not accepting the patient at this time. They will ask their admission coordinator to review the patient and then call us back.  ?

## 2021-10-26 NOTE — TOC Transition Note (Signed)
Transition of Care (TOC) - CM/SW Discharge Note ? ? ?Patient Details  ?Name: Stacey Davenport ?MRN: [id number] ?Date of Birth: [birth year] ? ?Transition of Care (TOC) CM/SW Contact:  ?Salome Arnt, LCSW ?Phone Number: ?10/26/2021, 10:20 AM ? ? ?Clinical Narrative:  Pt d/c today. Per [facility], pt has been approved for respite only. She will d/c to [facility] today. Amanda at [facility] states that respite was arranged before admission and they will provide FL2 and any other clinicals needed. LCSW left voicemail for admissions at [facility] regarding d/c. RN given number to call report. LCSW spoke with pt's son, Stacey Davenport. He has concerns about pt discharging home from [place] on Friday as planned. LCSW encouraged him to discuss with [facility].  ? ? ? ?Final next level of care: [place] ?Barriers to Discharge: Barriers Resolved ? ? ?Patient Goals and CMS Choice ?Patient states their goals for this hospitalization and ongoing recovery are:: SNF ?  ?Choice offered to / list presented to : Adult Children ? ?Discharge Placement ?  ?           ?Patient chooses bed at: [facility] ?Patient to be transferred to facility by: [facility] arranging ?Name of family member notified: Stacey Davenport- son ?Patient and family notified of of transfer: 10/26/21 ? ?Discharge Plan and Services ?  ?  ?           ?  ?  ?  ?  ?  ?HH Arranged: RN ?  ?  ?  ?  ? ?Social Determinants of Health (SDOH) Interventions ?  ? ? ?Readmission Risk Interventions ?   ? View : No data to display.  ?  ?  ?  ? ? ? ? ? ?

## 2021-10-26 NOTE — Progress Notes (Addendum)
10/26/2021 ?9:36 AM ? ?Pt was discharged 4/1 and still waiting for SNF placement.  Family reported to me that it is not safe for patient to go home alone.  Family says that patient not taking meds correctly and unsafe home situation, family cannot be with patient 24/7.   I have gotten the social worker involved and requested assistance.  Pt had some dysuria and UTI treated with oral fosfomycin.  TOC still working on SNF placement. Pt remains stable to go to SNF at any time.   Update: son called me and said that [facility] declined SNF.  I spoke with TOC who reported that patient can discharge to [facility] today for approved respite visit.   Murvin Natal, MD  ? ?

## 2021-10-27 ENCOUNTER — Encounter: Payer: Self-pay | Admitting: Internal Medicine

## 2021-10-27 NOTE — Telephone Encounter (Signed)
Per Dr Roseanne Kaufman result letter he wants her to repeat EGD in 1 year. Do I need to add her to our recall list or is she going to [facility]?  ?

## 2021-10-28 NOTE — Telephone Encounter (Signed)
Noted  

## 2021-11-27 ENCOUNTER — Telehealth: Payer: Self-pay

## 2021-11-27 NOTE — Telephone Encounter (Signed)
-----   Message from Ladene Artist, MD sent at 11/27/2021  3:02 PM EDT ----- ?OK for her to continue following with me since this was an inpatient only contact with Dr. Gala Romney. Please enter a recall EGD with me for 09/2022. ? ?----- Message ----- ?From: Lindon Romp, CMA ?Sent: 11/27/2021  11:50 AM EDT ?To: Ladene Artist, MD ? ?Please see phone note on 10/23/21. Patient is scheduled to see you on 12/09/21. Patient only saw Dr. Gala Romney in hospital and was referred back to [place] from there office. Wanted to make sure this was ok since she was seen emergently in hospital. ? ? ?

## 2021-11-30 DIAGNOSIS — M7989 Other specified soft tissue disorders: Secondary | ICD-10-CM | POA: Insufficient documentation

## 2021-11-30 NOTE — Progress Notes (Deleted)
Cardiology Office Note   Date:  11/25/8411   ID:  Chanteria, Haggard [birth year], MRN [id number]  PCP:  Janifer Adie, MD  Cardiologist:   Minus Breeding, MD   No chief complaint on file.      History of Present Illness: Stacey Davenport is a 81 y.o. female who presents for followup of atrial fib.  This has been paroxysmal.   Since I last saw her she was in the hospital with GI bleeding.  She had gastric polyps that were clipped.  Xarelto was held but restarted.  I reviewed these records for this visit.  She did have an elevated BNP.  She was diuresed about one liter.  It looks like Lasix was stopped on discharge.   ***     Since I last saw her she is had no new acute complaints.  She still lives in [place] in an apartment.  She has some home health help about 3 times per week.  She gets around with a walker in her home.  She has her feet on the ground quite a bit and she has had some lower extremity edema though she does not really notice this.  She is not having any new chest pressure, neck or arm discomfort.  She is not noticing any palpitations, presyncope or syncope.  She is seen at [facility]  I do see that they have changed her from warfarin to Eliquis.  She said she could not tolerate the Eliquis so she is most recently been on Xarelto.   Past Medical History:  Diagnosis Date   Anxiety    Asthma    Atrial fibrillation (HCC)    CVA (cerebral infarction)    a. 09/2011 MRI tiny bilat centrum semiovale acute non hemorrhagic infarcts   Esophageal stricture    Gastric polyp    Hyperplastic   Gastritis    GERD (gastroesophageal reflux disease)    Glaucoma    Hiatal hernia    Hyperlipidemia    Hypertension    Hypothyroidism    Osteoarthritis    Pneumonia    Rheumatoid arthritis(714.0)    Thyroid disease     Past Surgical History:  Procedure Laterality Date   APPENDECTOMY     BIOPSY  10/22/2021   Procedure: BIOPSY;  Surgeon: Daneil Dolin, MD;  [facility];  Service: Endoscopy;;  gastric biopsy for H. pylori   BLEPHAROPLASTY Right    CATARACT EXTRACTION Bilateral    CHOLECYSTECTOMY     ESOPHAGOGASTRODUODENOSCOPY (EGD) WITH PROPOFOL N/A 10/22/2021   Procedure: ESOPHAGOGASTRODUODENOSCOPY (EGD) WITH PROPOFOL;  Surgeon: Daneil Dolin, MD;  [facility];  Service: Endoscopy;  Laterality: N/A;   HEMOSTASIS CLIP PLACEMENT  10/22/2021   Procedure: HEMOSTASIS CLIP PLACEMENT;  Surgeon: Daneil Dolin, MD;  [facility];  Service: Endoscopy;;   POLYPECTOMY  10/22/2021   Procedure: POLYPECTOMY;  Surgeon: Daneil Dolin, MD;  [facility];  Service: Endoscopy;;  gastric    TUBAL LIGATION       Current Outpatient Medications  Medication Sig Dispense Refill   amiodarone (PACERONE) 200 MG tablet Take 1 tablet (200 mg total) by mouth daily. 30 tablet 11   ARTIFICIAL TEAR SOLUTION OP Apply 1 drop to eye 4 (four) times daily as needed (dry eye).     brimonidine (ALPHAGAN P) 0.1 % SOLN Place 1 drop into the left eye in the morning, at noon, and at bedtime.  budesonide-formoterol (SYMBICORT) 160-4.5 MCG/ACT inhaler Inhale 2 puffs into the lungs 2 (two) times daily.     cholecalciferol (VITAMIN D) 1000 UNITS tablet Take 1,000 Units by mouth daily.      CRANBERRY PO Take 1 tablet by mouth daily as needed (bladder irritation).     cyanocobalamin 1000 MCG tablet Take 1,000 mcg by mouth daily.     diltiazem (TIAZAC) 180 MG 24 hr capsule Take 1 capsule (180 mg total) by mouth 2 (two) times daily. Take one tablet by mouth in the evening (Patient taking differently: Take 180 mg by mouth 2 (two) times daily.) 60 capsule 11   ferrous sulfate 325 (65 FE) MG tablet Take 325 mg by mouth daily with breakfast.     Lactobacillus-Inulin (CULTURELLE DIGESTIVE DAILY PO) Take 1 capsule by mouth daily. Daily probiotic for IBS     latanoprost (XALATAN) 0.005 % ophthalmic solution PLACE 1 DROP IN [facility] EYE AT BEDTIME (Patient taking  differently: Place 1 drop into both eyes at bedtime.) 2.5 mL 0   levalbuterol (XOPENEX HFA) 45 MCG/ACT inhaler Inhale 2 puffs into the lungs 3 (three) times daily.     levalbuterol (XOPENEX) 0.63 MG/3ML nebulizer solution Take 0.63 mg by nebulization every 8 (eight) hours as needed for wheezing or shortness of breath.     levothyroxine (SYNTHROID, LEVOTHROID) 75 MCG tablet Take 75 mcg by mouth daily before breakfast. Take once daily     lidocaine-prilocaine (EMLA) cream Apply 1 application. topically as needed (prior to podiatry treatment).     loperamide (IMODIUM) 2 MG capsule Take 2 mg by mouth every 4 (four) hours as needed for diarrhea or loose stools.     loratadine (CLARITIN) 10 MG tablet Take 10 mg by mouth daily.     LORazepam (ATIVAN) 0.5 MG tablet Take 2-3 tablets (1-1.5 mg total) by mouth daily. 1 1/2 tab daily, may take additional 1/2 pill if needed     Menthol, Topical Analgesic, (BIOFREEZE PROFESSIONAL EX) Apply 1 application. topically every 4 (four) hours as needed (joint pain).     Olopatadine HCl 0.2 % SOLN Apply 1 drop to eye daily as needed (in affected eye for allergy).     Ophthalmic Irrigation Solution (OCUSOFT EYE [place] OP) Apply 1 application. to eye daily. Applied topically to eyelids once daily with warm water     pantoprazole (PROTONIX) 40 MG tablet Take 1 tablet (40 mg total) by mouth 2 (two) times daily. 60 tablet 2   predniSONE (DELTASONE) 5 MG tablet Take 10 mg by mouth daily.     rivaroxaban (XARELTO) 20 MG TABS tablet Take 20 mg by mouth daily with supper.     Simethicone (GAS-X PO) Take 180 mg by mouth 2 (two) times daily as needed (gas pain).     traMADol (ULTRAM) 50 MG tablet Take 25-50 mg by mouth 2 (two) times daily as needed for moderate pain (back pain).     valsartan (DIOVAN) 40 MG tablet Take 40 mg by mouth daily.     White Petrolatum-Mineral Oil (GENTEAL TEARS NIGHT-TIME OP) Apply 1 application. to eye every evening.     No current facility-administered  medications for this visit.    Allergies:   Sulfamethoxazole, Aciphex [rabeprazole sodium], Amoxicillin, Biaxin [clarithromycin], Cephalexin, Esomeprazole magnesium, Hydrocodone, Hydrocodone-acetaminophen, Ketek [telithromycin], Latex, Levaquin [levofloxacin in d5w], Lipitor [atorvastatin], Macrobid [nitrofurantoin macrocrystal], Moxifloxacin, Nexium [esomeprazole magnesium], Rabeprazole sodium, Sulfonamide derivatives, Vesicare [solifenacin], and Zithromax [azithromycin]   ROS:  Please see the history of present illness.  Otherwise, review of systems are positive for ***.   All other systems are reviewed and negative.    PHYSICAL EXAM: VS:  There were no vitals taken for this visit. , BMI There is no height or weight on file to calculate BMI. GENERAL:  Well appearing NECK:  No jugular venous distention, waveform within normal limits, carotid upstroke brisk and symmetric, no bruits, no thyromegaly LUNGS:  Clear to auscultation bilaterally CHEST:  Unremarkable HEART:  PMI not displaced or sustained,S1 and S2 within normal limits, no S3, no S4, no clicks, no rubs, *** murmurs ABD:  Flat, positive bowel sounds normal in frequency in pitch, no bruits, no rebound, no guarding, no midline pulsatile mass, no hepatomegaly, no splenomegaly EXT:  2 plus pulses throughout, no edema, no cyanosis no clubbing     ***GEN:  No distress NECK:  No jugular venous distention at 90 degrees, waveform within normal limits, carotid upstroke brisk and symmetric, no bruits, no thyromegaly LYMPHATICS:  No cervical adenopathy LUNGS:  Clear to auscultation bilaterally BACK:  No CVA tenderness CHEST:  Unremarkable HEART:  S1 and S2 within normal limits, no S3, no S4, no clicks, no rubs, 3 of 6 apical systolic murmur radiating slightly at the aortic outflow tract, no diastolic murmurs ABD:  Positive bowel sounds normal in frequency in pitch, no bruits, no rebound, no guarding, unable to assess midline mass or bruit  with the patient seated. EXT:  2 plus pulses throughout, severe calf and pretibial edema, no cyanosis no clubbing SKIN:  No rashes no nodules NEURO:  Cranial nerves II through XII grossly intact, motor grossly intact throughout PSYCH:  Cognitively intact, oriented to person place and time   EKG:  EKG is  *** ordered today. The ekg ordered today demonstrates sinus rhythm, rate ***, axis within normal limits, intervals within normal limits, poor anterior R wave progression.  Significant baseline artifact   Recent Labs: 06/11/2021: TSH 1.920 10/21/2021: B Natriuretic Peptide 407.0 10/22/2021: ALT 17; BUN 14; Creatinine, Ser 0.60; Magnesium 1.9; Potassium 3.8; Sodium 140 10/24/2021: Hemoglobin 8.0; Platelets 195    Lipid Panel    Component Value Date/Time   CHOL 175 10/16/2013 1142   CHOL 198 12/06/2012 1040   TRIG 81 10/16/2013 1142   TRIG 94 12/06/2012 1040   HDL 69 10/16/2013 1142   HDL 88 12/06/2012 1040   CHOLHDL 1.8 09/30/2011 1610   VLDL 16 09/30/2011 1610   LDLCALC 90 10/16/2013 1142   LDLCALC 91 12/06/2012 1040      Wt Readings from Last 3 Encounters:  10/22/21 139 lb 15.9 oz (63.5 kg)  06/11/21 140 lb (63.5 kg)  04/27/21 134 lb 15.8 oz (61.2 kg)      Other studies Reviewed: Additional studies/ records that were reviewed today include: ***. Review of the above records demonstrates:  Please see elsewhere in the note.     ASSESSMENT AND PLAN:  ATRIAL FIB:    ***    She was quite symptomatic with atrial fibrillation in 2013.  I think it is prudent to continue her amiodarone.   I do not see the most recent TSH or liver enzymes and I will order this today.  She continues on her anticoagulation.   HTN:    Her blood pressure is ***  elevated.  I would request that she get her blood pressures checked by her aides at home and we could increase her Cozaar if in fact this is not just whitecoat hypertension.  She has had whitecoat  hypertension in the past.   LEG SWELLING:    ***  Her leg swelling is significant today and I am going to give her 3 days of 20 mg of Lasix and have her keep her feet elevated.  Current medicines are reviewed at length with the patient today.  The patient does not have concerns regarding medicines.  The following changes have been made: As above  Labs/ tests ordered today include: None  No orders of the defined types were placed in this encounter.     Disposition:   FU with me 6 months in [place], Minus Breeding, MD  11/30/2021 4:59 PM    [facility]

## 2021-12-02 ENCOUNTER — Ambulatory Visit: Payer: Medicare (Managed Care) | Admitting: Cardiology

## 2021-12-02 DIAGNOSIS — I480 Paroxysmal atrial fibrillation: Secondary | ICD-10-CM

## 2021-12-02 DIAGNOSIS — I10 Essential (primary) hypertension: Secondary | ICD-10-CM

## 2021-12-02 DIAGNOSIS — M7989 Other specified soft tissue disorders: Secondary | ICD-10-CM

## 2021-12-09 ENCOUNTER — Ambulatory Visit: Payer: Medicare (Managed Care) | Admitting: Gastroenterology

## 2021-12-23 ENCOUNTER — Ambulatory Visit: Payer: Medicare (Managed Care) | Admitting: Cardiology

## 2022-01-17 NOTE — Progress Notes (Signed)
Cardiology Office Note   Date:  01/19/2022   ID:  Pavani, Pennoyer 1941/05/06, MRN [id number]  PCP:  Jethro Bastos, MD  Cardiologist:   Rollene Rotunda, MD   Chief Complaint  Patient presents with   Atrial Fibrillation       History of Present Illness: Stacey Davenport is a 81 y.o. female who presents for followup of atrial fib.  This has been paroxysmal.  Since I last saw her she was in the hospital with anemia secondary to bleeding gastric polyps.  These were removed.  Stacey Davenport was held but restarted at discharge.  I reviewed these records for this visit.     She is at [facility] rehab.  It sounds like she did some physical therapy.  She sounds like she does a little bit of this now but she is getting around mostly in a wheelchair.  She has decreased memory but it sounds like she is fairly good with the details.  She denies any ongoing palpitations, presyncope or syncope.  She is not having any chest pressure, neck or arm discomfort.  She has had no weight gain.  She has some very mild lower extremity edema.  I do note that from a review of her hospital records that she was sent home off of diuretics.   Past Medical History:  Diagnosis Date   Anxiety    Asthma    Atrial fibrillation (HCC)    CVA (cerebral infarction)    a. 09/2011 MRI tiny bilat centrum semiovale acute non hemorrhagic infarcts   Esophageal stricture    Gastric polyp    Hyperplastic   Gastritis    GERD (gastroesophageal reflux disease)    Glaucoma    Hiatal hernia    Hyperlipidemia    Hypertension    Hypothyroidism    Osteoarthritis    Pneumonia    Rheumatoid arthritis(714.0)    Thyroid disease     Past Surgical History:  Procedure Laterality Date   APPENDECTOMY     BIOPSY  10/22/2021   Procedure: BIOPSY;  Surgeon: Corbin Ade, MD;  [facility];  Service: Endoscopy;;  gastric biopsy for H. pylori   BLEPHAROPLASTY Right    CATARACT EXTRACTION Bilateral    CHOLECYSTECTOMY      ESOPHAGOGASTRODUODENOSCOPY (EGD) WITH PROPOFOL N/A 10/22/2021   Procedure: ESOPHAGOGASTRODUODENOSCOPY (EGD) WITH PROPOFOL;  Surgeon: Corbin Ade, MD;  [facility];  Service: Endoscopy;  Laterality: N/A;   HEMOSTASIS CLIP PLACEMENT  10/22/2021   Procedure: HEMOSTASIS CLIP PLACEMENT;  Surgeon: Corbin Ade, MD;  [facility];  Service: Endoscopy;;   POLYPECTOMY  10/22/2021   Procedure: POLYPECTOMY;  Surgeon: Corbin Ade, MD;  [facility];  Service: Endoscopy;;  gastric    TUBAL LIGATION       Current Outpatient Medications  Medication Sig Dispense Refill   amiodarone (PACERONE) 200 MG tablet Take 1 tablet (200 mg total) by mouth daily. 30 tablet 11   ARTIFICIAL TEAR SOLUTION OP Apply 1 drop to eye 4 (four) times daily as needed (dry eye).     brimonidine (ALPHAGAN P) 0.1 % SOLN Place 1 drop into the left eye in the morning, at noon, and at bedtime.     budesonide-formoterol (SYMBICORT) 160-4.5 MCG/ACT inhaler Inhale 2 puffs into the lungs 2 (two) times daily.     cholecalciferol (VITAMIN D) 1000 UNITS tablet Take 1,000 Units by mouth daily.      Cholecalciferol (VITAMIN  D-1000 MAX ST) 25 MCG (1000 UT) tablet Take by mouth.     CRANBERRY PO Take 1 tablet by mouth daily as needed (bladder irritation).     cyanocobalamin 1000 MCG tablet Take 1,000 mcg by mouth daily.     diltiazem (TIAZAC) 180 MG 24 hr capsule Take 1 capsule (180 mg total) by mouth 2 (two) times daily. Take one tablet by mouth in the evening (Patient taking differently: Take 180 mg by mouth 2 (two) times daily.) 60 capsule 11   ferrous sulfate 325 (65 FE) MG tablet Take 325 mg by mouth daily with breakfast.     Lactobacillus-Inulin (CULTURELLE DIGESTIVE DAILY PO) Take 1 capsule by mouth daily. Daily probiotic for IBS     latanoprost (XALATAN) 0.005 % ophthalmic solution PLACE 1 DROP IN [employer] EYE AT BEDTIME (Patient taking differently: Place 1 drop into both eyes at bedtime.) 2.5 mL 0    levalbuterol (XOPENEX HFA) 45 MCG/ACT inhaler Inhale 2 puffs into the lungs 3 (three) times daily.     levalbuterol (XOPENEX) 0.63 MG/3ML nebulizer solution Take 0.63 mg by nebulization every 8 (eight) hours as needed for wheezing or shortness of breath.     levothyroxine (SYNTHROID, LEVOTHROID) 75 MCG tablet Take 75 mcg by mouth daily before breakfast. Take once daily     lidocaine-prilocaine (EMLA) cream Apply 1 application. topically as needed (prior to podiatry treatment).     loperamide (IMODIUM) 2 MG capsule Take 2 mg by mouth every 4 (four) hours as needed for diarrhea or loose stools.     loratadine (CLARITIN) 10 MG tablet Take 10 mg by mouth daily.     LORazepam (ATIVAN) 0.5 MG tablet Take 2-3 tablets (1-1.5 mg total) by mouth daily. 1 1/2 tab daily, may take additional 1/2 pill if needed     Menthol, Topical Analgesic, (BIOFREEZE PROFESSIONAL EX) Apply 1 application. topically every 4 (four) hours as needed (joint pain).     Olopatadine HCl 0.2 % SOLN Apply 1 drop to eye daily as needed (in affected eye for allergy).     Ophthalmic Irrigation Solution (OCUSOFT EYE WASH OP) Apply 1 application. to eye daily. Applied topically to eyelids once daily with warm water     predniSONE (DELTASONE) 5 MG tablet Take 10 mg by mouth daily.     rivaroxaban (XARELTO) 20 MG TABS tablet Take 20 mg by mouth daily with supper.     Saccharomyces boulardii (PROBIOTIC) 250 MG CAPS Take 250 mg by mouth daily. For IBS     Simethicone (GAS-X PO) Take 180 mg by mouth 2 (two) times daily as needed (gas pain).     traMADol (ULTRAM) 50 MG tablet Take 25-50 mg by mouth 2 (two) times daily as needed for moderate pain (back pain).     valsartan (DIOVAN) 40 MG tablet Take 20 mg by mouth daily.     White Petrolatum-Mineral Oil (GENTEAL TEARS NIGHT-TIME OP) Apply 1 application. to eye every evening.     pantoprazole (PROTONIX) 40 MG tablet Take 1 tablet (40 mg total) by mouth 2 (two) times daily. (Patient not taking:  Reported on 01/19/2022) 60 tablet 2   No current facility-administered medications for this visit.    Allergies:   Sulfamethoxazole, Aciphex [rabeprazole sodium], Amoxicillin, Biaxin [clarithromycin], Cephalexin, Esomeprazole magnesium, Hydrocodone, Hydrocodone-acetaminophen, Ketek [telithromycin], Latex, Levaquin [levofloxacin in d5w], Lipitor [atorvastatin], Macrobid [nitrofurantoin macrocrystal], Moxifloxacin, Nexium [esomeprazole magnesium], Rabeprazole sodium, Sulfonamide derivatives, Vesicare [solifenacin], and Zithromax [azithromycin]   ROS:  Please see the history of present  illness.   Otherwise, review of systems are positive for decreased memory.   All other systems are reviewed and negative.    PHYSICAL EXAM: VS:  BP 137/79   Pulse 65   Ht 5\' 3"  (1.6 m)   Wt 138 lb (62.6 kg)   SpO2 96%   BMI 24.45 kg/m  , BMI Body mass index is 24.45 kg/m. GEN:  No distress, frail appearing NECK:  No jugular venous distention at 90 degrees, waveform within normal limits, carotid upstroke brisk and symmetric, no bruits, no thyromegaly LYMPHATICS:  No cervical adenopathy LUNGS:  Clear to auscultation bilaterally BACK:  No CVA tenderness CHEST:  Unremarkable HEART:  S1 and S2 within normal limits, no S3, no S4, no clicks, no rubs, 3 of 6 apical systolic murmur radiating slightly at the aortic outflow tract, no diastolic murmurs ABD:  Positive bowel sounds normal in frequency in pitch, no bruits, no rebound, no guarding, unable to assess midline mass or bruit with the patient seated. EXT:  2 plus pulses throughout, mild bilateral lower extremity edema, no cyanosis no clubbing SKIN:  No rashes no nodules NEURO:  Cranial nerves II through XII grossly intact, motor grossly intact throughout PSYCH:  Cognitively intact, oriented to person place and time  EKG:  EKG is  ordered today. The ekg ordered today demonstrates sinus rhythm, rate 65, axis within normal limits, intervals within normal limits,  poor anterior R wave progression.  Significant baseline artifact.  She does have an unusual P wave axis which might indicate lead placement although her QRS axis is not different than previous.   Recent Labs: 06/11/2021: TSH 1.920 10/21/2021: B Natriuretic Peptide 407.0 10/22/2021: ALT 17; BUN 14; Creatinine, Ser 0.60; Magnesium 1.9; Potassium 3.8; Sodium 140 10/24/2021: Hemoglobin 8.0; Platelets 195    Lipid Panel    Component Value Date/Time   CHOL 175 10/16/2013 1142   CHOL 198 12/06/2012 1040   TRIG 81 10/16/2013 1142   TRIG 94 12/06/2012 1040   HDL 69 10/16/2013 1142   HDL 88 12/06/2012 1040   CHOLHDL 1.8 09/30/2011 1610   VLDL 16 09/30/2011 1610   LDLCALC 90 10/16/2013 1142   LDLCALC 91 12/06/2012 1040     Lab Results  Component Value Date   TSH 1.920 06/11/2021   ALT 17 10/22/2021   AST 19 10/22/2021   ALKPHOS 48 10/22/2021   BILITOT 0.6 10/22/2021   PROT 5.4 (L) 10/22/2021   ALBUMIN 3.0 (L) 10/22/2021     Wt Readings from Last 3 Encounters:  01/19/22 138 lb (62.6 kg)  10/22/21 139 lb 15.9 oz (63.5 kg)  06/11/21 140 lb (63.5 kg)      Other studies Reviewed: Additional studies/ records that were reviewed today include: **. Review of the above records demonstrates:  Please see elsewhere in the note.     ASSESSMENT AND PLAN:  ATRIAL FIB:    She is not having any symptomatic atrial fibrillation.  She is up-to-date with her blood work I did check this.  She seems to be tolerating anticoagulation although I will check a CBC as 1 has not been done since her discharge.  HTN:    It does look like her Valsartan has been reduced. Her blood pressure is at target.  No change in therapy.  LEG SWELLING:   This seems to be mild off the diuretic.  No change in therapy.    QT: QT is mildly prolonged.  I do not think we need to discontinue any medications  but I would avoid any other QT prolonging drugs.   Current medicines are reviewed at length with the patient today.   The patient does not have concerns regarding medicines.  The following changes have been made: None  Labs/ tests ordered today include: None  Orders Placed This Encounter  Procedures   CBC   EKG 12-Lead    Disposition:   FU with me in one year.    Signed, Rollene Rotunda, MD  01/19/2022 4:03 PM    [facility]

## 2022-01-19 ENCOUNTER — Encounter: Payer: Self-pay | Admitting: Cardiology

## 2022-01-19 ENCOUNTER — Ambulatory Visit (INDEPENDENT_AMBULATORY_CARE_PROVIDER_SITE_OTHER): Payer: Medicare (Managed Care) | Admitting: Cardiology

## 2022-01-19 VITALS — BP 137/79 | HR 65 | Ht 63.0 in | Wt 138.0 lb

## 2022-01-19 DIAGNOSIS — E785 Hyperlipidemia, unspecified: Secondary | ICD-10-CM | POA: Diagnosis not present

## 2022-01-19 DIAGNOSIS — I10 Essential (primary) hypertension: Secondary | ICD-10-CM

## 2022-01-19 DIAGNOSIS — R002 Palpitations: Secondary | ICD-10-CM | POA: Diagnosis not present

## 2022-01-19 DIAGNOSIS — I480 Paroxysmal atrial fibrillation: Secondary | ICD-10-CM | POA: Diagnosis not present

## 2022-01-19 LAB — CBC
Hematocrit: 43.1 % (ref 34.0–46.6)
Hemoglobin: 13.4 g/dL (ref 11.1–15.9)
MCH: 26.1 pg — ABNORMAL LOW (ref 26.6–33.0)
MCHC: 31.1 g/dL — ABNORMAL LOW (ref 31.5–35.7)
MCV: 84 fL (ref 79–97)
Platelets: 219 10*3/uL (ref 150–450)
RBC: 5.13 x10E6/uL (ref 3.77–5.28)
RDW: 18.1 % — ABNORMAL HIGH (ref 11.7–15.4)
WBC: 8.3 10*3/uL (ref 3.4–10.8)

## 2022-01-20 ENCOUNTER — Encounter: Payer: Self-pay | Admitting: *Deleted

## 2022-02-02 ENCOUNTER — Ambulatory Visit (INDEPENDENT_AMBULATORY_CARE_PROVIDER_SITE_OTHER): Payer: Medicare (Managed Care) | Admitting: Gastroenterology

## 2022-02-02 ENCOUNTER — Encounter: Payer: Self-pay | Admitting: Gastroenterology

## 2022-02-02 VITALS — BP 130/64 | HR 67 | Ht 63.0 in | Wt 137.0 lb

## 2022-02-02 DIAGNOSIS — Z7901 Long term (current) use of anticoagulants: Secondary | ICD-10-CM

## 2022-02-02 DIAGNOSIS — K317 Polyp of stomach and duodenum: Secondary | ICD-10-CM

## 2022-02-02 NOTE — Progress Notes (Signed)
    Assessment     Bleeding hyperplastic and adenomatous gastric polyps removed in March IDA, corrected History of GERD with an esophageal stricture   Recommendations    PCP to monitor anemia Continue pantoprazole 40 mg po qd GI follow up prn   HPI    This is an 81 year old female hospitalized at [facility] in March for progressively worsening IDA with intermittent black stools, FOBT + stool.  She has a history of A-fib and a stroke and is maintained on Xarelto.  EGD performed as below with 5 oozing gastric polyps removed.  She is a resident at [facility] and returns for follow-up today.  Her anemia has completely corrected, recent Hgb=13.4.  She complains of mild constipation and denies hematochezia, melena, dark stools, hematemesis.  - Normal esophagus. - Large hiatal hernia. Multiple hemorrhagic gastric polyps. 5 oozing polyps were removed; clips placed. Multiple, smaller innocent appearing polyps remain. - Normal duodenal bulb and second portion of the duodenum. -Patient has been bleeding from her stomach in the setting of anticoagulation.  Polyp path: tubular adenoma, hyperplastic  Labs / Imaging       Latest Ref Rng & Units 10/22/2021   11:21 AM 10/21/2021   11:56 PM 06/11/2021   12:53 PM  Hepatic Function  Total Protein 6.5 - 8.1 g/dL 5.4  5.9  6.3   Albumin 3.5 - 5.0 g/dL 3.0  3.2  4.1   AST 15 - 41 U/L $Remo'19  22  30   'grXjk$ ALT 0 - 44 U/L $Remo'17  18  18   'rWdnM$ Alk Phosphatase 38 - 126 U/L 48  56  87   Total Bilirubin 0.3 - 1.2 mg/dL 0.6  0.1  0.4        Latest Ref Rng & Units 01/19/2022    4:22 PM 10/24/2021    6:37 AM 10/23/2021    4:31 AM  CBC  WBC 3.4 - 10.8 x10E3/uL 8.3  9.2  8.9   Hemoglobin 11.1 - 15.9 g/dL 13.4  8.0  7.9   Hematocrit 34.0 - 46.6 % 43.1  27.5  26.8   Platelets 150 - 450 x10E3/uL 219  195  219     Current Medications, Allergies, Past Medical History, Past Surgical History, Family History and Social History were reviewed in [employer]  record.   Physical Exam: General: Well developed, well nourished, elderly, no acute distress, in a wheelchair Head: Normocephalic and atraumatic Eyes: Sclerae anicteric, EOMI Ears: Normal auditory acuity Mouth: Not examined Lungs: Clear throughout to auscultation Heart: Regular rate and rhythm; no murmurs, rubs or bruits Abdomen: Soft, non tender and non distended. No masses, hepatosplenomegaly or hernias noted. Normal Bowel sounds Rectal: Not done Musculoskeletal: Symmetrical with no gross deformities  Pulses:  Normal pulses noted Extremities: No clubbing, cyanosis, edema or deformities noted Neurological: Alert oriented x 4, grossly nonfocal Psychological:  Alert and cooperative. Normal mood and affect   Myiah Petkus T. Fuller Plan, MD 02/02/2022, 1:41 PM

## 2022-02-02 NOTE — Patient Instructions (Signed)
Follow up with [place] as needed.   Follow up with your primary care physician.   The [facility] providers would like to encourage you to use [facility] to communicate with providers for non-urgent requests or questions.  Due to long hold times on the telephone, sending your provider a message by [facility] may be a faster and more efficient way to get a response.  Please allow 48 business hours for a response.  Please remember that this is for non-urgent requests.   Thank you for choosing me and [facility].  Pricilla Riffle. Dagoberto Ligas., MD., Marval Regal

## 2022-03-23 ENCOUNTER — Other Ambulatory Visit: Payer: Self-pay

## 2022-03-23 ENCOUNTER — Encounter (HOSPITAL_COMMUNITY): Payer: Self-pay | Admitting: *Deleted

## 2022-03-23 ENCOUNTER — Inpatient Hospital Stay (HOSPITAL_COMMUNITY)
Admission: EM | Admit: 2022-03-23 | Discharge: 2022-04-05 | DRG: 177 | Disposition: A | Discharge status: Skilled Nursing Facility | Payer: Medicare (Managed Care) | Source: Skilled Nursing Facility | Attending: Internal Medicine | Admitting: Internal Medicine

## 2022-03-23 ENCOUNTER — Emergency Department (HOSPITAL_COMMUNITY): Payer: Medicare (Managed Care)

## 2022-03-23 DIAGNOSIS — U071 COVID-19: Principal | ICD-10-CM | POA: Diagnosis present

## 2022-03-23 DIAGNOSIS — J45909 Unspecified asthma, uncomplicated: Secondary | ICD-10-CM | POA: Diagnosis present

## 2022-03-23 DIAGNOSIS — Z7951 Long term (current) use of inhaled steroids: Secondary | ICD-10-CM

## 2022-03-23 DIAGNOSIS — I480 Paroxysmal atrial fibrillation: Secondary | ICD-10-CM | POA: Diagnosis not present

## 2022-03-23 DIAGNOSIS — Z8673 Personal history of transient ischemic attack (TIA), and cerebral infarction without residual deficits: Secondary | ICD-10-CM

## 2022-03-23 DIAGNOSIS — I5032 Chronic diastolic (congestive) heart failure: Secondary | ICD-10-CM | POA: Diagnosis present

## 2022-03-23 DIAGNOSIS — Z7952 Long term (current) use of systemic steroids: Secondary | ICD-10-CM

## 2022-03-23 DIAGNOSIS — T380X5A Adverse effect of glucocorticoids and synthetic analogues, initial encounter: Secondary | ICD-10-CM | POA: Diagnosis present

## 2022-03-23 DIAGNOSIS — Z833 Family history of diabetes mellitus: Secondary | ICD-10-CM

## 2022-03-23 DIAGNOSIS — Z886 Allergy status to analgesic agent status: Secondary | ICD-10-CM

## 2022-03-23 DIAGNOSIS — Z8249 Family history of ischemic heart disease and other diseases of the circulatory system: Secondary | ICD-10-CM

## 2022-03-23 DIAGNOSIS — Z881 Allergy status to other antibiotic agents status: Secondary | ICD-10-CM

## 2022-03-23 DIAGNOSIS — K5900 Constipation, unspecified: Secondary | ICD-10-CM | POA: Diagnosis not present

## 2022-03-23 DIAGNOSIS — E039 Hypothyroidism, unspecified: Secondary | ICD-10-CM | POA: Diagnosis present

## 2022-03-23 DIAGNOSIS — Z79899 Other long term (current) drug therapy: Secondary | ICD-10-CM

## 2022-03-23 DIAGNOSIS — Z91040 Latex allergy status: Secondary | ICD-10-CM

## 2022-03-23 DIAGNOSIS — I4891 Unspecified atrial fibrillation: Secondary | ICD-10-CM | POA: Diagnosis present

## 2022-03-23 DIAGNOSIS — Z882 Allergy status to sulfonamides status: Secondary | ICD-10-CM

## 2022-03-23 DIAGNOSIS — E785 Hyperlipidemia, unspecified: Secondary | ICD-10-CM | POA: Diagnosis present

## 2022-03-23 DIAGNOSIS — Z7901 Long term (current) use of anticoagulants: Secondary | ICD-10-CM

## 2022-03-23 DIAGNOSIS — M1990 Unspecified osteoarthritis, unspecified site: Secondary | ICD-10-CM | POA: Diagnosis present

## 2022-03-23 DIAGNOSIS — J1282 Pneumonia due to coronavirus disease 2019: Secondary | ICD-10-CM | POA: Diagnosis present

## 2022-03-23 DIAGNOSIS — Z79890 Hormone replacement therapy (postmenopausal): Secondary | ICD-10-CM

## 2022-03-23 DIAGNOSIS — I110 Hypertensive heart disease with heart failure: Secondary | ICD-10-CM | POA: Diagnosis present

## 2022-03-23 DIAGNOSIS — D6959 Other secondary thrombocytopenia: Secondary | ICD-10-CM | POA: Diagnosis present

## 2022-03-23 DIAGNOSIS — F0390 Unspecified dementia without behavioral disturbance: Secondary | ICD-10-CM | POA: Diagnosis present

## 2022-03-23 DIAGNOSIS — R112 Nausea with vomiting, unspecified: Secondary | ICD-10-CM | POA: Diagnosis present

## 2022-03-23 DIAGNOSIS — Z66 Do not resuscitate: Secondary | ICD-10-CM | POA: Diagnosis present

## 2022-03-23 DIAGNOSIS — M069 Rheumatoid arthritis, unspecified: Secondary | ICD-10-CM | POA: Diagnosis present

## 2022-03-23 DIAGNOSIS — Z880 Allergy status to penicillin: Secondary | ICD-10-CM

## 2022-03-23 DIAGNOSIS — Z809 Family history of malignant neoplasm, unspecified: Secondary | ICD-10-CM

## 2022-03-23 DIAGNOSIS — J690 Pneumonitis due to inhalation of food and vomit: Secondary | ICD-10-CM | POA: Diagnosis present

## 2022-03-23 DIAGNOSIS — Z888 Allergy status to other drugs, medicaments and biological substances status: Secondary | ICD-10-CM

## 2022-03-23 DIAGNOSIS — K219 Gastro-esophageal reflux disease without esophagitis: Secondary | ICD-10-CM | POA: Diagnosis present

## 2022-03-23 DIAGNOSIS — J9601 Acute respiratory failure with hypoxia: Secondary | ICD-10-CM | POA: Diagnosis present

## 2022-03-23 DIAGNOSIS — I10 Essential (primary) hypertension: Secondary | ICD-10-CM | POA: Diagnosis present

## 2022-03-23 LAB — CBC WITH DIFFERENTIAL/PLATELET
Abs Immature Granulocytes: 0.04 10*3/uL (ref 0.00–0.07)
Basophils Absolute: 0 10*3/uL (ref 0.0–0.1)
Basophils Relative: 0 %
Eosinophils Absolute: 0 10*3/uL (ref 0.0–0.5)
Eosinophils Relative: 0 %
HCT: 42.9 % (ref 36.0–46.0)
Hemoglobin: 14.1 g/dL (ref 12.0–15.0)
Immature Granulocytes: 1 %
Lymphocytes Relative: 6 %
Lymphs Abs: 0.5 10*3/uL — ABNORMAL LOW (ref 0.7–4.0)
MCH: 30 pg (ref 26.0–34.0)
MCHC: 32.9 g/dL (ref 30.0–36.0)
MCV: 91.3 fL (ref 80.0–100.0)
Monocytes Absolute: 0.7 10*3/uL (ref 0.1–1.0)
Monocytes Relative: 8 %
Neutro Abs: 7.4 10*3/uL (ref 1.7–7.7)
Neutrophils Relative %: 85 %
Platelets: 118 10*3/uL — ABNORMAL LOW (ref 150–400)
RBC: 4.7 MIL/uL (ref 3.87–5.11)
RDW: 16.4 % — ABNORMAL HIGH (ref 11.5–15.5)
WBC: 8.6 10*3/uL (ref 4.0–10.5)
nRBC: 0 % (ref 0.0–0.2)

## 2022-03-23 LAB — BRAIN NATRIURETIC PEPTIDE: B Natriuretic Peptide: 194.4 pg/mL — ABNORMAL HIGH (ref 0.0–100.0)

## 2022-03-23 LAB — SARS CORONAVIRUS 2 BY RT PCR: SARS Coronavirus 2 by RT PCR: POSITIVE — AB

## 2022-03-23 LAB — LACTIC ACID, PLASMA: Lactic Acid, Venous: 1.2 mmol/L (ref 0.5–1.9)

## 2022-03-23 LAB — TROPONIN I (HIGH SENSITIVITY): Troponin I (High Sensitivity): 24 ng/L — ABNORMAL HIGH (ref <18)

## 2022-03-23 MED ORDER — DEXAMETHASONE SODIUM PHOSPHATE 10 MG/ML IJ SOLN
10.0000 mg | Freq: Once | INTRAMUSCULAR | Status: AC
  Administered 2022-03-23: 10 mg via INTRAVENOUS
  Filled 2022-03-23: qty 1

## 2022-03-23 MED ORDER — ACETAMINOPHEN 500 MG PO TABS
1000.0000 mg | ORAL_TABLET | Freq: Once | ORAL | Status: AC
  Administered 2022-03-23: 1000 mg via ORAL
  Filled 2022-03-23: qty 2

## 2022-03-23 MED ORDER — ONDANSETRON HCL 4 MG/2ML IJ SOLN
4.0000 mg | Freq: Once | INTRAMUSCULAR | Status: AC
  Administered 2022-03-24: 4 mg via INTRAVENOUS
  Filled 2022-03-23: qty 2

## 2022-03-23 MED ORDER — SODIUM CHLORIDE 0.9 % IV SOLN
100.0000 mg | Freq: Every day | INTRAVENOUS | Status: AC
  Administered 2022-03-24 – 2022-03-25 (×2): 100 mg via INTRAVENOUS
  Filled 2022-03-23 (×2): qty 20

## 2022-03-23 MED ORDER — SODIUM CHLORIDE 0.9 % IV SOLN
200.0000 mg | Freq: Once | INTRAVENOUS | Status: AC
  Administered 2022-03-24: 200 mg via INTRAVENOUS
  Filled 2022-03-23: qty 40

## 2022-03-23 MED ORDER — DOXYCYCLINE HYCLATE 100 MG PO TABS: 100.0000 mg | ORAL_TABLET | Freq: Once | ORAL | Status: DC

## 2022-03-23 NOTE — ED Triage Notes (Signed)
Pt arrived with [facility] from [facility] for SOB and fever. Pt tested covid + today. Pt is not usually on oxygen, placed on 2L by facility.Initially oxygen sats 88% on 2L, improved to 98% on NRB. Pt reports not feeling well for a few days, fever 102.9, and productive cough

## 2022-03-23 NOTE — ED Notes (Signed)
POA and son Shanon Brow called for an update, and was told nurse will give a call back later [phone]

## 2022-03-23 NOTE — Progress Notes (Signed)
Placed pt. On humidified high flow nasal cannula per MD.

## 2022-03-23 NOTE — ED Provider Notes (Signed)
[facility] Provider Note   CSN: [id number] Arrival date & time: 03/23/22  2132     History {Add pertinent medical, surgical, social history, OB history to HPI:1} Chief Complaint  Patient presents with   Shortness of [place] is a 81 y.o. female.  HPI     Tested positive late last Wednesday Has been sick for one week 2-3 days of symptoms, Sunday afternoon seemed short of breath, was still talking. Tonight said dhse was too short of breath,weak to talk, difficulty holding self up in wheelchair. Throwing up, not keeping things down Was on oxygen but not getting good reading    With antibiotics will complain at times of itching, at diarrhea  Past Medical History:  Diagnosis Date   Anxiety    Asthma    Atrial fibrillation (HCC)    CVA (cerebral infarction)    a. 09/2011 MRI tiny bilat centrum semiovale acute non hemorrhagic infarcts   Dementia (HCC)    mild per patient   Esophageal stricture    Gastric polyp    Hyperplastic   Gastritis    GERD (gastroesophageal reflux disease)    Glaucoma    Hiatal hernia    Hyperlipidemia    Hypertension    Hypothyroidism    Osteoarthritis    Pneumonia    Rheumatoid arthritis(714.0)    Thyroid disease      Home Medications Prior to Admission medications   Medication Sig Start Date End Date Taking? Authorizing Provider  amiodarone (PACERONE) 200 MG tablet Take 1 tablet (200 mg total) by mouth daily. 03/07/13   Minus Breeding, MD  ARTIFICIAL TEAR SOLUTION OP Apply 1 drop to eye 4 (four) times daily as needed (dry eye).    [provider]  brimonidine (ALPHAGAN P) 0.1 % SOLN Place 1 drop into the left eye in the morning, at noon, and at bedtime.    [provider]  budesonide-formoterol (SYMBICORT) 160-4.5 MCG/ACT inhaler Inhale 2 puffs into the lungs 2 (two) times daily.    [provider]  cholecalciferol (VITAMIN D) 1000 UNITS tablet Take 1,000  Units by mouth daily.     [provider]  Cholecalciferol (VITAMIN D-1000 MAX ST) 25 MCG (1000 UT) tablet Take by mouth.    [provider]  CRANBERRY PO Take 1 tablet by mouth daily as needed (bladder irritation).    [provider]  cyanocobalamin 1000 MCG tablet Take 1,000 mcg by mouth daily.    [provider]  diltiazem (TIAZAC) 180 MG 24 hr capsule Take 1 capsule (180 mg total) by mouth 2 (two) times daily. Take one tablet by mouth in the evening Patient taking differently: Take 180 mg by mouth 2 (two) times daily. 03/17/16   Minus Breeding, MD  ferrous sulfate 325 (65 FE) MG tablet Take 325 mg by mouth daily with breakfast.    [provider]  Lactobacillus-Inulin (CULTURELLE DIGESTIVE DAILY PO) Take 1 capsule by mouth daily. Daily probiotic for IBS    [provider]  latanoprost (XALATAN) 0.005 % ophthalmic solution PLACE 1 DROP IN [facility] EYE AT BEDTIME Patient taking differently: Place 1 drop into both eyes at bedtime. 06/11/15   Chipper Herb, MD  levalbuterol [facility] HFA) 45 MCG/ACT inhaler Inhale 2 puffs into the lungs 3 (three) times daily. 10/24/21   Johnson, Clanford L, MD  levalbuterol (XOPENEX) 0.63 MG/3ML nebulizer solution Take 0.63 mg by nebulization every 8 (eight) hours as needed  for wheezing or shortness of breath.    [provider]  levothyroxine (SYNTHROID, LEVOTHROID) 75 MCG tablet Take 75 mcg by mouth daily before breakfast. Take once daily 12/06/12   Chipper Herb, MD  lidocaine-prilocaine (EMLA) cream Apply 1 application. topically as needed (prior to podiatry treatment).    [provider]  loperamide (IMODIUM) 2 MG capsule Take 2 mg by mouth every 4 (four) hours as needed for diarrhea or loose stools.    [provider]  loratadine (CLARITIN) 10 MG tablet Take 10 mg by mouth daily.    [provider]  LORazepam (ATIVAN) 0.5 MG tablet Take 2-3 tablets (1-1.5 mg total) by mouth  daily. 1 1/2 tab daily, may take additional 1/2 pill if needed 10/24/21   Murlean Iba, MD  Menthol, Topical Analgesic, (BIOFREEZE PROFESSIONAL EX) Apply 1 application. topically every 4 (four) hours as needed (joint pain).    [provider]  Olopatadine HCl 0.2 % SOLN Apply 1 drop to eye daily as needed (in affected eye for allergy).    [provider]  Ophthalmic Irrigation Solution (OCUSOFT EYE [place] OP) Apply 1 application. to eye daily. Applied topically to eyelids once daily with warm water    [provider]  pantoprazole (PROTONIX) 40 MG tablet Take 1 tablet (40 mg total) by mouth 2 (two) times daily. Patient not taking: Reported on 01/19/2022 10/24/21 01/22/22  Murlean Iba, MD  predniSONE (DELTASONE) 5 MG tablet Take 10 mg by mouth daily.    [provider]  rivaroxaban (XARELTO) 20 MG TABS tablet Take 20 mg by mouth daily with supper.    [provider]  Saccharomyces boulardii (PROBIOTIC) 250 MG CAPS Take 250 mg by mouth daily. For IBS    [provider]  Simethicone (GAS-X PO) Take 180 mg by mouth 2 (two) times daily as needed (gas pain).    [provider]  traMADol (ULTRAM) 50 MG tablet Take 25-50 mg by mouth 2 (two) times daily as needed for moderate pain (back pain).    [provider]  valsartan (DIOVAN) 40 MG tablet Take 20 mg by mouth daily.    [provider]  White Petrolatum-Mineral Oil (GENTEAL TEARS NIGHT-TIME OP) Apply 1 application. to eye every evening.    [provider]  potassium chloride (K-DUR,KLOR-CON) 10 MEQ tablet Take 1 tablet (10 mEq total) by mouth daily. 12/06/12 10/11/13  Chipper Herb, MD      Allergies    Sulfamethoxazole, Aciphex Graciella [place] sodium], Amoxicillin, Biaxin [clarithromycin], Cephalexin, Esomeprazole magnesium, Hydrocodone, Hydrocodone-acetaminophen, Ketek [telithromycin], Latex, Levaquin [levofloxacin in d5w], Lipitor [atorvastatin], Macrobid  [nitrofurantoin macrocrystal], Moxifloxacin, Nexium [esomeprazole magnesium], Rabeprazole sodium, Sulfonamide derivatives, Vesicare [solifenacin], and Zithromax [azithromycin]    Review of Systems   Review of Systems  Physical Exam Updated Vital Signs BP (!) 164/86   Pulse 72   Temp (!) 101.9 F (38.8 C) (Temporal)   Resp (!) 25   SpO2 94%  Physical Exam  ED Results / Procedures / Treatments   Labs (all labs ordered are listed, but only abnormal results are displayed) Labs Reviewed  SARS CORONAVIRUS 2 BY RT PCR - Abnormal; Notable for the following components:      Result Value   SARS Coronavirus 2 by RT PCR POSITIVE (*)    All other components within normal limits  CBC WITH DIFFERENTIAL/PLATELET - Abnormal; Notable for the following components:   RDW 16.4 (*)    Platelets 118 (*)  Lymphs Abs 0.5 (*)    All other components within normal limits  BRAIN NATRIURETIC PEPTIDE - Abnormal; Notable for the following components:   B Natriuretic Peptide 194.4 (*)    All other components within normal limits  TROPONIN I (HIGH SENSITIVITY) - Abnormal; Notable for the following components:   Troponin I (High Sensitivity) 24 (*)    All other components within normal limits  CULTURE, BLOOD (ROUTINE X 2)  CULTURE, BLOOD (ROUTINE X 2)  COMPREHENSIVE METABOLIC PANEL  LACTIC ACID, PLASMA  LACTIC ACID, PLASMA  TROPONIN I (HIGH SENSITIVITY)    EKG None  Radiology DG Chest Portable 1 View  Result Date: 03/23/2022 CLINICAL DATA:  Shortness of breath and fevers, history of COVID-19 positivity EXAM: PORTABLE CHEST 1 VIEW COMPARISON:  10/21/2021 FINDINGS: Cardiac shadow is enlarged but stable. Aortic calcifications are seen. Increased central vascular congestion is noted consistent with underlying CHF. Patchy areas of airspace opacity are noted particularly in the right lung which may represent edema although focal infiltrate could not be totally excluded. Bony structures are within normal  limits. IMPRESSION: Changes of mild CHF. Airspace opacities in the right lung which may be related to edema or infectious etiology. Electronically Signed   By: Inez Catalina M.D.   On: 03/23/2022 22:47    Procedures Procedures  {Document cardiac monitor, telemetry assessment procedure when appropriate:1}  Medications Ordered in ED Medications  remdesivir 200 mg in sodium chloride 0.9% 250 mL IVPB (has no administration in time range)    Followed by  remdesivir 100 mg in sodium chloride 0.9 % 100 mL IVPB (has no administration in time range)  dexamethasone (DECADRON) injection 10 mg (10 mg Intravenous Given 03/23/22 2242)  acetaminophen (TYLENOL) tablet 1,000 mg (1,000 mg Oral Given 03/23/22 2242)    ED Course/ Medical Decision Making/ A&P                           Medical Decision Making Amount and/or Complexity of Data Reviewed Labs: ordered. Radiology: ordered.  Risk OTC drugs. Prescription drug management.   ***  {Document critical care time when appropriate:1} {Document review of labs and clinical decision tools ie heart score, Chads2Vasc2 etc:1}  {Document your independent review of radiology images, and any outside records:1} {Document your discussion with family members, caretakers, and with consultants:1} {Document social determinants of health affecting pt's care:1} {Document your decision making why or why not admission, treatments were needed:1} Final Clinical Impression(s) / ED Diagnoses Final diagnoses:  None    Rx / DC Orders ED Discharge Orders     None

## 2022-03-24 ENCOUNTER — Encounter (HOSPITAL_COMMUNITY): Payer: Self-pay | Admitting: Family Medicine

## 2022-03-24 DIAGNOSIS — I10 Essential (primary) hypertension: Secondary | ICD-10-CM | POA: Diagnosis not present

## 2022-03-24 DIAGNOSIS — K219 Gastro-esophageal reflux disease without esophagitis: Secondary | ICD-10-CM | POA: Diagnosis present

## 2022-03-24 DIAGNOSIS — J1282 Pneumonia due to coronavirus disease 2019: Secondary | ICD-10-CM | POA: Diagnosis present

## 2022-03-24 DIAGNOSIS — Z8673 Personal history of transient ischemic attack (TIA), and cerebral infarction without residual deficits: Secondary | ICD-10-CM | POA: Diagnosis not present

## 2022-03-24 DIAGNOSIS — Z79899 Other long term (current) drug therapy: Secondary | ICD-10-CM | POA: Diagnosis not present

## 2022-03-24 DIAGNOSIS — K5900 Constipation, unspecified: Secondary | ICD-10-CM | POA: Diagnosis not present

## 2022-03-24 DIAGNOSIS — T380X5A Adverse effect of glucocorticoids and synthetic analogues, initial encounter: Secondary | ICD-10-CM | POA: Diagnosis present

## 2022-03-24 DIAGNOSIS — F0390 Unspecified dementia without behavioral disturbance: Secondary | ICD-10-CM | POA: Diagnosis present

## 2022-03-24 DIAGNOSIS — I480 Paroxysmal atrial fibrillation: Secondary | ICD-10-CM | POA: Diagnosis present

## 2022-03-24 DIAGNOSIS — I5032 Chronic diastolic (congestive) heart failure: Secondary | ICD-10-CM | POA: Diagnosis present

## 2022-03-24 DIAGNOSIS — J690 Pneumonitis due to inhalation of food and vomit: Secondary | ICD-10-CM | POA: Diagnosis present

## 2022-03-24 DIAGNOSIS — Z7951 Long term (current) use of inhaled steroids: Secondary | ICD-10-CM | POA: Diagnosis not present

## 2022-03-24 DIAGNOSIS — M069 Rheumatoid arthritis, unspecified: Secondary | ICD-10-CM | POA: Diagnosis present

## 2022-03-24 DIAGNOSIS — I110 Hypertensive heart disease with heart failure: Secondary | ICD-10-CM | POA: Diagnosis present

## 2022-03-24 DIAGNOSIS — J9601 Acute respiratory failure with hypoxia: Secondary | ICD-10-CM | POA: Diagnosis present

## 2022-03-24 DIAGNOSIS — R112 Nausea with vomiting, unspecified: Secondary | ICD-10-CM | POA: Diagnosis present

## 2022-03-24 DIAGNOSIS — Z515 Encounter for palliative care: Secondary | ICD-10-CM | POA: Diagnosis not present

## 2022-03-24 DIAGNOSIS — U071 COVID-19: Secondary | ICD-10-CM | POA: Diagnosis present

## 2022-03-24 DIAGNOSIS — Z7189 Other specified counseling: Secondary | ICD-10-CM | POA: Diagnosis not present

## 2022-03-24 DIAGNOSIS — M1990 Unspecified osteoarthritis, unspecified site: Secondary | ICD-10-CM | POA: Diagnosis present

## 2022-03-24 DIAGNOSIS — Z79890 Hormone replacement therapy (postmenopausal): Secondary | ICD-10-CM | POA: Diagnosis not present

## 2022-03-24 DIAGNOSIS — E039 Hypothyroidism, unspecified: Secondary | ICD-10-CM | POA: Diagnosis present

## 2022-03-24 DIAGNOSIS — Z66 Do not resuscitate: Secondary | ICD-10-CM | POA: Diagnosis present

## 2022-03-24 DIAGNOSIS — D6959 Other secondary thrombocytopenia: Secondary | ICD-10-CM | POA: Diagnosis present

## 2022-03-24 DIAGNOSIS — E785 Hyperlipidemia, unspecified: Secondary | ICD-10-CM | POA: Diagnosis present

## 2022-03-24 DIAGNOSIS — J45909 Unspecified asthma, uncomplicated: Secondary | ICD-10-CM | POA: Diagnosis present

## 2022-03-24 DIAGNOSIS — Z7901 Long term (current) use of anticoagulants: Secondary | ICD-10-CM | POA: Diagnosis not present

## 2022-03-24 LAB — COMPREHENSIVE METABOLIC PANEL
ALT: 31 U/L (ref 0–44)
AST: 30 U/L (ref 15–41)
Albumin: 3 g/dL — ABNORMAL LOW (ref 3.5–5.0)
Alkaline Phosphatase: 45 U/L (ref 38–126)
Anion gap: 9 (ref 5–15)
BUN: 14 mg/dL (ref 8–23)
CO2: 22 mmol/L (ref 22–32)
Calcium: 8.3 mg/dL — ABNORMAL LOW (ref 8.9–10.3)
Chloride: 105 mmol/L (ref 98–111)
Creatinine, Ser: 0.82 mg/dL (ref 0.44–1.00)
GFR, Estimated: >60 mL/min (ref >60)
Glucose, Bld: 93 mg/dL (ref 70–99)
Potassium: 4.1 mmol/L (ref 3.5–5.1)
Sodium: 136 mmol/L (ref 135–145)
Total Bilirubin: 0.7 mg/dL (ref 0.3–1.2)
Total Protein: 5.9 g/dL — ABNORMAL LOW (ref 6.5–8.1)

## 2022-03-24 LAB — MAGNESIUM: Magnesium: 1.9 mg/dL (ref 1.7–2.4)

## 2022-03-24 LAB — PHOSPHORUS: Phosphorus: 3.3 mg/dL (ref 2.5–4.6)

## 2022-03-24 LAB — D-DIMER, QUANTITATIVE: D-Dimer, Quant: 1.33 ug/mL-FEU — ABNORMAL HIGH (ref 0.00–0.50)

## 2022-03-24 LAB — PROCALCITONIN: Procalcitonin: 1.24 ng/mL

## 2022-03-24 LAB — TROPONIN I (HIGH SENSITIVITY): Troponin I (High Sensitivity): 30 ng/L — ABNORMAL HIGH (ref <18)

## 2022-03-24 LAB — C-REACTIVE PROTEIN: CRP: 32.7 mg/dL — ABNORMAL HIGH (ref <1.0)

## 2022-03-24 LAB — LACTIC ACID, PLASMA: Lactic Acid, Venous: 1.1 mmol/L (ref 0.5–1.9)

## 2022-03-24 MED ORDER — ACETAMINOPHEN 325 MG PO TABS
650.0000 mg | ORAL_TABLET | Freq: Four times a day (QID) | ORAL | Status: DC | PRN
Start: 2022-03-24 — End: 2022-04-05
  Administered 2022-03-24 – 2022-04-03 (×17): 650 mg via ORAL
  Filled 2022-03-24 (×17): qty 2

## 2022-03-24 MED ORDER — LEVALBUTEROL HCL 0.63 MG/3ML IN NEBU
0.6300 mg | INHALATION_SOLUTION | Freq: Four times a day (QID) | RESPIRATORY_TRACT | Status: DC | PRN
  Administered 2022-03-28: 0.63 mg via RESPIRATORY_TRACT
  Filled 2022-03-24 (×2): qty 3

## 2022-03-24 MED ORDER — DILTIAZEM HCL ER COATED BEADS 180 MG PO CP24
180.0000 mg | ORAL_CAPSULE | Freq: Two times a day (BID) | ORAL | Status: DC
Start: 2022-03-24 — End: 2022-03-29
  Administered 2022-03-24 – 2022-03-29 (×11): 180 mg via ORAL
  Filled 2022-03-24 (×12): qty 1

## 2022-03-24 MED ORDER — PANTOPRAZOLE SODIUM 40 MG PO TBEC
40.0000 mg | DELAYED_RELEASE_TABLET | Freq: Two times a day (BID) | ORAL | Status: DC
  Administered 2022-03-24 – 2022-04-05 (×25): 40 mg via ORAL
  Filled 2022-03-24 (×25): qty 1

## 2022-03-24 MED ORDER — DOXYCYCLINE HYCLATE 100 MG IV SOLR: 100.0000 mg | Freq: Two times a day (BID) | INTRAVENOUS | Status: DC

## 2022-03-24 MED ORDER — SENNOSIDES-DOCUSATE SODIUM 8.6-50 MG PO TABS
1.0000 | ORAL_TABLET | Freq: Every evening | ORAL | Status: DC | PRN
  Administered 2022-03-25: 1 via ORAL
  Filled 2022-03-24 (×2): qty 1

## 2022-03-24 MED ORDER — GUAIFENESIN-DM 100-10 MG/5ML PO SYRP: 10.0000 mL | ORAL_SOLUTION | ORAL | Status: DC | PRN

## 2022-03-24 MED ORDER — AMIODARONE HCL 200 MG PO TABS
200.0000 mg | ORAL_TABLET | Freq: Every day | ORAL | Status: DC
  Administered 2022-03-24 – 2022-04-05 (×13): 200 mg via ORAL
  Filled 2022-03-24 (×13): qty 1

## 2022-03-24 MED ORDER — FUROSEMIDE 10 MG/ML IJ SOLN
40.0000 mg | Freq: Every day | INTRAMUSCULAR | Status: DC
  Administered 2022-03-24 – 2022-03-31 (×8): 40 mg via INTRAVENOUS
  Filled 2022-03-24 (×8): qty 4

## 2022-03-24 MED ORDER — LEVOTHYROXINE SODIUM 75 MCG PO TABS
75.0000 ug | ORAL_TABLET | Freq: Every day | ORAL | Status: DC
  Administered 2022-03-24 – 2022-04-05 (×13): 75 ug via ORAL
  Filled 2022-03-24 (×13): qty 1

## 2022-03-24 MED ORDER — RIVAROXABAN 20 MG PO TABS
20.0000 mg | ORAL_TABLET | Freq: Every day | ORAL | Status: DC
  Administered 2022-03-24 – 2022-04-04 (×12): 20 mg via ORAL
  Filled 2022-03-24 (×7): qty 1
  Filled 2022-03-24: qty 2
  Filled 2022-03-24 (×4): qty 1

## 2022-03-24 MED ORDER — MOMETASONE FURO-FORMOTEROL FUM 200-5 MCG/ACT IN AERO
2.0000 | INHALATION_SPRAY | Freq: Two times a day (BID) | RESPIRATORY_TRACT | Status: DC
  Filled 2022-03-24: qty 8.8

## 2022-03-24 MED ORDER — IRBESARTAN 75 MG PO TABS
37.5000 mg | ORAL_TABLET | Freq: Every day | ORAL | Status: DC
  Administered 2022-03-24 – 2022-04-04 (×12): 37.5 mg via ORAL
  Filled 2022-03-24 (×13): qty 0.5

## 2022-03-24 MED ORDER — ARFORMOTEROL TARTRATE 15 MCG/2ML IN NEBU
15.0000 ug | INHALATION_SOLUTION | Freq: Two times a day (BID) | RESPIRATORY_TRACT | Status: DC
  Administered 2022-03-24 – 2022-04-05 (×23): 15 ug via RESPIRATORY_TRACT
  Filled 2022-03-24 (×26): qty 2

## 2022-03-24 MED ORDER — BUDESONIDE 0.25 MG/2ML IN SUSP
0.2500 mg | Freq: Two times a day (BID) | RESPIRATORY_TRACT | Status: DC
  Administered 2022-03-24 – 2022-04-05 (×24): 0.25 mg via RESPIRATORY_TRACT
  Filled 2022-03-24 (×27): qty 2

## 2022-03-24 MED ORDER — PREDNISONE 20 MG PO TABS
50.0000 mg | ORAL_TABLET | Freq: Every day | ORAL | Status: DC
Start: 2022-03-27 — End: 2022-03-28
  Administered 2022-03-27 – 2022-03-28 (×2): 50 mg via ORAL
  Filled 2022-03-24 (×2): qty 3

## 2022-03-24 MED ORDER — LORAZEPAM 0.5 MG PO TABS
0.5000 mg | ORAL_TABLET | Freq: Every evening | ORAL | Status: DC | PRN
  Administered 2022-03-24 – 2022-04-04 (×9): 0.5 mg via ORAL
  Filled 2022-03-24 (×9): qty 1

## 2022-03-24 MED ORDER — REVEFENACIN 175 MCG/3ML IN SOLN
175.0000 ug | Freq: Every day | RESPIRATORY_TRACT | Status: DC
  Administered 2022-03-24 – 2022-04-05 (×13): 175 ug via RESPIRATORY_TRACT
  Filled 2022-03-24 (×15): qty 3

## 2022-03-24 MED ORDER — SODIUM CHLORIDE 0.9% FLUSH
3.0000 mL | Freq: Two times a day (BID) | INTRAVENOUS | Status: DC
  Administered 2022-03-24 – 2022-04-05 (×22): 3 mL via INTRAVENOUS

## 2022-03-24 MED ORDER — GUAIFENESIN ER 600 MG PO TB12
1200.0000 mg | ORAL_TABLET | Freq: Two times a day (BID) | ORAL | Status: DC
  Administered 2022-03-24 – 2022-04-05 (×25): 1200 mg via ORAL
  Filled 2022-03-24 (×25): qty 2

## 2022-03-24 MED ORDER — METHYLPREDNISOLONE SODIUM SUCC 125 MG IJ SOLR
60.0000 mg | Freq: Two times a day (BID) | INTRAMUSCULAR | Status: AC
  Administered 2022-03-24 – 2022-03-26 (×6): 60 mg via INTRAVENOUS
  Filled 2022-03-24 (×6): qty 2

## 2022-03-24 NOTE — H&P (Addendum)
History and Physical    MICKY OVERTURF ZOX:[id number] DOB: 02-21-1941 DOA: 03/23/2022  PCP: Janifer Adie, MD   Patient coming from: SNF   Chief Complaint: Lethargy, SOB, fever, hypoxia   HPI: URA Stacey Davenport is a pleasant 81 y.o. female with medical history significant for steroid-dependent asthma, hypertension, hyperlipidemia, history of CVA, dementia, and PAF on Xarelto, now presenting to the emergency department with fevers, hypoxia, and fatigue.  Patient reportedly complained of feeling sick beginning close to a week ago, tested positive for COVID-19 on 03/17/2022, and has since developed worsening shortness of breath, fatigue, and now hypoxia.  ED Course: Upon arrival to the ED, patient is found to be febrile to 38.8 C and saturating 88% on 2 L/min of supplemental oxygen with mild tachypnea and stable blood pressure.  ED work-up notable for platelets 118,000, troponin 24, BNP 894, positive COVID PCR, and airspace opacities on chest x-ray.  Blood cultures were collected in the ED and the patient was treated with acetaminophen, remdesivir, and Decadron.  Review of Systems:  Unable to complete ROS secondary to patient's clinical condition.  Past Medical History:  Diagnosis Date   Anxiety    Asthma    Atrial fibrillation (HCC)    CVA (cerebral infarction)    a. 09/2011 MRI tiny bilat centrum semiovale acute non hemorrhagic infarcts   Dementia (HCC)    mild per patient   Esophageal stricture    Gastric polyp    Hyperplastic   Gastritis    GERD (gastroesophageal reflux disease)    Glaucoma    Hiatal hernia    Hyperlipidemia    Hypertension    Hypothyroidism    Osteoarthritis    Pneumonia    Rheumatoid arthritis(714.0)    Thyroid disease     Past Surgical History:  Procedure Laterality Date   APPENDECTOMY     BIOPSY  10/22/2021   Procedure: BIOPSY;  Surgeon: Daneil Dolin, MD;  Location: [facility];  Service: Endoscopy;;  gastric biopsy for H. pylori    BLEPHAROPLASTY Right    CATARACT EXTRACTION Bilateral    CHOLECYSTECTOMY     ESOPHAGOGASTRODUODENOSCOPY (EGD) WITH PROPOFOL N/A 10/22/2021   Procedure: ESOPHAGOGASTRODUODENOSCOPY (EGD) WITH PROPOFOL;  Surgeon: Daneil Dolin, MD;  Location: [facility];  Service: Endoscopy;  Laterality: N/A;   HEMOSTASIS CLIP PLACEMENT  10/22/2021   Procedure: HEMOSTASIS CLIP PLACEMENT;  Surgeon: Daneil Dolin, MD;  Location: [facility];  Service: Endoscopy;;   POLYPECTOMY  10/22/2021   Procedure: POLYPECTOMY;  Surgeon: Daneil Dolin, MD;  Location: [facility];  Service: Endoscopy;;  gastric    TUBAL LIGATION      Social History:   reports that she has never smoked. She has never used smokeless tobacco. She reports that she does not drink alcohol and does not use drugs.  Allergies  Allergen Reactions   Sulfamethoxazole Nausea And Vomiting    Bladder infection NOT ON MAR   Aciphex [Rabeprazole Sodium]     Unknown reaction   Amoxicillin     Unknown reaction    Biaxin [Clarithromycin] Hives and Itching   Cephalexin     Unknown reaction   Hydrocodone     NOT ON MAR    Ketek [Telithromycin]     NOT ON MAR   Latex     Unknown reaction    Levaquin [Levofloxacin In D5w]     Unknown reaction    Lipitor [Atorvastatin]     Unknown reaction  Macrobid [Nitrofurantoin Macrocrystal]     NOT ON MAR   Moxifloxacin     NOT ON MAR   Nexium [Esomeprazole Magnesium]    Sulfonamide Derivatives     NOT ON MAR    Vesicare [Solifenacin]     NOT ON MAR   Zithromax [Azithromycin]     NOT ON MAR     Family History  Problem Relation Age of Onset   Diabetes Mother    Cancer Father        lymph nodes???   Diabetes Sister    Heart disease Sister    Other Son        he was shot   Colon cancer Neg Hx    Esophageal cancer Neg Hx      Prior to Admission medications   Medication Sig Start Date End Date Taking? Authorizing Provider  acetaminophen (TYLENOL) 650 MG CR tablet Take 650 mg  by mouth every 8 (eight) hours. May also take 1 tablet by mouth every 4 hours as needed for pain   Yes [provider]  amiodarone (PACERONE) 200 MG tablet Take 1 tablet (200 mg total) by mouth daily. 03/07/13  Yes Minus Breeding, MD  brimonidine (ALPHAGAN P) 0.1 % SOLN Place 1 drop into the left eye in the morning, at noon, and at bedtime.   Yes [provider]  budesonide-formoterol (SYMBICORT) 160-4.5 MCG/ACT inhaler Inhale 2 puffs into the lungs 2 (two) times daily.   Yes [provider]  Cholecalciferol (VITAMIN D) 50 MCG (2000 UT) CAPS Take 2,000 Units by mouth daily.   Yes [provider]  CRANBERRY PO Take 425 mg by mouth daily as needed (bladder irritation).   Yes [provider]  cyanocobalamin 1000 MCG tablet Take 1,000 mcg by mouth daily.   Yes [provider]  diltiazem (TIAZAC) 180 MG 24 hr capsule Take 1 capsule (180 mg total) by mouth 2 (two) times daily. Take one tablet by mouth in the evening Patient taking differently: Take 180 mg by mouth 2 (two) times daily. 03/17/16  Yes Minus Breeding, MD  Ensure (ENSURE) Take 237 mLs by mouth 2 (two) times daily between meals.   Yes [provider]  ferrous sulfate 325 (65 FE) MG tablet Take 325 mg by mouth daily with breakfast.   Yes [provider]  hydrocortisone cream 1 % Apply 1 Application topically 2 (two) times daily. Apply 1 application to both hands twice a day until healed   Yes [provider]  latanoprost (XALATAN) 0.005 % ophthalmic solution PLACE 1 DROP IN [facility] EYE AT BEDTIME Patient taking differently: Place 1 drop into both eyes at bedtime. 06/11/15  Yes Chipper Herb, MD  levalbuterol [facility] HFA) 45 MCG/ACT inhaler Inhale 2 puffs into the lungs 3 (three) times daily. 10/24/21  Yes Johnson, Clanford L, MD  levothyroxine (SYNTHROID, LEVOTHROID) 75 MCG tablet Take 75 mcg by mouth daily before breakfast. Take once daily 12/06/12  Yes Chipper Herb,  MD  loratadine (CLARITIN) 10 MG tablet Take 10 mg by mouth daily.   Yes [provider]  LORazepam (ATIVAN) 0.5 MG tablet Take 2-3 tablets (1-1.5 mg total) by mouth daily. 1 1/2 tab daily, may take additional 1/2 pill if needed Patient taking differently: Take 0.5 mg by mouth at bedtime. May also give 1 tablet by mouth twice a day as needed for anxiety 10/24/21  Yes Johnson, Clanford L, MD  Menthol, Topical Analgesic, (BIOFREEZE PROFESSIONAL EX) Apply 1 application. topically  every 4 (four) hours as needed (joint pain).   Yes [provider]  molnupiravir EUA (LAGEVRIO) 200 MG CAPS capsule Take 4 capsules by mouth 2 (two) times daily. for 5 days   Yes [provider]  Olopatadine HCl 0.2 % SOLN Place 1 drop into both eyes daily.   Yes [provider]  Ophthalmic Irrigation Solution (OCUSOFT EYE [place] OP) Apply 1 application. to eye daily. Applied topically to eyelids once daily with warm water   Yes [provider]  pantoprazole (PROTONIX) 40 MG tablet Take 1 tablet (40 mg total) by mouth 2 (two) times daily. Patient taking differently: Take 40 mg by mouth daily. 10/24/21 03/25/23 Yes Johnson, Clanford L, MD  Polyethyl Glycol-Propyl Glycol (GENTEAL TEARS SEVERE DAY/NIGHT) 0.4-0.3 % GEL ophthalmic gel Place 1 Application into both eyes at bedtime.   Yes [provider]  polyethylene glycol (MIRALAX / GLYCOLAX) 17 g packet Take 17 g by mouth daily.   Yes [provider]  polyvinyl alcohol (LIQUIFILM TEARS) 1.4 % ophthalmic solution Place 1 drop into both eyes 4 (four) times daily as needed for dry eyes.   Yes [provider]  predniSONE (DELTASONE) 5 MG tablet Take 10 mg by mouth daily. for arthritis and asthma   Yes [provider]  rivaroxaban (XARELTO) 20 MG TABS tablet Take 20 mg by mouth at bedtime.   Yes [provider]  Saccharomyces boulardii (PROBIOTIC) 250 MG CAPS Take 250 mg by mouth daily. For IBS   Yes  [provider]  senna (SENOKOT) 8.6 MG TABS tablet Take 2 tablets by mouth at bedtime.   Yes [provider]  sodium fluoride (PREVIDENT 5000 PLUS) 1.1 % CREA dental cream Place 1 Application onto teeth at bedtime.   Yes [provider]  traMADol (ULTRAM) 50 MG tablet Take 25 mg by mouth 2 (two) times daily as needed for moderate pain (back pain).   Yes [provider]  valsartan (DIOVAN) 40 MG tablet Take 20 mg by mouth daily.   Yes [provider]  levalbuterol (XOPENEX) 0.63 MG/3ML nebulizer solution Take 0.63 mg by nebulization every 8 (eight) hours as needed for wheezing or shortness of breath. Patient not taking: Reported on 03/24/2022    [provider]  lidocaine-prilocaine (EMLA) cream Apply 1 application. topically as needed (prior to podiatry treatment). Patient not taking: Reported on 03/24/2022    [provider]  loperamide (IMODIUM) 2 MG capsule Take 2 mg by mouth every 4 (four) hours as needed for diarrhea or loose stools. Patient not taking: Reported on 03/24/2022    [provider]  Simethicone (GAS-X PO) Take 180 mg by mouth 2 (two) times daily as needed (gas pain). Patient not taking: Reported on 03/24/2022    [provider]  potassium chloride (K-DUR,KLOR-CON) 10 MEQ tablet Take 1 tablet (10 mEq total) by mouth daily. 12/06/12 10/11/13  Chipper Herb, MD    Physical Exam: Vitals:   03/23/22 2354 03/24/22 0056 03/24/22 0115 03/24/22 0245  BP:   (!) 135/58 (!) 124/58  Pulse: 69  (!) 56 (!) 51  Resp: (!) 23  (!) 21 17  Temp:  98.9 F (37.2 C)    TempSrc:  Axillary    SpO2: 95%  92% 94%    Constitutional: NAD, calm  Eyes: PERTLA, lids and conjunctivae normal ENMT: Mucous membranes are moist. Posterior pharynx clear of any exudate or lesions.   Neck: supple, no masses  Respiratory: fine rales b/l,  no wheezing. No accessory muscle use.  Cardiovascular: S1 & S2 heard, regular rate and rhythm.   No significant JVD. Abdomen: No distension, no tenderness, soft. Bowel sounds active.  Musculoskeletal: no clubbing / cyanosis. No joint deformity upper and lower extremities.   Skin: no significant rashes, lesions, ulcers. Warm, dry, well-perfused. Neurologic: CN 2-12 grossly intact. Moving all extremities. Sleeping, wakes to voice and makes eye-contact briefly before returning to sleep.  Psychiatric: Calm. Cooperative.    Labs and Imaging on Admission: I have personally reviewed following labs and imaging studies  CBC: Recent Labs  Lab 03/23/22 2203  WBC 8.6  NEUTROABS 7.4  HGB 14.1  HCT 42.9  MCV 91.3  PLT 094*   Basic Metabolic Panel: Recent Labs  Lab 03/23/22 2203 03/24/22 0200  NA 136  --   K 4.1  --   CL 105  --   CO2 22  --   GLUCOSE 93  --   BUN 14  --   CREATININE 0.82  --   CALCIUM 8.3*  --   MG  --  1.9  PHOS  --  3.3   GFR: CrCl cannot be calculated (Unknown ideal weight.). Liver Function Tests: Recent Labs  Lab 03/23/22 2203  AST 30  ALT 31  ALKPHOS 45  BILITOT 0.7  PROT 5.9*  ALBUMIN 3.0*   No results for input(s): "LIPASE", "AMYLASE" in the last 168 hours. No results for input(s): "AMMONIA" in the last 168 hours. Coagulation Profile: No results for input(s): "INR", "PROTIME" in the last 168 hours. Cardiac Enzymes: No results for input(s): "CKTOTAL", "CKMB", "CKMBINDEX", "TROPONINI" in the last 168 hours. BNP (last 3 results) No results for input(s): "PROBNP" in the last 8760 hours. HbA1C: No results for input(s): "HGBA1C" in the last 72 hours. CBG: No results for input(s): "GLUCAP" in the last 168 hours. Lipid Profile: No results for input(s): "CHOL", "HDL", "LDLCALC", "TRIG", "CHOLHDL", "LDLDIRECT" in the last 72 hours. Thyroid Function Tests: No results for input(s): "TSH", "T4TOTAL", "FREET4", "T3FREE", "THYROIDAB" in the last 72 hours. Anemia Panel: No results for input(s): "VITAMINB12", "FOLATE", "FERRITIN", "TIBC", "IRON",  "RETICCTPCT" in the last 72 hours. Urine analysis:    Component Value Date/Time   COLORURINE AMBER (A) 10/25/2021 1737   APPEARANCEUR TURBID (A) 10/25/2021 1737   LABSPEC 1.021 10/25/2021 1737   PHURINE 7.0 10/25/2021 1737   GLUCOSEU NEGATIVE 10/25/2021 1737   HGBUR SMALL (A) 10/25/2021 1737   BILIRUBINUR NEGATIVE 10/25/2021 1737   KETONESUR NEGATIVE 10/25/2021 1737   PROTEINUR 100 (A) 10/25/2021 1737   UROBILINOGEN 0.2 04/20/2012 1605   NITRITE NEGATIVE 10/25/2021 1737   LEUKOCYTESUR LARGE (A) 10/25/2021 1737   Sepsis Labs: '@LABRCNTIP'$ (procalcitonin:4,lacticidven:4) ) Recent Results (from the past 240 hour(s))  SARS Coronavirus 2 by RT PCR (hospital order, performed in [place] hospital lab) *cepheid single result test* Anterior Nasal Swab     Status: Abnormal   Collection Time: 03/23/22  9:56 PM   Specimen: Anterior Nasal Swab  Result Value Ref Range Status   SARS Coronavirus 2 by RT PCR POSITIVE (A) NEGATIVE Final    Comment: (NOTE) SARS-CoV-2 target nucleic acids are DETECTED  SARS-CoV-2 RNA is generally detectable in upper respiratory specimens  during the acute phase of infection.  Positive results are indicative  of the presence of the identified virus, but do not rule out bacterial infection or co-infection with other pathogens not detected by the test.  Clinical correlation with patient history and  other diagnostic information is necessary to  determine patient infection status.  The expected result is negative.  Fact Sheet for Patients:   https://www.patel.info/   Fact Sheet for Healthcare Providers:   https://hall.com/    This test is not yet approved or cleared by the [place] FDA and  has been authorized for detection and/or diagnosis of SARS-CoV-2 by FDA under an Emergency Use Authorization (EUA).  This EUA will remain in effect (meaning this test can be used) for the duration of  the COVID-19 declaration  under Section 564(b)(1)  of the Act, 21 U.S.C. section 360-bbb-3(b)(1), unless the authorization is terminated or revoked sooner.   Performed at [facility], [place] [address] [place], [place] [address]      Radiological Exams on Admission: DG Chest Portable 1 View  Result Date: 03/23/2022 CLINICAL DATA:  Shortness of breath and fevers, history of COVID-19 positivity EXAM: PORTABLE CHEST 1 VIEW COMPARISON:  10/21/2021 FINDINGS: Cardiac shadow is enlarged but stable. Aortic calcifications are seen. Increased central vascular congestion is noted consistent with underlying CHF. Patchy areas of airspace opacity are noted particularly in the right lung which may represent edema although focal infiltrate could not be totally excluded. Bony structures are within normal limits. IMPRESSION: Changes of mild CHF. Airspace opacities in the right lung which may be related to edema or infectious etiology. Electronically Signed   By: Inez Catalina M.D.   On: 03/23/2022 22:47    EKG: Independently reviewed. SR.   Assessment/Plan   1. COVID-19; acute hypoxic respiratory failure  - Presents with worsening SOB, hypoxia, and lethargy after being diagnosed with COVID-19 at her facility a few days ago  - She was started on remdesivir and systemic steroids in ED  - Continue remdesivir, continue systemic steroids, continue supplemental O2 as needed, check procalcitonin, trend markers, continue isolation    2. PAF  - In SR on admission  - Continue Xarelto, amiodarone, diltiazem    3. Chronic diastolic CHF  - Appears compensated  - Monitor volume status    4. Asthma  - No wheezing on admission  - Continue ICS/LABA and prn albuterol    5. Thrombocytopenia  - Platelets 118,000 on admission, previously normal  - Likely from the infection, monitor with daily CBC    DVT prophylaxis: Xarelto  Code Status: Discussed with patient's family on admission. Partial, no intubation Level of Care: Level of  care: Progressive Family Communication: Daughter updated by phone  Disposition Plan:  Patient is from: SNF  Anticipated d/c is to: SNF  Anticipated d/c date is: 03/27/22  Patient currently: Pending improved respiratory status  Consults called: none  Admission status: Inpatient     Vianne Bulls, MD [facility]  03/24/2022, 4:06 AM

## 2022-03-24 NOTE — ED Notes (Signed)
MD Opyd made aware of pt bradycardic in the 61s - Per MD Opyd continue to monitor at this time

## 2022-03-24 NOTE — ED Notes (Signed)
Pt ate about half of her lunch tray.

## 2022-03-24 NOTE — ED Notes (Signed)
Pt was able to use IS with some assistance. Pt repositioned, sitting up in bed, and eating breakfast

## 2022-03-24 NOTE — ED Notes (Signed)
Pt ate over half of her breakfast

## 2022-03-24 NOTE — Progress Notes (Signed)
PROGRESS NOTE        PATIENT DETAILS Name: Stacey Davenport Age: 81 y.o. Sex: female Date of Birth: 1940-11-11 Admit Date: 03/23/2022 Admitting Physician Vianne Bulls, MD [id number], Angelyn Punt, MD  Brief Summary: Patient is a 81 y.o.  female with history of  asthma-on chronic steroids, HTN, HLD, CVA, dementia, PAF on Xarelto-who tested positive for COVID-19 at SNF on 8/23-presented to the hospital with worsening shortness of breath/fatigue-found to have acute hypoxic respiratory failure due to COVID-19 pneumonia and subsequently admitted to the hospitalist service.  See below for further details.  Significant events: 8/23>> COVID-19 positive at SNF 8/29>> admit-hypoxemia-COVID-19 pneumonia.  Significant studies: 8/29>> CXR: Airspace opacities in the right lower lung.  Significant microbiology data: 8/29>> COVID-19 PCR: Positive 8/29>> blood cultures: No growth  Procedures: None  Consults: None  Subjective: Pleasantly confused-following some commands.  Was on 13-14 L of HFNC when I walked in-while I was in the room-I was able to get her down to 3.5 L.  Coughing repeatedly  Objective: Vitals: Blood pressure (!) 125/56, pulse (!) 57, temperature 98.5 F (36.9 C), resp. rate 20, SpO2 93 %.   Exam: Gen Exam:Alert awake-not in any distress HEENT:atraumatic, normocephalic Chest: Bibasilar rales. CVS:S1S2 regular Abdomen:soft non tender, non distended Extremities:no edema Neurology: Non focal Skin: no rash  Pertinent Labs/Radiology:    Latest Ref Rng & Units 03/23/2022   10:03 PM 01/19/2022    4:22 PM 10/24/2021    6:37 AM  CBC  WBC 4.0 - 10.5 K/uL 8.6  8.3  9.2   Hemoglobin 12.0 - 15.0 g/dL 14.1  13.4  8.0   Hematocrit 36.0 - 46.0 % 42.9  43.1  27.5   Platelets 150 - 400 K/uL 118  219  195     Lab Results  Component Value Date   NA 136 03/23/2022   K 4.1 03/23/2022   CL 105 03/23/2022   CO2 22 03/23/2022      Assessment/Plan: Acute  hypoxic respiratory failure due to COVID-19 pneumonia: Titrated down to 3.5 L of oxygen this morning-apparently was on 13-14 L of oxygen overnight.  Continue steroids/Remdesivir-although no signs of volume overload-we will place on IV Lasix and try and maintain negative balance.  If hypoxia worsens dramatically-we will dose with Actemra (note consent obtained from patient's son over the phone on 8/30)  Thrombocytopenia: Appears to be mild-likely related to COVID-19-follow CBC.  PAF: Maintaining sinus rhythm-continue amiodarone/Cardizem and Xarelto.  Chronic diastolic heart failure: Euvolemic-on IV Lasix to ensure negative balance given severity of hypoxemia.  Steroid-dependent asthma: Not in exacerbation-continue bronchodilators-on steroids chronically.  Dementia: Seems to be mildly/pleasantly confused-no family at bedside-but suspect not far from baseline.  Maintain delirium precautions.  GERD: Continue PPI  BMI: Estimated body mass index is 24.27 kg/m as calculated from the following:   Height as of 02/02/22: '5\' 3"'$  (1.6 m).   Weight as of 02/02/22: 62.1 kg.   Code status:   Code Status: Partial Code   DVT Prophylaxis: rivaroxaban (XARELTO) tablet 20 mg Start: 03/24/22 1700 rivaroxaban (XARELTO) tablet 20 mg     Family Communication: Son-David Hunn-[phone]-updated over the phone on 8/30 and consent for Actemra obtained if needed.   Disposition Plan: Status is: Inpatient Remains inpatient appropriate because: Severe hypoxemia due to COVID-19-on IV steroids-requiring oxygen supplementation.   Planned Discharge Destination:Skilled nursing facility  Diet: Diet Order             DIET SOFT Room service appropriate? Yes; Fluid consistency: Thin  Diet effective now                     Antimicrobial agents: Anti-infectives (From admission, onward)    Start     Dose/Rate Route Frequency Ordered Stop   03/24/22 1000  remdesivir 100 mg in sodium chloride 0.9 % 100  mL IVPB       See Hyperspace for full Linked Orders Report.   100 mg 200 mL/hr over 30 Minutes Intravenous Daily 03/23/22 2336 03/26/22 0959   03/24/22 0015  doxycycline (VIBRAMYCIN) 100 mg in sodium chloride 0.9 % 250 mL IVPB  Status:  Discontinued        100 mg 125 mL/hr over 120 Minutes Intravenous Every 12 hours 03/24/22 0008 03/24/22 0043   03/24/22 0000  doxycycline (VIBRA-TABS) tablet 100 mg  Status:  Discontinued        100 mg Oral  Once 03/23/22 2355 03/24/22 0005   03/23/22 2345  remdesivir 200 mg in sodium chloride 0.9% 250 mL IVPB       See Hyperspace for full Linked Orders Report.   200 mg 580 mL/hr over 30 Minutes Intravenous Once 03/23/22 2336 03/24/22 0247        MEDICATIONS: Scheduled Meds:  amiodarone  200 mg Oral Daily   arformoterol  15 mcg Nebulization BID   budesonide (PULMICORT) nebulizer solution  0.25 mg Nebulization BID   diltiazem  180 mg Oral BID   furosemide  40 mg Intravenous Daily   guaiFENesin  1,200 mg Oral BID   irbesartan  37.5 mg Oral Daily   levothyroxine  75 mcg Oral QAC breakfast   methylPREDNISolone (SOLU-MEDROL) injection  60 mg Intravenous Q12H   Followed by   Derrill Memo ON 03/27/2022] predniSONE  50 mg Oral Daily   pantoprazole  40 mg Oral BID   revefenacin  175 mcg Nebulization Daily   rivaroxaban  20 mg Oral Q supper   sodium chloride flush  3 mL Intravenous Q12H   Continuous Infusions:  remdesivir 100 mg in sodium chloride 0.9 % 100 mL IVPB 100 mg (03/24/22 0952)   PRN Meds:.acetaminophen, levalbuterol, LORazepam, senna-docusate   I have personally reviewed following labs and imaging studies  LABORATORY DATA: CBC: Recent Labs  Lab 03/23/22 2203  WBC 8.6  NEUTROABS 7.4  HGB 14.1  HCT 42.9  MCV 91.3  PLT 118*    Basic Metabolic Panel: Recent Labs  Lab 03/23/22 2203 03/24/22 0200  NA 136  --   K 4.1  --   CL 105  --   CO2 22  --   GLUCOSE 93  --   BUN 14  --   CREATININE 0.82  --   CALCIUM 8.3*  --   MG  --   1.9  PHOS  --  3.3    GFR: CrCl cannot be calculated (Unknown ideal weight.).  Liver Function Tests: Recent Labs  Lab 03/23/22 2203  AST 30  ALT 31  ALKPHOS 45  BILITOT 0.7  PROT 5.9*  ALBUMIN 3.0*   No results for input(s): "LIPASE", "AMYLASE" in the last 168 hours. No results for input(s): "AMMONIA" in the last 168 hours.  Coagulation Profile: No results for input(s): "INR", "PROTIME" in the last 168 hours.  Cardiac Enzymes: No results for input(s): "CKTOTAL", "CKMB", "CKMBINDEX", "TROPONINI" in the last 168 hours.  BNP (  last 3 results) No results for input(s): "PROBNP" in the last 8760 hours.  Lipid Profile: No results for input(s): "CHOL", "HDL", "LDLCALC", "TRIG", "CHOLHDL", "LDLDIRECT" in the last 72 hours.  Thyroid Function Tests: No results for input(s): "TSH", "T4TOTAL", "FREET4", "T3FREE", "THYROIDAB" in the last 72 hours.  Anemia Panel: No results for input(s): "VITAMINB12", "FOLATE", "FERRITIN", "TIBC", "IRON", "RETICCTPCT" in the last 72 hours.  Urine analysis:    Component Value Date/Time   COLORURINE AMBER (A) 10/25/2021 1737   APPEARANCEUR TURBID (A) 10/25/2021 1737   LABSPEC 1.021 10/25/2021 1737   PHURINE 7.0 10/25/2021 1737   GLUCOSEU NEGATIVE 10/25/2021 1737   HGBUR SMALL (A) 10/25/2021 1737   BILIRUBINUR NEGATIVE 10/25/2021 1737   KETONESUR NEGATIVE 10/25/2021 1737   PROTEINUR 100 (A) 10/25/2021 1737   UROBILINOGEN 0.2 04/20/2012 1605   NITRITE NEGATIVE 10/25/2021 1737   LEUKOCYTESUR LARGE (A) 10/25/2021 1737    Sepsis Labs: Lactic Acid, Venous    Component Value Date/Time   LATICACIDVEN 1.1 03/24/2022 0200    MICROBIOLOGY: Recent Results (from the past 240 hour(s))  SARS Coronavirus 2 by RT PCR (hospital order, performed in [facility] hospital lab) *cepheid single result test* Anterior Nasal Swab     Status: Abnormal   Collection Time: 03/23/22  9:56 PM   Specimen: Anterior Nasal Swab  Result Value Ref Range Status   SARS  Coronavirus 2 by RT PCR POSITIVE (A) NEGATIVE Final    Comment: (NOTE) SARS-CoV-2 target nucleic acids are DETECTED  SARS-CoV-2 RNA is generally detectable in upper respiratory specimens  during the acute phase of infection.  Positive results are indicative  of the presence of the identified virus, but do not rule out bacterial infection or co-infection with other pathogens not detected by the test.  Clinical correlation with patient history and  other diagnostic information is necessary to determine patient infection status.  The expected result is negative.  Fact Sheet for Patients:   https://www.patel.info/   Fact Sheet for Healthcare Providers:   https://hall.com/    This test is not yet approved or cleared by the [place] FDA and  has been authorized for detection and/or diagnosis of SARS-CoV-2 by FDA under an Emergency Use Authorization (EUA).  This EUA will remain in effect (meaning this test can be used) for the duration of  the COVID-19 declaration under Section 564(b)(1)  of the Act, 21 U.S.C. section 360-bbb-3(b)(1), unless the authorization is terminated or revoked sooner.   Performed at [facility], [place] [address] [place], [place] [address]   Blood culture (routine x 2)     Status: None (Preliminary result)   Collection Time: 03/23/22 10:03 PM   Specimen: BLOOD  Result Value Ref Range Status   Specimen Description BLOOD LEFT ANTECUBITAL  Final   Special Requests   Final    BOTTLES DRAWN AEROBIC AND ANAEROBIC Blood Culture adequate volume   Culture   Final    NO GROWTH < 12 HOURS Performed at [facility], [place] [address] [place], [place] [address]    Report Status PENDING  Incomplete  Blood culture (routine x 2)     Status: None (Preliminary result)   Collection Time: 03/23/22 10:09 PM   Specimen: BLOOD  Result Value Ref Range Status   Specimen Description BLOOD RIGHT ANTECUBITAL  Final   Special  Requests   Final    BOTTLES DRAWN AEROBIC AND ANAEROBIC Blood Culture results may not be optimal due to an inadequate volume of blood  received in culture bottles   Culture   Final    NO GROWTH < 12 HOURS Performed at [facility], [place] [address], [place], [place] [address]    Report Status PENDING  Incomplete    RADIOLOGY STUDIES/RESULTS: DG Chest Portable 1 View  Result Date: 03/23/2022 CLINICAL DATA:  Shortness of breath and fevers, history of COVID-19 positivity EXAM: PORTABLE CHEST 1 VIEW COMPARISON:  10/21/2021 FINDINGS: Cardiac shadow is enlarged but stable. Aortic calcifications are seen. Increased central vascular congestion is noted consistent with underlying CHF. Patchy areas of airspace opacity are noted particularly in the right lung which may represent edema although focal infiltrate could not be totally excluded. Bony structures are within normal limits. IMPRESSION: Changes of mild CHF. Airspace opacities in the right lung which may be related to edema or infectious etiology. Electronically Signed   By: Inez Catalina M.D.   On: 03/23/2022 22:47     LOS: 0 days   Oren Binet, MD  [facility]    To contact the attending provider between 7A-7P or the covering provider during after hours 7P-7A, please log into the web site [email or web address] and access using universal [place] password for that web site. If you do not have the password, please call the hospital operator.  03/24/2022, 10:13 AM

## 2022-03-24 NOTE — ED Notes (Signed)
Lunch provided to patient, cut into small pieces

## 2022-03-24 NOTE — Evaluation (Signed)
Clinical/Bedside Swallow Evaluation Patient Details  Name: Stacey Davenport MRN: [id number] Date of Birth: 1941-02-03  Today's Date: 03/24/2022 Time: SLP Start Time (ACUTE ONLY): 60 SLP Stop Time (ACUTE ONLY): 61 SLP Time Calculation (min) (ACUTE ONLY): 25 min  Past Medical History:  Past Medical History:  Diagnosis Date   Anxiety    Asthma    Atrial fibrillation (HCC)    CVA (cerebral infarction)    a. 09/2011 MRI tiny bilat centrum semiovale acute non hemorrhagic infarcts   Dementia (HCC)    mild per patient   Esophageal stricture    Gastric polyp    Hyperplastic   Gastritis    GERD (gastroesophageal reflux disease)    Glaucoma    Hiatal hernia    Hyperlipidemia    Hypertension    Hypothyroidism    Osteoarthritis    Pneumonia    Rheumatoid arthritis(714.0)    Thyroid disease    Past Surgical History:  Past Surgical History:  Procedure Laterality Date   APPENDECTOMY     BIOPSY  10/22/2021   Procedure: BIOPSY;  Surgeon: Daneil Dolin, MD;  [facility]: [facility];  Service: Endoscopy;;  gastric biopsy for H. pylori   BLEPHAROPLASTY Right    CATARACT EXTRACTION Bilateral    CHOLECYSTECTOMY     ESOPHAGOGASTRODUODENOSCOPY (EGD) WITH PROPOFOL N/A 10/22/2021   Procedure: ESOPHAGOGASTRODUODENOSCOPY (EGD) WITH PROPOFOL;  Surgeon: Daneil Dolin, MD;  Location: [facility];  Service: Endoscopy;  Laterality: N/A;   HEMOSTASIS CLIP PLACEMENT  10/22/2021   Procedure: HEMOSTASIS CLIP PLACEMENT;  Surgeon: Daneil Dolin, MD;  Location: [facility];  Service: Endoscopy;;   POLYPECTOMY  10/22/2021   Procedure: POLYPECTOMY;  Surgeon: Daneil Dolin, MD;  [facility];  Service: Endoscopy;;  gastric    TUBAL LIGATION     HPI:  Patient is an 81 y.o. female with PMH: large hiatal hernia, dementia, HTN, HLD, CVA, asthma on chronic steroids, PAF on Xarelto. She presentedd to thehe hospital from her SNF (she is resident at [facility]) with worsening SOB and fatigue and  found to have hypoxic respiratory failure due to Covid-19 PNA. CXR showed airspace opacities in the right lung which may be related to edema or infectious etiology. Patient had EGD in March of 2023 which showed a large hiatal hernia and multiple hemorrhagic gastric polyps.    Assessment / Plan / Recommendation  Clinical Impression  Patient currently exhibiting dysphagia symptoms that are consistent with her h/o large hiatal hernia. She reports that she eats small amounts to prevent her from feeling too full in her esophagus. She endorses a fairly decent appetite in general and son denied any knowledge of her being on restricted diet from her h/o hiatal hernia. She did not exhibit any overt s/s of aspiration or penetration with cup sips of water and swallow initiation appeared timely. SpO2 was in range of 90-92%. SLP will plan to f/u next date to ensure patient tolerating PO's. She may beneft from f/u with GI (last seen OP GI 02/02/22) if her esophageal symptoms worsen. SLP Visit Diagnosis: Dysphagia, unspecified (R13.10)    Aspiration Risk  Mild aspiration risk    Diet Recommendation Dysphagia 3 (Mech soft);Thin liquid   Liquid Administration via: Cup Medication Administration: Whole meds with liquid Supervision: Patient able to self feed Compensations: Slow rate;Small sips/bites Postural Changes: Seated upright at 90 degrees;Remain upright for at least 30 minutes after po intake    Other  Recommendations Oral Care Recommendations: Oral care  BID    Recommendations for follow up therapy are one component of a multi-disciplinary discharge planning process, led by the attending physician.  Recommendations may be updated based on patient status, additional functional criteria and insurance authorization.  Follow up Recommendations No SLP follow up      Assistance Recommended at Discharge Frequent or constant Supervision/Assistance  Functional Status Assessment Patient has had a recent decline  in their functional status and demonstrates the ability to make significant improvements in function in a reasonable and predictable amount of time.  Frequency and Duration min 1 x/week  1 week       Prognosis Prognosis for Safe Diet Advancement: Good      Swallow Study   General Date of Onset: 03/24/22 HPI: Patient is an 81 y.o. female with PMH: large hiatal hernia, dementia, HTN, HLD, CVA, asthma on chronic steroids, PAF on Xarelto. She presentedd to thehe hospital from her SNF (she is resident at [facility]) with worsening SOB and fatigue and found to have hypoxic respiratory failure due to Covid-19 PNA. CXR showed airspace opacities in the right lung which may be related to edema or infectious etiology. Patient had EGD in March of 2023 which showed a large hiatal hernia and multiple hemorrhagic gastric polyps. Type of Study: Bedside Swallow Evaluation Previous Swallow Assessment: none found Diet Prior to this Study: Dysphagia 3 (soft);Thin liquids Temperature Spikes Noted: No Respiratory Status: Nasal cannula History of Recent Intubation: No Behavior/Cognition: Alert;Cooperative;Pleasant mood Oral Cavity Assessment: Within Functional Limits Oral Care Completed by SLP: No Oral Cavity - Dentition: Adequate natural dentition Vision: Functional for self-feeding Self-Feeding Abilities: Able to feed self Patient Positioning: Upright in bed Baseline Vocal Quality: Low vocal intensity Volitional Cough: Weak Volitional Swallow: Unable to elicit    Oral/Motor/Sensory Function Overall Oral Motor/Sensory Function: Within functional limits   Ice Chips     Thin Liquid Thin Liquid: Within functional limits Presentation: Cup Other Comments: Patient took small controlled sup sips of thin liquids (water) and did not exhibit any overt s/s aspiration or penetration. Swallow initiation appeared timely    Nectar Thick     Honey Thick     Puree Puree: Not tested Other Comments: Patient recently  had breakfast and she declined solids at this time due to making her feel too full   Solid     Solid: Not tested      Stacey Baller, MA, CCC-SLP Speech Therapy

## 2022-03-25 DIAGNOSIS — I5032 Chronic diastolic (congestive) heart failure: Secondary | ICD-10-CM | POA: Diagnosis not present

## 2022-03-25 DIAGNOSIS — J45909 Unspecified asthma, uncomplicated: Secondary | ICD-10-CM | POA: Diagnosis not present

## 2022-03-25 DIAGNOSIS — U071 COVID-19: Secondary | ICD-10-CM | POA: Diagnosis not present

## 2022-03-25 DIAGNOSIS — I10 Essential (primary) hypertension: Secondary | ICD-10-CM | POA: Diagnosis not present

## 2022-03-25 LAB — COMPREHENSIVE METABOLIC PANEL
ALT: 26 U/L (ref 0–44)
AST: 26 U/L (ref 15–41)
Albumin: 2.3 g/dL — ABNORMAL LOW (ref 3.5–5.0)
Alkaline Phosphatase: 42 U/L (ref 38–126)
Anion gap: 11 (ref 5–15)
BUN: 24 mg/dL — ABNORMAL HIGH (ref 8–23)
CO2: 22 mmol/L (ref 22–32)
Calcium: 8.2 mg/dL — ABNORMAL LOW (ref 8.9–10.3)
Chloride: 105 mmol/L (ref 98–111)
Creatinine, Ser: 0.73 mg/dL (ref 0.44–1.00)
GFR, Estimated: >60 mL/min (ref >60)
Glucose, Bld: 202 mg/dL — ABNORMAL HIGH (ref 70–99)
Potassium: 3.5 mmol/L (ref 3.5–5.1)
Sodium: 138 mmol/L (ref 135–145)
Total Bilirubin: 0.5 mg/dL (ref 0.3–1.2)
Total Protein: 5.2 g/dL — ABNORMAL LOW (ref 6.5–8.1)

## 2022-03-25 LAB — CBC WITH DIFFERENTIAL/PLATELET
Abs Immature Granulocytes: 0.07 10*3/uL (ref 0.00–0.07)
Basophils Absolute: 0 10*3/uL (ref 0.0–0.1)
Basophils Relative: 0 %
Eosinophils Absolute: 0 10*3/uL (ref 0.0–0.5)
Eosinophils Relative: 0 %
HCT: 44 % (ref 36.0–46.0)
Hemoglobin: 14.7 g/dL (ref 12.0–15.0)
Immature Granulocytes: 1 %
Lymphocytes Relative: 4 %
Lymphs Abs: 0.4 10*3/uL — ABNORMAL LOW (ref 0.7–4.0)
MCH: 29.8 pg (ref 26.0–34.0)
MCHC: 33.4 g/dL (ref 30.0–36.0)
MCV: 89.2 fL (ref 80.0–100.0)
Monocytes Absolute: 0.6 10*3/uL (ref 0.1–1.0)
Monocytes Relative: 5 %
Neutro Abs: 9.3 10*3/uL — ABNORMAL HIGH (ref 1.7–7.7)
Neutrophils Relative %: 90 %
Platelets: 156 10*3/uL (ref 150–400)
RBC: 4.93 MIL/uL (ref 3.87–5.11)
RDW: 16.4 % — ABNORMAL HIGH (ref 11.5–15.5)
WBC: 10.3 10*3/uL (ref 4.0–10.5)
nRBC: 0 % (ref 0.0–0.2)

## 2022-03-25 LAB — C-REACTIVE PROTEIN: CRP: 25.7 mg/dL — ABNORMAL HIGH (ref <1.0)

## 2022-03-25 LAB — D-DIMER, QUANTITATIVE: D-Dimer, Quant: 1.19 ug/mL-FEU — ABNORMAL HIGH (ref 0.00–0.50)

## 2022-03-25 MED ORDER — LATANOPROST 0.005 % OP SOLN
1.0000 [drp] | Freq: Every day | OPHTHALMIC | Status: DC
  Administered 2022-03-25 – 2022-04-04 (×11): 1 [drp] via OPHTHALMIC
  Filled 2022-03-25: qty 2.5

## 2022-03-25 MED ORDER — POLYVINYL ALCOHOL 1.4 % OP SOLN: 1.0000 [drp] | Freq: Four times a day (QID) | OPHTHALMIC | Status: DC | PRN

## 2022-03-25 MED ORDER — BRIMONIDINE TARTRATE 0.15 % OP SOLN
1.0000 [drp] | Freq: Three times a day (TID) | OPHTHALMIC | Status: DC
  Administered 2022-03-25 – 2022-04-05 (×32): 1 [drp] via OPHTHALMIC
  Filled 2022-03-25 (×2): qty 5

## 2022-03-25 NOTE — Progress Notes (Signed)
Speech Language Pathology Treatment: Dysphagia  Patient Details Name: Stacey Davenport MRN: [id number] DOB: 1941-01-27 Today's Date: 03/25/2022 Time: 1115-1140 SLP Time Calculation (min) (ACUTE ONLY): 25 min  Assessment / Plan / Recommendation Clinical Impression  Patient seen by SLP for skilled treatment focused on dysphagia goals.Patient was awake and alert and recognized SLP from previous date. After assisting RN with transferring patient from recliner to bed, SLP then observed patient with straw sips of thin liquids. Just as she did during evaluation previous date, she took very small controlled sips from straw and held in mouth briefly before swallowing. After taking three sips, she then said she did not want anymore. SLP spoke with OT who reported patient has been eating very small amounts at meal times. Patient's SpO2 was in mid 90%'s and RR was [facility]. SLP plans to f/u at least one more time before discharging to ensure she is tolerating PO's and not exhibiting any pharyngeal phase dysphagia.    HPI HPI: Patient is an 81 y.o. female with PMH: large hiatal hernia, dementia, HTN, HLD, CVA, asthma on chronic steroids, PAF on Xarelto. She presentedd to thehe hospital from her SNF (she is resident at [facility]) with worsening SOB and fatigue and found to have hypoxic respiratory failure due to Covid-19 PNA. CXR showed airspace opacities in the right lung which may be related to edema or infectious etiology. Patient had EGD in March of 2023 which showed a large hiatal hernia and multiple hemorrhagic gastric polyps.      SLP Plan  Continue with current plan of care      Recommendations for follow up therapy are one component of a multi-disciplinary discharge planning process, led by the attending physician.  Recommendations may be updated based on patient status, additional functional criteria and insurance authorization.    Recommendations  Diet recommendations: Dysphagia 3 (mechanical soft);Thin  liquid Liquids provided via: Cup;Straw Medication Administration: Whole meds with liquid Supervision: Patient able to self feed;Intermittent supervision to cue for compensatory strategies Compensations: Slow rate;Small sips/bites Postural Changes and/or Swallow Maneuvers: Seated upright 90 degrees;Upright 30-60 min after meal                Oral Care Recommendations: Oral care BID Follow Up Recommendations: No SLP follow up Assistance recommended at discharge: Intermittent Supervision/Assistance SLP Visit Diagnosis: Dysphagia, oral phase (R13.11) Plan: Continue with current plan of care          [name], MA, CCC-SLP Speech Therapy

## 2022-03-25 NOTE — Progress Notes (Signed)
RT instructed patient on the use of a flutter valve. Patient able to demonstrate back good technique.

## 2022-03-25 NOTE — Progress Notes (Addendum)
CSW confirmed that patient was COVID+ at [facility] so they are aware and can accept patient back when ready (though may not have staff to accept on weekend).  CSW left voicemail for patient's PACE SW, Marcie Bal [phone]).  Gilmore Laroche, MSW, [facility]

## 2022-03-25 NOTE — Evaluation (Signed)
Occupational Therapy Evaluation Patient Details Name: Stacey Davenport MRN: [id number] DOB: 08/31/1940 Today's Date: 03/25/2022   History of Present Illness Pt is an 81 year old woman admitted on 03/23/22 from [facility] with SOB, fatigue, hypoxic respiratory failure secondary to COVID 19. PMH: dementia, HTN, HLD, asthma, PAF, hiatal hernia.   Clinical Impression   Pt reports walking with assistance and a rollator at [facility] and participating in "exercises." Staff helps with showering and she reports sponge bathing at the sink and dressing herself. Pt presents with generalized weakness and impaired standing balance. She has baseline memory deficits. Pt requires min assist for bed mobility, mod assist to stand and min assist to transfer to chair. She requires set up to mod assist for ADLs. Pt plans to return to [place] upon discharge. VSS on 3L 02 with Sp02 of 93%.      Recommendations for follow up therapy are one component of a multi-disciplinary discharge planning process, led by the attending physician.  Recommendations may be updated based on patient status, additional functional criteria and insurance authorization.   Follow Up Recommendations  Skilled nursing-short term rehab (<3 hours/day)    Assistance Recommended at Discharge Frequent or constant Supervision/Assistance  Patient can return home with the following A lot of help with walking and/or transfers;A lot of help with bathing/dressing/bathroom;Assistance with cooking/housework;Assistance with feeding;Direct supervision/assist for medications management;Direct supervision/assist for financial management;Assist for transportation;Help with stairs or ramp for entrance    Functional Status Assessment  Patient has had a recent decline in their functional status and demonstrates the ability to make significant improvements in function in a reasonable and predictable amount of time.  Equipment Recommendations  None recommended by  OT    Recommendations for Other Services       Precautions / Restrictions Precautions Precautions: Fall      Mobility Bed Mobility Overal bed mobility: Needs Assistance Bed Mobility: Supine to Sit     Supine to sit: Min assist, HOB elevated     General bed mobility comments: min assist for hips to EOB    Transfers Overall transfer level: Needs assistance Equipment used: Rolling walker (2 wheels) Transfers: Sit to/from Stand, Bed to chair/wheelchair/BSC Sit to Stand: Mod assist     Step pivot transfers: Min assist     General transfer comment: assist to rise and steady, pt took pivotal steps from bed to recliner with min physical assist and help with multiple lines      Balance Overall balance assessment: Needs assistance   Sitting balance-Leahy Scale: Good Sitting balance - Comments: no LOB reaching down to straighten socks   Standing balance support: Bilateral upper extremity supported Standing balance-Leahy Scale: Poor Standing balance comment: reliant on B UE and external support                           ADL either performed or assessed with clinical judgement   ADL Overall ADL's : Needs assistance/impaired Eating/Feeding: Set up;Sitting   Grooming: Sitting;Set up   Upper Body Bathing: Minimal assistance;Sitting   Lower Body Bathing: Moderate assistance;Sit to/from stand   Upper Body Dressing : Set up;Sitting   Lower Body Dressing: Moderate assistance;Sit to/from stand   Toilet Transfer: Minimal assistance;Stand-pivot;Rolling walker (2 wheels) Toilet Transfer Details (indicate cue type and reason): simulated bed to chair                 Vision Baseline Vision/History: 1 Wears glasses Ability to See in  Adequate Light: 0 Adequate Patient Visual Report: No change from baseline       Perception     Praxis      Pertinent Vitals/Pain Pain Assessment Pain Assessment: Faces Faces Pain Scale: No hurt     Hand Dominance  Right   Extremity/Trunk Assessment Upper Extremity Assessment Upper Extremity Assessment: Generalized weakness   Lower Extremity Assessment Lower Extremity Assessment: Defer to PT evaluation   Cervical / Trunk Assessment Cervical / Trunk Assessment: Other exceptions Cervical / Trunk Exceptions: scoliosis   Communication Communication Communication: HOH   Cognition Arousal/Alertness: Awake/alert Behavior During Therapy: WFL for tasks assessed/performed Overall Cognitive Status: History of cognitive impairments - at baseline                                 General Comments: memory deficits     General Comments       Exercises     Shoulder Instructions      Home Living Family/patient expects to be discharged to:: Skilled nursing facility                                        Prior Functioning/Environment Prior Level of Function : Needs assist             Mobility Comments: walks with rollator and assistance ADLs Comments: assisted for showering several times a week, pt reports she sponge bathes in between seated at sink, facillity does all IADLs        OT Problem List: Decreased activity tolerance;Impaired balance (sitting and/or standing);Decreased cognition;Decreased knowledge of use of DME or AE;Decreased strength      OT Treatment/Interventions: Self-care/ADL training;DME and/or AE instruction;Therapeutic activities;Patient/family education;Balance training    OT Goals(Current goals can be found in the care plan section) Acute Rehab OT Goals OT Goal Formulation: With patient Time For Goal Achievement: 04/08/22 Potential to Achieve Goals: Good ADL Goals Pt Will Perform Grooming: sitting;with modified independence (at sink) Pt Will Perform Upper Body Bathing: with supervision;sitting Pt Will Perform Lower Body Bathing: with min assist;sit to/from stand Pt Will Perform Upper Body Dressing: with supervision;sitting Pt Will  Perform Lower Body Dressing: with min assist;sit to/from stand Pt Will Transfer to Toilet: with min assist;ambulating;bedside commode Pt Will Perform Toileting - Clothing Manipulation and hygiene: with min assist;sit to/from stand  OT Frequency: Min 2X/week    Co-evaluation              AM-PAC OT "6 Clicks" Daily Activity     Outcome Measure Help from another person eating meals?: A Little Help from another person taking care of personal grooming?: A Little Help from another person toileting, which includes using toliet, bedpan, or urinal?: A Lot Help from another person bathing (including washing, rinsing, drying)?: A Lot Help from another person to put on and taking off regular upper body clothing?: A Little Help from another person to put on and taking off regular lower body clothing?: A Lot 6 Click Score: 15   End of Session Equipment Utilized During Treatment: Gait belt;Rolling walker (2 wheels);Oxygen (3L)  Activity Tolerance: Patient tolerated treatment well Patient left: in chair;with call bell/phone within reach;with chair alarm set  OT Visit Diagnosis: Unsteadiness on feet (R26.81);Other abnormalities of gait and mobility (R26.89);Muscle weakness (generalized) (M62.81);Other symptoms and signs involving cognitive function  Time: 5909-3112 OT Time Calculation (min): 28 min Charges:  OT General Charges $OT Visit: 1 Visit OT Evaluation $OT Eval Moderate Complexity: [place], OTR/L Acute Rehabilitation Services Office: [phone]   Malka So 03/25/2022, 10:31 AM

## 2022-03-25 NOTE — Progress Notes (Signed)
PROGRESS NOTE        PATIENT DETAILS Name: Stacey Davenport Age: 81 y.o. Sex: female Date of Birth: 20-Dec-1940 Admit Date: 03/23/2022 Admitting Physician Vianne Bulls, MD PQZ:[id number], Angelyn Punt, MD  Brief Summary: Patient is a 81 y.o.  female with history of  asthma-on chronic steroids, HTN, HLD, CVA, dementia, PAF on Xarelto-who tested positive for COVID-19 at SNF on 8/23-presented to the hospital with worsening shortness of breath/fatigue-found to have acute hypoxic respiratory failure due to COVID-19 pneumonia and subsequently admitted to the hospitalist service.  See below for further details.  Significant events: 8/23>> COVID-19 positive at SNF 8/29>> admit-hypoxemia-COVID-19 pneumonia.  Significant studies: 8/29>> CXR: Airspace opacities in the right lower lung.  Significant microbiology data: 8/29>> COVID-19 PCR: Positive 8/29>> blood cultures: No growth  Procedures: None  Consults: None  Subjective: Coughing continues-stable on 3-4 L of oxygen.  Objective: Vitals: Blood pressure (!) 146/58, pulse (!) 58, temperature 98.1 F (36.7 C), temperature source Oral, resp. rate 19, SpO2 93 %.   Exam: Gen Exam:Alert awake-not in any distress HEENT:atraumatic, normocephalic Chest: Few bibasilar rales. CVS:S1S2 regular Abdomen:soft non tender, non distended Extremities:no edema Neurology: Non focal Skin: no rash   Pertinent Labs/Radiology:    Latest Ref Rng & Units 03/25/2022    4:54 AM 03/23/2022   10:03 PM 01/19/2022    4:22 PM  CBC  WBC 4.0 - 10.5 K/uL 10.3  8.6  8.3   Hemoglobin 12.0 - 15.0 g/dL 14.7  14.1  13.4   Hematocrit 36.0 - 46.0 % 44.0  42.9  43.1   Platelets 150 - 400 K/uL 156  118  219     Lab Results  Component Value Date   NA 138 03/25/2022   K 3.5 03/25/2022   CL 105 03/25/2022   CO2 22 03/25/2022      Assessment/Plan: Acute hypoxic respiratory failure due to COVID-19 pneumonia: Stable overnight-on 3-4 L of  oxygen.  CRP downtrending.  Continue steroids/Remdesivir-continue IV Lasix to maintain negative balance.  If hypoxemia worsens-we will need Actemra (consent obtained from son on 8/30)  Thrombocytopenia: Mild-due to COVID-resolved.   PAF: Maintaining sinus rhythm-continue amiodarone/Cardizem and Xarelto.  Chronic diastolic heart failure: Euvolemic-on IV Lasix to ensure negative balance given severity of hypoxemia.  Steroid-dependent asthma: Not in exacerbation-continue bronchodilators-on steroids chronically.  Dementia: Seems to be mildly/pleasantly confused-no family at bedside-but suspect not far from baseline.  Maintain delirium precautions.  GERD: Continue PPI  BMI: Estimated body mass index is 24.27 kg/m as calculated from the following:   Height as of 02/02/22: '5\' 3"'$  (1.6 m).   Weight as of 02/02/22: 62.1 kg.   Code status:   Code Status: Partial Code   DVT Prophylaxis:  rivaroxaban (XARELTO) tablet 20 mg     Family Communication: Son-David Novak-[phone]-updated over the phone on 8/31.   Disposition Plan: Status is: Inpatient Remains inpatient appropriate because: Severe hypoxemia due to COVID-19-on IV steroids-requiring oxygen supplementation.   Planned Discharge Destination:Skilled nursing facility   Diet: Diet Order             DIET SOFT Room service appropriate? Yes; Fluid consistency: Thin  Diet effective now                     Antimicrobial agents: Anti-infectives (From admission, onward)    Start  Dose/Rate Route Frequency Ordered Stop   03/24/22 1000  remdesivir 100 mg in sodium chloride 0.9 % 100 mL IVPB       See Hyperspace for full Linked Orders Report.   100 mg 200 mL/hr over 30 Minutes Intravenous Daily 03/23/22 2336 03/25/22 0942   03/24/22 0015  doxycycline (VIBRAMYCIN) 100 mg in sodium chloride 0.9 % 250 mL IVPB  Status:  Discontinued        100 mg 125 mL/hr over 120 Minutes Intravenous Every 12 hours 03/24/22 0008 03/24/22  0043   03/24/22 0000  doxycycline (VIBRA-TABS) tablet 100 mg  Status:  Discontinued        100 mg Oral  Once 03/23/22 2355 03/24/22 0005   03/23/22 2345  remdesivir 200 mg in sodium chloride 0.9% 250 mL IVPB       See Hyperspace for full Linked Orders Report.   200 mg 580 mL/hr over 30 Minutes Intravenous Once 03/23/22 2336 03/24/22 0247        MEDICATIONS: Scheduled Meds:  amiodarone  200 mg Oral Daily   arformoterol  15 mcg Nebulization BID   budesonide (PULMICORT) nebulizer solution  0.25 mg Nebulization BID   diltiazem  180 mg Oral BID   furosemide  40 mg Intravenous Daily   guaiFENesin  1,200 mg Oral BID   irbesartan  37.5 mg Oral Daily   levothyroxine  75 mcg Oral QAC breakfast   methylPREDNISolone (SOLU-MEDROL) injection  60 mg Intravenous Q12H   Followed by   Derrill Memo ON 03/27/2022] predniSONE  50 mg Oral Daily   pantoprazole  40 mg Oral BID   revefenacin  175 mcg Nebulization Daily   rivaroxaban  20 mg Oral Q supper   sodium chloride flush  3 mL Intravenous Q12H   Continuous Infusions:   PRN Meds:.acetaminophen, levalbuterol, LORazepam, senna-docusate   I have personally reviewed following labs and imaging studies  LABORATORY DATA: CBC: Recent Labs  Lab 03/23/22 2203 03/25/22 0454  WBC 8.6 10.3  NEUTROABS 7.4 9.3*  HGB 14.1 14.7  HCT 42.9 44.0  MCV 91.3 89.2  PLT 118* 156     Basic Metabolic Panel: Recent Labs  Lab 03/23/22 2203 03/24/22 0200 03/25/22 0454  NA 136  --  138  K 4.1  --  3.5  CL 105  --  105  CO2 22  --  22  GLUCOSE 93  --  202*  BUN 14  --  24*  CREATININE 0.82  --  0.73  CALCIUM 8.3*  --  8.2*  MG  --  1.9  --   PHOS  --  3.3  --      GFR: CrCl cannot be calculated (Unknown ideal weight.).  Liver Function Tests: Recent Labs  Lab 03/23/22 2203 03/25/22 0454  AST 30 26  ALT 31 26  ALKPHOS 45 42  BILITOT 0.7 0.5  PROT 5.9* 5.2*  ALBUMIN 3.0* 2.3*    No results for input(s): "LIPASE", "AMYLASE" in the last 168  hours. No results for input(s): "AMMONIA" in the last 168 hours.  Coagulation Profile: No results for input(s): "INR", "PROTIME" in the last 168 hours.  Cardiac Enzymes: No results for input(s): "CKTOTAL", "CKMB", "CKMBINDEX", "TROPONINI" in the last 168 hours.  BNP (last 3 results) No results for input(s): "PROBNP" in the last 8760 hours.  Lipid Profile: No results for input(s): "CHOL", "HDL", "LDLCALC", "TRIG", "CHOLHDL", "LDLDIRECT" in the last 72 hours.  Thyroid Function Tests: No results for input(s): "TSH", "T4TOTAL", "FREET4", "T3FREE", "  THYROIDAB" in the last 72 hours.  Anemia Panel: No results for input(s): "VITAMINB12", "FOLATE", "FERRITIN", "TIBC", "IRON", "RETICCTPCT" in the last 72 hours.  Urine analysis:    Component Value Date/Time   COLORURINE AMBER (A) 10/25/2021 1737   APPEARANCEUR TURBID (A) 10/25/2021 1737   LABSPEC 1.021 10/25/2021 1737   PHURINE 7.0 10/25/2021 1737   GLUCOSEU NEGATIVE 10/25/2021 1737   HGBUR SMALL (A) 10/25/2021 1737   BILIRUBINUR NEGATIVE 10/25/2021 1737   KETONESUR NEGATIVE 10/25/2021 1737   PROTEINUR 100 (A) 10/25/2021 1737   UROBILINOGEN 0.2 04/20/2012 1605   NITRITE NEGATIVE 10/25/2021 1737   LEUKOCYTESUR LARGE (A) 10/25/2021 1737    Sepsis Labs: Lactic Acid, Venous    Component Value Date/Time   LATICACIDVEN 1.1 03/24/2022 0200    MICROBIOLOGY: Recent Results (from the past 240 hour(s))  SARS Coronavirus 2 by RT PCR (hospital order, performed in [place] hospital lab) *cepheid single result test* Anterior Nasal Swab     Status: Abnormal   Collection Time: 03/23/22  9:56 PM   Specimen: Anterior Nasal Swab  Result Value Ref Range Status   SARS Coronavirus 2 by RT PCR POSITIVE (A) NEGATIVE Final    Comment: (NOTE) SARS-CoV-2 target nucleic acids are DETECTED  SARS-CoV-2 RNA is generally detectable in upper respiratory specimens  during the acute phase of infection.  Positive results are indicative  of the presence  of the identified virus, but do not rule out bacterial infection or co-infection with other pathogens not detected by the test.  Clinical correlation with patient history and  other diagnostic information is necessary to determine patient infection status.  The expected result is negative.  Fact Sheet for Patients:   https://www.patel.info/   Fact Sheet for Healthcare Providers:   https://hall.com/    This test is not yet approved or cleared by the [place] FDA and  has been authorized for detection and/or diagnosis of SARS-CoV-2 by FDA under an Emergency Use Authorization (EUA).  This EUA will remain in effect (meaning this test can be used) for the duration of  the COVID-19 declaration under Section 564(b)(1)  of the Act, 21 U.S.C. section 360-bbb-3(b)(1), unless the authorization is terminated or revoked sooner.   Performed at [facility], [place] [address] [place], [place] [address]   Blood culture (routine x 2)     Status: None (Preliminary result)   Collection Time: 03/23/22 10:03 PM   Specimen: BLOOD  Result Value Ref Range Status   Specimen Description BLOOD LEFT ANTECUBITAL  Final   Special Requests   Final    BOTTLES DRAWN AEROBIC AND ANAEROBIC Blood Culture adequate volume   Culture   Final    NO GROWTH 2 DAYS Performed at [facility], [place] [address], [place], [place] [address]    Report Status PENDING  Incomplete  Blood culture (routine x 2)     Status: None (Preliminary result)   Collection Time: 03/23/22 10:09 PM   Specimen: BLOOD  Result Value Ref Range Status   Specimen Description BLOOD RIGHT ANTECUBITAL  Final   Special Requests   Final    BOTTLES DRAWN AEROBIC AND ANAEROBIC Blood Culture results may not be optimal due to an inadequate volume of blood received in culture bottles   Culture   Final    NO GROWTH 2 DAYS Performed at [facility], [place] [address], [place], [place] [address]     Report Status PENDING  Incomplete    RADIOLOGY STUDIES/RESULTS: DG  Chest Portable 1 View  Result Date: 03/23/2022 CLINICAL DATA:  Shortness of breath and fevers, history of COVID-19 positivity EXAM: PORTABLE CHEST 1 VIEW COMPARISON:  10/21/2021 FINDINGS: Cardiac shadow is enlarged but stable. Aortic calcifications are seen. Increased central vascular congestion is noted consistent with underlying CHF. Patchy areas of airspace opacity are noted particularly in the right lung which may represent edema although focal infiltrate could not be totally excluded. Bony structures are within normal limits. IMPRESSION: Changes of mild CHF. Airspace opacities in the right lung which may be related to edema or infectious etiology. Electronically Signed   By: Inez Catalina M.D.   On: 03/23/2022 22:47     LOS: 1 day   Oren Binet, MD  [facility]    To contact the attending provider between 7A-7P or the covering provider during after hours 7P-7A, please log into the web site [email or web address] and access using universal [place] password for that web site. If you do not have the password, please call the hospital operator.  03/25/2022, 12:19 PM

## 2022-03-25 NOTE — Evaluation (Signed)
Physical Therapy Evaluation Patient Details Name: Stacey Davenport MRN: [id number] DOB: 12/29/1940 Today's Date: 03/25/2022  History of Present Illness  Pt is an 81 year old woman admitted on 03/23/22 from [facility] with SOB, fatigue, hypoxic respiratory failure secondary to COVID 19. PMH: dementia, HTN, HLD, asthma, PAF, hiatal hernia.  Clinical Impression   Pt presents with generalized weakness, impaired balance, increased respiratory effort and O2 needs secondary to covid 19, and decreased activity tolerance vs baseline. Pt to benefit from acute PT to address deficits. Pt tolerated stand pivot only this session, overall requiring min-mod +2 for safe mobility. PT unsure of pt's true baseline, will need to return to SNF to resume rehab. PT to progress mobility as tolerated, and will continue to follow acutely.         Recommendations for follow up therapy are one component of a multi-disciplinary discharge planning process, led by the attending physician.  Recommendations may be updated based on patient status, additional functional criteria and insurance authorization.  Follow Up Recommendations Skilled nursing-short term rehab (<3 hours/day) Can patient physically be transported by private vehicle: Yes    Assistance Recommended at Discharge Intermittent Supervision/Assistance  Patient can return home with the following  A little help with walking and/or transfers;A little help with bathing/dressing/bathroom    Equipment Recommendations None recommended by PT  Recommendations for Other Services       Functional Status Assessment Patient has had a recent decline in their functional status and demonstrates the ability to make significant improvements in function in a reasonable and predictable amount of time.     Precautions / Restrictions Precautions Precautions: Fall Restrictions Weight Bearing Restrictions: No      Mobility  Bed Mobility Overal bed mobility: Needs  Assistance Bed Mobility: Supine to Sit     Supine to sit: Min assist, HOB elevated     General bed mobility comments: min assist for hips to EOB    Transfers Overall transfer level: Needs assistance Equipment used: Rolling walker (2 wheels) Transfers: Sit to/from Stand, Bed to chair/wheelchair/BSC Sit to Stand: Min assist, +2 physical assistance   Step pivot transfers: Min assist       General transfer comment: assist +2 to rise and steady, pt took pivotal steps from bed to recliner with min physical assist and help with multiple lines    Ambulation/Gait                  Stairs            Wheelchair Mobility    Modified Rankin (Stroke Patients Only)       Balance Overall balance assessment: Needs assistance   Sitting balance-Leahy Scale: Good     Standing balance support: Bilateral upper extremity supported Standing balance-Leahy Scale: Poor Standing balance comment: reliant on B UE and external support                             Pertinent Vitals/Pain Pain Assessment Pain Assessment: Faces Faces Pain Scale: No hurt Pain Intervention(s): Monitored during session    Home Living Family/patient expects to be discharged to:: Skilled nursing facility                        Prior Function Prior Level of Function : Needs assist;Patient poor historian/Family not available             Mobility Comments: walks with rollator and  assistance, then pt stating she gets to/from wheelchair. unsure of pt true baseline ADLs Comments: assisted for showering several times a week, pt reports she sponge bathes in between seated at sink, facillity does all IADLs.     Hand Dominance   Dominant Hand: Right    Extremity/Trunk Assessment   Upper Extremity Assessment Upper Extremity Assessment: Defer to OT evaluation    Lower Extremity Assessment Lower Extremity Assessment: Generalized weakness    Cervical / Trunk  Assessment Cervical / Trunk Assessment: Other exceptions Cervical / Trunk Exceptions: scoliosis with R convexity  Communication   Communication: HOH  Cognition Arousal/Alertness: Awake/alert Behavior During Therapy: WFL for tasks assessed/performed Overall Cognitive Status: History of cognitive impairments - at baseline                                 General Comments: memory deficits        General Comments General comments (skin integrity, edema, etc.): on 2LO2    Exercises     Assessment/Plan    PT Assessment Patient needs continued PT services  PT Problem List Decreased strength;Decreased mobility;Decreased balance;Decreased knowledge of use of DME;Pain;Decreased activity tolerance;Decreased knowledge of precautions;Decreased safety awareness;Cardiopulmonary status limiting activity;Decreased cognition       PT Treatment Interventions DME instruction;Therapeutic activities;Gait training;Therapeutic exercise;Patient/family education;Balance training;Functional mobility training;Neuromuscular re-education    PT Goals (Current goals can be found in the Care Plan section)  Acute Rehab PT Goals PT Goal Formulation: With patient Time For Goal Achievement: 04/08/22 Potential to Achieve Goals: Good    Frequency Min 2X/week     Co-evaluation               AM-PAC PT "6 Clicks" Mobility  Outcome Measure Help needed turning from your back to your side while in a flat bed without using bedrails?: A Little Help needed moving from lying on your back to sitting on the side of a flat bed without using bedrails?: A Little Help needed moving to and from a bed to a chair (including a wheelchair)?: A Lot Help needed standing up from a chair using your arms (e.g., wheelchair or bedside chair)?: A Lot Help needed to walk in hospital room?: A Lot Help needed climbing 3-5 steps with a railing? : A Lot 6 Click Score: 14    End of Session Equipment Utilized During  Treatment: Oxygen Activity Tolerance: Patient tolerated treatment well Patient left: in chair;with call bell/phone within reach;with chair alarm set Nurse Communication: Mobility status PT Visit Diagnosis: Other abnormalities of gait and mobility (R26.89);Muscle weakness (generalized) (M62.81)    Time: 1638-4665 PT Time Calculation (min) (ACUTE ONLY): 28 min   Charges:   PT Evaluation $PT Eval Low Complexity: 1 Low          Johnney Scarlata S, PT DPT Acute Rehabilitation Services Pager [phone]  Office [phone]   [place] 03/25/2022, 10:57 AM

## 2022-03-25 NOTE — Progress Notes (Signed)
  Transition of Care [facility]) Screening Note   Patient Details  Name: Stacey Davenport Date of Birth: [birth year]   Transition of Care [facility]) CM/SW Contact:    Cyndi Bender, RN Phone Number: 03/25/2022, 9:12 AM    Transition of Care Department [facility]) has reviewed patient and no TOC needs have been identified at this time. We will continue to monitor patient advancement through interdisciplinary progression rounds. If new patient transition needs arise, please place a TOC consult.

## 2022-03-25 NOTE — Progress Notes (Signed)
Pt received tylenol and a stool softener for shift per request. Able to transfer from bed to [facility]. Multiple voids unable to accurately measure due to pt movement. Pt becomes dyspneic with exertion but able to recover. Family visited. Appetite is adequate. No concerns.    03/25/22 1700  Vitals  Temp (!) 97.5 F (36.4 C)  Temp Source Skin  BP 119/61  MAP (mmHg) 78  BP Location Right Arm  BP Method Automatic  Patient Position (if appropriate) Lying  Pulse Rate Source Monitor  ECG Heart Rate 74  Resp (!) 24  Level of Consciousness  Level of Consciousness Alert  MEWS COLOR  MEWS Score Color Green  Oxygen Therapy  SpO2 96 %  O2 Device HFNC  O2 Flow Rate (L/min) 3.5 L/min  Pain Assessment  Pain Scale 0-10  Pain Score 0  MEWS Score  MEWS Temp 0  MEWS Systolic 0  MEWS Pulse 0  MEWS RR 1  MEWS LOC 0  MEWS Score 1

## 2022-03-26 DIAGNOSIS — I5032 Chronic diastolic (congestive) heart failure: Secondary | ICD-10-CM | POA: Diagnosis not present

## 2022-03-26 DIAGNOSIS — U071 COVID-19: Secondary | ICD-10-CM | POA: Diagnosis not present

## 2022-03-26 DIAGNOSIS — I10 Essential (primary) hypertension: Secondary | ICD-10-CM | POA: Diagnosis not present

## 2022-03-26 DIAGNOSIS — J45909 Unspecified asthma, uncomplicated: Secondary | ICD-10-CM | POA: Diagnosis not present

## 2022-03-26 LAB — CBC WITH DIFFERENTIAL/PLATELET
Abs Immature Granulocytes: 0.13 10*3/uL — ABNORMAL HIGH (ref 0.00–0.07)
Basophils Absolute: 0 10*3/uL (ref 0.0–0.1)
Basophils Relative: 0 %
Eosinophils Absolute: 0 10*3/uL (ref 0.0–0.5)
Eosinophils Relative: 0 %
HCT: 40.7 % (ref 36.0–46.0)
Hemoglobin: 13.5 g/dL (ref 12.0–15.0)
Immature Granulocytes: 1 %
Lymphocytes Relative: 2 %
Lymphs Abs: 0.3 10*3/uL — ABNORMAL LOW (ref 0.7–4.0)
MCH: 30 pg (ref 26.0–34.0)
MCHC: 33.2 g/dL (ref 30.0–36.0)
MCV: 90.4 fL (ref 80.0–100.0)
Monocytes Absolute: 0.7 10*3/uL (ref 0.1–1.0)
Monocytes Relative: 4 %
Neutro Abs: 18.3 10*3/uL — ABNORMAL HIGH (ref 1.7–7.7)
Neutrophils Relative %: 93 %
Platelets: 182 10*3/uL (ref 150–400)
RBC: 4.5 MIL/uL (ref 3.87–5.11)
RDW: 16.4 % — ABNORMAL HIGH (ref 11.5–15.5)
WBC: 19.5 10*3/uL — ABNORMAL HIGH (ref 4.0–10.5)
nRBC: 0 % (ref 0.0–0.2)

## 2022-03-26 LAB — COMPREHENSIVE METABOLIC PANEL
ALT: 22 U/L (ref 0–44)
AST: 17 U/L (ref 15–41)
Albumin: 2.3 g/dL — ABNORMAL LOW (ref 3.5–5.0)
Alkaline Phosphatase: 43 U/L (ref 38–126)
Anion gap: 8 (ref 5–15)
BUN: 33 mg/dL — ABNORMAL HIGH (ref 8–23)
CO2: 25 mmol/L (ref 22–32)
Calcium: 8.3 mg/dL — ABNORMAL LOW (ref 8.9–10.3)
Chloride: 106 mmol/L (ref 98–111)
Creatinine, Ser: 0.81 mg/dL (ref 0.44–1.00)
GFR, Estimated: >60 mL/min (ref >60)
Glucose, Bld: 182 mg/dL — ABNORMAL HIGH (ref 70–99)
Potassium: 3.8 mmol/L (ref 3.5–5.1)
Sodium: 139 mmol/L (ref 135–145)
Total Bilirubin: 0.7 mg/dL (ref 0.3–1.2)
Total Protein: 5 g/dL — ABNORMAL LOW (ref 6.5–8.1)

## 2022-03-26 LAB — C-REACTIVE PROTEIN: CRP: 14.9 mg/dL — ABNORMAL HIGH (ref <1.0)

## 2022-03-26 LAB — D-DIMER, QUANTITATIVE: D-Dimer, Quant: 0.89 ug/mL-FEU — ABNORMAL HIGH (ref 0.00–0.50)

## 2022-03-26 NOTE — Progress Notes (Signed)
Occupational Therapy Treatment Patient Details Name: Stacey Davenport MRN: [id number] DOB: Sep 25, 1940 Today's Date: 03/26/2022   History of present illness Pt is an 81 year old woman admitted on 03/23/22 from [facility] with SOB, fatigue, hypoxic respiratory failure secondary to COVID 19. PMH: dementia, HTN, HLD, asthma, PAF, hiatal hernia.   OT comments  Min assist from chair to [facility], BSC to bed with RW. Total assist for pericare in standing and max assist to change mesh panties. Pt on 4L 02 with Sp02 91-93%.    Recommendations for follow up therapy are one component of a multi-disciplinary discharge planning process, led by the attending physician.  Recommendations may be updated based on patient status, additional functional criteria and insurance authorization.    Follow Up Recommendations  Skilled nursing-short term rehab (<3 hours/day)    Assistance Recommended at Discharge Frequent or constant Supervision/Assistance  Patient can return home with the following  A little help with walking and/or transfers;A lot of help with bathing/dressing/bathroom;Assistance with cooking/housework;Direct supervision/assist for medications management;Direct supervision/assist for financial management;Assist for transportation;Help with stairs or ramp for entrance   Equipment Recommendations  None recommended by OT    Recommendations for Other Services      Precautions / Restrictions Precautions Precautions: Fall Restrictions Weight Bearing Restrictions: No       Mobility Bed Mobility Overal bed mobility: Needs Assistance Bed Mobility: Sit to Supine       Sit to supine: Min guard   General bed mobility comments: pt able to get LEs back into bed    Transfers Overall transfer level: Needs assistance Equipment used: Rolling walker (2 wheels) Transfers: Sit to/from Stand, Bed to chair/wheelchair/BSC Sit to Stand: Min assist     Step pivot transfers: Min assist     General transfer  comment: assist to rise and steady, line management     Balance Overall balance assessment: Needs assistance   Sitting balance-Leahy Scale: Good     Standing balance support: Bilateral upper extremity supported Standing balance-Leahy Scale: Poor Standing balance comment: reliant on B UE and external support                           ADL either performed or assessed with clinical judgement   ADL Overall ADL's : Needs assistance/impaired                     Lower Body Dressing: Maximal assistance;Bed level Lower Body Dressing Details (indicate cue type and reason): donning and doffing mesh panties Toilet Transfer: Minimal assistance;Stand-pivot;Rolling walker (2 wheels)   Toileting- Clothing Manipulation and Hygiene: Total assistance;Sit to/from stand              Extremity/Trunk Assessment              Vision       Perception     Praxis      Cognition Arousal/Alertness: Awake/alert Behavior During Therapy: WFL for tasks assessed/performed Overall Cognitive Status: History of cognitive impairments - at baseline                                 General Comments: memory deficits        Exercises      Shoulder Instructions       General Comments      Pertinent Vitals/ Pain       Pain Assessment Pain Assessment:  No/denies pain  Home Living                                          Prior Functioning/Environment              Frequency  Min 2X/week        Progress Toward Goals  OT Goals(current goals can now be found in the care plan section)  Progress towards OT goals: Progressing toward goals  Acute Rehab OT Goals OT Goal Formulation: With patient Time For Goal Achievement: 04/08/22 Potential to Achieve Goals: Good  Plan Discharge plan remains appropriate    Co-evaluation                 AM-PAC OT "6 Clicks" Daily Activity     Outcome Measure   Help from another person  eating meals?: A Little Help from another person taking care of personal grooming?: A Little Help from another person toileting, which includes using toliet, bedpan, or urinal?: A Lot Help from another person bathing (including washing, rinsing, drying)?: A Lot Help from another person to put on and taking off regular upper body clothing?: A Little Help from another person to put on and taking off regular lower body clothing?: A Lot 6 Click Score: 15    End of Session Equipment Utilized During Treatment: Gait belt;Rolling walker (2 wheels);Oxygen (4L)  OT Visit Diagnosis: Unsteadiness on feet (R26.81);Other abnormalities of gait and mobility (R26.89);Muscle weakness (generalized) (M62.81);Other symptoms and signs involving cognitive function   Activity Tolerance Patient tolerated treatment well   Patient Left in bed;with call bell/phone within reach;with bed alarm set   Nurse Communication Mobility status;Other (comment) (pt had BM)        Time: 1150-1250 OT Time Calculation (min): 60 min  Charges: OT General Charges $OT Visit: 1 Visit OT Treatments $Self Care/Home Management : 53-67 mins  Cleta Alberts, OTR/L Acute Rehabilitation Services Office: [phone]   [place]alka So 03/26/2022, 2:24 PM

## 2022-03-26 NOTE — Progress Notes (Signed)
PROGRESS NOTE        PATIENT DETAILS Name: Stacey Davenport Age: 81 y.o. Sex: female Date of Birth: 03-18-1941 Admit Date: 03/23/2022 Admitting Physician Vianne Bulls, MD ZOX:[id number], Angelyn Punt, MD  Brief Summary: Patient is a 81 y.o.  female with history of  asthma-on chronic steroids, HTN, HLD, CVA, dementia, PAF on Xarelto-who tested positive for COVID-19 at SNF on 8/23-presented to the hospital with worsening shortness of breath/fatigue-found to have acute hypoxic respiratory failure due to COVID-19 pneumonia and subsequently admitted to the hospitalist service.  See below for further details.  Significant events: 8/23>> COVID-19 positive at SNF 8/29>> admit-hypoxemia-COVID-19 pneumonia.  Significant studies: 8/29>> CXR: Airspace opacities in the right lower lung.  Significant microbiology data: 8/29>> COVID-19 PCR: Positive 8/29>> blood cultures: No growth  Procedures: None  Consults: None  Subjective: No major issues overnight-on 2-3 L of oxygen.  Cough is better per patient.  Objective: Vitals: Blood pressure 125/64, pulse (!) 54, temperature 97.6 F (36.4 C), temperature source Oral, resp. rate 20, SpO2 93 %.   Exam: Gen Exam:Alert awake-not in any distress HEENT:atraumatic, normocephalic Chest: B/L clear to auscultation anteriorly CVS:S1S2 regular Abdomen:soft non tender, non distended Extremities:no edema Neurology: Non focal Skin: no rash   Pertinent Labs/Radiology:    Latest Ref Rng & Units 03/26/2022    8:08 AM 03/25/2022    4:54 AM 03/23/2022   10:03 PM  CBC  WBC 4.0 - 10.5 K/uL 19.5  10.3  8.6   Hemoglobin 12.0 - 15.0 g/dL 13.5  14.7  14.1   Hematocrit 36.0 - 46.0 % 40.7  44.0  42.9   Platelets 150 - 400 K/uL 182  156  118     Lab Results  Component Value Date   NA 139 03/26/2022   K 3.8 03/26/2022   CL 106 03/26/2022   CO2 25 03/26/2022      Assessment/Plan: Acute hypoxic respiratory failure due to COVID-19  pneumonia: Slowly improving-on 3 L of oxygen this morning.  CRP downtrending.  Remains on steroids/Remdesivir.  Continue IV Lasix to ensure negative balance.  Continue attempts to mobilize/use incentive spirometry.  Leukocytosis: Due to IV steroids-doubt bacterial superinfection given clinical improvement.  Follow CBC.  Thrombocytopenia: Mild-due to COVID-resolved.   PAF: Maintaining sinus rhythm-continue amiodarone/Cardizem and Xarelto.  Chronic diastolic heart failure: Euvolemic-on IV Lasix to ensure negative balance given severity of hypoxemia.  Steroid-dependent asthma: Not in exacerbation-continue bronchodilators-on steroids chronically.  Dementia: Seems to be mildly/pleasantly confused-no family at bedside-but suspect not far from baseline.  Maintain delirium precautions.  GERD: Continue PPI  BMI: Estimated body mass index is 24.27 kg/m as calculated from the following:   Height as of 02/02/22: '5\' 3"'$  (1.6 m).   Weight as of 02/02/22: 62.1 kg.   Code status:   Code Status: Partial Code   DVT Prophylaxis:  rivaroxaban (XARELTO) tablet 20 mg     Family Communication: Son-David Butson-[phone]-updated over the phone on 8/31.   Disposition Plan: Status is: Inpatient Remains inpatient appropriate because: Severe hypoxemia due to COVID-19-on IV steroids-requiring oxygen supplementation.   Planned Discharge Destination:Skilled nursing facility early next week.   Diet: Diet Order             DIET SOFT Room service appropriate? Yes; Fluid consistency: Thin  Diet effective now  Antimicrobial agents: Anti-infectives (From admission, onward)    Start     Dose/Rate Route Frequency Ordered Stop   03/24/22 1000  remdesivir 100 mg in sodium chloride 0.9 % 100 mL IVPB       See Hyperspace for full Linked Orders Report.   100 mg 200 mL/hr over 30 Minutes Intravenous Daily 03/23/22 2336 03/25/22 0942   03/24/22 0015  doxycycline (VIBRAMYCIN) 100 mg  in sodium chloride 0.9 % 250 mL IVPB  Status:  Discontinued        100 mg 125 mL/hr over 120 Minutes Intravenous Every 12 hours 03/24/22 0008 03/24/22 0043   03/24/22 0000  doxycycline (VIBRA-TABS) tablet 100 mg  Status:  Discontinued        100 mg Oral  Once 03/23/22 2355 03/24/22 0005   03/23/22 2345  remdesivir 200 mg in sodium chloride 0.9% 250 mL IVPB       See Hyperspace for full Linked Orders Report.   200 mg 580 mL/hr over 30 Minutes Intravenous Once 03/23/22 2336 03/24/22 0247        MEDICATIONS: Scheduled Meds:  amiodarone  200 mg Oral Daily   arformoterol  15 mcg Nebulization BID   brimonidine  1 drop Left Eye TID   budesonide (PULMICORT) nebulizer solution  0.25 mg Nebulization BID   diltiazem  180 mg Oral BID   furosemide  40 mg Intravenous Daily   guaiFENesin  1,200 mg Oral BID   irbesartan  37.5 mg Oral Daily   latanoprost  1 drop Both Eyes QHS   levothyroxine  75 mcg Oral QAC breakfast   methylPREDNISolone (SOLU-MEDROL) injection  60 mg Intravenous Q12H   Followed by   Derrill Memo ON 03/27/2022] predniSONE  50 mg Oral Daily   pantoprazole  40 mg Oral BID   revefenacin  175 mcg Nebulization Daily   rivaroxaban  20 mg Oral Q supper   sodium chloride flush  3 mL Intravenous Q12H   Continuous Infusions:   PRN Meds:.acetaminophen, levalbuterol, LORazepam, polyvinyl alcohol, senna-docusate   I have personally reviewed following labs and imaging studies  LABORATORY DATA: CBC: Recent Labs  Lab 03/23/22 2203 03/25/22 0454 03/26/22 0808  WBC 8.6 10.3 19.5*  NEUTROABS 7.4 9.3* 18.3*  HGB 14.1 14.7 13.5  HCT 42.9 44.0 40.7  MCV 91.3 89.2 90.4  PLT 118* 156 182     Basic Metabolic Panel: Recent Labs  Lab 03/23/22 2203 03/24/22 0200 03/25/22 0454 03/26/22 0808  NA 136  --  138 139  K 4.1  --  3.5 3.8  CL 105  --  105 106  CO2 22  --  22 25  GLUCOSE 93  --  202* 182*  BUN 14  --  24* 33*  CREATININE 0.82  --  0.73 0.81  CALCIUM 8.3*  --  8.2* 8.3*   MG  --  1.9  --   --   PHOS  --  3.3  --   --      GFR: CrCl cannot be calculated (Unknown ideal weight.).  Liver Function Tests: Recent Labs  Lab 03/23/22 2203 03/25/22 0454 03/26/22 0808  AST '30 26 17  '$ ALT '31 26 22  '$ ALKPHOS 45 42 43  BILITOT 0.7 0.5 0.7  PROT 5.9* 5.2* 5.0*  ALBUMIN 3.0* 2.3* 2.3*    No results for input(s): "LIPASE", "AMYLASE" in the last 168 hours. No results for input(s): "AMMONIA" in the last 168 hours.  Coagulation Profile: No results for input(s): "INR", "PROTIME"  in the last 168 hours.  Cardiac Enzymes: No results for input(s): "CKTOTAL", "CKMB", "CKMBINDEX", "TROPONINI" in the last 168 hours.  BNP (last 3 results) No results for input(s): "PROBNP" in the last 8760 hours.  Lipid Profile: No results for input(s): "CHOL", "HDL", "LDLCALC", "TRIG", "CHOLHDL", "LDLDIRECT" in the last 72 hours.  Thyroid Function Tests: No results for input(s): "TSH", "T4TOTAL", "FREET4", "T3FREE", "THYROIDAB" in the last 72 hours.  Anemia Panel: No results for input(s): "VITAMINB12", "FOLATE", "FERRITIN", "TIBC", "IRON", "RETICCTPCT" in the last 72 hours.  Urine analysis:    Component Value Date/Time   COLORURINE AMBER (A) 10/25/2021 1737   APPEARANCEUR TURBID (A) 10/25/2021 1737   LABSPEC 1.021 10/25/2021 1737   PHURINE 7.0 10/25/2021 1737   GLUCOSEU NEGATIVE 10/25/2021 1737   HGBUR SMALL (A) 10/25/2021 1737   BILIRUBINUR NEGATIVE 10/25/2021 1737   KETONESUR NEGATIVE 10/25/2021 1737   PROTEINUR 100 (A) 10/25/2021 1737   UROBILINOGEN 0.2 04/20/2012 1605   NITRITE NEGATIVE 10/25/2021 1737   LEUKOCYTESUR LARGE (A) 10/25/2021 1737    Sepsis Labs: Lactic Acid, Venous    Component Value Date/Time   LATICACIDVEN 1.1 03/24/2022 0200    MICROBIOLOGY: Recent Results (from the past 240 hour(s))  SARS Coronavirus 2 by RT PCR (hospital order, performed in [place] hospital lab) *cepheid single result test* Anterior Nasal Swab     Status: Abnormal    Collection Time: 03/23/22  9:56 PM   Specimen: Anterior Nasal Swab  Result Value Ref Range Status   SARS Coronavirus 2 by RT PCR POSITIVE (A) NEGATIVE Final    Comment: (NOTE) SARS-CoV-2 target nucleic acids are DETECTED  SARS-CoV-2 RNA is generally detectable in upper respiratory specimens  during the acute phase of infection.  Positive results are indicative  of the presence of the identified virus, but do not rule out bacterial infection or co-infection with other pathogens not detected by the test.  Clinical correlation with patient history and  other diagnostic information is necessary to determine patient infection status.  The expected result is negative.  Fact Sheet for Patients:   https://www.patel.info/   Fact Sheet for Healthcare Providers:   https://hall.com/    This test is not yet approved or cleared by the [place] FDA and  has been authorized for detection and/or diagnosis of SARS-CoV-2 by FDA under an Emergency Use Authorization (EUA).  This EUA will remain in effect (meaning this test can be used) for the duration of  the COVID-19 declaration under Section 564(b)(1)  of the Act, 21 U.S.C. section 360-bbb-3(b)(1), unless the authorization is terminated or revoked sooner.   Performed at [facility], [place] [address], [place], [place] [address]   Blood culture (routine x 2)     Status: None (Preliminary result)   Collection Time: 03/23/22 10:03 PM   Specimen: BLOOD  Result Value Ref Range Status   Specimen Description BLOOD LEFT ANTECUBITAL  Final   Special Requests   Final    BOTTLES DRAWN AEROBIC AND ANAEROBIC Blood Culture adequate volume   Culture   Final    NO GROWTH 3 DAYS Performed at [facility], [place] [address], [place],  [address]    Report Status PENDING  Incomplete  Blood culture (routine x 2)     Status: None (Preliminary result)   Collection Time: 03/23/22 10:09 PM   Specimen:  BLOOD  Result Value Ref Range Status   Specimen Description BLOOD RIGHT ANTECUBITAL  Final   Special Requests   Final  BOTTLES DRAWN AEROBIC AND ANAEROBIC Blood Culture results may not be optimal due to an inadequate volume of blood received in culture bottles   Culture   Final    NO GROWTH 3 DAYS Performed at [facility], [place] [address], [place], [place] [address]    Report Status PENDING  Incomplete    RADIOLOGY STUDIES/RESULTS: No results found.   LOS: 2 days   Oren Binet, MD  [facility]    To contact the attending provider between 7A-7P or the covering provider during after hours 7P-7A, please log into the web site [email or web address] and access using universal [place] password for that web site. If you do not have the password, please call the hospital operator.  03/26/2022, 12:02 PM

## 2022-03-27 DIAGNOSIS — I10 Essential (primary) hypertension: Secondary | ICD-10-CM | POA: Diagnosis not present

## 2022-03-27 DIAGNOSIS — I5032 Chronic diastolic (congestive) heart failure: Secondary | ICD-10-CM | POA: Diagnosis not present

## 2022-03-27 DIAGNOSIS — J45909 Unspecified asthma, uncomplicated: Secondary | ICD-10-CM | POA: Diagnosis not present

## 2022-03-27 DIAGNOSIS — U071 COVID-19: Secondary | ICD-10-CM | POA: Diagnosis not present

## 2022-03-27 LAB — C-REACTIVE PROTEIN: CRP: 11.4 mg/dL — ABNORMAL HIGH (ref <1.0)

## 2022-03-27 LAB — COMPREHENSIVE METABOLIC PANEL
ALT: 22 U/L (ref 0–44)
AST: 17 U/L (ref 15–41)
Albumin: 2.3 g/dL — ABNORMAL LOW (ref 3.5–5.0)
Alkaline Phosphatase: 47 U/L (ref 38–126)
Anion gap: 13 (ref 5–15)
BUN: 34 mg/dL — ABNORMAL HIGH (ref 8–23)
CO2: 23 mmol/L (ref 22–32)
Calcium: 8.6 mg/dL — ABNORMAL LOW (ref 8.9–10.3)
Chloride: 107 mmol/L (ref 98–111)
Creatinine, Ser: 0.77 mg/dL (ref 0.44–1.00)
GFR, Estimated: >60 mL/min (ref >60)
Glucose, Bld: 200 mg/dL — ABNORMAL HIGH (ref 70–99)
Potassium: 3.6 mmol/L (ref 3.5–5.1)
Sodium: 143 mmol/L (ref 135–145)
Total Bilirubin: 0.4 mg/dL (ref 0.3–1.2)
Total Protein: 5.1 g/dL — ABNORMAL LOW (ref 6.5–8.1)

## 2022-03-27 LAB — CBC WITH DIFFERENTIAL/PLATELET
Abs Immature Granulocytes: 0.34 10*3/uL — ABNORMAL HIGH (ref 0.00–0.07)
Basophils Absolute: 0 10*3/uL (ref 0.0–0.1)
Basophils Relative: 0 %
Eosinophils Absolute: 0 10*3/uL (ref 0.0–0.5)
Eosinophils Relative: 0 %
HCT: 40.8 % (ref 36.0–46.0)
Hemoglobin: 13.4 g/dL (ref 12.0–15.0)
Immature Granulocytes: 2 %
Lymphocytes Relative: 1 %
Lymphs Abs: 0.3 10*3/uL — ABNORMAL LOW (ref 0.7–4.0)
MCH: 29.6 pg (ref 26.0–34.0)
MCHC: 32.8 g/dL (ref 30.0–36.0)
MCV: 90.1 fL (ref 80.0–100.0)
Monocytes Absolute: 0.8 10*3/uL (ref 0.1–1.0)
Monocytes Relative: 4 %
Neutro Abs: 17.7 10*3/uL — ABNORMAL HIGH (ref 1.7–7.7)
Neutrophils Relative %: 93 %
Platelets: 165 10*3/uL (ref 150–400)
RBC: 4.53 MIL/uL (ref 3.87–5.11)
RDW: 16.6 % — ABNORMAL HIGH (ref 11.5–15.5)
WBC: 19.1 10*3/uL — ABNORMAL HIGH (ref 4.0–10.5)
nRBC: 0 % (ref 0.0–0.2)

## 2022-03-27 LAB — D-DIMER, QUANTITATIVE: D-Dimer, Quant: 0.93 ug/mL-FEU — ABNORMAL HIGH (ref 0.00–0.50)

## 2022-03-27 MED ORDER — TRAMADOL HCL 50 MG PO TABS
25.0000 mg | ORAL_TABLET | Freq: Two times a day (BID) | ORAL | Status: DC | PRN
  Administered 2022-03-27: 25 mg via ORAL
  Filled 2022-03-27: qty 1

## 2022-03-27 NOTE — Progress Notes (Signed)
PROGRESS NOTE        PATIENT DETAILS Name: Stacey Davenport Age: 81 y.o. Sex: female Date of Birth: 03/08/1941 Admit Date: 03/23/2022 Admitting Physician Vianne Bulls, MD JHE:[id number], Angelyn Punt, MD  Brief Summary: Patient is a 81 y.o.  female with history of  asthma-on chronic steroids, HTN, HLD, CVA, dementia, PAF on Xarelto-who tested positive for COVID-19 at SNF on 8/23-presented to the hospital with worsening shortness of breath/fatigue-found to have acute hypoxic respiratory failure due to COVID-19 pneumonia and subsequently admitted to the hospitalist service.  See below for further details.  Significant events: 8/23>> COVID-19 positive at SNF 8/29>> admit-hypoxemia-COVID-19 pneumonia.  Significant studies: 8/29>> CXR: Airspace opacities in the right lower lung.  Significant microbiology data: 8/29>> COVID-19 PCR: Positive 8/29>> blood cultures: No growth  Procedures: None  Consults: None  Subjective: No major issues overnight-titrated down to 2 L of oxygen this morning.  Objective: Vitals: Blood pressure 134/63, pulse (!) 57, temperature 97.6 F (36.4 C), temperature source Oral, resp. rate 18, SpO2 91 %.   Exam: Gen Exam:Alert awake-not in any distress HEENT:atraumatic, normocephalic Chest: B/L clear to auscultation anteriorly CVS:S1S2 regular Abdomen:soft non tender, non distended Extremities:no edema Neurology: Non focal Skin: no rash   Pertinent Labs/Radiology:    Latest Ref Rng & Units 03/27/2022    5:52 AM 03/26/2022    8:08 AM 03/25/2022    4:54 AM  CBC  WBC 4.0 - 10.5 K/uL 19.1  19.5  10.3   Hemoglobin 12.0 - 15.0 g/dL 13.4  13.5  14.7   Hematocrit 36.0 - 46.0 % 40.8  40.7  44.0   Platelets 150 - 400 K/uL 165  182  156     Lab Results  Component Value Date   NA 143 03/27/2022   K 3.6 03/27/2022   CL 107 03/27/2022   CO2 23 03/27/2022      Assessment/Plan: Acute hypoxic respiratory failure due to COVID-19  pneumonia: Slowly improving-titrated down to 2 L-CRP down to 11.4.  Stable overnight-on 3-4 L of oxygen.  CRP downtrending.  No longer on Remdesivir-has been transitioned to oral steroids.    Thrombocytopenia: Mild-due to COVID-resolved.   PAF: Maintaining sinus rhythm-continue amiodarone/Cardizem and Xarelto.  Chronic diastolic heart failure: Remains euvolemic-on IV Lasix to ensure negative balance.  Continue to follow electrolytes/intake/output closely.    Steroid-dependent asthma: Not in exacerbation-continue bronchodilators-on steroids chronically.  Dementia: Seems to be mildly/pleasantly confused-no family at bedside-but suspect not far from baseline.  Maintain delirium precautions.  GERD: Continue PPI  BMI: Estimated body mass index is 24.27 kg/m as calculated from the following:   Height as of 02/02/22: '5\' 3"'$  (1.6 m).   Weight as of 02/02/22: 62.1 kg.   Code status:   Code Status: Partial Code   DVT Prophylaxis:  rivaroxaban (XARELTO) tablet 20 mg     Family Communication: Son-David Elster-[phone]-updated over the phone on 8/31.   Disposition Plan: Status is: Inpatient Remains inpatient appropriate because: Improving hypoxemia-needs inpatient optimization before consideration of discharge.   Planned Discharge Destination:Skilled nursing facility likely early next week.   Diet: Diet Order             DIET SOFT Room service appropriate? Yes; Fluid consistency: Thin  Diet effective now  Antimicrobial agents: Anti-infectives (From admission, onward)    Start     Dose/Rate Route Frequency Ordered Stop   03/24/22 1000  remdesivir 100 mg in sodium chloride 0.9 % 100 mL IVPB       See Hyperspace for full Linked Orders Report.   100 mg 200 mL/hr over 30 Minutes Intravenous Daily 03/23/22 2336 03/25/22 0942   03/24/22 0015  doxycycline (VIBRAMYCIN) 100 mg in sodium chloride 0.9 % 250 mL IVPB  Status:  Discontinued        100 mg 125  mL/hr over 120 Minutes Intravenous Every 12 hours 03/24/22 0008 03/24/22 0043   03/24/22 0000  doxycycline (VIBRA-TABS) tablet 100 mg  Status:  Discontinued        100 mg Oral  Once 03/23/22 2355 03/24/22 0005   03/23/22 2345  remdesivir 200 mg in sodium chloride 0.9% 250 mL IVPB       See Hyperspace for full Linked Orders Report.   200 mg 580 mL/hr over 30 Minutes Intravenous Once 03/23/22 2336 03/24/22 0247        MEDICATIONS: Scheduled Meds:  amiodarone  200 mg Oral Daily   arformoterol  15 mcg Nebulization BID   brimonidine  1 drop Left Eye TID   budesonide (PULMICORT) nebulizer solution  0.25 mg Nebulization BID   diltiazem  180 mg Oral BID   furosemide  40 mg Intravenous Daily   guaiFENesin  1,200 mg Oral BID   irbesartan  37.5 mg Oral Daily   latanoprost  1 drop Both Eyes QHS   levothyroxine  75 mcg Oral QAC breakfast   pantoprazole  40 mg Oral BID   predniSONE  50 mg Oral Daily   revefenacin  175 mcg Nebulization Daily   rivaroxaban  20 mg Oral Q supper   sodium chloride flush  3 mL Intravenous Q12H   Continuous Infusions:   PRN Meds:.acetaminophen, levalbuterol, LORazepam, polyvinyl alcohol, senna-docusate, traMADol   I have personally reviewed following labs and imaging studies  LABORATORY DATA: CBC: Recent Labs  Lab 03/23/22 2203 03/25/22 0454 03/26/22 0808 03/27/22 0552  WBC 8.6 10.3 19.5* 19.1*  NEUTROABS 7.4 9.3* 18.3* 17.7*  HGB 14.1 14.7 13.5 13.4  HCT 42.9 44.0 40.7 40.8  MCV 91.3 89.2 90.4 90.1  PLT 118* 156 182 165     Basic Metabolic Panel: Recent Labs  Lab 03/23/22 2203 03/24/22 0200 03/25/22 0454 03/26/22 0808 03/27/22 0552  NA 136  --  138 139 143  K 4.1  --  3.5 3.8 3.6  CL 105  --  105 106 107  CO2 22  --  '22 25 23  '$ GLUCOSE 93  --  202* 182* 200*  BUN 14  --  24* 33* 34*  CREATININE 0.82  --  0.73 0.81 0.77  CALCIUM 8.3*  --  8.2* 8.3* 8.6*  MG  --  1.9  --   --   --   PHOS  --  3.3  --   --   --      GFR: CrCl  cannot be calculated (Unknown ideal weight.).  Liver Function Tests: Recent Labs  Lab 03/23/22 2203 03/25/22 0454 03/26/22 0808 03/27/22 0552  AST '30 26 17 17  '$ ALT '31 26 22 22  '$ ALKPHOS 45 42 43 47  BILITOT 0.7 0.5 0.7 0.4  PROT 5.9* 5.2* 5.0* 5.1*  ALBUMIN 3.0* 2.3* 2.3* 2.3*    No results for input(s): "LIPASE", "AMYLASE" in the last 168 hours. No results for  input(s): "AMMONIA" in the last 168 hours.  Coagulation Profile: No results for input(s): "INR", "PROTIME" in the last 168 hours.  Cardiac Enzymes: No results for input(s): "CKTOTAL", "CKMB", "CKMBINDEX", "TROPONINI" in the last 168 hours.  BNP (last 3 results) No results for input(s): "PROBNP" in the last 8760 hours.  Lipid Profile: No results for input(s): "CHOL", "HDL", "LDLCALC", "TRIG", "CHOLHDL", "LDLDIRECT" in the last 72 hours.  Thyroid Function Tests: No results for input(s): "TSH", "T4TOTAL", "FREET4", "T3FREE", "THYROIDAB" in the last 72 hours.  Anemia Panel: No results for input(s): "VITAMINB12", "FOLATE", "FERRITIN", "TIBC", "IRON", "RETICCTPCT" in the last 72 hours.  Urine analysis:    Component Value Date/Time   COLORURINE AMBER (A) 10/25/2021 1737   APPEARANCEUR TURBID (A) 10/25/2021 1737   LABSPEC 1.021 10/25/2021 1737   PHURINE 7.0 10/25/2021 1737   GLUCOSEU NEGATIVE 10/25/2021 1737   HGBUR SMALL (A) 10/25/2021 1737   BILIRUBINUR NEGATIVE 10/25/2021 1737   KETONESUR NEGATIVE 10/25/2021 1737   PROTEINUR 100 (A) 10/25/2021 1737   UROBILINOGEN 0.2 04/20/2012 1605   NITRITE NEGATIVE 10/25/2021 1737   LEUKOCYTESUR LARGE (A) 10/25/2021 1737    Sepsis Labs: Lactic Acid, Venous    Component Value Date/Time   LATICACIDVEN 1.1 03/24/2022 0200    MICROBIOLOGY: Recent Results (from the past 240 hour(s))  SARS Coronavirus 2 by RT PCR (hospital order, performed in [facility] hospital lab) *cepheid single result test* Anterior Nasal Swab     Status: Abnormal   Collection Time: 03/23/22  9:56  PM   Specimen: Anterior Nasal Swab  Result Value Ref Range Status   SARS Coronavirus 2 by RT PCR POSITIVE (A) NEGATIVE Final    Comment: (NOTE) SARS-CoV-2 target nucleic acids are DETECTED  SARS-CoV-2 RNA is generally detectable in upper respiratory specimens  during the acute phase of infection.  Positive results are indicative  of the presence of the identified virus, but do not rule out bacterial infection or co-infection with other pathogens not detected by the test.  Clinical correlation with patient history and  other diagnostic information is necessary to determine patient infection status.  The expected result is negative.  Fact Sheet for Patients:   https://www.patel.info/   Fact Sheet for Healthcare Providers:   https://hall.com/    This test is not yet approved or cleared by the [place] FDA and  has been authorized for detection and/or diagnosis of SARS-CoV-2 by FDA under an Emergency Use Authorization (EUA).  This EUA will remain in effect (meaning this test can be used) for the duration of  the COVID-19 declaration under Section 564(b)(1)  of the Act, 21 U.S.C. section 360-bbb-3(b)(1), unless the authorization is terminated or revoked sooner.   Performed at [facility], [place] [address], [place], [place] [address]   Blood culture (routine x 2)     Status: None (Preliminary result)   Collection Time: 03/23/22 10:03 PM   Specimen: BLOOD  Result Value Ref Range Status   Specimen Description BLOOD LEFT ANTECUBITAL  Final   Special Requests   Final    BOTTLES DRAWN AEROBIC AND ANAEROBIC Blood Culture adequate volume   Culture   Final    NO GROWTH 4 DAYS Performed at [facility], [place] [address] [place], [place] [address]    Report Status PENDING  Incomplete  Blood culture (routine x 2)     Status: None (Preliminary result)   Collection Time: 03/23/22 10:09 PM   Specimen: BLOOD  Result Value Ref Range  Status  Specimen Description BLOOD RIGHT ANTECUBITAL  Final   Special Requests   Final    BOTTLES DRAWN AEROBIC AND ANAEROBIC Blood Culture results may not be optimal due to an inadequate volume of blood received in culture bottles   Culture   Final    NO GROWTH 4 DAYS Performed at [facility], [place] [address], [place], [place] [address]    Report Status PENDING  Incomplete    RADIOLOGY STUDIES/RESULTS: No results found.   LOS: 3 days   Oren Binet, MD  [facility]    To contact the attending provider between 7A-7P or the covering provider during after hours 7P-7A, please log into the web site [email or web address] and access using universal [place] password for that web site. If you do not have the password, please call the hospital operator.  03/27/2022, 11:32 AM

## 2022-03-28 ENCOUNTER — Inpatient Hospital Stay (HOSPITAL_COMMUNITY): Payer: Medicare (Managed Care)

## 2022-03-28 DIAGNOSIS — J45909 Unspecified asthma, uncomplicated: Secondary | ICD-10-CM | POA: Diagnosis not present

## 2022-03-28 DIAGNOSIS — I10 Essential (primary) hypertension: Secondary | ICD-10-CM | POA: Diagnosis not present

## 2022-03-28 DIAGNOSIS — I5032 Chronic diastolic (congestive) heart failure: Secondary | ICD-10-CM | POA: Diagnosis not present

## 2022-03-28 DIAGNOSIS — U071 COVID-19: Secondary | ICD-10-CM | POA: Diagnosis not present

## 2022-03-28 LAB — HEMOGLOBIN A1C
Hgb A1c MFr Bld: 5.8 % — ABNORMAL HIGH (ref 4.8–5.6)
Mean Plasma Glucose: 119.76 mg/dL

## 2022-03-28 LAB — CBC WITH DIFFERENTIAL/PLATELET
Abs Immature Granulocytes: 0.15 10*3/uL — ABNORMAL HIGH (ref 0.00–0.07)
Basophils Absolute: 0 10*3/uL (ref 0.0–0.1)
Basophils Relative: 0 %
Eosinophils Absolute: 0 10*3/uL (ref 0.0–0.5)
Eosinophils Relative: 0 %
HCT: 40.7 % (ref 36.0–46.0)
Hemoglobin: 13.1 g/dL (ref 12.0–15.0)
Immature Granulocytes: 1 %
Lymphocytes Relative: 2 %
Lymphs Abs: 0.4 10*3/uL — ABNORMAL LOW (ref 0.7–4.0)
MCH: 29.3 pg (ref 26.0–34.0)
MCHC: 32.2 g/dL (ref 30.0–36.0)
MCV: 91.1 fL (ref 80.0–100.0)
Monocytes Absolute: 0.6 10*3/uL (ref 0.1–1.0)
Monocytes Relative: 4 %
Neutro Abs: 14.8 10*3/uL — ABNORMAL HIGH (ref 1.7–7.7)
Neutrophils Relative %: 93 %
Platelets: 192 10*3/uL (ref 150–400)
RBC: 4.47 MIL/uL (ref 3.87–5.11)
RDW: 16.5 % — ABNORMAL HIGH (ref 11.5–15.5)
WBC: 15.9 10*3/uL — ABNORMAL HIGH (ref 4.0–10.5)
nRBC: 0 % (ref 0.0–0.2)

## 2022-03-28 LAB — COMPREHENSIVE METABOLIC PANEL
ALT: 23 U/L (ref 0–44)
AST: 15 U/L (ref 15–41)
Albumin: 2.2 g/dL — ABNORMAL LOW (ref 3.5–5.0)
Alkaline Phosphatase: 48 U/L (ref 38–126)
Anion gap: 9 (ref 5–15)
BUN: 29 mg/dL — ABNORMAL HIGH (ref 8–23)
CO2: 25 mmol/L (ref 22–32)
Calcium: 8.2 mg/dL — ABNORMAL LOW (ref 8.9–10.3)
Chloride: 106 mmol/L (ref 98–111)
Creatinine, Ser: 0.69 mg/dL (ref 0.44–1.00)
GFR, Estimated: >60 mL/min (ref >60)
Glucose, Bld: 185 mg/dL — ABNORMAL HIGH (ref 70–99)
Potassium: 3.4 mmol/L — ABNORMAL LOW (ref 3.5–5.1)
Sodium: 140 mmol/L (ref 135–145)
Total Bilirubin: 0.4 mg/dL (ref 0.3–1.2)
Total Protein: 4.9 g/dL — ABNORMAL LOW (ref 6.5–8.1)

## 2022-03-28 LAB — GLUCOSE, CAPILLARY
Glucose-Capillary: 287 mg/dL — ABNORMAL HIGH (ref 70–99)
Glucose-Capillary: 314 mg/dL — ABNORMAL HIGH (ref 70–99)

## 2022-03-28 LAB — CULTURE, BLOOD (ROUTINE X 2)
Culture: NO GROWTH
Culture: NO GROWTH
Special Requests: ADEQUATE

## 2022-03-28 LAB — PROCALCITONIN: Procalcitonin: 0.33 ng/mL

## 2022-03-28 LAB — MAGNESIUM: Magnesium: 2.2 mg/dL (ref 1.7–2.4)

## 2022-03-28 LAB — C-REACTIVE PROTEIN: CRP: 11.1 mg/dL — ABNORMAL HIGH (ref <1.0)

## 2022-03-28 LAB — D-DIMER, QUANTITATIVE: D-Dimer, Quant: 0.94 ug/mL-FEU — ABNORMAL HIGH (ref 0.00–0.50)

## 2022-03-28 MED ORDER — MAGNESIUM HYDROXIDE 400 MG/5ML PO SUSP
30.0000 mL | Freq: Every day | ORAL | Status: AC | PRN
  Administered 2022-03-28 – 2022-04-02 (×2): 30 mL via ORAL
  Filled 2022-03-28 (×2): qty 30

## 2022-03-28 MED ORDER — PREDNISONE 20 MG PO TABS: 40.0000 mg | ORAL_TABLET | Freq: Every day | ORAL | Status: DC

## 2022-03-28 MED ORDER — BISACODYL 10 MG RE SUPP
10.0000 mg | Freq: Every day | RECTAL | Status: DC | PRN
  Filled 2022-03-28: qty 1

## 2022-03-28 MED ORDER — FUROSEMIDE 10 MG/ML IJ SOLN
40.0000 mg | Freq: Once | INTRAMUSCULAR | Status: AC
  Administered 2022-03-28: 40 mg via INTRAVENOUS
  Filled 2022-03-28: qty 4

## 2022-03-28 MED ORDER — SENNOSIDES-DOCUSATE SODIUM 8.6-50 MG PO TABS
2.0000 | ORAL_TABLET | Freq: Every day | ORAL | Status: DC
  Administered 2022-03-28 – 2022-04-03 (×7): 2 via ORAL
  Filled 2022-03-28 (×7): qty 2

## 2022-03-28 MED ORDER — INSULIN ASPART 100 UNIT/ML IJ SOLN
0.0000 [IU] | Freq: Every day | INTRAMUSCULAR | Status: DC
  Administered 2022-03-28: 4 [IU] via SUBCUTANEOUS
  Administered 2022-03-29 – 2022-03-30 (×2): 3 [IU] via SUBCUTANEOUS
  Administered 2022-04-01: 2 [IU] via SUBCUTANEOUS
  Administered 2022-04-02: 3 [IU] via SUBCUTANEOUS
  Administered 2022-04-04: 2 [IU] via SUBCUTANEOUS

## 2022-03-28 MED ORDER — INSULIN ASPART 100 UNIT/ML IJ SOLN
0.0000 [IU] | Freq: Three times a day (TID) | INTRAMUSCULAR | Status: DC
  Administered 2022-03-28: 5 [IU] via SUBCUTANEOUS
  Administered 2022-03-29: 1 [IU] via SUBCUTANEOUS
  Administered 2022-03-29: 2 [IU] via SUBCUTANEOUS
  Administered 2022-03-30: 3 [IU] via SUBCUTANEOUS
  Administered 2022-03-30: 2 [IU] via SUBCUTANEOUS
  Administered 2022-03-30: 5 [IU] via SUBCUTANEOUS
  Administered 2022-03-31: 2 [IU] via SUBCUTANEOUS
  Administered 2022-03-31: 1 [IU] via SUBCUTANEOUS
  Administered 2022-03-31: 5 [IU] via SUBCUTANEOUS
  Administered 2022-04-01: 3 [IU] via SUBCUTANEOUS
  Administered 2022-04-01: 7 [IU] via SUBCUTANEOUS
  Administered 2022-04-02: 1 [IU] via SUBCUTANEOUS
  Administered 2022-04-02: 5 [IU] via SUBCUTANEOUS
  Administered 2022-04-03: 3 [IU] via SUBCUTANEOUS
  Administered 2022-04-03: 1 [IU] via SUBCUTANEOUS
  Administered 2022-04-04: 2 [IU] via SUBCUTANEOUS
  Administered 2022-04-04: 1 [IU] via SUBCUTANEOUS
  Administered 2022-04-05: 2 [IU] via SUBCUTANEOUS

## 2022-03-28 MED ORDER — POLYETHYLENE GLYCOL 3350 17 G PO PACK
17.0000 g | PACK | Freq: Every day | ORAL | Status: DC
  Administered 2022-03-28 – 2022-04-04 (×8): 17 g via ORAL
  Filled 2022-03-28 (×7): qty 1

## 2022-03-28 MED ORDER — POTASSIUM CHLORIDE 20 MEQ PO PACK
40.0000 meq | PACK | Freq: Once | ORAL | Status: AC
  Administered 2022-03-28: 40 meq via ORAL
  Filled 2022-03-28: qty 2

## 2022-03-28 NOTE — Progress Notes (Signed)
PROGRESS NOTE        PATIENT DETAILS Name: Stacey Davenport Age: 81 y.o. Sex: female Date of Birth: [birth year] Admit Date: 03/23/2022 Admitting Physician Vianne Bulls, MD JAS:[id number], Angelyn Punt, MD  Brief Summary: Patient is a 81 y.o.  female with history of  asthma-on chronic steroids, HTN, HLD, CVA, dementia, PAF on Xarelto-who tested positive for COVID-19 at SNF on 8/23-presented to the hospital with worsening shortness of breath/fatigue-found to have acute hypoxic respiratory failure due to COVID-19 pneumonia and subsequently admitted to the hospitalist service.  See below for further details.  Significant events: 8/23>> COVID-19 positive at SNF 8/29>> admit-hypoxemia-COVID-19 pneumonia.  Significant studies: 8/29>> CXR: Airspace opacities in the right lower lung.  Significant microbiology data: 8/29>> COVID-19 PCR: Positive 8/29>> blood cultures: No growth  Procedures: None  Consults: None  Subjective: Stable on 2 L.  Complaining of constipation.  Objective: Vitals: Blood pressure 127/65, pulse 69, temperature 97.8 F (36.6 C), temperature source Oral, resp. rate 19, SpO2 90 %.   Exam: Gen Exam:Alert awake-not in any distress HEENT:atraumatic, normocephalic Chest: B/L clear to auscultation anteriorly CVS:S1S2 regular Abdomen:soft non tender, non distended Extremities:no edema Neurology: Non focal Skin: no rash   Pertinent Labs/Radiology:    Latest Ref Rng & Units 03/28/2022    3:50 AM 03/27/2022    5:52 AM 03/26/2022    8:08 AM  CBC  WBC 4.0 - 10.5 K/uL 15.9  19.1  19.5   Hemoglobin 12.0 - 15.0 g/dL 13.1  13.4  13.5   Hematocrit 36.0 - 46.0 % 40.7  40.8  40.7   Platelets 150 - 400 K/uL 192  165  182     Lab Results  Component Value Date   NA 140 03/28/2022   K 3.4 (L) 03/28/2022   CL 106 03/28/2022   CO2 25 03/28/2022      Assessment/Plan: Acute hypoxic respiratory failure due to COVID-19 pneumonia: Much improved-stable on  just 2 L of oxygen-CRP trending down-on oral steroids-no longer on Remdesivir.    Thrombocytopenia: Mild-due to COVID-resolved.   PAF: Maintaining sinus rhythm-continue amiodarone/Cardizem and Xarelto.  Chronic diastolic heart failure: Remains euvolemic-on IV Lasix to ensure negative balance.  Continue to follow electrolytes/intake/output closely.    Steroid-dependent asthma: Not in exacerbation-continue bronchodilators-on steroids chronically.  Dementia: Seems to be mildly/pleasantly confused-no family at bedside-but suspect not far from baseline.  Maintain delirium precautions.  GERD: Continue PPI  Constipation: MOM x1 today-starting scheduled MiraLAX/senna.  BMI: Estimated body mass index is 24.27 kg/m as calculated from the following:   Height as of 02/02/22: '5\' 3"'$  (1.6 m).   Weight as of 02/02/22: 62.1 kg.   Code status:   Code Status: Partial Code   DVT Prophylaxis:  rivaroxaban (XARELTO) tablet 20 mg     Family Communication: Son-David [phone]-updated over the phone on 8/31.   Disposition Plan: Status is: Inpatient Remains inpatient appropriate because: Improving hypoxemia-needs inpatient optimization before consideration of discharge.   Planned Discharge Destination:Skilled nursing facility in the next 1-2 days.   Diet: Diet Order             DIET SOFT Room service appropriate? Yes; Fluid consistency: Thin  Diet effective now                     Antimicrobial agents: Anti-infectives (From admission, onward)  Start     Dose/Rate Route Frequency Ordered Stop   03/24/22 1000  remdesivir 100 mg in sodium chloride 0.9 % 100 mL IVPB       See Hyperspace for full Linked Orders Report.   100 mg 200 mL/hr over 30 Minutes Intravenous Daily 03/23/22 2336 03/25/22 0942   03/24/22 0015  doxycycline (VIBRAMYCIN) 100 mg in sodium chloride 0.9 % 250 mL IVPB  Status:  Discontinued        100 mg 125 mL/hr over 120 Minutes Intravenous Every 12 hours  03/24/22 0008 03/24/22 0043   03/24/22 0000  doxycycline (VIBRA-TABS) tablet 100 mg  Status:  Discontinued        100 mg Oral  Once 03/23/22 2355 03/24/22 0005   03/23/22 2345  remdesivir 200 mg in sodium chloride 0.9% 250 mL IVPB       See Hyperspace for full Linked Orders Report.   200 mg 580 mL/hr over 30 Minutes Intravenous Once 03/23/22 2336 03/24/22 0247        MEDICATIONS: Scheduled Meds:  amiodarone  200 mg Oral Daily   arformoterol  15 mcg Nebulization BID   brimonidine  1 drop Left Eye TID   budesonide (PULMICORT) nebulizer solution  0.25 mg Nebulization BID   diltiazem  180 mg Oral BID   furosemide  40 mg Intravenous Daily   guaiFENesin  1,200 mg Oral BID   insulin aspart  0-5 Units Subcutaneous QHS   insulin aspart  0-9 Units Subcutaneous TID WC   irbesartan  37.5 mg Oral Daily   latanoprost  1 drop Both Eyes QHS   levothyroxine  75 mcg Oral QAC breakfast   pantoprazole  40 mg Oral BID   polyethylene glycol  17 g Oral Daily   predniSONE  50 mg Oral Daily   revefenacin  175 mcg Nebulization Daily   rivaroxaban  20 mg Oral Q supper   senna-docusate  2 tablet Oral QHS   sodium chloride flush  3 mL Intravenous Q12H   Continuous Infusions:   PRN Meds:.acetaminophen, bisacodyl, levalbuterol, LORazepam, magnesium hydroxide, polyvinyl alcohol, traMADol   I have personally reviewed following labs and imaging studies  LABORATORY DATA: CBC: Recent Labs  Lab 03/23/22 2203 03/25/22 0454 03/26/22 0808 03/27/22 0552 03/28/22 0350  WBC 8.6 10.3 19.5* 19.1* 15.9*  NEUTROABS 7.4 9.3* 18.3* 17.7* 14.8*  HGB 14.1 14.7 13.5 13.4 13.1  HCT 42.9 44.0 40.7 40.8 40.7  MCV 91.3 89.2 90.4 90.1 91.1  PLT 118* 156 182 165 192     Basic Metabolic Panel: Recent Labs  Lab 03/23/22 2203 03/24/22 0200 03/25/22 0454 03/26/22 0808 03/27/22 0552 03/28/22 0350  NA 136  --  138 139 143 140  K 4.1  --  3.5 3.8 3.6 3.4*  CL 105  --  105 106 107 106  CO2 22  --  '22 25 23 25   '$ GLUCOSE 93  --  202* 182* 200* 185*  BUN 14  --  24* 33* 34* 29*  CREATININE 0.82  --  0.73 0.81 0.77 0.69  CALCIUM 8.3*  --  8.2* 8.3* 8.6* 8.2*  MG  --  1.9  --   --   --  2.2  PHOS  --  3.3  --   --   --   --      GFR: CrCl cannot be calculated (Unknown ideal weight.).  Liver Function Tests: Recent Labs  Lab 03/23/22 2203 03/25/22 3244 03/26/22 0102 03/27/22 7253 03/28/22 0350  AST '30 26 17 17 15  '$ ALT '31 26 22 22 23  '$ ALKPHOS 45 42 43 47 48  BILITOT 0.7 0.5 0.7 0.4 0.4  PROT 5.9* 5.2* 5.0* 5.1* 4.9*  ALBUMIN 3.0* 2.3* 2.3* 2.3* 2.2*    No results for input(s): "LIPASE", "AMYLASE" in the last 168 hours. No results for input(s): "AMMONIA" in the last 168 hours.  Coagulation Profile: No results for input(s): "INR", "PROTIME" in the last 168 hours.  Cardiac Enzymes: No results for input(s): "CKTOTAL", "CKMB", "CKMBINDEX", "TROPONINI" in the last 168 hours.  BNP (last 3 results) No results for input(s): "PROBNP" in the last 8760 hours.  Lipid Profile: No results for input(s): "CHOL", "HDL", "LDLCALC", "TRIG", "CHOLHDL", "LDLDIRECT" in the last 72 hours.  Thyroid Function Tests: No results for input(s): "TSH", "T4TOTAL", "FREET4", "T3FREE", "THYROIDAB" in the last 72 hours.  Anemia Panel: No results for input(s): "VITAMINB12", "FOLATE", "FERRITIN", "TIBC", "IRON", "RETICCTPCT" in the last 72 hours.  Urine analysis:    Component Value Date/Time   COLORURINE AMBER (A) 10/25/2021 1737   APPEARANCEUR TURBID (A) 10/25/2021 1737   LABSPEC 1.021 10/25/2021 1737   PHURINE 7.0 10/25/2021 1737   GLUCOSEU NEGATIVE 10/25/2021 1737   HGBUR SMALL (A) 10/25/2021 1737   BILIRUBINUR NEGATIVE 10/25/2021 1737   KETONESUR NEGATIVE 10/25/2021 1737   PROTEINUR 100 (A) 10/25/2021 1737   UROBILINOGEN 0.2 04/20/2012 1605   NITRITE NEGATIVE 10/25/2021 1737   LEUKOCYTESUR LARGE (A) 10/25/2021 1737    Sepsis Labs: Lactic Acid, Venous    Component Value Date/Time   LATICACIDVEN  1.1 03/24/2022 0200    MICROBIOLOGY: Recent Results (from the past 240 hour(s))  SARS Coronavirus 2 by RT PCR (hospital order, performed in [facility] hospital lab) *cepheid single result test* Anterior Nasal Swab     Status: Abnormal   Collection Time: 03/23/22  9:56 PM   Specimen: Anterior Nasal Swab  Result Value Ref Range Status   SARS Coronavirus 2 by RT PCR POSITIVE (A) NEGATIVE Final    Comment: (NOTE) SARS-CoV-2 target nucleic acids are DETECTED  SARS-CoV-2 RNA is generally detectable in upper respiratory specimens  during the acute phase of infection.  Positive results are indicative  of the presence of the identified virus, but do not rule out bacterial infection or co-infection with other pathogens not detected by the test.  Clinical correlation with patient history and  other diagnostic information is necessary to determine patient infection status.  The expected result is negative.  Fact Sheet for Patients:   https://www.patel.info/   Fact Sheet for Healthcare Providers:   https://hall.com/    This test is not yet approved or cleared by the [place] FDA and  has been authorized for detection and/or diagnosis of SARS-CoV-2 by FDA under an Emergency Use Authorization (EUA).  This EUA will remain in effect (meaning this test can be used) for the duration of  the COVID-19 declaration under Section 564(b)(1)  of the Act, 21 U.S.C. section 360-bbb-3(b)(1), unless the authorization is terminated or revoked sooner.   Performed at [facility], [place] [address], [place], [place] [address]   Blood culture (routine x 2)     Status: None   Collection Time: 03/23/22 10:03 PM   Specimen: BLOOD  Result Value Ref Range Status   Specimen Description BLOOD LEFT ANTECUBITAL  Final   Special Requests   Final    BOTTLES DRAWN AEROBIC AND ANAEROBIC Blood Culture adequate volume   Culture   Final    NO  GROWTH 5 DAYS Performed  at [facility], [place] [address] [place], [place] [address]    Report Status 03/28/2022 FINAL  Final  Blood culture (routine x 2)     Status: None   Collection Time: 03/23/22 10:09 PM   Specimen: BLOOD  Result Value Ref Range Status   Specimen Description BLOOD RIGHT ANTECUBITAL  Final   Special Requests   Final    BOTTLES DRAWN AEROBIC AND ANAEROBIC Blood Culture results may not be optimal due to an inadequate volume of blood received in culture bottles   Culture   Final    NO GROWTH 5 DAYS Performed at [facility], [place] [address] [place], [place] [address]    Report Status 03/28/2022 FINAL  Final    RADIOLOGY STUDIES/RESULTS: No results found.   LOS: 4 days   Oren Binet, MD  [facility]    To contact the attending provider between 7A-7P or the covering provider during after hours 7P-7A, please log into the web site [email or web address] and access using universal [place] password for that web site. If you do not have the password, please call the hospital operator.  03/28/2022, 2:09 PM

## 2022-03-29 ENCOUNTER — Inpatient Hospital Stay (HOSPITAL_COMMUNITY): Payer: Medicare (Managed Care)

## 2022-03-29 DIAGNOSIS — U071 COVID-19: Secondary | ICD-10-CM | POA: Diagnosis not present

## 2022-03-29 DIAGNOSIS — J45909 Unspecified asthma, uncomplicated: Secondary | ICD-10-CM | POA: Diagnosis not present

## 2022-03-29 DIAGNOSIS — I5032 Chronic diastolic (congestive) heart failure: Secondary | ICD-10-CM | POA: Diagnosis not present

## 2022-03-29 DIAGNOSIS — I10 Essential (primary) hypertension: Secondary | ICD-10-CM | POA: Diagnosis not present

## 2022-03-29 LAB — COMPREHENSIVE METABOLIC PANEL
ALT: 32 U/L (ref 0–44)
AST: 19 U/L (ref 15–41)
Albumin: 2.2 g/dL — ABNORMAL LOW (ref 3.5–5.0)
Alkaline Phosphatase: 48 U/L (ref 38–126)
Anion gap: 8 (ref 5–15)
BUN: 26 mg/dL — ABNORMAL HIGH (ref 8–23)
CO2: 28 mmol/L (ref 22–32)
Calcium: 8.3 mg/dL — ABNORMAL LOW (ref 8.9–10.3)
Chloride: 107 mmol/L (ref 98–111)
Creatinine, Ser: 0.67 mg/dL (ref 0.44–1.00)
GFR, Estimated: >60 mL/min (ref >60)
Glucose, Bld: 130 mg/dL — ABNORMAL HIGH (ref 70–99)
Potassium: 4 mmol/L (ref 3.5–5.1)
Sodium: 143 mmol/L (ref 135–145)
Total Bilirubin: 0.5 mg/dL (ref 0.3–1.2)
Total Protein: 4.9 g/dL — ABNORMAL LOW (ref 6.5–8.1)

## 2022-03-29 LAB — CBC WITH DIFFERENTIAL/PLATELET
Abs Immature Granulocytes: 0.28 10*3/uL — ABNORMAL HIGH (ref 0.00–0.07)
Basophils Absolute: 0 10*3/uL (ref 0.0–0.1)
Basophils Relative: 0 %
Eosinophils Absolute: 0 10*3/uL (ref 0.0–0.5)
Eosinophils Relative: 0 %
HCT: 40.6 % (ref 36.0–46.0)
Hemoglobin: 13.6 g/dL (ref 12.0–15.0)
Immature Granulocytes: 2 %
Lymphocytes Relative: 3 %
Lymphs Abs: 0.5 10*3/uL — ABNORMAL LOW (ref 0.7–4.0)
MCH: 30.3 pg (ref 26.0–34.0)
MCHC: 33.5 g/dL (ref 30.0–36.0)
MCV: 90.4 fL (ref 80.0–100.0)
Monocytes Absolute: 0.7 10*3/uL (ref 0.1–1.0)
Monocytes Relative: 4 %
Neutro Abs: 16 10*3/uL — ABNORMAL HIGH (ref 1.7–7.7)
Neutrophils Relative %: 91 %
Platelets: 199 10*3/uL (ref 150–400)
RBC: 4.49 MIL/uL (ref 3.87–5.11)
RDW: 16.5 % — ABNORMAL HIGH (ref 11.5–15.5)
WBC: 17.5 10*3/uL — ABNORMAL HIGH (ref 4.0–10.5)
nRBC: 0 % (ref 0.0–0.2)

## 2022-03-29 LAB — C-REACTIVE PROTEIN: CRP: 18.8 mg/dL — ABNORMAL HIGH (ref <1.0)

## 2022-03-29 LAB — MAGNESIUM: Magnesium: 2.2 mg/dL (ref 1.7–2.4)

## 2022-03-29 LAB — BRAIN NATRIURETIC PEPTIDE: B Natriuretic Peptide: 126.7 pg/mL — ABNORMAL HIGH (ref 0.0–100.0)

## 2022-03-29 LAB — GLUCOSE, CAPILLARY
Glucose-Capillary: 127 mg/dL — ABNORMAL HIGH (ref 70–99)
Glucose-Capillary: 112 mg/dL — ABNORMAL HIGH (ref 70–99)
Glucose-Capillary: 190 mg/dL — ABNORMAL HIGH (ref 70–99)
Glucose-Capillary: 259 mg/dL — ABNORMAL HIGH (ref 70–99)

## 2022-03-29 MED ORDER — DIPHENHYDRAMINE HCL 25 MG PO CAPS: 25.0000 mg | ORAL_CAPSULE | Freq: Once | ORAL | Status: DC | PRN

## 2022-03-29 MED ORDER — LEVALBUTEROL HCL 0.63 MG/3ML IN NEBU
0.6300 mg | INHALATION_SOLUTION | Freq: Four times a day (QID) | RESPIRATORY_TRACT | Status: DC
Start: 2022-03-29 — End: 2022-03-30
  Administered 2022-03-29 – 2022-03-30 (×2): 0.63 mg via RESPIRATORY_TRACT
  Filled 2022-03-29 (×6): qty 3

## 2022-03-29 MED ORDER — SODIUM CHLORIDE 0.9 % IV SOLN
1.5000 g | Freq: Four times a day (QID) | INTRAVENOUS | Status: AC
  Administered 2022-03-29 – 2022-04-03 (×19): 1.5 g via INTRAVENOUS
  Filled 2022-03-29 (×21): qty 4

## 2022-03-29 MED ORDER — METHYLPREDNISOLONE SODIUM SUCC 125 MG IJ SOLR
120.0000 mg | INTRAMUSCULAR | Status: DC
Start: 2022-03-29 — End: 2022-04-01
  Administered 2022-03-29 – 2022-04-01 (×4): 120 mg via INTRAVENOUS
  Filled 2022-03-29 (×5): qty 2

## 2022-03-29 NOTE — Progress Notes (Addendum)
Pharmacy Antibiotic Note  Stacey Davenport is a 81 y.o. female admitted on 03/23/2022 for COVID-19 pneumonia. Initial infection improved with remdesevir. Today pt has increased infectious symptoms with elevated oxygen demand and was diagnosed with aspiration pneumonia. Pharmacy has been consulted for Unasyn (ampicillin-sulbactam) dosing.   Notably, patient has an allergy to amoxicillin and cephalexin listed. When the nurse discussed with patient today, she was unaware of this reaction. Per visit in 10/10/2011, reaction to cephalexin was itching and rash. Pt given Zosyn during that admission with no reaction. Discussed with Dr.Ghimire and ok with Unasyn at this time.   Plan: Initiate Unasyn 1.5g IV Q6h Monitor closely for adverse reaction Monitor renal function periodically. Monitor for resolution of infection.    Temp (24hrs), Avg:98.2 F (36.8 C), Min:97.9 F (36.6 C), Max:98.5 F (36.9 C)  Recent Labs  Lab 03/23/22 2203 03/24/22 0200 03/25/22 0454 03/26/22 0808 03/27/22 0552 03/28/22 0350 03/29/22 0334  WBC 8.6  --  10.3 19.5* 19.1* 15.9* 17.5*  CREATININE 0.82  --  0.73 0.81 0.77 0.69 0.67  LATICACIDVEN 1.2 1.1  --   --   --   --   --      CrCl cannot be calculated (Unknown ideal weight.).    Allergies  Allergen Reactions   Sulfamethoxazole Nausea And Vomiting    Bladder infection NOT ON MAR   Aciphex [Rabeprazole Sodium]     Unknown reaction   Amoxicillin     Unknown reaction    Biaxin [Clarithromycin] Hives and Itching   Cephalexin     Unknown reaction   Hydrocodone     NOT ON MAR    Ketek [Telithromycin]     NOT ON MAR   Latex     Unknown reaction    Levaquin [Levofloxacin In D5w]     Unknown reaction    Lipitor [Atorvastatin]     Unknown reaction    Macrobid [Nitrofurantoin Macrocrystal]     NOT ON MAR   Moxifloxacin     NOT ON MAR   Nexium [Esomeprazole Magnesium]    Sulfonamide Derivatives     NOT ON MAR    Vesicare [Solifenacin]     NOT ON  MAR   Zithromax [Azithromycin]     NOT ON MAR     Antimicrobials this admission: Unasyn 9/4 >>  Remdesivir 8/29 >> 9/1  Dose adjustments this admission: N/a  Microbiology results: 8/29 BCx: negative 8/29 COVID PCR Positive  Thank you for allowing pharmacy to be a part of this patient's care.  Titus Dubin, PharmD PGY1 Pharmacy Resident 03/29/2022 1:41 PM

## 2022-03-29 NOTE — Progress Notes (Signed)
Speech Language Pathology Treatment: Dysphagia  Patient Details Name: Stacey Davenport MRN: [id number] DOB: 02/09/1941 Today's Date: 03/29/2022 Time: 0076-2263 SLP Time Calculation (min) (ACUTE ONLY): 16 min  Assessment / Plan / Recommendation Clinical Impression  F/u for swallowing.  Stacey Davenport presents with worsening hypoxemia, concerning for aspiration per MD notes.  Cough is wet today; she is weaker.  Encountered Stacey Davenport with non-rebreather in her lap, SP02 83%. Helped her to place oxygen mask back on - Sp02 rose to 95% and remained there.  Speech was very low volume and difficult to understand; she appeared sleepy. Was amenable to drinking some liquids and allowed three bites of applesauce, but then declined anything further. There were no overt s/s of aspiration observed while consuming POs. Oral attention and manipulation of solids was WNL. There was no coughing associated with drinking liquids.  Doubt she is aspirating with meals as long as she is alert.  D/W RN.  Recommend continuing current diet diet as able - Stacey Davenport will need assistance with feeding herself meals.  Hold tray if not sufficiently alert.    HPI HPI: Patient is an 81 y.o. female with PMH: large hiatal hernia, dementia, HTN, HLD, CVA, asthma on chronic steroids, PAF on Xarelto. She presentedd to thehe hospital from her SNF (she [facility] resident at [facility]) with worsening SOB and fatigue and found to have hypoxic respiratory failure due to Covid-19 PNA. CXR showed airspace opacities in the right lung which may be related to edema or infectious etiology. Patient had EGD in March of 2023 which showed a large hiatal hernia and multiple hemorrhagic gastric polyps.      SLP Plan  Continue with current plan of care      Recommendations for follow up therapy are one component of a multi-disciplinary discharge planning process, led by the attending physician.  Recommendations may be updated based on patient status, additional  functional criteria and insurance authorization.    Recommendations  Diet recommendations: Dysphagia 3 (mechanical soft);Thin liquid Liquids provided via: Cup;Straw Medication Administration: Whole meds with puree Supervision: Staff to assist with self feeding Compensations: Slow rate;Small sips/bites Postural Changes and/or Swallow Maneuvers: Seated upright 90 degrees;Upright 30-60 min after meal                Oral Care Recommendations: Oral care BID Follow Up Recommendations: No SLP follow up Assistance recommended at discharge: Intermittent Supervision/Assistance SLP Visit Diagnosis: Dysphagia, oral phase (R13.11) Plan: Continue with current plan of care         Stacey Davenport Ringer, MA CCC/SLP Clinical Specialist - Acute Care SLP Acute Rehabilitation Services Office number [phone]   Stacey Davenport  03/29/2022, 2:55 PM

## 2022-03-29 NOTE — Progress Notes (Addendum)
PROGRESS NOTE        PATIENT DETAILS Name: Stacey Davenport Age: 81 y.o. Sex: female Date of Birth: 1941-02-10 Admit Date: 03/23/2022 Admitting Physician Vianne Bulls, MD NGE:[id number], Angelyn Punt, MD  Brief Summary: Patient is a 81 y.o.  female with history of  asthma-on chronic steroids, HTN, HLD, CVA, dementia, PAF on Xarelto-who tested positive for COVID-19 at SNF on 8/23-presented to the hospital with worsening shortness of breath/fatigue-found to have acute hypoxic respiratory failure due to COVID-19 pneumonia and subsequently admitted to the hospitalist service.  See below for further details.  Significant events: 8/23>> COVID-19 positive at SNF 8/29>> admit-hypoxemia-COVID-19 pneumonia. 9/03>> worsening hypoxemia-suspicion for aspiration.  Significant studies: 8/29>> CXR: Airspace opacities in the right lower lung.  Significant microbiology data: 8/29>> COVID-19 PCR: Positive 8/29>> blood cultures: No growth  Procedures: None  Consults: None  Subjective: Wet cough this morning-appears remarkably much more weaker than just yesterday.  Gurgling at times.  Objective: Vitals: Blood pressure (!) 118/58, pulse 70, temperature 98.1 F (36.7 C), resp. rate 20, SpO2 95 %.   Exam: Gen Exam: Sleepy this morning-looks very frail.  Not in any distress. HEENT:atraumatic, normocephalic Chest: Bibasilar rales. CVS:S1S2 regular Abdomen:soft non tender, non distended Extremities:no edema Neurology: Non focal Skin: no rash   Pertinent Labs/Radiology:    Latest Ref Rng & Units 03/29/2022    3:34 AM 03/28/2022    3:50 AM 03/27/2022    5:52 AM  CBC  WBC 4.0 - 10.5 K/uL 17.5  15.9  19.1   Hemoglobin 12.0 - 15.0 g/dL 13.6  13.1  13.4   Hematocrit 36.0 - 46.0 % 40.6  40.7  40.8   Platelets 150 - 400 K/uL 199  192  165     Lab Results  Component Value Date   NA 143 03/29/2022   K 4.0 03/29/2022   CL 107 03/29/2022   CO2 28 03/29/2022       Assessment/Plan: Acute hypoxic respiratory failure due to COVID-19 pneumonia-and  superimposed aspiration pneumonia: Worsening hypoxemia overnight-suspect may have aspirated some of her oral secretions-starting Unasyn-switch back to IV steroids-maintain aspiration precautions-SLP eval.  She appears very weak/debilitated compared to yesterday-remains at significant risk for ongoing aspiration episodes.  Discussed with patient-she reconfirms she is a DO NOT INTUBATE-subsequently discussed with son Shanon Brow over the phone.  All aware that if she were to deteriorate any further-we are looking at end-of-life-hospice care.  Will go ahead and consult palliative care.    Thrombocytopenia: Mild-due to COVID-resolved.   PAF: Maintaining sinus rhythm-continue amiodarone/Cardizem and Xarelto.  Chronic diastolic heart failure: Remains euvolemic-continue IV Lasix to ensure negative balance.  Steroid-dependent asthma: Not in exacerbation-continue bronchodilators-on steroids chronically.  Dementia: Seems to be mildly/pleasantly confused-no family at bedside-but suspect not far from baseline.  Maintain delirium precautions.  GERD: Continue PPI  Constipation: MOM x1 today-starting scheduled MiraLAX/senna.  Palliative care: DNI-in place-reconfirmed with patient this morning and subsequently with the patient's son.  Hope with supportive care that she would improve-otherwise family is well aware that apart from initiation of hospice/end-of-life care no other options at this point.  Palliative care also consulted.  Patient remains very tenuous.  BMI: Estimated body mass index is 24.27 kg/m as calculated from the following:   Height as of 02/02/22: '5\' 3"'$  (1.6 m).   Weight as of 02/02/22: 62.1 kg.   Code  status:   Code Status: Partial Code   DVT Prophylaxis:  rivaroxaban (XARELTO) tablet 20 mg     Family Communication: Son-David [phone]-updated over the phone on 9/4-another son was at bedside  earlier this afternoon.   Disposition Plan: Status is: Inpatient Remains inpatient appropriate because: Improving hypoxemia-needs inpatient optimization before consideration of discharge.   Planned Discharge Destination:Skilled nursing facility in the next 1-2 days.   Diet: Diet Order             DIET SOFT Room service appropriate? Yes; Fluid consistency: Thin  Diet effective now                     Antimicrobial agents: Anti-infectives (From admission, onward)    Start     Dose/Rate Route Frequency Ordered Stop   03/24/22 1000  remdesivir 100 mg in sodium chloride 0.9 % 100 mL IVPB       See Hyperspace for full Linked Orders Report.   100 mg 200 mL/hr over 30 Minutes Intravenous Daily 03/23/22 2336 03/25/22 0942   03/24/22 0015  doxycycline (VIBRAMYCIN) 100 mg in sodium chloride 0.9 % 250 mL IVPB  Status:  Discontinued        100 mg 125 mL/hr over 120 Minutes Intravenous Every 12 hours 03/24/22 0008 03/24/22 0043   03/24/22 0000  doxycycline (VIBRA-TABS) tablet 100 mg  Status:  Discontinued        100 mg Oral  Once 03/23/22 2355 03/24/22 0005   03/23/22 2345  remdesivir 200 mg in sodium chloride 0.9% 250 mL IVPB       See Hyperspace for full Linked Orders Report.   200 mg 580 mL/hr over 30 Minutes Intravenous Once 03/23/22 2336 03/24/22 0247        MEDICATIONS: Scheduled Meds:  amiodarone  200 mg Oral Daily   arformoterol  15 mcg Nebulization BID   brimonidine  1 drop Left Eye TID   budesonide (PULMICORT) nebulizer solution  0.25 mg Nebulization BID   diltiazem  180 mg Oral BID   furosemide  40 mg Intravenous Daily   guaiFENesin  1,200 mg Oral BID   insulin aspart  0-5 Units Subcutaneous QHS   insulin aspart  0-9 Units Subcutaneous TID WC   irbesartan  37.5 mg Oral Daily   latanoprost  1 drop Both Eyes QHS   levalbuterol  0.63 mg Inhalation Q6H   levothyroxine  75 mcg Oral QAC breakfast   methylPREDNISolone (SOLU-MEDROL) injection  120 mg Intravenous Q24H    pantoprazole  40 mg Oral BID   polyethylene glycol  17 g Oral Daily   revefenacin  175 mcg Nebulization Daily   rivaroxaban  20 mg Oral Q supper   senna-docusate  2 tablet Oral QHS   sodium chloride flush  3 mL Intravenous Q12H   Continuous Infusions:   PRN Meds:.acetaminophen, bisacodyl, LORazepam, magnesium hydroxide, polyvinyl alcohol, traMADol   I have personally reviewed following labs and imaging studies  LABORATORY DATA: CBC: Recent Labs  Lab 03/25/22 0454 03/26/22 0808 03/27/22 0552 03/28/22 0350 03/29/22 0334  WBC 10.3 19.5* 19.1* 15.9* 17.5*  NEUTROABS 9.3* 18.3* 17.7* 14.8* 16.0*  HGB 14.7 13.5 13.4 13.1 13.6  HCT 44.0 40.7 40.8 40.7 40.6  MCV 89.2 90.4 90.1 91.1 90.4  PLT 156 182 165 192 199     Basic Metabolic Panel: Recent Labs  Lab 03/24/22 0200 03/25/22 0454 03/26/22 0808 03/27/22 0552 03/28/22 0350 03/29/22 0334  NA  --  138 139 143  140 143  K  --  3.5 3.8 3.6 3.4* 4.0  CL  --  105 106 107 106 107  CO2  --  '22 25 23 25 28  '$ GLUCOSE  --  202* 182* 200* 185* 130*  BUN  --  24* 33* 34* 29* 26*  CREATININE  --  0.73 0.81 0.77 0.69 0.67  CALCIUM  --  8.2* 8.3* 8.6* 8.2* 8.3*  MG 1.9  --   --   --  2.2 2.2  PHOS 3.3  --   --   --   --   --      GFR: CrCl cannot be calculated (Unknown ideal weight.).  Liver Function Tests: Recent Labs  Lab 03/25/22 0454 03/26/22 0808 03/27/22 0552 03/28/22 0350 03/29/22 0334  AST '26 17 17 15 19  '$ ALT '26 22 22 23 '$ 32  ALKPHOS 42 43 47 48 48  BILITOT 0.5 0.7 0.4 0.4 0.5  PROT 5.2* 5.0* 5.1* 4.9* 4.9*  ALBUMIN 2.3* 2.3* 2.3* 2.2* 2.2*    No results for input(s): "LIPASE", "AMYLASE" in the last 168 hours. No results for input(s): "AMMONIA" in the last 168 hours.  Coagulation Profile: No results for input(s): "INR", "PROTIME" in the last 168 hours.  Cardiac Enzymes: No results for input(s): "CKTOTAL", "CKMB", "CKMBINDEX", "TROPONINI" in the last 168 hours.  BNP (last 3 results) No results for  input(s): "PROBNP" in the last 8760 hours.  Lipid Profile: No results for input(s): "CHOL", "HDL", "LDLCALC", "TRIG", "CHOLHDL", "LDLDIRECT" in the last 72 hours.  Thyroid Function Tests: No results for input(s): "TSH", "T4TOTAL", "FREET4", "T3FREE", "THYROIDAB" in the last 72 hours.  Anemia Panel: No results for input(s): "VITAMINB12", "FOLATE", "FERRITIN", "TIBC", "IRON", "RETICCTPCT" in the last 72 hours.  Urine analysis:    Component Value Date/Time   COLORURINE AMBER (A) 10/25/2021 1737   APPEARANCEUR TURBID (A) 10/25/2021 1737   LABSPEC 1.021 10/25/2021 1737   PHURINE 7.0 10/25/2021 1737   GLUCOSEU NEGATIVE 10/25/2021 1737   HGBUR SMALL (A) 10/25/2021 1737   BILIRUBINUR NEGATIVE 10/25/2021 1737   KETONESUR NEGATIVE 10/25/2021 1737   PROTEINUR 100 (A) 10/25/2021 1737   UROBILINOGEN 0.2 04/20/2012 1605   NITRITE NEGATIVE 10/25/2021 1737   LEUKOCYTESUR LARGE (A) 10/25/2021 1737    Sepsis Labs: Lactic Acid, Venous    Component Value Date/Time   LATICACIDVEN 1.1 03/24/2022 0200    MICROBIOLOGY: Recent Results (from the past 240 hour(s))  SARS Coronavirus 2 by RT PCR (hospital order, performed in [place] hospital lab) *cepheid single result test* Anterior Nasal Swab     Status: Abnormal   Collection Time: 03/23/22  9:56 PM   Specimen: Anterior Nasal Swab  Result Value Ref Range Status   SARS Coronavirus 2 by RT PCR POSITIVE (A) NEGATIVE Final    Comment: (NOTE) SARS-CoV-2 target nucleic acids are DETECTED  SARS-CoV-2 RNA is generally detectable in upper respiratory specimens  during the acute phase of infection.  Positive results are indicative  of the presence of the identified virus, but do not rule out bacterial infection or co-infection with other pathogens not detected by the test.  Clinical correlation with patient history and  other diagnostic information is necessary to determine patient infection status.  The expected result is negative.  Fact Sheet  for Patients:   https://www.patel.info/   Fact Sheet for Healthcare Providers:   https://hall.com/    This test is not yet approved or cleared by the [place] FDA and  has been authorized for  detection and/or diagnosis of SARS-CoV-2 by FDA under an Emergency Use Authorization (EUA).  This EUA will remain in effect (meaning this test can be used) for the duration of  the COVID-19 declaration under Section 564(b)(1)  of the Act, 21 U.S.C. section 360-bbb-3(b)(1), unless the authorization is terminated or revoked sooner.   Performed at [facility], [place] [address] [place], [place] [address]   Blood culture (routine x 2)     Status: None   Collection Time: 03/23/22 10:03 PM   Specimen: BLOOD  Result Value Ref Range Status   Specimen Description BLOOD LEFT ANTECUBITAL  Final   Special Requests   Final    BOTTLES DRAWN AEROBIC AND ANAEROBIC Blood Culture adequate volume   Culture   Final    NO GROWTH 5 DAYS Performed at [facility], [place] [address] [place], [place] [address]    Report Status 03/28/2022 FINAL  Final  Blood culture (routine x 2)     Status: None   Collection Time: 03/23/22 10:09 PM   Specimen: BLOOD  Result Value Ref Range Status   Specimen Description BLOOD RIGHT ANTECUBITAL  Final   Special Requests   Final    BOTTLES DRAWN AEROBIC AND ANAEROBIC Blood Culture results may not be optimal due to an inadequate volume of blood received in culture bottles   Culture   Final    NO GROWTH 5 DAYS Performed at [facility], [place] [address], [place], [place] [address]    Report Status 03/28/2022 FINAL  Final    RADIOLOGY STUDIES/RESULTS: DG Chest Port 1V same Day  Result Date: 03/29/2022 CLINICAL DATA:  Shortness of breath. EXAM: PORTABLE CHEST 1 VIEW COMPARISON:  03/28/2022 FINDINGS: Patchy bilateral airspace disease again noted, progressive in the right upper lobe. Small bilateral pleural effusions  persist. Cardiopericardial silhouette is at upper limits of normal for size. Bones are diffusely demineralized. Telemetry leads overlie the chest. IMPRESSION: 1. Patchy bilateral airspace disease, progressive in the right upper lobe. Findings could reflect asymmetric edema or diffuse infection. 2. Small bilateral pleural effusions. Electronically Signed   By: Misty Stanley M.D.   On: 03/29/2022 08:30   DG Chest Port 1V same Day  Result Date: 03/28/2022 CLINICAL DATA:  Shortness of breath, cough EXAM: PORTABLE CHEST 1 VIEW COMPARISON:  Previous studies including the examination of 03/23/2022 FINDINGS: Transverse diameter of heart is increased. Increased interstitial and alveolar densities are seen in parahilar regions with interval worsening in the left parahilar region. Small bilateral pleural effusions are seen with possible increased. There is no pneumothorax. IMPRESSION: Increased interstitial and alveolar markings are seen in both lungs, more so in the left parahilar region. Findings suggest CHF with pulmonary edema. Possibility of multifocal pneumonia is not excluded. Small bilateral pleural effusions with interval increase. Electronically Signed   By: Elmer Picker M.D.   On: 03/28/2022 16:06     LOS: 5 days   Oren Binet, MD  [facility]    To contact the attending provider between 7A-7P or the covering provider during after hours 7P-7A, please log into the web site [email or web address] and access using universal [place] password for that web site. If you do not have the password, please call the hospital operator.  03/29/2022, 1:43 PM

## 2022-03-30 DIAGNOSIS — Z515 Encounter for palliative care: Secondary | ICD-10-CM | POA: Diagnosis not present

## 2022-03-30 DIAGNOSIS — U071 COVID-19: Secondary | ICD-10-CM | POA: Diagnosis not present

## 2022-03-30 DIAGNOSIS — J45909 Unspecified asthma, uncomplicated: Secondary | ICD-10-CM | POA: Diagnosis not present

## 2022-03-30 DIAGNOSIS — I10 Essential (primary) hypertension: Secondary | ICD-10-CM | POA: Diagnosis not present

## 2022-03-30 DIAGNOSIS — I5032 Chronic diastolic (congestive) heart failure: Secondary | ICD-10-CM | POA: Diagnosis not present

## 2022-03-30 LAB — BASIC METABOLIC PANEL
Anion gap: 11 (ref 5–15)
BUN: 23 mg/dL (ref 8–23)
CO2: 31 mmol/L (ref 22–32)
Calcium: 8.4 mg/dL — ABNORMAL LOW (ref 8.9–10.3)
Chloride: 104 mmol/L (ref 98–111)
Creatinine, Ser: 0.68 mg/dL (ref 0.44–1.00)
GFR, Estimated: >60 mL/min (ref >60)
Glucose, Bld: 179 mg/dL — ABNORMAL HIGH (ref 70–99)
Potassium: 3.8 mmol/L (ref 3.5–5.1)
Sodium: 146 mmol/L — ABNORMAL HIGH (ref 135–145)

## 2022-03-30 LAB — GLUCOSE, CAPILLARY
Glucose-Capillary: 180 mg/dL — ABNORMAL HIGH (ref 70–99)
Glucose-Capillary: 239 mg/dL — ABNORMAL HIGH (ref 70–99)
Glucose-Capillary: 268 mg/dL — ABNORMAL HIGH (ref 70–99)
Glucose-Capillary: 255 mg/dL — ABNORMAL HIGH (ref 70–99)

## 2022-03-30 MED ORDER — LEVALBUTEROL HCL 0.63 MG/3ML IN NEBU
0.6300 mg | INHALATION_SOLUTION | Freq: Four times a day (QID) | RESPIRATORY_TRACT | Status: DC | PRN
  Filled 2022-03-30: qty 3

## 2022-03-30 MED ORDER — ONDANSETRON HCL 4 MG/2ML IJ SOLN
4.0000 mg | Freq: Four times a day (QID) | INTRAMUSCULAR | Status: DC | PRN
  Administered 2022-03-30 – 2022-04-04 (×3): 4 mg via INTRAVENOUS
  Filled 2022-03-30 (×3): qty 2

## 2022-03-30 MED ORDER — ONDANSETRON HCL 4 MG/2ML IJ SOLN: 4.0000 mg | Freq: Four times a day (QID) | INTRAMUSCULAR | Status: DC

## 2022-03-30 NOTE — Progress Notes (Signed)
Pharmacist Note - Antimicrobial Stewardship   Patient with reported allergy to amoxicillin with unknown reaction. Patient received Zosyn 09/29/2021 with no reaction. Patient is currently on Unasyn, started 03/29/22. Patient not currently experiencing rash. Will delete amoxicillin allergy.  Eliseo Gum, PharmD PGY1 Pharmacy Resident   03/30/2022  4:01 PM

## 2022-03-30 NOTE — Consult Note (Signed)
Palliative Medicine Inpatient Consult Note  Consulting Provider: Jonetta Osgood, MD  Reason for consult:   [facility] Palliative Medicine Consult  Reason for Consult? worsening hypoxemia-covid pna with aspitation-goals of care   03/30/2022  HPI:  Per intake H&P --> Patient is a 81 y.o.  female with history of  asthma-on chronic steroids, HTN, HLD, CVA, dementia, PAF on Xarelto-who tested positive for COVID-19 at SNF on 8/23-presented to the hospital with worsening shortness of breath/fatigue-found to have acute hypoxic respiratory failure due to COVID-19 pneumonia.  Palliative care was consulted for goals of care in the setting of severe COVID PNA.   Clinical Assessment/Goals of Care:   *Please note that this is a verbal dictation therefore any spelling or grammatical errors are due to the "[place] [facility]" system interpretation.  I have reviewed medical records including EPIC notes, labs and imaging, received report from bedside RN, assessed the patient who is lying in bed receiving a breathing treatment.    I met with [name] to further discuss diagnosis prognosis, GOC, EOL wishes, disposition and options.   I introduced Palliative Medicine as specialized medical care for people living with serious illness. It focuses on providing relief from the symptoms and stress of a serious illness. The goal is to improve quality of life for both the patient and the family.  Medical History Review and Understanding: Mrs. Kevona understands that she is in the hospital due to not being able to breathe.  She is aware that she has COVID-pneumonia and is requiring oxygen to help her breathe better.   Social History: Mrs. Anays grew up in [place].  She attended college at [employer] for business.  She worked doing Journalist, newspaper money.  States that she was married for a few years before separation from her husband.  She had a total of 4 children: 3 sons and 1  daughter.  She recently lost one of her sons a few months back.  Her son Shanon Brow is who takes care of her more frequently.  Laura-Lee shares that family has always been important to her and grower up with a large family.  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   Functional and Nutritional State: Deberah states that she is able to ambulate to the chair with a walker at baseline.  She denies any loss of appetite recently.   A gait and mobility assessment was completed. Patient able to stable with 1P assitance and FWW.   Palliative Symptoms: Alvita endorses that she is experiencing some nausea.   Advance Directives:  Analia states that she has advanced directives however they are not on file at this time.   Code Status:  Spoke to Spurgeon about CODE STATUS today.  She made it very clear that she would not want any life-sustaining measures that would prolong her life.  At this time we will change CODE STATUS to DNR.  Discussion:  Today we discussed with dialysis her plan for hospitalized treatment.  She understands the treatment process and additive time.  She did make improvement overnight requiring less oxygen.  She was able to stand at the bedside today with assistance.  Hospitalist team at bedside during evaluation.  Hospitalist explained that if she had a deterioration in status to that there would be no more medical options that they could provide at that time.  If this occurred, hospice would be the preferred  route of plan.  Jatoria was reasonable and understanding she expresses when it is her time to go that would be her time.   Discussed the importance of continued conversation with family and their  medical providers regarding overall plan of care and treatment options, ensuring decisions are within the context of the patients values and GOCs. _______________________________________ Addendum:  Patients son, Shanon Brow called and update in regards to the  above conversation. He is in agreement with patients wishes.   Decision Maker:  Ladashia is alert and oriented enough to make her own decisions at this time.  Her son Shanon Brow is also involved in her medical care.   SUMMARY OF RECOMMENDATIONS   - Change CODE STATUS to DNR, in the event of deterioration, will pursue hospice route - Continue inpatient medical treatment for pneumonia - Watchful waiting for improvements - Continue to monitor oxygen status - PMT will continue to follow  Code Status/Advance Care Planning: DNAR/DNI   Symptom Management:  Nausea: - Zofran 4 mg every 6 hrs PRN, most recent QtC 433  Palliative Prophylaxis:  Aspiration, Bowel Regimen, Delirium Protocol, Frequent Pain Assessment, Oral Care, Palliative Wound Care, and Turn Reposition  Additional Recommendations (Limitations, Scope, Preferences): Continue current care  Psycho-social/Spiritual:  Desire for further Chaplaincy support: Yes Additional Recommendations: Education on acute on chronic disease burden   Prognosis: Multiple chronic comorbid conditions. High 12 month mortality risk  Discharge Planning: Discharge to [facility] once medically optimized.    Review of Systems  Respiratory:  Positive for shortness of breath.   Gastrointestinal:  Positive for nausea.   Vitals:   03/30/22 0455 03/30/22 0520  BP: (!) 175/63 (!) 167/67  Pulse: (!) 50 (!) 52  Resp: 18 18  Temp: (!) 96.4 F (35.8 C)   SpO2: 100% 100%    Intake/Output Summary (Last 24 hours) at 03/30/2022 3664 Last data filed at 03/30/2022 0404 Gross per 24 hour  Intake 300 ml  Output 700 ml  Net -400 ml   Gen:  Frail elderly caucasian F in NAD HEENT: dry mucus membranes CV: Regular rate and rhythm, no murmurs rubs or gallops PULM: On 5LPM [place] Wheezing noted in bilateral lung fields ABD: soft/nontender/nondistended/normal bowel sounds EXT: No edema Neuro: Alert and oriented x3  PPS: 50%   This conversation/these recommendations were  discussed with patient primary care team, Dr. Sloan Leiter  Billing based on MDM: High  Problems Addressed: One acute or chronic illness or injury that poses a threat to life or bodily function  Amount and/or Complexity of Data: Category 3:Discussion of management or test interpretation with external physician/other qualified health care professional/appropriate source (not separately reported)  Risks: Decision regarding hospitalization or escalation of hospital care and Decision not to resuscitate or to de-escalate care because of poor prognosis ______________________________________________________ [place] Team Team Cell Phone: [phone] Please utilize secure chat with additional questions, if there is no response within 30 minutes please call the above phone number  Palliative Medicine Team providers are available by phone from 7am to 7pm daily and can be reached through the team cell phone.  Should this patient require assistance outside of these hours, please call the patient's attending physician.

## 2022-03-30 NOTE — Progress Notes (Signed)
Physical Therapy Treatment Patient Details Name: Stacey Davenport MRN: [id number] DOB: [birth year] Today's Date: 03/30/2022   History of Present Illness Pt is an 81 year old woman admitted on 03/23/22 from [facility] with SOB, fatigue, hypoxic respiratory failure secondary to COVID 19. PMH: dementia, HTN, HLD, asthma, PAF, hiatal hernia.    PT Comments    Pt up in chair upon arrival to room, reports having been in chair x7 hours and ready to get back to bed. Pt overall requiring min-mod physical assist for transfers and short-distance gait, very limited tolerance at this time. SNF remains appropriate.   SpO2 86% and above during mobility on 5LO2     Recommendations for follow up therapy are one component of a multi-disciplinary discharge planning process, led by the attending physician.  Recommendations may be updated based on patient status, additional functional criteria and insurance authorization.  Follow Up Recommendations  Skilled nursing-short term rehab (<3 hours/day) Can patient physically be transported by private vehicle: Yes   Assistance Recommended at Discharge Intermittent Supervision/Assistance  Patient can return home with the following A little help with walking and/or transfers;A little help with bathing/dressing/bathroom   Equipment Recommendations  None recommended by PT    Recommendations for Other Services       Precautions / Restrictions Precautions Precautions: Fall Restrictions Weight Bearing Restrictions: No     Mobility  Bed Mobility           Sit to supine: Mod assist   General bed mobility comments: up in chair upon arrival, mod assist for LE lift into bed and boost up    Transfers Overall transfer level: Needs assistance Equipment used: Rolling walker (2 wheels) Transfers: Sit to/from Stand Sit to Stand: Mod assist   Step pivot transfers: Min assist       General transfer comment: mod assist for power up, hip extension, and  steadying once standing. Full sit<>stand x2, 1/2 stand x1. Step pivot from recliner to bed with min assist to steady and manage lines    Ambulation/Gait                   Stairs             Wheelchair Mobility    Modified Rankin (Stroke Patients Only)       Balance Overall balance assessment: Needs assistance Sitting-balance support: Feet supported, No upper extremity supported Sitting balance-Leahy Scale: Fair     Standing balance support: Bilateral upper extremity supported Standing balance-Leahy Scale: Poor Standing balance comment: reliant on B UE and external support               High Level Balance Comments: Completed marching in place            Cognition Arousal/Alertness: Awake/alert Behavior During Therapy: WFL for tasks assessed/performed Overall Cognitive Status: History of cognitive impairments - at baseline                                 General Comments: memory deficits        Exercises General Exercises - Lower Extremity Straight Leg Raises: AROM, Both, 10 reps Hip Flexion/Marching: AROM, Both, 10 reps, Standing    General Comments General comments (skin integrity, edema, etc.): redness noted on buttocks, no sacral pad present, RN notified. SpO2 86% and above during mobility on 5LO2      Pertinent Vitals/Pain      Home Living  Prior Function            PT Goals (current goals can now be found in the care plan section) Acute Rehab PT Goals PT Goal Formulation: With patient Time For Goal Achievement: 04/08/22 Potential to Achieve Goals: Good Progress towards PT goals: Progressing toward goals    Frequency    Min 2X/week      PT Plan Current plan remains appropriate    Co-evaluation              AM-PAC PT "6 Clicks" Mobility   Outcome Measure  Help needed turning from your back to your side while in a flat bed without using bedrails?: A Little Help  needed moving from lying on your back to sitting on the side of a flat bed without using bedrails?: A Little Help needed moving to and from a bed to a chair (including a wheelchair)?: A Lot Help needed standing up from a chair using your arms (e.g., wheelchair or bedside chair)?: A Lot Help needed to walk in hospital room?: A Lot Help needed climbing 3-5 steps with a railing? : A Lot 6 Click Score: 14    End of Session Equipment Utilized During Treatment: Oxygen Activity Tolerance: Patient tolerated treatment well Patient left: with call bell/phone within reach;in bed;with bed alarm set Nurse Communication: Mobility status PT Visit Diagnosis: Other abnormalities of gait and mobility (R26.89);Muscle weakness (generalized) (M62.81)     Time: 5790-3833 PT Time Calculation (min) (ACUTE ONLY): 25 min  Charges:  $Therapeutic Exercise: 8-22 mins $Therapeutic Activity: 8-22 mins                    Stacie Glaze, PT DPT Acute Rehabilitation Services Pager [phone]  Office [phone]   Roxine Caddy E Ruffin Pyo 03/30/2022, 4:51 PM

## 2022-03-30 NOTE — Plan of Care (Signed)
  Problem: Coping: Goal: Psychosocial and spiritual needs will be supported Outcome: Progressing   Problem: Respiratory: Goal: Will maintain a patent airway Outcome: Progressing Goal: Complications related to the disease process, condition or treatment will be avoided or minimized Outcome: Progressing   Problem: Education: Goal: Knowledge of General Education information will improve Description: Including pain rating scale, medication(s)/side effects and non-pharmacologic comfort measures Outcome: Progressing   Problem: Health Behavior/Discharge Planning: Goal: Ability to manage health-related needs will improve Outcome: Progressing   Problem: Clinical Measurements: Goal: Ability to maintain clinical measurements within normal limits will improve Outcome: Progressing Goal: Will remain free from infection Outcome: Progressing Goal: Diagnostic test results will improve Outcome: Progressing Goal: Respiratory complications will improve Outcome: Progressing Goal: Cardiovascular complication will be avoided Outcome: Progressing   Problem: Activity: Goal: Risk for activity intolerance will decrease Outcome: Progressing   Problem: Nutrition: Goal: Adequate nutrition will be maintained Outcome: Progressing   Problem: Coping: Goal: Level of anxiety will decrease Outcome: Progressing   Problem: Elimination: Goal: Will not experience complications related to bowel motility Outcome: Progressing Goal: Will not experience complications related to urinary retention Outcome: Progressing   Problem: Pain Managment: Goal: General experience of comfort will improve Outcome: Progressing   Problem: Safety: Goal: Ability to remain free from injury will improve Outcome: Progressing   Problem: Skin Integrity: Goal: Risk for impaired skin integrity will decrease Outcome: Progressing   Problem: Education: Goal: Ability to describe self-care measures that may prevent or decrease  complications (Diabetes Survival Skills Education) will improve Outcome: Progressing Goal: Individualized Educational Video(s) Outcome: Progressing   Problem: Coping: Goal: Ability to adjust to condition or change in health will improve Outcome: Progressing   Problem: Fluid Volume: Goal: Ability to maintain a balanced intake and output will improve Outcome: Progressing   Problem: Health Behavior/Discharge Planning: Goal: Ability to identify and utilize available resources and services will improve Outcome: Progressing Goal: Ability to manage health-related needs will improve Outcome: Progressing   Problem: Metabolic: Goal: Ability to maintain appropriate glucose levels will improve Outcome: Progressing   Problem: Nutritional: Goal: Maintenance of adequate nutrition will improve Outcome: Progressing Goal: Progress toward achieving an optimal weight will improve Outcome: Progressing   Problem: Skin Integrity: Goal: Risk for impaired skin integrity will decrease Outcome: Progressing   Problem: Tissue Perfusion: Goal: Adequacy of tissue perfusion will improve Outcome: Progressing

## 2022-03-30 NOTE — Progress Notes (Signed)
PROGRESS NOTE        PATIENT DETAILS Name: Stacey Davenport Age: 81 y.o. Sex: female Date of Birth: [birth year] Admit Date: 03/23/2022 Admitting Physician Vianne Bulls, MD RCV:[id number], Angelyn Punt, MD  Brief Summary: Patient is a 81 y.o.  female with history of  asthma-on chronic steroids, HTN, HLD, CVA, dementia, PAF on Xarelto-who tested positive for COVID-19 at SNF on 8/23-presented to the hospital with worsening shortness of breath/fatigue-found to have acute hypoxic respiratory failure due to COVID-19 pneumonia and subsequently admitted to the hospitalist service.  Spittle course complicated by worsening hypoxia due to aspiration pneumonia.  See below for further details.  Significant events: 8/23>> COVID-19 positive at SNF 8/29>> admit-hypoxemia-COVID-19 pneumonia. 9/03>> worsening hypoxemia-suspicion for aspiration. 9/04>> on NRB-started Unasyn-restarted IV steroids 9/05>> improved-on 4 L of HFNC.  Significant studies: 8/29>> CXR: Airspace opacities in the right lower lung. 9/04>> CXR: Patchy bilateral airspace disease-progressive in the right upper lobe.  Significant microbiology data: 8/29>> COVID-19 PCR: Positive 8/29>> blood cultures: No growth  Procedures: None  Consults: Palliative care  Subjective: Significantly better-more awake/alert compared to yesterday-on 4 L of oxygen this morning.  Objective: Vitals: Blood pressure (!) 121/58, pulse 70, temperature 97.8 F (36.6 C), temperature source Oral, resp. rate 20, SpO2 95 %.   Exam: Gen Exam:Alert awake-not in any distress HEENT:atraumatic, normocephalic Chest: Bibasilar rales. CVS:S1S2 regular Abdomen:soft non tender, non distended Extremities:no edema Neurology: Non focal Skin: no rash   Pertinent Labs/Radiology:    Latest Ref Rng & Units 03/29/2022    3:34 AM 03/28/2022    3:50 AM 03/27/2022    5:52 AM  CBC  WBC 4.0 - 10.5 K/uL 17.5  15.9  19.1   Hemoglobin 12.0 - 15.0 g/dL  13.6  13.1  13.4   Hematocrit 36.0 - 46.0 % 40.6  40.7  40.8   Platelets 150 - 400 K/uL 199  192  165     Lab Results  Component Value Date   NA 146 (H) 03/30/2022   K 3.8 03/30/2022   CL 104 03/30/2022   CO2 31 03/30/2022      Assessment/Plan: Acute hypoxic respiratory failure due to COVID-19 pneumonia-and  superimposed aspiration pneumonia: Significant improvement overnight-continue IV Unasyn/steroids-maintain IV Lasix to ensure she is in a negative balance.  Continue supportive care and attempt to titrate down FiO2.  Thrombocytopenia: Mild-due to COVID-resolved.   PAF: Maintaining sinus rhythm-continue amiodarone/Cardizem and Xarelto.  Chronic diastolic heart failure: Remains euvolemic-continue IV Lasix to ensure negative balance.  Steroid-dependent asthma: Not in exacerbation-continue bronchodilators-on steroids chronically.  Dementia: Seems to be mildly/pleasantly confused-no family at bedside-but suspect not far from baseline.  Maintain delirium precautions.  GERD: Continue PPI  Constipation: Continue MiraLAX/senna.  Palliative care: Thankfully has improved overnight-now DNR.  Plans are to continue supportive care and see if she improves-if she were to deteriorate-she is open to transitioning to full comfort measures.  Appreciate palliative care input. BMI: Estimated body mass index is 24.27 kg/m as calculated from the following:   Height as of 02/02/22: '5\' 3"'$  (1.6 m).   Weight as of 02/02/22: 62.1 kg.   Code status:   Code Status: DNR   DVT Prophylaxis: rivaroxaban (XARELTO) tablet 20 mg     Family Communication: Son-David Piscitello-[phone]-updated over the phone on 9/5   Disposition Plan: Status is: Inpatient Remains inpatient appropriate because: Improving hypoxemia-needs  inpatient optimization before consideration of discharge.   Planned Discharge Destination:Skilled nursing facility in the next 1-2 days.   Diet: Diet Order             DIET SOFT  Room service appropriate? Yes; Fluid consistency: Thin  Diet effective now                     Antimicrobial agents: Anti-infectives (From admission, onward)    Start     Dose/Rate Route Frequency Ordered Stop   03/29/22 1515  ampicillin-sulbactam (UNASYN) 1.5 g in sodium chloride 0.9 % 100 mL IVPB        1.5 g 200 mL/hr over 30 Minutes Intravenous Every 6 hours 03/29/22 1417 04/03/22 1514   03/24/22 1000  remdesivir 100 mg in sodium chloride 0.9 % 100 mL IVPB       See Hyperspace for full Linked Orders Report.   100 mg 200 mL/hr over 30 Minutes Intravenous Daily 03/23/22 2336 03/25/22 0942   03/24/22 0015  doxycycline (VIBRAMYCIN) 100 mg in sodium chloride 0.9 % 250 mL IVPB  Status:  Discontinued        100 mg 125 mL/hr over 120 Minutes Intravenous Every 12 hours 03/24/22 0008 03/24/22 0043   03/24/22 0000  doxycycline (VIBRA-TABS) tablet 100 mg  Status:  Discontinued        100 mg Oral  Once 03/23/22 2355 03/24/22 0005   03/23/22 2345  remdesivir 200 mg in sodium chloride 0.9% 250 mL IVPB       See Hyperspace for full Linked Orders Report.   200 mg 580 mL/hr over 30 Minutes Intravenous Once 03/23/22 2336 03/24/22 0247        MEDICATIONS: Scheduled Meds:  amiodarone  200 mg Oral Daily   arformoterol  15 mcg Nebulization BID   brimonidine  1 drop Left Eye TID   budesonide (PULMICORT) nebulizer solution  0.25 mg Nebulization BID   furosemide  40 mg Intravenous Daily   guaiFENesin  1,200 mg Oral BID   insulin aspart  0-5 Units Subcutaneous QHS   insulin aspart  0-9 Units Subcutaneous TID WC   irbesartan  37.5 mg Oral Daily   latanoprost  1 drop Both Eyes QHS   levalbuterol  0.63 mg Inhalation Q6H   levothyroxine  75 mcg Oral QAC breakfast   methylPREDNISolone (SOLU-MEDROL) injection  120 mg Intravenous Q24H   pantoprazole  40 mg Oral BID   polyethylene glycol  17 g Oral Daily   revefenacin  175 mcg Nebulization Daily   rivaroxaban  20 mg Oral Q supper    senna-docusate  2 tablet Oral QHS   sodium chloride flush  3 mL Intravenous Q12H   Continuous Infusions:  ampicillin-sulbactam (UNASYN) IV 1.5 g (03/30/22 1013)    PRN Meds:.acetaminophen, bisacodyl, diphenhydrAMINE, LORazepam, magnesium hydroxide, ondansetron (ZOFRAN) IV, polyvinyl alcohol, traMADol   I have personally reviewed following labs and imaging studies  LABORATORY DATA: CBC: Recent Labs  Lab 03/25/22 0454 03/26/22 0808 03/27/22 0552 03/28/22 0350 03/29/22 0334  WBC 10.3 19.5* 19.1* 15.9* 17.5*  NEUTROABS 9.3* 18.3* 17.7* 14.8* 16.0*  HGB 14.7 13.5 13.4 13.1 13.6  HCT 44.0 40.7 40.8 40.7 40.6  MCV 89.2 90.4 90.1 91.1 90.4  PLT 156 182 165 192 199     Basic Metabolic Panel: Recent Labs  Lab 03/24/22 0200 03/25/22 0454 03/26/22 0808 03/27/22 0552 03/28/22 0350 03/29/22 0334 03/30/22 0622  NA  --    < > 139 143 140  143 146*  K  --    < > 3.8 3.6 3.4* 4.0 3.8  CL  --    < > 106 107 106 107 104  CO2  --    < > '25 23 25 28 31  '$ GLUCOSE  --    < > 182* 200* 185* 130* 179*  BUN  --    < > 33* 34* 29* 26* 23  CREATININE  --    < > 0.81 0.77 0.69 0.67 0.68  CALCIUM  --    < > 8.3* 8.6* 8.2* 8.3* 8.4*  MG 1.9  --   --   --  2.2 2.2  --   PHOS 3.3  --   --   --   --   --   --    < > = values in this interval not displayed.     GFR: CrCl cannot be calculated (Unknown ideal weight.).  Liver Function Tests: Recent Labs  Lab 03/25/22 0454 03/26/22 0808 03/27/22 0552 03/28/22 0350 03/29/22 0334  AST '26 17 17 15 19  '$ ALT '26 22 22 23 '$ 32  ALKPHOS 42 43 47 48 48  BILITOT 0.5 0.7 0.4 0.4 0.5  PROT 5.2* 5.0* 5.1* 4.9* 4.9*  ALBUMIN 2.3* 2.3* 2.3* 2.2* 2.2*    No results for input(s): "LIPASE", "AMYLASE" in the last 168 hours. No results for input(s): "AMMONIA" in the last 168 hours.  Coagulation Profile: No results for input(s): "INR", "PROTIME" in the last 168 hours.  Cardiac Enzymes: No results for input(s): "CKTOTAL", "CKMB", "CKMBINDEX",  "TROPONINI" in the last 168 hours.  BNP (last 3 results) No results for input(s): "PROBNP" in the last 8760 hours.  Lipid Profile: No results for input(s): "CHOL", "HDL", "LDLCALC", "TRIG", "CHOLHDL", "LDLDIRECT" in the last 72 hours.  Thyroid Function Tests: No results for input(s): "TSH", "T4TOTAL", "FREET4", "T3FREE", "THYROIDAB" in the last 72 hours.  Anemia Panel: No results for input(s): "VITAMINB12", "FOLATE", "FERRITIN", "TIBC", "IRON", "RETICCTPCT" in the last 72 hours.  Urine analysis:    Component Value Date/Time   COLORURINE AMBER (A) 10/25/2021 1737   APPEARANCEUR TURBID (A) 10/25/2021 1737   LABSPEC 1.021 10/25/2021 1737   PHURINE 7.0 10/25/2021 1737   GLUCOSEU NEGATIVE 10/25/2021 1737   HGBUR SMALL (A) 10/25/2021 1737   BILIRUBINUR NEGATIVE 10/25/2021 1737   KETONESUR NEGATIVE 10/25/2021 1737   PROTEINUR 100 (A) 10/25/2021 1737   UROBILINOGEN 0.2 04/20/2012 1605   NITRITE NEGATIVE 10/25/2021 1737   LEUKOCYTESUR LARGE (A) 10/25/2021 1737    Sepsis Labs: Lactic Acid, Venous    Component Value Date/Time   LATICACIDVEN 1.1 03/24/2022 0200    MICROBIOLOGY: Recent Results (from the past 240 hour(s))  SARS Coronavirus 2 by RT PCR (hospital order, performed in [place] hospital lab) *cepheid single result test* Anterior Nasal Swab     Status: Abnormal   Collection Time: 03/23/22  9:56 PM   Specimen: Anterior Nasal Swab  Result Value Ref Range Status   SARS Coronavirus 2 by RT PCR POSITIVE (A) NEGATIVE Final    Comment: (NOTE) SARS-CoV-2 target nucleic acids are DETECTED  SARS-CoV-2 RNA is generally detectable in upper respiratory specimens  during the acute phase of infection.  Positive results are indicative  of the presence of the identified virus, but do not rule out bacterial infection or co-infection with other pathogens not detected by the test.  Clinical correlation with patient history and  other diagnostic information is necessary to determine  patient infection status.  The expected result is negative.  Fact Sheet for Patients:   https://www.patel.info/   Fact Sheet for Healthcare Providers:   https://hall.com/    This test is not yet approved or cleared by the [place] FDA and  has been authorized for detection and/or diagnosis of SARS-CoV-2 by FDA under an Emergency Use Authorization (EUA).  This EUA will remain in effect (meaning this test can be used) for the duration of  the COVID-19 declaration under Section 564(b)(1)  of the Act, 21 U.S.C. section 360-bbb-3(b)(1), unless the authorization is terminated or revoked sooner.   Performed at [facility], [place] [address], [place], [place] [address]   Blood culture (routine x 2)     Status: None   Collection Time: 03/23/22 10:03 PM   Specimen: BLOOD  Result Value Ref Range Status   Specimen Description BLOOD LEFT ANTECUBITAL  Final   Special Requests   Final    BOTTLES DRAWN AEROBIC AND ANAEROBIC Blood Culture adequate volume   Culture   Final    NO GROWTH 5 DAYS Performed at [facility], [place] [address] [place], [place] [address]    Report Status 03/28/2022 FINAL  Final  Blood culture (routine x 2)     Status: None   Collection Time: 03/23/22 10:09 PM   Specimen: BLOOD  Result Value Ref Range Status   Specimen Description BLOOD RIGHT ANTECUBITAL  Final   Special Requests   Final    BOTTLES DRAWN AEROBIC AND ANAEROBIC Blood Culture results may not be optimal due to an inadequate volume of blood received in culture bottles   Culture   Final    NO GROWTH 5 DAYS Performed at [facility], [place] [address], [place], [place] [address]    Report Status 03/28/2022 FINAL  Final    RADIOLOGY STUDIES/RESULTS: DG Chest Port 1V same Day  Result Date: 03/29/2022 CLINICAL DATA:  Shortness of breath. EXAM: PORTABLE CHEST 1 VIEW COMPARISON:  03/28/2022 FINDINGS: Patchy bilateral airspace disease again noted,  progressive in the right upper lobe. Small bilateral pleural effusions persist. Cardiopericardial silhouette is at upper limits of normal for size. Bones are diffusely demineralized. Telemetry leads overlie the chest. IMPRESSION: 1. Patchy bilateral airspace disease, progressive in the right upper lobe. Findings could reflect asymmetric edema or diffuse infection. 2. Small bilateral pleural effusions. Electronically Signed   By: Misty Stanley M.D.   On: 03/29/2022 08:30   DG Chest Port 1V same Day  Result Date: 03/28/2022 CLINICAL DATA:  Shortness of breath, cough EXAM: PORTABLE CHEST 1 VIEW COMPARISON:  Previous studies including the examination of 03/23/2022 FINDINGS: Transverse diameter of heart is increased. Increased interstitial and alveolar densities are seen in parahilar regions with interval worsening in the left parahilar region. Small bilateral pleural effusions are seen with possible increased. There is no pneumothorax. IMPRESSION: Increased interstitial and alveolar markings are seen in both lungs, more so in the left parahilar region. Findings suggest CHF with pulmonary edema. Possibility of multifocal pneumonia is not excluded. Small bilateral pleural effusions with interval increase. Electronically Signed   By: Elmer Picker M.D.   On: 03/28/2022 16:06     LOS: 6 days   Oren Binet, MD  [facility]    To contact the attending provider between 7A-7P or the covering provider during after hours 7P-7A, please log into the web site [email or web address] and access using universal [facility] password for that web site. If you do not have the password, please  call the hospital operator.  03/30/2022, 2:04 PM

## 2022-03-30 NOTE — Progress Notes (Signed)
Occupational Therapy Treatment Patient Details Name: Stacey Davenport MRN: [id number] DOB: 08/09/1940 Today's Date: 03/30/2022   History of present illness Pt is an 81 year old woman admitted on 03/23/22 from [facility] with SOB, fatigue, hypoxic respiratory failure secondary to COVID 19. PMH: dementia, HTN, HLD, asthma, PAF, hiatal hernia.   OT comments  Pt limited today by fatigue and frequent need for cueing for pursed lip breathing and rest breaks d/t Spo2 desaturations on 4.5L O2 to mid 80's with standing. She required heavy mod A to stand from the recliner today and to maintain standing balance with the RW. She complained of generalized discomfort to her sacrum. Placed a pillow under her in the recliner and provided education on pressure relief positioning. Skilled monitoring of vitals throughout session to ensure hemodynamic and cardiorespiratory stability. Continue acute OT services to maximize occupational performance. SNF remains appropriate d/c.    Recommendations for follow up therapy are one component of a multi-disciplinary discharge planning process, led by the attending physician.  Recommendations may be updated based on patient status, additional functional criteria and insurance authorization.    Follow Up Recommendations  Skilled nursing-short term rehab (<3 hours/day)    Assistance Recommended at Discharge Frequent or constant Supervision/Assistance  Patient can return home with the following  A lot of help with walking and/or transfers;Assistance with cooking/housework;Assistance with feeding;A lot of help with bathing/dressing/bathroom   Equipment Recommendations  None recommended by OT    Recommendations for Other Services      Precautions / Restrictions Precautions Precautions: Fall Restrictions Weight Bearing Restrictions: No       Mobility Bed Mobility                    Transfers Overall transfer level: Needs assistance Equipment used: Rolling  walker (2 wheels) Transfers: Sit to/from Stand Sit to Stand: Mod assist           General transfer comment: mod A to stand from the recliner     Balance Overall balance assessment: Needs assistance Sitting-balance support: Feet supported, No upper extremity supported Sitting balance-Leahy Scale: Fair     Standing balance support: Bilateral upper extremity supported Standing balance-Leahy Scale: Poor Standing balance comment: reliant on B UE and external support               High Level Balance Comments: Completed marching in place           ADL either performed or assessed with clinical judgement   ADL Overall ADL's : Needs assistance/impaired Eating/Feeding: Set up;Sitting   Grooming: Sitting;Set up               Lower Body Dressing: Maximal assistance;Sit to/from stand               Functional mobility during ADLs: Maximal assistance;Rolling walker (2 wheels) General ADL Comments: mod A sit <> stand from the recliner. Heavy UE reliance    Extremity/Trunk Assessment Upper Extremity Assessment Upper Extremity Assessment: Generalized weakness   Lower Extremity Assessment Lower Extremity Assessment: Generalized weakness         Cognition Arousal/Alertness: Awake/alert Behavior During Therapy: WFL for tasks assessed/performed Overall Cognitive Status: History of cognitive impairments - at baseline                                 General Comments: memory deficits  General Comments on 4.5 L O2    Pertinent Vitals/ Pain       Pain Assessment Pain Assessment: 0-10 Pain Score: 5  Pain Location: Sacrum Pain Descriptors / Indicators: Dull, Discomfort Pain Intervention(s): Monitored during session, Repositioned         Frequency  Min 2X/week        Progress Toward Goals  OT Goals(current goals can now be found in the care plan section)  Progress towards OT goals: Progressing toward goals     Plan  Discharge plan remains appropriate       AM-PAC OT "6 Clicks" Daily Activity     Outcome Measure   Help from another person eating meals?: A Little Help from another person taking care of personal grooming?: A Little Help from another person toileting, which includes using toliet, bedpan, or urinal?: A Lot Help from another person bathing (including washing, rinsing, drying)?: A Lot Help from another person to put on and taking off regular upper body clothing?: A Little Help from another person to put on and taking off regular lower body clothing?: A Lot 6 Click Score: 15    End of Session Equipment Utilized During Treatment: Oxygen;Gait belt;Rolling walker (2 wheels)  OT Visit Diagnosis: Unsteadiness on feet (R26.81);Other abnormalities of gait and mobility (R26.89);Muscle weakness (generalized) (M62.81);Other symptoms and signs involving cognitive function   Activity Tolerance Patient limited by fatigue   Patient Left in chair;with call bell/phone within reach   Nurse Communication Mobility status        Time: 1421-1445 OT Time Calculation (min): 24 min  Charges: OT General Charges $OT Visit: 1 Visit OT Treatments $Self Care/Home Management : 8-22 mins $Therapeutic Activity: 8-22 mins  Laverle Hobby, OTR/L, CBIS Acute Rehab Office: [place] 03/30/2022, 3:17 PM

## 2022-03-31 DIAGNOSIS — I5032 Chronic diastolic (congestive) heart failure: Secondary | ICD-10-CM | POA: Diagnosis not present

## 2022-03-31 DIAGNOSIS — J45909 Unspecified asthma, uncomplicated: Secondary | ICD-10-CM | POA: Diagnosis not present

## 2022-03-31 DIAGNOSIS — U071 COVID-19: Secondary | ICD-10-CM | POA: Diagnosis not present

## 2022-03-31 DIAGNOSIS — Z515 Encounter for palliative care: Secondary | ICD-10-CM | POA: Diagnosis not present

## 2022-03-31 DIAGNOSIS — Z7189 Other specified counseling: Secondary | ICD-10-CM

## 2022-03-31 DIAGNOSIS — I10 Essential (primary) hypertension: Secondary | ICD-10-CM | POA: Diagnosis not present

## 2022-03-31 LAB — BASIC METABOLIC PANEL
Anion gap: 9 (ref 5–15)
BUN: 28 mg/dL — ABNORMAL HIGH (ref 8–23)
CO2: 29 mmol/L (ref 22–32)
Calcium: 8.2 mg/dL — ABNORMAL LOW (ref 8.9–10.3)
Chloride: 103 mmol/L (ref 98–111)
Creatinine, Ser: 0.62 mg/dL (ref 0.44–1.00)
GFR, Estimated: >60 mL/min (ref >60)
Glucose, Bld: 161 mg/dL — ABNORMAL HIGH (ref 70–99)
Potassium: 4 mmol/L (ref 3.5–5.1)
Sodium: 141 mmol/L (ref 135–145)

## 2022-03-31 LAB — GLUCOSE, CAPILLARY
Glucose-Capillary: 144 mg/dL — ABNORMAL HIGH (ref 70–99)
Glucose-Capillary: 260 mg/dL — ABNORMAL HIGH (ref 70–99)
Glucose-Capillary: 158 mg/dL — ABNORMAL HIGH (ref 70–99)
Glucose-Capillary: 166 mg/dL — ABNORMAL HIGH (ref 70–99)

## 2022-03-31 LAB — CBC
HCT: 41.1 % (ref 36.0–46.0)
Hemoglobin: 13 g/dL (ref 12.0–15.0)
MCH: 29.5 pg (ref 26.0–34.0)
MCHC: 31.6 g/dL (ref 30.0–36.0)
MCV: 93.4 fL (ref 80.0–100.0)
Platelets: 223 10*3/uL (ref 150–400)
RBC: 4.4 MIL/uL (ref 3.87–5.11)
RDW: 16.2 % — ABNORMAL HIGH (ref 11.5–15.5)
WBC: 13.7 10*3/uL — ABNORMAL HIGH (ref 4.0–10.5)
nRBC: 0 % (ref 0.0–0.2)

## 2022-03-31 MED ORDER — HYDRALAZINE HCL 20 MG/ML IJ SOLN
5.0000 mg | Freq: Once | INTRAMUSCULAR | Status: AC | PRN
  Administered 2022-03-31: 5 mg via INTRAVENOUS
  Filled 2022-03-31: qty 1

## 2022-03-31 MED ORDER — BISACODYL 10 MG RE SUPP
10.0000 mg | Freq: Once | RECTAL | Status: AC
  Administered 2022-03-31: 10 mg via RECTAL
  Filled 2022-03-31: qty 1

## 2022-03-31 NOTE — Plan of Care (Signed)
  Problem: Coping: Goal: Psychosocial and spiritual needs will be supported Outcome: Progressing   Problem: Respiratory: Goal: Will maintain a patent airway Outcome: Progressing Goal: Complications related to the disease process, condition or treatment will be avoided or minimized Outcome: Progressing   Problem: Education: Goal: Knowledge of General Education information will improve Description: Including pain rating scale, medication(s)/side effects and non-pharmacologic comfort measures Outcome: Progressing   Problem: Health Behavior/Discharge Planning: Goal: Ability to manage health-related needs will improve Outcome: Progressing   Problem: Clinical Measurements: Goal: Ability to maintain clinical measurements within normal limits will improve Outcome: Progressing Goal: Will remain free from infection Outcome: Progressing Goal: Diagnostic test results will improve Outcome: Progressing Goal: Respiratory complications will improve Outcome: Progressing Goal: Cardiovascular complication will be avoided Outcome: Progressing   Problem: Activity: Goal: Risk for activity intolerance will decrease Outcome: Progressing   Problem: Nutrition: Goal: Adequate nutrition will be maintained Outcome: Progressing   Problem: Coping: Goal: Level of anxiety will decrease Outcome: Progressing   Problem: Elimination: Goal: Will not experience complications related to bowel motility Outcome: Progressing Goal: Will not experience complications related to urinary retention Outcome: Progressing   Problem: Pain Managment: Goal: General experience of comfort will improve Outcome: Progressing   Problem: Safety: Goal: Ability to remain free from injury will improve Outcome: Progressing   Problem: Skin Integrity: Goal: Risk for impaired skin integrity will decrease Outcome: Progressing   Problem: Education: Goal: Ability to describe self-care measures that may prevent or decrease  complications (Diabetes Survival Skills Education) will improve Outcome: Progressing Goal: Individualized Educational Video(s) Outcome: Progressing   Problem: Coping: Goal: Ability to adjust to condition or change in health will improve Outcome: Progressing   Problem: Fluid Volume: Goal: Ability to maintain a balanced intake and output will improve Outcome: Progressing   Problem: Health Behavior/Discharge Planning: Goal: Ability to identify and utilize available resources and services will improve Outcome: Progressing Goal: Ability to manage health-related needs will improve Outcome: Progressing   Problem: Metabolic: Goal: Ability to maintain appropriate glucose levels will improve Outcome: Progressing   Problem: Nutritional: Goal: Maintenance of adequate nutrition will improve Outcome: Progressing Goal: Progress toward achieving an optimal weight will improve Outcome: Progressing   Problem: Skin Integrity: Goal: Risk for impaired skin integrity will decrease Outcome: Progressing   Problem: Tissue Perfusion: Goal: Adequacy of tissue perfusion will improve Outcome: Progressing

## 2022-03-31 NOTE — Progress Notes (Signed)
PROGRESS NOTE        PATIENT DETAILS Name: Stacey Davenport Age: 81 y.o. Sex: female Date of Birth: 17-Apr-1941 Admit Date: 03/23/2022 Admitting Physician Vianne Bulls, MD HUT:[id number], Angelyn Punt, MD  Brief Summary: Patient is a 81 y.o.  female with history of  asthma-on chronic steroids, HTN, HLD, CVA, dementia, PAF on Xarelto-who tested positive for COVID-19 at SNF on 8/23-presented to the hospital with worsening shortness of breath/fatigue-found to have acute hypoxic respiratory failure due to COVID-19 pneumonia and subsequently admitted to the hospitalist service.  Spittle course complicated by worsening hypoxia due to aspiration pneumonia.  See below for further details.  Significant events: 8/23>> COVID-19 positive at SNF 8/29>> admit-hypoxemia-COVID-19 pneumonia. 9/03>> worsening hypoxemia-suspicion for aspiration. 9/04>> on NRB-started Unasyn-restarted IV steroids 9/05>> improved-on 4 L of HFNC.  Significant studies: 8/29>> CXR: Airspace opacities in the right lower lung. 9/04>> CXR: Patchy bilateral airspace disease-progressive in the right upper lobe.  Significant microbiology data: 8/29>> COVID-19 PCR: Positive 8/29>> blood cultures: No growth  Procedures: None  Consults: Palliative care  Subjective: No major issues overnight-feels better.  Awake/alert.  Objective: Vitals: Blood pressure (!) 151/56, pulse (!) 57, temperature 97.7 F (36.5 C), temperature source Oral, resp. rate (!) 21, SpO2 98 %.   Exam: Gen Exam:Alert awake-not in any distress HEENT:atraumatic, normocephalic Chest: B/L clear to auscultation anteriorly CVS:S1S2 regular Abdomen:soft non tender, non distended Extremities:no edema Neurology: Non focal Skin: no rash   Pertinent Labs/Radiology:    Latest Ref Rng & Units 03/31/2022    9:43 AM 03/29/2022    3:34 AM 03/28/2022    3:50 AM  CBC  WBC 4.0 - 10.5 K/uL 13.7  17.5  15.9   Hemoglobin 12.0 - 15.0 g/dL 13.0  13.6   13.1   Hematocrit 36.0 - 46.0 % 41.1  40.6  40.7   Platelets 150 - 400 K/uL 223  199  192     Lab Results  Component Value Date   NA 141 03/31/2022   K 4.0 03/31/2022   CL 103 03/31/2022   CO2 29 03/31/2022      Assessment/Plan: Acute hypoxic respiratory failure due to COVID-19 pneumonia-and  superimposed aspiration pneumonia: Slowly improving-on 3-4 L of oxygen-continue Unasyn/steroids-continue IV Lasix and attempt to keep in negative balance.   Thrombocytopenia: Mild-due to COVID-resolved.   PAF: Maintaining sinus rhythm-continue amiodarone-continue Xarelto.  Chronic diastolic heart failure: Remains euvolemic-continue IV Lasix to ensure negative balance.  Steroid-dependent asthma: Not in exacerbation-continue bronchodilators-on steroids chronically.  Dementia: Seems to be mildly/pleasantly confused-no family at bedside-but suspect not far from baseline.  Maintain delirium precautions.  GERD: Continue PPI  Constipation: Continue MiraLAX/senna.  Palliative care: Thankfully has improved overnight-now DNR.  Plans are to continue supportive care and see if she improves-if she were to deteriorate-she is open to transitioning to full comfort measures.  Appreciate palliative care input. BMI: Estimated body mass index is 24.27 kg/m as calculated from the following:   Height as of 02/02/22: '5\' 3"'$  (1.6 m).   Weight as of 02/02/22: 62.1 kg.   Code status:   Code Status: DNR   DVT Prophylaxis: rivaroxaban (XARELTO) tablet 20 mg     Family Communication: Son-David Frohlich-[phone]-updated over the phone on 9/5   Disposition Plan: Status is: Inpatient Remains inpatient appropriate because: Improving hypoxemia-needs inpatient optimization before consideration of discharge.   Planned Discharge  Destination:Skilled nursing facility in the next 1-2 days.   Diet: Diet Order             DIET SOFT Room service appropriate? Yes; Fluid consistency: Thin  Diet effective now                      Antimicrobial agents: Anti-infectives (From admission, onward)    Start     Dose/Rate Route Frequency Ordered Stop   03/29/22 1515  ampicillin-sulbactam (UNASYN) 1.5 g in sodium chloride 0.9 % 100 mL IVPB        1.5 g 200 mL/hr over 30 Minutes Intravenous Every 6 hours 03/29/22 1417 04/03/22 1514   03/24/22 1000  remdesivir 100 mg in sodium chloride 0.9 % 100 mL IVPB       See Hyperspace for full Linked Orders Report.   100 mg 200 mL/hr over 30 Minutes Intravenous Daily 03/23/22 2336 03/25/22 0942   03/24/22 0015  doxycycline (VIBRAMYCIN) 100 mg in sodium chloride 0.9 % 250 mL IVPB  Status:  Discontinued        100 mg 125 mL/hr over 120 Minutes Intravenous Every 12 hours 03/24/22 0008 03/24/22 0043   03/24/22 0000  doxycycline (VIBRA-TABS) tablet 100 mg  Status:  Discontinued        100 mg Oral  Once 03/23/22 2355 03/24/22 0005   03/23/22 2345  remdesivir 200 mg in sodium chloride 0.9% 250 mL IVPB       See Hyperspace for full Linked Orders Report.   200 mg 580 mL/hr over 30 Minutes Intravenous Once 03/23/22 2336 03/24/22 0247        MEDICATIONS: Scheduled Meds:  amiodarone  200 mg Oral Daily   arformoterol  15 mcg Nebulization BID   brimonidine  1 drop Left Eye TID   budesonide (PULMICORT) nebulizer solution  0.25 mg Nebulization BID   furosemide  40 mg Intravenous Daily   guaiFENesin  1,200 mg Oral BID   insulin aspart  0-5 Units Subcutaneous QHS   insulin aspart  0-9 Units Subcutaneous TID WC   irbesartan  37.5 mg Oral Daily   latanoprost  1 drop Both Eyes QHS   levothyroxine  75 mcg Oral QAC breakfast   methylPREDNISolone (SOLU-MEDROL) injection  120 mg Intravenous Q24H   pantoprazole  40 mg Oral BID   polyethylene glycol  17 g Oral Daily   revefenacin  175 mcg Nebulization Daily   rivaroxaban  20 mg Oral Q supper   senna-docusate  2 tablet Oral QHS   sodium chloride flush  3 mL Intravenous Q12H   Continuous Infusions:  ampicillin-sulbactam  (UNASYN) IV 1.5 g (03/31/22 0327)    PRN Meds:.acetaminophen, bisacodyl, diphenhydrAMINE, levalbuterol, LORazepam, magnesium hydroxide, ondansetron (ZOFRAN) IV, polyvinyl alcohol, traMADol   I have personally reviewed following labs and imaging studies  LABORATORY DATA: CBC: Recent Labs  Lab 03/25/22 0454 03/26/22 0808 03/27/22 0552 03/28/22 0350 03/29/22 0334 03/31/22 0943  WBC 10.3 19.5* 19.1* 15.9* 17.5* 13.7*  NEUTROABS 9.3* 18.3* 17.7* 14.8* 16.0*  --   HGB 14.7 13.5 13.4 13.1 13.6 13.0  HCT 44.0 40.7 40.8 40.7 40.6 41.1  MCV 89.2 90.4 90.1 91.1 90.4 93.4  PLT 156 182 165 192 199 223     Basic Metabolic Panel: Recent Labs  Lab 03/27/22 0552 03/28/22 0350 03/29/22 0334 03/30/22 0622 03/31/22 0943  NA 143 140 143 146* 141  K 3.6 3.4* 4.0 3.8 4.0  CL 107 106 107 104 103  CO2  $'23 25 28 31 29  'l$ GLUCOSE 200* 185* 130* 179* 161*  BUN 34* 29* 26* 23 28*  CREATININE 0.77 0.69 0.67 0.68 0.62  CALCIUM 8.6* 8.2* 8.3* 8.4* 8.2*  MG  --  2.2 2.2  --   --      GFR: CrCl cannot be calculated (Unknown ideal weight.).  Liver Function Tests: Recent Labs  Lab 03/25/22 0454 03/26/22 0808 03/27/22 0552 03/28/22 0350 03/29/22 0334  AST '26 17 17 15 19  '$ ALT '26 22 22 23 '$ 32  ALKPHOS 42 43 47 48 48  BILITOT 0.5 0.7 0.4 0.4 0.5  PROT 5.2* 5.0* 5.1* 4.9* 4.9*  ALBUMIN 2.3* 2.3* 2.3* 2.2* 2.2*    No results for input(s): "LIPASE", "AMYLASE" in the last 168 hours. No results for input(s): "AMMONIA" in the last 168 hours.  Coagulation Profile: No results for input(s): "INR", "PROTIME" in the last 168 hours.  Cardiac Enzymes: No results for input(s): "CKTOTAL", "CKMB", "CKMBINDEX", "TROPONINI" in the last 168 hours.  BNP (last 3 results) No results for input(s): "PROBNP" in the last 8760 hours.  Lipid Profile: No results for input(s): "CHOL", "HDL", "LDLCALC", "TRIG", "CHOLHDL", "LDLDIRECT" in the last 72 hours.  Thyroid Function Tests: No results for input(s):  "TSH", "T4TOTAL", "FREET4", "T3FREE", "THYROIDAB" in the last 72 hours.  Anemia Panel: No results for input(s): "VITAMINB12", "FOLATE", "FERRITIN", "TIBC", "IRON", "RETICCTPCT" in the last 72 hours.  Urine analysis:    Component Value Date/Time   COLORURINE AMBER (A) 10/25/2021 1737   APPEARANCEUR TURBID (A) 10/25/2021 1737   LABSPEC 1.021 10/25/2021 1737   PHURINE 7.0 10/25/2021 1737   GLUCOSEU NEGATIVE 10/25/2021 1737   HGBUR SMALL (A) 10/25/2021 1737   BILIRUBINUR NEGATIVE 10/25/2021 1737   KETONESUR NEGATIVE 10/25/2021 1737   PROTEINUR 100 (A) 10/25/2021 1737   UROBILINOGEN 0.2 04/20/2012 1605   NITRITE NEGATIVE 10/25/2021 1737   LEUKOCYTESUR LARGE (A) 10/25/2021 1737    Sepsis Labs: Lactic Acid, Venous    Component Value Date/Time   LATICACIDVEN 1.1 03/24/2022 0200    MICROBIOLOGY: Recent Results (from the past 240 hour(s))  SARS Coronavirus 2 by RT PCR (hospital order, performed in [place] hospital lab) *cepheid single result test* Anterior Nasal Swab     Status: Abnormal   Collection Time: 03/23/22  9:56 PM   Specimen: Anterior Nasal Swab  Result Value Ref Range Status   SARS Coronavirus 2 by RT PCR POSITIVE (A) NEGATIVE Final    Comment: (NOTE) SARS-CoV-2 target nucleic acids are DETECTED  SARS-CoV-2 RNA is generally detectable in upper respiratory specimens  during the acute phase of infection.  Positive results are indicative  of the presence of the identified virus, but do not rule out bacterial infection or co-infection with other pathogens not detected by the test.  Clinical correlation with patient history and  other diagnostic information is necessary to determine patient infection status.  The expected result is negative.  Fact Sheet for Patients:   https://www.patel.info/   Fact Sheet for Healthcare Providers:   https://hall.com/    This test is not yet approved or cleared by the [place] FDA and   has been authorized for detection and/or diagnosis of SARS-CoV-2 by FDA under an Emergency Use Authorization (EUA).  This EUA will remain in effect (meaning this test can be used) for the duration of  the COVID-19 declaration under Section 564(b)(1)  of the Act, 21 U.S.C. section 360-bbb-3(b)(1), unless the authorization is terminated or revoked sooner.   Performed at [facility]  [facility], [place] [address] [place], [place] [address]   Blood culture (routine x 2)     Status: None   Collection Time: 03/23/22 10:03 PM   Specimen: BLOOD  Result Value Ref Range Status   Specimen Description BLOOD LEFT ANTECUBITAL  Final   Special Requests   Final    BOTTLES DRAWN AEROBIC AND ANAEROBIC Blood Culture adequate volume   Culture   Final    NO GROWTH 5 DAYS Performed at [facility], [place] [address], [place], [place] [address]    Report Status 03/28/2022 FINAL  Final  Blood culture (routine x 2)     Status: None   Collection Time: 03/23/22 10:09 PM   Specimen: BLOOD  Result Value Ref Range Status   Specimen Description BLOOD RIGHT ANTECUBITAL  Final   Special Requests   Final    BOTTLES DRAWN AEROBIC AND ANAEROBIC Blood Culture results may not be optimal due to an inadequate volume of blood received in culture bottles   Culture   Final    NO GROWTH 5 DAYS Performed at [facility], [place] [address] [place], [place] [address]    Report Status 03/28/2022 FINAL  Final    RADIOLOGY STUDIES/RESULTS: No results found.   LOS: 7 days   Oren Binet, MD  [facility]    To contact the attending provider between 7A-7P or the covering provider during after hours 7P-7A, please log into the web site [email or web address] and access using universal [place] password for that web site. If you do not have the password, please call the hospital operator.  03/31/2022, 1:58 PM

## 2022-03-31 NOTE — TOC Initial Note (Signed)
Transition of Care [facility]) - Initial/Assessment Note    Patient Details  Name: Stacey Davenport MRN: [id number] Date of Birth: Sep 06, 1940  Transition of [facility]) CM/SW Contact:    Benard Halsted, [place] Phone Number: 03/31/2022, 3:04 PM  Clinical Narrative:                 CSW following patient for discharge needs back to [facility] once medically stable. Will continue to update [facility] as needed.   Expected Discharge Plan: Skilled Nursing Facility Barriers to Discharge: Continued Medical Work up   Patient Goals and CMS Choice Patient states their goals for this hospitalization and ongoing recovery are:: Rehab CMS Medicare.gov Compare Post Acute Care list provided to:: Patient Represenative (must comment) Choice offered to / list presented to : Adult Children  Expected Discharge Plan and Services Expected Discharge Plan: [place] In-house Referral: Clinical Social Work   Post Acute Care Choice: [place] Living arrangements for the past 2 months: [place]                                      Prior Living Arrangements/Services Living arrangements for the past 2 months: [place] Lives with:: Facility Resident Patient language and need for interpreter reviewed:: Yes Do you feel safe going back to the place where you live?: Yes      Need for Family Participation in Patient Care: Yes (Comment) Care giver support system in place?: Yes (comment)   Criminal Activity/Legal Involvement Pertinent to Current Situation/Hospitalization: No - Comment as needed  Activities of Daily Living      Permission Sought/Granted Permission sought to share information with : Facility Sport and exercise psychologist, Family Supports Permission granted to share information with : Yes, Verbal Permission Granted  Share Information with NAME: Shanon Brow  Permission granted to share info w AGENCY: [facility]  Permission granted to share info w  Relationship: Son  Permission granted to share info w Contact Information: [phone]  Emotional Assessment Appearance:: Appears stated age Attitude/Demeanor/Rapport: Unable to Assess Affect (typically observed): Unable to Assess Orientation: : Oriented to Self, Oriented to Place Alcohol / Substance Use: Not Applicable Psych Involvement: No (comment)  Admission diagnosis:  Pneumonia due to COVID-19 virus [U07.1, J12.82] Patient Active Problem List   Diagnosis Date Noted   Pneumonia due to COVID-19 virus 03/24/2022   Chronic diastolic CHF (congestive heart failure) ([place]) 03/24/2022   Leg swelling 11/30/2021   Mild protein-calorie malnutrition ([place]) 10/22/2021   Depression 10/22/2021   Elevated brain natriuretic peptide (BNP) level 10/22/2021   GI bleed 10/22/2021   Anticoagulated    Sagittal band rupture, extensor tendon, nontraumatic, left 12/30/2020   Irregular heart beat 08/15/2017   Primary open angle glaucoma of both eyes, mild stage 04/11/2017   High risk medication use 09/03/2013   Multiple drug allergies 09/03/2013   Osteoporosis, 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 [place], [place] [address] Phone: [phone] Fax: [place], [place]  [address] [place] [address] Phone: [phone] Fax: [place], [place] [address] Phone: [phone] Fax:  [phone]     Social Determinants of Health (SDOH) Interventions    Readmission Risk Interventions     No data to display

## 2022-03-31 NOTE — Progress Notes (Signed)
   Palliative Medicine Inpatient Follow Up Note   HPI: Patient is a 81 y.o.  female with history of  asthma-on chronic steroids, HTN, HLD, CVA, dementia, PAF on Xarelto-who tested positive for COVID-19 at SNF on 8/23-presented to the hospital with worsening shortness of breath/fatigue-found to have acute hypoxic respiratory failure due to COVID-19 pneumonia.   Palliative care was consulted for goals of care in the setting of severe COVID PNA.   Today's Discussion 03/31/2022  *Please note that this is a verbal dictation therefore any spelling or grammatical errors are due to the "[place] One" system interpretation.  Chart reviewed inclusive of vital signs, progress notes, laboratory results, and diagnostic images.   I met at bedside with Marrion this morning. She shares that she has the urge to have a bowel movement. I helped her to the commode, where she sat for roughly 20 minutes. She was not able to have a bowel movement though did void. She shares that she is hoping to get something to help "push" and provide relief of constipation. We reviewed that a suppository would be ordered. Io does continue to complain of nausea though it is less than yesterday per her vocalization. She shares that she feels "about the same". We reviewed that her oxygen need has decreased quite a bit since yesterday which is a reassuring sign.   While in the room, patients son called and was able to speak to patient.   Discussed the plan to continue present treatments with the goals of improvement.  Questions and concerns addressed/Palliative Support Provided.   Objective Assessment: Vital Signs Vitals:   03/31/22 0430 03/31/22 0727  BP: (!) 161/56 (!) 151/56  Pulse: (!) 52 (!) 57  Resp: 18 (!) 21  Temp:  97.7 F (36.5 C)  SpO2:  98%    Intake/Output Summary (Last 24 hours) at 03/31/2022 1059 Last data filed at 03/30/2022 2158 Gross per 24 hour  Intake 100 ml  Output 300 ml  Net -200 ml   Gen:   Frail elderly caucasian F in NAD HEENT: dry mucus membranes CV: Regular rate and rhythm, no murmurs rubs or gallops PULM: On 5LPM [place] Wheezing noted in bilateral lung fields ABD: soft/nontender/nondistended/normal bowel sounds EXT: No edema Neuro: Alert and oriented x3  SUMMARY OF RECOMMENDATIONS   - Change CODE STATUS to DNR --> in the event of deterioration, would pursue comfort/ hospice route - Continue inpatient medical treatment for pneumonia --> Watchful waiting for improvements - O2 needs continue to decrease - OP Palliative care on DC - Will need short term SNF placement for strengthening - Continue zofran for nausea - Bisacodyl suppository x1 - PMT will continue to follow incrementally  Billing based on MDM: High ______________________________________________________________________________________ [place] Team Team Cell Phone: [phone] Please utilize secure chat with additional questions, if there is no response within 30 minutes please call the above phone number  Palliative Medicine Team providers are available by phone from 7am to 7pm daily and can be reached through the team cell phone.  Should this patient require assistance outside of these hours, please call the patient's attending physician.

## 2022-04-01 DIAGNOSIS — I10 Essential (primary) hypertension: Secondary | ICD-10-CM | POA: Diagnosis not present

## 2022-04-01 DIAGNOSIS — I5032 Chronic diastolic (congestive) heart failure: Secondary | ICD-10-CM | POA: Diagnosis not present

## 2022-04-01 DIAGNOSIS — J45909 Unspecified asthma, uncomplicated: Secondary | ICD-10-CM | POA: Diagnosis not present

## 2022-04-01 DIAGNOSIS — U071 COVID-19: Secondary | ICD-10-CM | POA: Diagnosis not present

## 2022-04-01 LAB — GLUCOSE, CAPILLARY
Glucose-Capillary: 98 mg/dL (ref 70–99)
Glucose-Capillary: 208 mg/dL — ABNORMAL HIGH (ref 70–99)
Glucose-Capillary: 350 mg/dL — ABNORMAL HIGH (ref 70–99)
Glucose-Capillary: 211 mg/dL — ABNORMAL HIGH (ref 70–99)

## 2022-04-01 MED ORDER — HYDRALAZINE HCL 20 MG/ML IJ SOLN
5.0000 mg | Freq: Once | INTRAMUSCULAR | Status: AC | PRN
  Administered 2022-04-01: 5 mg via INTRAVENOUS
  Filled 2022-04-01: qty 1

## 2022-04-01 MED ORDER — FUROSEMIDE 40 MG PO TABS
40.0000 mg | ORAL_TABLET | Freq: Every day | ORAL | Status: DC
  Administered 2022-04-01 – 2022-04-04 (×4): 40 mg via ORAL
  Filled 2022-04-01 (×4): qty 1

## 2022-04-01 MED ORDER — MENTHOL 3 MG MT LOZG
1.0000 | LOZENGE | OROMUCOSAL | Status: DC | PRN
Start: 2022-04-01 — End: 2022-04-05

## 2022-04-01 MED ORDER — PREDNISONE 20 MG PO TABS
50.0000 mg | ORAL_TABLET | Freq: Every day | ORAL | Status: DC
  Administered 2022-04-02 – 2022-04-03 (×2): 50 mg via ORAL
  Filled 2022-04-01 (×2): qty 2

## 2022-04-01 NOTE — Inpatient Diabetes Management (Signed)
Inpatient Diabetes Program Recommendations  AACE/ADA: New Consensus Statement on Inpatient Glycemic Control (2015)  Target Ranges:  Prepandial:   less than 140 mg/dL      Peak postprandial:   less than 180 mg/dL (1-2 hours)      Critically ill patients:  140 - 180 mg/dL   Lab Results  Component Value Date   GLUCAP 350 (H) 04/01/2022   HGBA1C 5.8 (H) 03/28/2022    Review of Glycemic Control  Latest Reference Range & Units 04/01/22 07:52 04/01/22 11:12 04/01/22 15:21  Glucose-Capillary 70 - 99 mg/dL 98 208 (H) 350 (H)   COVID + Current orders for Inpatient glycemic control:  Novolog 0-9 units tid + hs  PO prednisone 50 mg Daily  Inpatient Diabetes Program Recommendations:    -  Consider increasing Novolog to 0-15 units tid + hs -  Add Novolog 3 units tid meal coverage while on steroids  Thanks,  Tama Headings RN, MSN, BC-ADM Inpatient Diabetes Coordinator Team Pager [phone] (8a-5p)

## 2022-04-01 NOTE — Progress Notes (Signed)
PROGRESS NOTE        PATIENT DETAILS Name: Stacey Davenport Age: 81 y.o. Sex: female Date of Birth: 04-03-1941 Admit Date: 03/23/2022 Admitting Physician Vianne Bulls, MD UUE:[id number], Angelyn Punt, MD  Brief Summary: Patient is a 81 y.o.  female with history of  asthma-on chronic steroids, HTN, HLD, CVA, dementia, PAF on Xarelto-who tested positive for COVID-19 at SNF on 8/23-presented to the hospital with worsening shortness of breath/fatigue-found to have acute hypoxic respiratory failure due to COVID-19 pneumonia and subsequently admitted to the hospitalist service.  Spittle course complicated by worsening hypoxia due to aspiration pneumonia.  See below for further details.  Significant events: 8/23>> COVID-19 positive at SNF 8/29>> admit-hypoxemia-COVID-19 pneumonia. 9/03>> worsening hypoxemia-suspicion for aspiration. 9/04>> on NRB-started Unasyn-restarted IV steroids 9/05>> improved-on 4 L of HFNC.  Significant studies: 8/29>> CXR: Airspace opacities in the right lower lung. 9/04>> CXR: Patchy bilateral airspace disease-progressive in the right upper lobe.  Significant microbiology data: 8/29>> COVID-19 PCR: Positive 8/29>> blood cultures: No growth  Procedures: None  Consults: Palliative care  Subjective: No major issues overnight.  Not sure why she was on 6 L overnight-back on 3 L of oxygen this morning.  Some cough continues.  Frail looking.  Objective: Vitals: Blood pressure 128/66, pulse 60, temperature 98.2 F (36.8 C), temperature source Oral, resp. rate (!) 22, SpO2 95 %.   Exam: Gen Exam:Alert awake-not in any distress-frail chronically sick appearing. HEENT:atraumatic, normocephalic Chest: Some rales in the right base area. CVS:S1S2 regular Abdomen:soft non tender, non distended Extremities:no edema Neurology: Non focal Skin: no rash   Pertinent Labs/Radiology:    Latest Ref Rng & Units 03/31/2022    9:43 AM 03/29/2022    3:34  AM 03/28/2022    3:50 AM  CBC  WBC 4.0 - 10.5 K/uL 13.7  17.5  15.9   Hemoglobin 12.0 - 15.0 g/dL 13.0  13.6  13.1   Hematocrit 36.0 - 46.0 % 41.1  40.6  40.7   Platelets 150 - 400 K/uL 223  199  192     Lab Results  Component Value Date   NA 141 03/31/2022   K 4.0 03/31/2022   CL 103 03/31/2022   CO2 29 03/31/2022      Assessment/Plan: Acute hypoxic respiratory failure due to COVID-19 pneumonia-and  superimposed aspiration pneumonia: Overall better-unclear why she was on 6 L of oxygen overnight-stable on 3 L this morning-continue Unasyn-switch from IV to Solu-Medrol to prednisone.  Volume status stable-on oral furosemide now.  Thrombocytopenia: Mild-due to COVID-resolved.   PAF: Maintaining sinus rhythm-continue amiodarone-continue Xarelto.  Chronic diastolic heart failure: Remains euvolemic-continue IV Lasix to ensure negative balance.  Steroid-dependent asthma: Not in exacerbation-continue bronchodilators-on steroids chronically.  Dementia: Seems to be mildly/pleasantly confused-no family at bedside-but suspect not far from baseline.  Maintain delirium precautions.  GERD: Continue PPI  Constipation: Continue MiraLAX/senna.  Palliative care: Thankfully has improved overnight-now DNR.  Plans are to continue supportive care and see if she improves-if she were to deteriorate-she is open to transitioning to full comfort measures.  Appreciate palliative care input. BMI: Estimated body mass index is 24.27 kg/m as calculated from the following:   Height as of 02/02/22: '5\' 3"'$  (1.6 m).   Weight as of 02/02/22: 62.1 kg.   Code status:   Code Status: DNR   DVT Prophylaxis: rivaroxaban (XARELTO) tablet 20  mg     Family Communication: Son-David Widener-[phone]-updated over the phone on 9/7   Disposition Plan: Status is: Inpatient Remains inpatient appropriate because: Improving hypoxemia-needs inpatient optimization before consideration of discharge.   Planned Discharge  Destination:Skilled nursing facility in the next 1-2 days.   Diet: Diet Order             DIET SOFT Room service appropriate? Yes; Fluid consistency: Thin  Diet effective now                     Antimicrobial agents: Anti-infectives (From admission, onward)    Start     Dose/Rate Route Frequency Ordered Stop   03/29/22 1515  ampicillin-sulbactam (UNASYN) 1.5 g in sodium chloride 0.9 % 100 mL IVPB        1.5 g 200 mL/hr over 30 Minutes Intravenous Every 6 hours 03/29/22 1417 04/03/22 1514   03/24/22 1000  remdesivir 100 mg in sodium chloride 0.9 % 100 mL IVPB       See Hyperspace for full Linked Orders Report.   100 mg 200 mL/hr over 30 Minutes Intravenous Daily 03/23/22 2336 03/25/22 0942   03/24/22 0015  doxycycline (VIBRAMYCIN) 100 mg in sodium chloride 0.9 % 250 mL IVPB  Status:  Discontinued        100 mg 125 mL/hr over 120 Minutes Intravenous Every 12 hours 03/24/22 0008 03/24/22 0043   03/24/22 0000  doxycycline (VIBRA-TABS) tablet 100 mg  Status:  Discontinued        100 mg Oral  Once 03/23/22 2355 03/24/22 0005   03/23/22 2345  remdesivir 200 mg in sodium chloride 0.9% 250 mL IVPB       See Hyperspace for full Linked Orders Report.   200 mg 580 mL/hr over 30 Minutes Intravenous Once 03/23/22 2336 03/24/22 0247        MEDICATIONS: Scheduled Meds:  amiodarone  200 mg Oral Daily   arformoterol  15 mcg Nebulization BID   brimonidine  1 drop Left Eye TID   budesonide (PULMICORT) nebulizer solution  0.25 mg Nebulization BID   furosemide  40 mg Oral Daily   guaiFENesin  1,200 mg Oral BID   insulin aspart  0-5 Units Subcutaneous QHS   insulin aspart  0-9 Units Subcutaneous TID WC   irbesartan  37.5 mg Oral Daily   latanoprost  1 drop Both Eyes QHS   levothyroxine  75 mcg Oral QAC breakfast   methylPREDNISolone (SOLU-MEDROL) injection  120 mg Intravenous Q24H   pantoprazole  40 mg Oral BID   polyethylene glycol  17 g Oral Daily   revefenacin  175 mcg  Nebulization Daily   rivaroxaban  20 mg Oral Q supper   senna-docusate  2 tablet Oral QHS   sodium chloride flush  3 mL Intravenous Q12H   Continuous Infusions:  ampicillin-sulbactam (UNASYN) IV 1.5 g (04/01/22 0936)    PRN Meds:.acetaminophen, bisacodyl, diphenhydrAMINE, levalbuterol, LORazepam, magnesium hydroxide, ondansetron (ZOFRAN) IV, polyvinyl alcohol, traMADol   I have personally reviewed following labs and imaging studies  LABORATORY DATA: CBC: Recent Labs  Lab 03/26/22 0808 03/27/22 0552 03/28/22 0350 03/29/22 0334 03/31/22 0943  WBC 19.5* 19.1* 15.9* 17.5* 13.7*  NEUTROABS 18.3* 17.7* 14.8* 16.0*  --   HGB 13.5 13.4 13.1 13.6 13.0  HCT 40.7 40.8 40.7 40.6 41.1  MCV 90.4 90.1 91.1 90.4 93.4  PLT 182 165 192 199 223     Basic Metabolic Panel: Recent Labs  Lab 03/27/22 0552 03/28/22 0350  03/29/22 0334 03/30/22 0622 03/31/22 0943  NA 143 140 143 146* 141  K 3.6 3.4* 4.0 3.8 4.0  CL 107 106 107 104 103  CO2 '23 25 28 31 29  '$ GLUCOSE 200* 185* 130* 179* 161*  BUN 34* 29* 26* 23 28*  CREATININE 0.77 0.69 0.67 0.68 0.62  CALCIUM 8.6* 8.2* 8.3* 8.4* 8.2*  MG  --  2.2 2.2  --   --      GFR: CrCl cannot be calculated (Unknown ideal weight.).  Liver Function Tests: Recent Labs  Lab 03/26/22 0808 03/27/22 0552 03/28/22 0350 03/29/22 0334  AST '17 17 15 19  '$ ALT '22 22 23 '$ 32  ALKPHOS 43 47 48 48  BILITOT 0.7 0.4 0.4 0.5  PROT 5.0* 5.1* 4.9* 4.9*  ALBUMIN 2.3* 2.3* 2.2* 2.2*    No results for input(s): "LIPASE", "AMYLASE" in the last 168 hours. No results for input(s): "AMMONIA" in the last 168 hours.  Coagulation Profile: No results for input(s): "INR", "PROTIME" in the last 168 hours.  Cardiac Enzymes: No results for input(s): "CKTOTAL", "CKMB", "CKMBINDEX", "TROPONINI" in the last 168 hours.  BNP (last 3 results) No results for input(s): "PROBNP" in the last 8760 hours.  Lipid Profile: No results for input(s): "CHOL", "HDL", "LDLCALC",  "TRIG", "CHOLHDL", "LDLDIRECT" in the last 72 hours.  Thyroid Function Tests: No results for input(s): "TSH", "T4TOTAL", "FREET4", "T3FREE", "THYROIDAB" in the last 72 hours.  Anemia Panel: No results for input(s): "VITAMINB12", "FOLATE", "FERRITIN", "TIBC", "IRON", "RETICCTPCT" in the last 72 hours.  Urine analysis:    Component Value Date/Time   COLORURINE AMBER (A) 10/25/2021 1737   APPEARANCEUR TURBID (A) 10/25/2021 1737   LABSPEC 1.021 10/25/2021 1737   PHURINE 7.0 10/25/2021 1737   GLUCOSEU NEGATIVE 10/25/2021 1737   HGBUR SMALL (A) 10/25/2021 1737   BILIRUBINUR NEGATIVE 10/25/2021 1737   KETONESUR NEGATIVE 10/25/2021 1737   PROTEINUR 100 (A) 10/25/2021 1737   UROBILINOGEN 0.2 04/20/2012 1605   NITRITE NEGATIVE 10/25/2021 1737   LEUKOCYTESUR LARGE (A) 10/25/2021 1737    Sepsis Labs: Lactic Acid, Venous    Component Value Date/Time   LATICACIDVEN 1.1 03/24/2022 0200    MICROBIOLOGY: Recent Results (from the past 240 hour(s))  SARS Coronavirus 2 by RT PCR (hospital order, performed in [facility] lab) *cepheid single result test* Anterior Nasal Swab     Status: Abnormal   Collection Time: 03/23/22  9:56 PM   Specimen: Anterior Nasal Swab  Result Value Ref Range Status   SARS Coronavirus 2 by RT PCR POSITIVE (A) NEGATIVE Final    Comment: (NOTE) SARS-CoV-2 target nucleic acids are DETECTED  SARS-CoV-2 RNA is generally detectable in upper respiratory specimens  during the acute phase of infection.  Positive results are indicative  of the presence of the identified virus, but do not rule out bacterial infection or co-infection with other pathogens not detected by the test.  Clinical correlation with patient history and  other diagnostic information is necessary to determine patient infection status.  The expected result is negative.  Fact Sheet for Patients:   https://www.patel.info/   Fact Sheet for Healthcare Providers:    https://hall.com/    This test is not yet approved or cleared by the [place] FDA and  has been authorized for detection and/or diagnosis of SARS-CoV-2 by FDA under an Emergency Use Authorization (EUA).  This EUA will remain in effect (meaning this test can be used) for the duration of  the COVID-19 declaration under Section 564(b)(1)  of the Act, 21 U.S.C. section 360-bbb-3(b)(1), unless the authorization is terminated or revoked sooner.   Performed at [facility], [place] [address], [place], [place] [address]   Blood culture (routine x 2)     Status: None   Collection Time: 03/23/22 10:03 PM   Specimen: BLOOD  Result Value Ref Range Status   Specimen Description BLOOD LEFT ANTECUBITAL  Final   Special Requests   Final    BOTTLES DRAWN AEROBIC AND ANAEROBIC Blood Culture adequate volume   Culture   Final    NO GROWTH 5 DAYS Performed at [facility], [place] [address] [place], [place] [address]    Report Status 03/28/2022 FINAL  Final  Blood culture (routine x 2)     Status: None   Collection Time: 03/23/22 10:09 PM   Specimen: BLOOD  Result Value Ref Range Status   Specimen Description BLOOD RIGHT ANTECUBITAL  Final   Special Requests   Final    BOTTLES DRAWN AEROBIC AND ANAEROBIC Blood Culture results may not be optimal due to an inadequate volume of blood received in culture bottles   Culture   Final    NO GROWTH 5 DAYS Performed at [facility], [place] [address] [place], [place] [address]    Report Status 03/28/2022 FINAL  Final    RADIOLOGY STUDIES/RESULTS: No results found.   LOS: 8 days   Oren Binet, MD  [facility]    To contact the attending provider between 7A-7P or the covering provider during after hours 7P-7A, please log into the web site [email or web address] and access using universal [place] password for that web site. If you do not have the password, please call the hospital operator.  04/01/2022,  11:50 AM

## 2022-04-01 NOTE — Plan of Care (Signed)
  Problem: Coping: Goal: Psychosocial and spiritual needs will be supported Outcome: Progressing   Problem: Respiratory: Goal: Will maintain a patent airway Outcome: Progressing Goal: Complications related to the disease process, condition or treatment will be avoided or minimized Outcome: Progressing   Problem: Education: Goal: Knowledge of General Education information will improve Description: Including pain rating scale, medication(s)/side effects and non-pharmacologic comfort measures Outcome: Progressing   Problem: Health Behavior/Discharge Planning: Goal: Ability to manage health-related needs will improve Outcome: Progressing   Problem: Clinical Measurements: Goal: Ability to maintain clinical measurements within normal limits will improve Outcome: Progressing Goal: Will remain free from infection Outcome: Progressing Goal: Diagnostic test results will improve Outcome: Progressing Goal: Respiratory complications will improve Outcome: Progressing Goal: Cardiovascular complication will be avoided Outcome: Progressing   Problem: Activity: Goal: Risk for activity intolerance will decrease Outcome: Progressing   Problem: Nutrition: Goal: Adequate nutrition will be maintained Outcome: Progressing   Problem: Coping: Goal: Level of anxiety will decrease Outcome: Progressing   Problem: Elimination: Goal: Will not experience complications related to bowel motility Outcome: Progressing Goal: Will not experience complications related to urinary retention Outcome: Progressing   Problem: Pain Managment: Goal: General experience of comfort will improve Outcome: Progressing   Problem: Safety: Goal: Ability to remain free from injury will improve Outcome: Progressing   Problem: Skin Integrity: Goal: Risk for impaired skin integrity will decrease Outcome: Progressing   Problem: Education: Goal: Ability to describe self-care measures that may prevent or decrease  complications (Diabetes Survival Skills Education) will improve Outcome: Progressing Goal: Individualized Educational Video(s) Outcome: Progressing   Problem: Coping: Goal: Ability to adjust to condition or change in health will improve Outcome: Progressing   Problem: Fluid Volume: Goal: Ability to maintain a balanced intake and output will improve Outcome: Progressing   Problem: Health Behavior/Discharge Planning: Goal: Ability to identify and utilize available resources and services will improve Outcome: Progressing Goal: Ability to manage health-related needs will improve Outcome: Progressing   Problem: Metabolic: Goal: Ability to maintain appropriate glucose levels will improve Outcome: Progressing   Problem: Nutritional: Goal: Maintenance of adequate nutrition will improve Outcome: Progressing Goal: Progress toward achieving an optimal weight will improve Outcome: Progressing   Problem: Skin Integrity: Goal: Risk for impaired skin integrity will decrease Outcome: Progressing   Problem: Tissue Perfusion: Goal: Adequacy of tissue perfusion will improve Outcome: Progressing

## 2022-04-01 NOTE — Progress Notes (Signed)
Physical Therapy Treatment Patient Details Name: Stacey Davenport MRN: [id number] DOB: September 03, 1940 Today's Date: 04/01/2022   History of Present Illness Pt is an 81 year old woman admitted on 03/23/22 from [facility] with SOB, fatigue, hypoxic respiratory failure secondary to COVID 19. PMH: dementia, HTN, HLD, asthma, PAF, hiatal hernia.    PT Comments    Pt reporting feeling much weaker today, requires mod encouragement to mobilize OOB. Pt overall requiring moderate physical assist for transfer-level mobility, could not progress to gait given fatigue and heavily forward flexed trunk. Pt with increased fear of falling today, requires max cuing and hip physical assist to complete transfer from [facility] to recliner. Pt's family in room at end of session, supportive. PT encouraged pt to get OOB with staff daily for pulmonary health and strength, pt expresses understanding. PT to continue to follow.     Recommendations for follow up therapy are one component of a multi-disciplinary discharge planning process, led by the attending physician.  Recommendations may be updated based on patient status, additional functional criteria and insurance authorization.  Follow Up Recommendations  Skilled nursing-short term rehab (<3 hours/day) Can patient physically be transported by private vehicle: No   Assistance Recommended at Discharge Intermittent Supervision/Assistance  Patient can return home with the following A lot of help with walking and/or transfers;A lot of help with bathing/dressing/bathroom   Equipment Recommendations  None recommended by PT    Recommendations for Other Services       Precautions / Restrictions Precautions Precautions: Fall Restrictions Weight Bearing Restrictions: No     Mobility  Bed Mobility Overal bed mobility: Needs Assistance Bed Mobility: Supine to Sit     Supine to sit: Mod assist     General bed mobility comments: assist for trunk elevation, scooting to  EOB.    Transfers Overall transfer level: Needs assistance Equipment used: Rolling walker (2 wheels) Transfers: Sit to/from Stand, Bed to chair/wheelchair/BSC Sit to Stand: Mod assist   Step pivot transfers: Mod assist       General transfer comment: assist for power up, rise, steadying, and pivotal steps to reach [facility], then recliner. STS x3, from EOB, BSC, and recliner.    Ambulation/Gait               General Gait Details: nt   Marine scientist Rankin (Stroke Patients Only)       Balance Overall balance assessment: Needs assistance Sitting-balance support: Feet supported, No upper extremity supported Sitting balance-Leahy Scale: Fair     Standing balance support: Bilateral upper extremity supported Standing balance-Leahy Scale: Poor Standing balance comment: reliant on B UE and external support; heavily forward flexed               High Level Balance Comments: Completed marching in place            Cognition Arousal/Alertness: Awake/alert Behavior During Therapy: WFL for tasks assessed/performed Overall Cognitive Status: History of cognitive impairments - at baseline                                 General Comments: memory deficits, anxiety with mobility today and fear of falling        Exercises      General Comments General comments (skin integrity, edema, etc.): on 4LO2, VSS. Pt's IV pump grazed the left side  of her head during transfer from [facility] to recliner, no harm and no redness/swelling/pain, RN notified and safety zone completed      Pertinent Vitals/Pain Pain Assessment Pain Assessment: Faces Faces Pain Scale: No hurt Pain Intervention(s): Monitored during session    Home Living                          Prior Function            PT Goals (current goals can now be found in the care plan section) Acute Rehab PT Goals PT Goal Formulation: With patient Time  For Goal Achievement: 04/08/22 Potential to Achieve Goals: Good Progress towards PT goals: Progressing toward goals    Frequency    Min 2X/week      PT Plan Current plan remains appropriate    Co-evaluation              AM-PAC PT "6 Clicks" Mobility   Outcome Measure  Help needed turning from your back to your side while in a flat bed without using bedrails?: A Lot Help needed moving from lying on your back to sitting on the side of a flat bed without using bedrails?: A Lot Help needed moving to and from a bed to a chair (including a wheelchair)?: A Lot Help needed standing up from a chair using your arms (e.g., wheelchair or bedside chair)?: A Lot Help needed to walk in hospital room?: A Lot Help needed climbing 3-5 steps with a railing? : A Lot 6 Click Score: 12    End of Session Equipment Utilized During Treatment: Oxygen Activity Tolerance: Patient limited by fatigue Patient left: with call bell/phone within reach;in chair;with chair alarm set Nurse Communication: Mobility status PT Visit Diagnosis: Other abnormalities of gait and mobility (R26.89);Muscle weakness (generalized) (M62.81)     Time: 4008-6761 PT Time Calculation (min) (ACUTE ONLY): 33 min  Charges:  $Therapeutic Activity: 23-37 mins                     Stacie Glaze, PT DPT Acute Rehabilitation Services Pager [phone]  Office [phone]    Roxine Caddy E Ruffin Pyo 04/01/2022, 3:43 PM

## 2022-04-02 DIAGNOSIS — Z515 Encounter for palliative care: Secondary | ICD-10-CM | POA: Diagnosis not present

## 2022-04-02 DIAGNOSIS — I5032 Chronic diastolic (congestive) heart failure: Secondary | ICD-10-CM | POA: Diagnosis not present

## 2022-04-02 DIAGNOSIS — U071 COVID-19: Secondary | ICD-10-CM | POA: Diagnosis not present

## 2022-04-02 DIAGNOSIS — I480 Paroxysmal atrial fibrillation: Secondary | ICD-10-CM | POA: Diagnosis not present

## 2022-04-02 DIAGNOSIS — J45909 Unspecified asthma, uncomplicated: Secondary | ICD-10-CM | POA: Diagnosis not present

## 2022-04-02 LAB — BASIC METABOLIC PANEL
Anion gap: 9 (ref 5–15)
BUN: 22 mg/dL (ref 8–23)
CO2: 28 mmol/L (ref 22–32)
Calcium: 7.9 mg/dL — ABNORMAL LOW (ref 8.9–10.3)
Chloride: 104 mmol/L (ref 98–111)
Creatinine, Ser: 0.59 mg/dL (ref 0.44–1.00)
GFR, Estimated: >60 mL/min (ref >60)
Glucose, Bld: 91 mg/dL (ref 70–99)
Potassium: 3.8 mmol/L (ref 3.5–5.1)
Sodium: 141 mmol/L (ref 135–145)

## 2022-04-02 LAB — GLUCOSE, CAPILLARY
Glucose-Capillary: 91 mg/dL (ref 70–99)
Glucose-Capillary: 134 mg/dL — ABNORMAL HIGH (ref 70–99)
Glucose-Capillary: 288 mg/dL — ABNORMAL HIGH (ref 70–99)
Glucose-Capillary: 300 mg/dL — ABNORMAL HIGH (ref 70–99)

## 2022-04-02 LAB — C-REACTIVE PROTEIN: CRP: 4 mg/dL — ABNORMAL HIGH (ref <1.0)

## 2022-04-02 MED ORDER — BISACODYL 10 MG RE SUPP: 10.0000 mg | Freq: Once | RECTAL | Status: DC

## 2022-04-02 MED ORDER — FUROSEMIDE 40 MG PO TABS: 40.0000 mg | ORAL_TABLET | Freq: Every day | ORAL | Status: DC

## 2022-04-02 MED ORDER — TRAMADOL HCL 50 MG PO TABS: 25.0000 mg | ORAL_TABLET | Freq: Two times a day (BID) | ORAL | 0 refills | Status: DC | PRN

## 2022-04-02 MED ORDER — LEVALBUTEROL HCL 0.63 MG/3ML IN NEBU
0.6300 mg | INHALATION_SOLUTION | Freq: Four times a day (QID) | RESPIRATORY_TRACT | 12 refills | Status: DC | PRN
Start: 2022-04-02 — End: 2023-11-22

## 2022-04-02 MED ORDER — SENNOSIDES-DOCUSATE SODIUM 8.6-50 MG PO TABS: 2.0000 | ORAL_TABLET | Freq: Every day | ORAL | Status: DC

## 2022-04-02 MED ORDER — LORAZEPAM 0.5 MG PO TABS: 0.5000 mg | ORAL_TABLET | Freq: Every day | ORAL | 0 refills | Status: DC

## 2022-04-02 MED ORDER — MENTHOL 3 MG MT LOZG: 1.0000 | LOZENGE | OROMUCOSAL | 12 refills | Status: DC | PRN

## 2022-04-02 MED ORDER — ONDANSETRON HCL 4 MG PO TABS: 4.0000 mg | ORAL_TABLET | Freq: Every day | ORAL | 1 refills | Status: AC | PRN

## 2022-04-02 MED ORDER — AMOXICILLIN-POT CLAVULANATE 875-125 MG PO TABS: 1.0000 | ORAL_TABLET | Freq: Two times a day (BID) | ORAL | 0 refills | Status: AC

## 2022-04-02 MED ORDER — GUAIFENESIN ER 600 MG PO TB12: 1200.0000 mg | ORAL_TABLET | Freq: Two times a day (BID) | ORAL | Status: DC

## 2022-04-02 MED ORDER — PREDNISONE 5 MG PO TABS
ORAL_TABLET | ORAL | Status: DC
Start: 2022-04-02 — End: 2022-05-04

## 2022-04-02 MED ORDER — BISACODYL 10 MG RE SUPP: 10.0000 mg | Freq: Every day | RECTAL | 0 refills | Status: DC | PRN

## 2022-04-02 NOTE — Progress Notes (Signed)
Speech Language Pathology Treatment: Dysphagia  Patient Details Name: Stacey Davenport MRN: [id number] DOB: March 27, 1941 Today's Date: 04/02/2022 Time: 6751-9824 SLP Time Calculation (min) (ACUTE ONLY): 18 min  Assessment / Plan / Recommendation Clinical Impression  Stacey Davenport was alert and interactive today. Respiratory status is much improved, leading to improved coordination of swallowing/respirations with no s/s of airway intrusion when drinking/eating. She drank sequential sips of water, consumed pill whole with water, and self-fed crackers with no further concerns for dysphagia. Her daughter was at bedside.  Recommend advancing diet to regular solids; continue thin liquids; give meds whole with liquid. No further SLP f/u is needed here nor in next venue of care.  HPI   Patient is an 81 y.o. female with PMH: large hiatal hernia, dementia, HTN, HLD, CVA, asthma on chronic steroids, PAF on Xarelto. She presentedd to thehe hospital from her SNF ([facility]) with worsening SOB and fatigue and found to have hypoxic respiratory failure due to Covid-19 PNA. CXR showed airspace opacities in the right lung which may be related to edema or infectious etiology. Patient had EGD in March of 2023 which showed a large hiatal hernia and multiple hemorrhagic gastric polyps.     SLP Plan  All goals met      Recommendations for follow up therapy are one component of a multi-disciplinary discharge planning process, led by the attending physician.  Recommendations may be updated based on patient status, additional functional criteria and insurance authorization.    Recommendations  Diet recommendations: Regular;Thin liquid Liquids provided via: Cup;Straw Medication Administration: Whole meds with liquid Supervision: Patient able to self feed                Oral Care Recommendations: Oral care BID Follow Up Recommendations: No SLP follow up SLP Visit Diagnosis: Dysphagia, oral phase  (R13.11) Plan: All goals met         Stacey Tritch L. Stacey Ringer, MA CCC/SLP Clinical Specialist - Acute Care SLP Acute Rehabilitation Services Office number [phone]   Stacey Davenport  04/02/2022, 5:05 PM

## 2022-04-02 NOTE — TOC Transition Note (Signed)
Transition of Care [facility]) - CM/SW Discharge Note   Patient Details  Name: Stacey Davenport MRN: [id number] Date of Birth: 14-Jan-1941  Transition of Care [facility]) CM/SW Contact:  Benard Halsted, [place] Phone Number: 04/02/2022, 11:13 AM   Clinical Narrative:    Patient will DC to: [facility] Anticipated DC date: 04/02/22 Family notified: Left vm for dtr, [place] Transport by: [facility] with O2   Per MD patient ready for DC to [place]. RN to call report prior to discharge [phone]). RN, patient, patient's family, and facility notified of DC. Discharge Summary and FL2 sent to facility. DC packet on chart including signed scripts and DNR.  CSW will sign off for now as social work intervention is no longer needed. Please consult [place] again if new needs arise.     Final next level of care: Skilled Nursing Facility Barriers to Discharge: Barriers Resolved   Patient Goals and CMS Choice Patient states their goals for this hospitalization and ongoing recovery are:: Rehab CMS Medicare.gov Compare Post Acute Care list provided to:: Patient Represenative (must comment) Choice offered to / list presented to : Adult Children  Discharge Placement   Existing PASRR number confirmed : 04/02/22            Patient to be transferred to facility by: [facility] w/O2 Name of family member notified: Daughter Patient and family notified of of transfer: 04/02/22  Discharge Plan and Services In-house Referral: Clinical Social Work   Post Acute Care Choice: [place]                               Social Determinants of Health (SDOH) Interventions     Readmission Risk Interventions     No data to display

## 2022-04-02 NOTE — TOC Progression Note (Signed)
Transition of Care [facility]) - Progression Note    Patient Details  Name: Stacey Davenport MRN: [id number] Date of Birth: [birth year]  Transition of Care [facility]) CM/SW [place], LCSW Phone Number: 04/02/2022, 9:28 AM  Clinical Narrative:    Helene Kelp aware of patient's discharge today. CSW awaiting response from [facility] on if they will pick patient up with oxygen versus approving PTAR for transport.    Expected Discharge Plan: [place] Barriers to Discharge: Continued Medical Work up  Expected Discharge Plan and Services Expected Discharge Plan: [place] In-house Referral: Clinical Social Work   Post Acute Care Choice: [place] Living arrangements for the past 2 months: [place]                                       Social Determinants of Health (SDOH) Interventions    Readmission Risk Interventions     No data to display

## 2022-04-02 NOTE — TOC Progression Note (Signed)
Transition of Care [facility]) - Progression Note    Patient Details  Name: Stacey Davenport MRN: [id number] Date of Birth: Feb 28, 1941  Transition [facility]) CM/SW Contact  Orbie Pyo Phone Number: 04/02/2022, 3:24 PM  Clinical Narrative:      Judithann Graves Appeal Detailed Notice of Discharge letter created and saved: Yes Detailed Notice of Discharge Document Given to Pateint: Yes [facility] ROI Document Created: Yes

## 2022-04-02 NOTE — TOC Progression Note (Addendum)
Transition of Care [facility]) - Progression Note    Patient Details  Name: Stacey Davenport MRN: [id number] Date of Birth: [birth year]  Transition of Care [facility]) CM/SW [place], LCSW Phone Number: 04/02/2022, 1:16 PM  Clinical Narrative:    CSW received notice that patient's sons are requesting an appeal. CSW spoke with Blythe and went over appeal instructions with him. He will contact [place] to file. CSW updated [facility] and [place].   [facility] Case ID: [id number]  Expected Discharge Plan: Skilled Nursing Facility Barriers to Discharge: Barriers Resolved  Expected Discharge Plan and Services Expected Discharge Plan: [place] In-house Referral: Clinical Social Work   Post Acute Care Choice: [place] Living arrangements for the past 2 months: [place] Expected Discharge Date: 04/02/22                                     Social Determinants of Health (SDOH) Interventions    Readmission Risk Interventions     No data to display

## 2022-04-02 NOTE — Discharge Summary (Addendum)
PATIENT DETAILS Name: Stacey Davenport Age: 81 y.o. Sex: female Date of Birth: Mar 15, 1941 MRN: [id number]. Admitting Physician: Vianne Bulls, MD CXK:[id number], Angelyn Punt, MD  Admit Date: 03/23/2022 Discharge date: 04/02/2022  Recommendations for Outpatient Follow-up:  Follow up with PCP in 1-2 weeks Please obtain CMP/CBC in one week Ensure palliative care follow-up at SNF.  She is a DNR-goals of care for general medical treatment-if she were to deteriorate significantly-consider initiation of hospice care.  Admitted From:  SNF  Disposition: Skilled nursing facility   Discharge Condition: fair  CODE STATUS:   Code Status: DNR   Diet recommendation:  Diet Order             Diet - low sodium heart healthy           DIET SOFT Room service appropriate? Yes; Fluid consistency: Thin  Diet effective now                    Brief Summary: Patient is a 81 y.o.  female with history of  asthma-on chronic steroids, HTN, HLD, CVA, dementia, PAF on Xarelto-who tested positive for COVID-19 at SNF on 8/23-presented to the hospital with worsening shortness of breath/fatigue-found to have acute hypoxic respiratory failure due to COVID-19 pneumonia and subsequently admitted to the hospitalist service.  Spittle course complicated by worsening hypoxia due to aspiration pneumonia.  See below for further details.   Significant events: 8/23>> COVID-19 positive at SNF 8/29>> admit-hypoxemia-COVID-19 pneumonia. 9/03>> worsening hypoxemia-suspicion for aspiration. 9/04>> on NRB-started Unasyn-restarted IV steroids 9/05>> improved-on 4 L of HFNC.   Significant studies: 8/29>> CXR: Airspace opacities in the right lower lung. 9/04>> CXR: Patchy bilateral airspace disease-progressive in the right upper lobe.   Significant microbiology data: 8/29>> COVID-19 PCR: Positive 8/29>> blood cultures: No growth   Procedures: None   Consults: Palliative care  Brief Hospital Course: Acute  hypoxic respiratory failure due to COVID-19 pneumonia-and superimposed aspiration pneumonia: Initially treated for COVID-pneumonia with steroids/Remdesivir-she improved-however hospital course was complicated by worsening hypoxemia due to aspiration pneumonia.  She was started on Unasyn-and other supportive care.  Thankfully she started improving again-she is down to 2 L of oxygen appears stable.  We will continue on oral antimicrobial therapy for a few more days, and continue on tapering prednisone until she is back on her usual dose of prednisone.   Thrombocytopenia: Mild-due to COVID-resolved.    PAF: Maintaining sinus rhythm-continue amiodarone-continue Xarelto.   Chronic diastolic heart failure: Remains euvolemic-was maintained on furosemide for negative balance.   Steroid-dependent asthma: Not in exacerbation-continue bronchodilators-on steroids chronically.   Dementia: Seems to be mildly/pleasantly confused-no family at bedside-but suspect not far from baseline.  Maintain delirium precautions.   GERD: Continue PPI   Constipation: Continue MiraLAX/senna.   Palliative care: DNR in place-extensive discussions were held with family when she became more hypoxic-recommendations from palliative care team were to continue with supportive care-and if she were to deteriorate-transition to hospice care.  Would benefit from continued palliative care follow-up at SNF.    BMI: Estimated body mass index is 24.27 kg/m as calculated from the following:   Height as of 02/02/22: '5\' 3"'$  (1.6 m).   Weight as of 02/02/22: 62.1 kg.     Discharge Diagnoses:  Principal Problem:   Pneumonia due to COVID-19 virus Active Problems:   Hypertension   Asthma, chronic, steroid-dependent   Atrial fibrillation (HCC)   History of CVA (cerebrovascular accident)   Chronic diastolic CHF (congestive heart failure) ([place])  Discharge Instructions:  Activity:  As tolerated with Full fall precautions use walker/cane  & assistance as needed  Discharge Instructions     Call MD for:  difficulty breathing, headache or visual disturbances   Complete by: As directed    Diet - low sodium heart healthy   Complete by: As directed    Discharge instructions   Complete by: As directed    Follow with Primary MD  Janifer Adie, MD in 1-2 weeks  Please get a complete blood count and chemistry panel checked by your Primary MD at your next visit, and again as instructed by your Primary MD.  Get Medicines reviewed and adjusted: Please take all your medications with you for your next visit with your Primary MD  Laboratory/radiological data: Please request your Primary MD to go over all hospital tests and procedure/radiological results at the follow up, please ask your Primary MD to get all Hospital records sent to his/her office.  In some cases, they will be blood work, cultures and biopsy results pending at the time of your discharge. Please request that your primary care M.D. follows up on these results.  Also Note the following: If you experience worsening of your admission symptoms, develop shortness of breath, life threatening emergency, suicidal or homicidal thoughts you must seek medical attention immediately by calling 911 or calling your MD immediately  if symptoms less severe.  You must read complete instructions/literature along with all the possible adverse reactions/side effects for all the Medicines you take and that have been prescribed to you. Take any new Medicines after you have completely understood and accpet all the possible adverse reactions/side effects.   Do not drive when taking Pain medications or sleeping medications (Benzodaizepines)  Do not take more than prescribed Pain, Sleep and Anxiety Medications. It is not advisable to combine anxiety,sleep and pain medications without talking with your primary care practitioner  Special Instructions: If you have smoked or chewed Tobacco  in the  last 2 yrs please stop smoking, stop any regular Alcohol  and or any Recreational drug use.  Wear Seat belts while driving.  Please note: You were cared for by a hospitalist during your hospital stay. Once you are discharged, your primary care physician will handle any further medical issues. Please note that NO REFILLS for any discharge medications will be authorized once you are discharged, as it is imperative that you return to your primary care physician (or establish a relationship with a primary care physician if you do not have one) for your post hospital discharge needs so that they can reassess your need for medications and monitor your lab values.   Increase activity slowly   Complete by: As directed       Allergies as of 04/02/2022       Reactions   Sulfamethoxazole Nausea And Vomiting   Bladder infection NOT ON MAR   Aciphex [rabeprazole Sodium]    Unknown reaction   Biaxin [clarithromycin] Hives, Itching   Hydrocodone    NOT ON MAR   Ketek [telithromycin]    NOT ON MAR   Latex    Unknown reaction   Levaquin [levofloxacin In D5w]    Unknown reaction   Lipitor [atorvastatin]    Unknown reaction   Macrobid [nitrofurantoin Macrocrystal]    NOT ON MAR   Moxifloxacin    NOT ON MAR   Nexium [esomeprazole Magnesium]    Sulfonamide Derivatives    NOT ON MAR   Vesicare [solifenacin]  NOT ON MAR   Zithromax [azithromycin]    NOT ON MAR   Cephalexin Itching, Rash        Medication List     STOP taking these medications    diltiazem 180 MG 24 hr capsule Commonly known as: TIAZAC   GAS-X PO   Lagevrio 200 MG Caps capsule Generic drug: molnupiravir EUA   lidocaine-prilocaine cream Commonly known as: EMLA   loperamide 2 MG capsule Commonly known as: IMODIUM   senna 8.6 MG Tabs tablet Commonly known as: SENOKOT       TAKE these medications    acetaminophen 650 MG CR tablet Commonly known as: TYLENOL Take 650 mg by mouth every 8 (eight) hours. May  also take 1 tablet by mouth every 4 hours as needed for pain   amiodarone 200 MG tablet Commonly known as: PACERONE Take 1 tablet (200 mg total) by mouth daily.   amoxicillin-clavulanate 875-125 MG tablet Commonly known as: AUGMENTIN Take 1 tablet by mouth 2 (two) times daily for 3 days.   BIOFREEZE PROFESSIONAL EX Apply 1 application. topically every 4 (four) hours as needed (joint pain).   bisacodyl 10 MG suppository Commonly known as: DULCOLAX Place 1 suppository (10 mg total) rectally daily as needed for moderate constipation.   brimonidine 0.1 % Soln Commonly known as: ALPHAGAN P Place 1 drop into the left eye in the morning, at noon, and at bedtime.   budesonide-formoterol 160-4.5 MCG/ACT inhaler Commonly known as: SYMBICORT Inhale 2 puffs into the lungs 2 (two) times daily.   CRANBERRY PO Take 425 mg by mouth daily as needed (bladder irritation).   cyanocobalamin 1000 MCG tablet Take 1,000 mcg by mouth daily.   Ensure Take 237 mLs by mouth 2 (two) times daily between meals.   ferrous sulfate 325 (65 FE) MG tablet Take 325 mg by mouth daily with breakfast.   furosemide 40 MG tablet Commonly known as: LASIX Take 1 tablet (40 mg total) by mouth daily. Start taking on: April 03, 2022   GenTeal Tears Severe Day/Night 0.4-0.3 % Gel ophthalmic gel Generic drug: Polyethyl Glycol-Propyl Glycol Place 1 Application into both eyes at bedtime.   guaiFENesin 600 MG 12 hr tablet Commonly known as: MUCINEX Take 2 tablets (1,200 mg total) by mouth 2 (two) times daily.   hydrocortisone cream 1 % Apply 1 Application topically 2 (two) times daily. Apply 1 application to both hands twice a day until healed   latanoprost 0.005 % ophthalmic solution Commonly known as: XALATAN PLACE 1 DROP IN [facility] EYE AT BEDTIME What changed: See the new instructions.   levalbuterol 0.63 MG/3ML nebulizer solution Commonly known as: XOPENEX Take 3 mLs (0.63 mg total) by nebulization every  6 (six) hours as needed for wheezing or shortness of breath. What changed: when to take this   levalbuterol 45 MCG/ACT inhaler Commonly known as: Xopenex HFA Inhale 2 puffs into the lungs 3 (three) times daily.   levothyroxine 75 MCG tablet Commonly known as: SYNTHROID Take 75 mcg by mouth daily before breakfast. Take once daily   loratadine 10 MG tablet Commonly known as: CLARITIN Take 10 mg by mouth daily.   LORazepam 0.5 MG tablet Commonly known as: ATIVAN Take 1 tablet (0.5 mg total) by mouth at bedtime. May also give 1 tablet by mouth twice a day as needed for anxiety   menthol-cetylpyridinium 3 MG lozenge Commonly known as: CEPACOL Take 1 lozenge (3 mg total) by mouth as needed for sore throat.   OCUSOFT  EYE [place] [facility] Apply 1 application. to eye daily. Applied topically to eyelids once daily with warm water   Olopatadine HCl 0.2 % Soln Place 1 drop into both eyes daily.   ondansetron 4 MG tablet Commonly known as: Zofran Take 1 tablet (4 mg total) by mouth daily as needed for nausea or vomiting.   pantoprazole 40 MG tablet Commonly known as: Protonix Take 1 tablet (40 mg total) by mouth 2 (two) times daily. What changed: when to take this   polyethylene glycol 17 g packet Commonly known as: MIRALAX / GLYCOLAX Take 17 g by mouth daily.   polyvinyl alcohol 1.4 % ophthalmic solution Commonly known as: LIQUIFILM TEARS Place 1 drop into both eyes 4 (four) times daily as needed for dry eyes.   predniSONE 5 MG tablet Commonly known as: DELTASONE Take 40 mg p.o. daily for 2 days, then 30 mg p.o. daily for 2 days, then 20 mg p.o. daily for 2 days, then 10 mg p.o. daily for 2 days, and then resume usual dosing of 5 mg daily. What changed:  how much to take how to take this when to take this additional instructions   Probiotic 250 MG Caps Take 250 mg by mouth daily. For IBS   rivaroxaban 20 MG Tabs tablet Commonly known as: XARELTO Take 20 mg by mouth at  bedtime.   senna-docusate 8.6-50 MG tablet Commonly known as: Senokot-S Take 2 tablets by mouth at bedtime.   sodium fluoride 1.1 % Crea dental cream Commonly known as: PREVIDENT 5000 PLUS Place 1 Application onto teeth at bedtime.   traMADol 50 MG tablet Commonly known as: ULTRAM Take 0.5 tablets (25 mg total) by mouth 2 (two) times daily as needed for moderate pain (back pain).   valsartan 40 MG tablet Commonly known as: DIOVAN Take 20 mg by mouth daily.   Vitamin D 50 MCG (2000 UT) Caps Take 2,000 Units by mouth daily.        Follow-up Information     Janifer Adie, MD Follow up in 1 week(s).   Specialty: Family Medicine Contact information: [place] [address] [phone]         Minus Breeding, MD Follow up in 1 month(s).   Specialty: Cardiology Contact information: [address] [place] [address] [phone]                Allergies  Allergen Reactions   Sulfamethoxazole Nausea And Vomiting    Bladder infection NOT ON MAR   Aciphex [Rabeprazole Sodium]     Unknown reaction   Biaxin [Clarithromycin] Hives and Itching   Hydrocodone     NOT ON MAR    Ketek [Telithromycin]     NOT ON MAR   Latex     Unknown reaction    Levaquin [Levofloxacin In D5w]     Unknown reaction    Lipitor [Atorvastatin]     Unknown reaction    Macrobid [Nitrofurantoin Macrocrystal]     NOT ON MAR   Moxifloxacin     NOT ON MAR   Nexium [Esomeprazole Magnesium]    Sulfonamide Derivatives     NOT ON MAR    Vesicare [Solifenacin]     NOT ON MAR   Zithromax [Azithromycin]     NOT ON MAR    Cephalexin Itching and Rash     Other Procedures/Studies: DG Chest Port 1V same Day  Result Date: 03/29/2022 CLINICAL DATA:  Shortness of breath. EXAM: PORTABLE  CHEST 1 VIEW COMPARISON:  03/28/2022 FINDINGS: Patchy bilateral airspace disease again noted, progressive in the right upper lobe. Small bilateral pleural effusions  persist. Cardiopericardial silhouette is at upper limits of normal for size. Bones are diffusely demineralized. Telemetry leads overlie the chest. IMPRESSION: 1. Patchy bilateral airspace disease, progressive in the right upper lobe. Findings could reflect asymmetric edema or diffuse infection. 2. Small bilateral pleural effusions. Electronically Signed   By: Misty Stanley M.D.   On: 03/29/2022 08:30   DG Chest Port 1V same Day  Result Date: 03/28/2022 CLINICAL DATA:  Shortness of breath, cough EXAM: PORTABLE CHEST 1 VIEW COMPARISON:  Previous studies including the examination of 03/23/2022 FINDINGS: Transverse diameter of heart is increased. Increased interstitial and alveolar densities are seen in parahilar regions with interval worsening in the left parahilar region. Small bilateral pleural effusions are seen with possible increased. There is no pneumothorax. IMPRESSION: Increased interstitial and alveolar markings are seen in both lungs, more so in the left parahilar region. Findings suggest CHF with pulmonary edema. Possibility of multifocal pneumonia is not excluded. Small bilateral pleural effusions with interval increase. Electronically Signed   By: Elmer Picker M.D.   On: 03/28/2022 16:06   DG Chest Portable 1 View  Result Date: 03/23/2022 CLINICAL DATA:  Shortness of breath and fevers, history of COVID-19 positivity EXAM: PORTABLE CHEST 1 VIEW COMPARISON:  10/21/2021 FINDINGS: Cardiac shadow is enlarged but stable. Aortic calcifications are seen. Increased central vascular congestion is noted consistent with underlying CHF. Patchy areas of airspace opacity are noted particularly in the right lung which may represent edema although focal infiltrate could not be totally excluded. Bony structures are within normal limits. IMPRESSION: Changes of mild CHF. Airspace opacities in the right lung which may be related to edema or infectious etiology. Electronically Signed   By: Inez Catalina M.D.   On:  03/23/2022 22:47     TODAY-DAY OF DISCHARGE:  Subjective:   Stacey Davenport today has no headache,no chest abdominal pain,no new weakness tingling or numbness, feels much better wants to go home today.  Objective:   Blood pressure (!) 161/61, pulse (!) 53, temperature 97.6 F (36.4 C), temperature source Oral, resp. rate (!) 21, SpO2 92 %.  Intake/Output Summary (Last 24 hours) at 04/02/2022 0947 Last data filed at 04/02/2022 0332 Gross per 24 hour  Intake 400 ml  Output 300 ml  Net 100 ml   There were no vitals filed for this visit.  Exam: Awake Alert, Oriented *3, No new F.N deficits, Normal affect [place].AT,PERRAL Supple Neck,No JVD, No cervical lymphadenopathy appriciated.  Symmetrical Chest wall movement, Good air movement bilaterally, CTAB RRR,No Gallops,Rubs or new Murmurs, No Parasternal Heave +ve B.Sounds, Abd Soft, Non tender, No organomegaly appriciated, No rebound -guarding or rigidity. No Cyanosis, Clubbing or edema, No new Rash or bruise   PERTINENT RADIOLOGIC STUDIES: No results found.   PERTINENT LAB RESULTS: CBC: Recent Labs    03/31/22 0943  WBC 13.7*  HGB 13.0  HCT 41.1  PLT 223   CMET CMP     Component Value Date/Time   NA 141 04/02/2022 0310   NA 135 06/11/2021 1253   K 3.8 04/02/2022 0310   CL 104 04/02/2022 0310   CO2 28 04/02/2022 0310   GLUCOSE 91 04/02/2022 0310   BUN 22 04/02/2022 0310   BUN 10 06/11/2021 1253   CREATININE 0.59 04/02/2022 0310   CREATININE 0.83 12/06/2012 1040   CALCIUM 7.9 (L) 04/02/2022 0310   PROT 4.9 (  L) 03/29/2022 0334   PROT 6.3 06/11/2021 1253   ALBUMIN 2.2 (L) 03/29/2022 0334   ALBUMIN 4.1 06/11/2021 1253   AST 19 03/29/2022 0334   ALT 32 03/29/2022 0334   ALKPHOS 48 03/29/2022 0334   BILITOT 0.5 03/29/2022 0334   BILITOT 0.4 06/11/2021 1253   GFRNONAA >60 04/02/2022 0310   GFRNONAA 70 12/06/2012 1040   GFRAA 91 10/16/2013 1142   GFRAA 81 12/06/2012 1040    GFR CrCl cannot be calculated (Unknown  ideal weight.). No results for input(s): "LIPASE", "AMYLASE" in the last 72 hours. No results for input(s): "CKTOTAL", "CKMB", "CKMBINDEX", "TROPONINI" in the last 72 hours. Invalid input(s): "POCBNP" No results for input(s): "DDIMER" in the last 72 hours. No results for input(s): "HGBA1C" in the last 72 hours. No results for input(s): "CHOL", "HDL", "LDLCALC", "TRIG", "CHOLHDL", "LDLDIRECT" in the last 72 hours. No results for input(s): "TSH", "T4TOTAL", "T3FREE", "THYROIDAB" in the last 72 hours.  Invalid input(s): "FREET3" No results for input(s): "VITAMINB12", "FOLATE", "FERRITIN", "TIBC", "IRON", "RETICCTPCT" in the last 72 hours. Coags: No results for input(s): "INR" in the last 72 hours.  Invalid input(s): "PT" Microbiology: Recent Results (from the past 240 hour(s))  SARS Coronavirus 2 by RT PCR (hospital order, performed in [facility] hospital lab) *cepheid single result test* Anterior Nasal Swab     Status: Abnormal   Collection Time: 03/23/22  9:56 PM   Specimen: Anterior Nasal Swab  Result Value Ref Range Status   SARS Coronavirus 2 by RT PCR POSITIVE (A) NEGATIVE Final    Comment: (NOTE) SARS-CoV-2 target nucleic acids are DETECTED  SARS-CoV-2 RNA is generally detectable in upper respiratory specimens  during the acute phase of infection.  Positive results are indicative  of the presence of the identified virus, but do not rule out bacterial infection or co-infection with other pathogens not detected by the test.  Clinical correlation with patient history and  other diagnostic information is necessary to determine patient infection status.  The expected result is negative.  Fact Sheet for Patients:   https://www.patel.info/   Fact Sheet for Healthcare Providers:   https://hall.com/    This test is not yet approved or cleared by the [place] FDA and  has been authorized for detection and/or diagnosis of SARS-CoV-2  by FDA under an Emergency Use Authorization (EUA).  This EUA will remain in effect (meaning this test can be used) for the duration of  the COVID-19 declaration under Section 564(b)(1)  of the Act, 21 U.S.C. section 360-bbb-3(b)(1), unless the authorization is terminated or revoked sooner.   Performed at [facility], [place] [address] [place], [place] [address]   Blood culture (routine x 2)     Status: None   Collection Time: 03/23/22 10:03 PM   Specimen: BLOOD  Result Value Ref Range Status   Specimen Description BLOOD LEFT ANTECUBITAL  Final   Special Requests   Final    BOTTLES DRAWN AEROBIC AND ANAEROBIC Blood Culture adequate volume   Culture   Final    NO GROWTH 5 DAYS Performed at [facility], [place] [address], [place], [place] [address]    Report Status 03/28/2022 FINAL  Final  Blood culture (routine x 2)     Status: None   Collection Time: 03/23/22 10:09 PM   Specimen: BLOOD  Result Value Ref Range Status   Specimen Description BLOOD RIGHT ANTECUBITAL  Final   Special Requests   Final    BOTTLES DRAWN AEROBIC  AND ANAEROBIC Blood Culture results may not be optimal due to an inadequate volume of blood received in culture bottles   Culture   Final    NO GROWTH 5 DAYS Performed at [facility] [address], [place], [place] [address]    Report Status 03/28/2022 FINAL  Final    FURTHER DISCHARGE INSTRUCTIONS:  Get Medicines reviewed and adjusted: Please take all your medications with you for your next visit with your Primary MD  Laboratory/radiological data: Please request your Primary MD to go over all hospital tests and procedure/radiological results at the follow up, please ask your Primary MD to get all Hospital records sent to his/her office.  In some cases, they will be blood work, cultures and biopsy results pending at the time of your discharge. Please request that your primary care M.D. goes through all the records of your hospital data and  follows up on these results.  Also Note the following: If you experience worsening of your admission symptoms, develop shortness of breath, life threatening emergency, suicidal or homicidal thoughts you must seek medical attention immediately by calling 911 or calling your MD immediately  if symptoms less severe.  You must read complete instructions/literature along with all the possible adverse reactions/side effects for all the Medicines you take and that have been prescribed to you. Take any new Medicines after you have completely understood and accpet all the possible adverse reactions/side effects.   Do not drive when taking Pain medications or sleeping medications (Benzodaizepines)  Do not take more than prescribed Pain, Sleep and Anxiety Medications. It is not advisable to combine anxiety,sleep and pain medications without talking with your primary care practitioner  Special Instructions: If you have smoked or chewed Tobacco  in the last 2 yrs please stop smoking, stop any regular Alcohol  and or any Recreational drug use.  Wear Seat belts while driving.  Please note: You were cared for by a hospitalist during your hospital stay. Once you are discharged, your primary care physician will handle any further medical issues. Please note that NO REFILLS for any discharge medications will be authorized once you are discharged, as it is imperative that you return to your primary care physician (or establish a relationship with a primary care physician if you do not have one) for your post hospital discharge needs so that they can reassess your need for medications and monitor your lab values.  Total Time spent coordinating discharge including counseling, education and face to face time equals greater than 30 minutes.  SignedOren Binet 04/02/2022 9:47 AM

## 2022-04-02 NOTE — NC FL2 (Addendum)
MEDICAID FL2 LEVEL OF CARE SCREENING TOOL     IDENTIFICATION  Patient Name: Stacey Davenport Birthdate: February 10, 1941 Sex: female Admission Date (Current Location): 03/23/2022  [facility] and [place] Number:  Herbalist and Address:  [facility], [place] [address] [place],  [address]      Provider Number: [id number]  Attending Physician Name and Address:  Jonetta Osgood, MD  Relative Name and Phone Number:       Current Level of Care: Hospital Recommended Level of Care: [place] Prior Approval Number:    Date Approved/Denied:   PASRR Number: [id number]  Discharge Plan: SNF    Current Diagnoses: Patient Active Problem List   Diagnosis Date Noted   Pneumonia due to COVID-19 virus 03/24/2022   Chronic diastolic CHF (congestive heart failure) ([place]) 03/24/2022   Leg swelling 11/30/2021   Mild protein-calorie malnutrition ([place]) 10/22/2021   Depression 10/22/2021   Elevated brain natriuretic peptide (BNP) level 10/22/2021   GI bleed 10/22/2021   Anticoagulated    Sagittal band rupture, extensor tendon, nontraumatic, left 12/30/2020   Irregular heart beat 08/15/2017   Primary open angle glaucoma of both eyes, mild stage 04/11/2017   High risk medication use 09/03/2013   Multiple drug allergies 09/03/2013   Osteoporosis, postmenopausal 05/23/2013   Hypothyroidism 04/23/2013   Hyperlipidemia 12/06/2012   History of CVA (cerebrovascular accident) 12/06/2012   Atrial fibrillation ([place]) 09/30/2011   Hypertension 08/20/2009   Glaucoma 04/17/2009   Asthma, chronic, steroid-dependent 04/17/2009   ESOPHAGEAL STRICTURE 04/17/2009   GERD 04/17/2009   Rheumatoid arthritis(714.0) 04/17/2009    Orientation RESPIRATION BLADDER Height & Weight     Self, Place, Situation, Time  O2 (2L nasal cannula) Incontinent Weight:   Height:     BEHAVIORAL SYMPTOMS/MOOD NEUROLOGICAL BOWEL NUTRITION STATUS      Continent Diet  (See DC Summary)  AMBULATORY STATUS COMMUNICATION OF NEEDS Skin   Extensive Assist Verbally Normal                       Personal Care Assistance Level of Assistance  Bathing, Feeding, Dressing Bathing Assistance: Limited assistance Feeding assistance: Limited assistance Dressing Assistance: Limited assistance     Functional Limitations Info  Sight, Hearing Sight Info: Impaired Hearing Info: Impaired      SPECIAL CARE FACTORS FREQUENCY  PT (By licensed PT), OT (By licensed OT)     PT Frequency: 5x/week OT Frequency: 5x/week            Contractures Contractures Info: Not present    Additional Factors Info  Code Status, Allergies, Isolation Precautions Code Status Info: DNR Allergies Info: Sulfamethoxazole, Aciphex (Rabeprazole Sodium), Biaxin (Clarithromycin), Hydrocodone, Ketek (Telithromycin), Latex, Levaquin (Levofloxacin In D5w), Lipitor (Atorvastatin), Macrobid (Nitrofurantoin Macrocrystal), Moxifloxacin, Nexium (Esomeprazole Magnesium), Sulfonamide Derivatives, Vesicare (Solifenacin), Zithromax (Azithromycin), Cephalexin     Isolation Precautions Info: COVID+     Current Medications (04/02/2022):  This is the current hospital active medication list Current Facility-Administered Medications  Medication Dose Route Frequency Provider Last Rate Last Admin   acetaminophen (TYLENOL) tablet 650 mg  650 mg Oral Q6H PRN Opyd, Ilene Qua, MD   650 mg at 04/01/22 2119   amiodarone (PACERONE) tablet 200 mg  200 mg Oral Daily Opyd, Ilene Qua, MD   200 mg at 04/02/22 0824   ampicillin-sulbactam (UNASYN) 1.5 g in sodium chloride 0.9 % 100 mL IVPB  1.5 g Intravenous Q6H Ghimire, Henreitta Leber, MD 200 mL/hr at  04/02/22 0828 1.5 g at 04/02/22 0828   arformoterol (BROVANA) nebulizer solution 15 mcg  15 mcg Nebulization BID Jonetta Osgood, MD   15 mcg at 04/02/22 2595   bisacodyl (DULCOLAX) suppository 10 mg  10 mg Rectal Once Rosezella Rumpf, NP       brimonidine (ALPHAGAN)  0.15 % ophthalmic solution 1 drop  1 drop Left Eye TID Jonetta Osgood, MD   1 drop at 04/02/22 0825   budesonide (PULMICORT) nebulizer solution 0.25 mg  0.25 mg Nebulization BID Jonetta Osgood, MD   0.25 mg at 04/02/22 6387   diphenhydrAMINE (BENADRYL) capsule 25 mg  25 mg Oral Once PRN Jonetta Osgood, MD       furosemide (LASIX) tablet 40 mg  40 mg Oral Daily Jonetta Osgood, MD   40 mg at 04/02/22 0823   guaiFENesin (MUCINEX) 12 hr tablet 1,200 mg  1,200 mg Oral BID Jonetta Osgood, MD   1,200 mg at 04/02/22 0824   insulin aspart (novoLOG) injection 0-5 Units  0-5 Units Subcutaneous QHS Jonetta Osgood, MD   2 Units at 04/01/22 2132   insulin aspart (novoLOG) injection 0-9 Units  0-9 Units Subcutaneous TID WC Jonetta Osgood, MD   7 Units at 04/01/22 1659   irbesartan (AVAPRO) tablet 37.5 mg  37.5 mg Oral Daily Opyd, Ilene Qua, MD   37.5 mg at 04/02/22 0824   latanoprost (XALATAN) 0.005 % ophthalmic solution 1 drop  1 drop Both Eyes QHS Jonetta Osgood, MD   1 drop at 04/01/22 2119   levalbuterol (XOPENEX) nebulizer solution 0.63 mg  0.63 mg Inhalation Q6H PRN Jonetta Osgood, MD       levothyroxine (SYNTHROID) tablet 75 mcg  75 mcg Oral QAC breakfast Opyd, Ilene Qua, MD   75 mcg at 04/02/22 0741   LORazepam (ATIVAN) tablet 0.5 mg  0.5 mg Oral QHS PRN Opyd, Ilene Qua, MD   0.5 mg at 04/01/22 2118   menthol-cetylpyridinium (CEPACOL) lozenge 3 mg  1 lozenge Oral PRN Jonetta Osgood, MD       ondansetron [facility]) injection 4 mg  4 mg Intravenous Q6H PRN Rosezella Rumpf, NP   4 mg at 03/30/22 2350   pantoprazole (PROTONIX) EC tablet 40 mg  40 mg Oral BID Jonetta Osgood, MD   40 mg at 04/02/22 0823   polyethylene glycol (MIRALAX / GLYCOLAX) packet 17 g  17 g Oral Daily Jonetta Osgood, MD   17 g at 04/02/22 5643   polyvinyl alcohol (LIQUIFILM TEARS) 1.4 % ophthalmic solution 1 drop  1 drop Both Eyes QID PRN Jonetta Osgood, MD       predniSONE (DELTASONE)  tablet 50 mg  50 mg Oral Q breakfast Jonetta Osgood, MD   50 mg at 04/02/22 3295   revefenacin (YUPELRI) nebulizer solution 175 mcg  175 mcg Nebulization Daily Jonetta Osgood, MD   175 mcg at 04/02/22 1884   rivaroxaban (XARELTO) tablet 20 mg  20 mg Oral Q supper Opyd, Ilene Qua, MD   20 mg at 04/01/22 1659   senna-docusate (Senokot-S) tablet 2 tablet  2 tablet Oral QHS Jonetta Osgood, MD   2 tablet at 04/01/22 2118   sodium chloride flush (NS) 0.9 % injection 3 mL  3 mL Intravenous Q12H Opyd, Ilene Qua, MD   3 mL at 04/02/22 0825   traMADol (ULTRAM) tablet 25 mg  25 mg Oral BID PRN Oren Binet  M, MD   25 mg at 03/27/22 1425     Discharge Medications: Please see discharge summary for a list of discharge medications.  Relevant Imaging Results:  Relevant Lab Results:   Additional Information SSN: [id number]  Benard Halsted, LCSW

## 2022-04-02 NOTE — Progress Notes (Signed)
   Palliative Medicine Inpatient Follow Up Note   HPI: Patient is a 81 y.o.  female with history of  asthma-on chronic steroids, HTN, HLD, CVA, dementia, PAF on Xarelto-who tested positive for COVID-19 at SNF on 8/23-presented to the hospital with worsening shortness of breath/fatigue-found to have acute hypoxic respiratory failure due to COVID-19 pneumonia.   Palliative care was consulted for goals of care in the setting of severe COVID PNA.   Today's Discussion 04/02/2022  *Please note that this is a verbal dictation therefore any spelling or grammatical errors are due to the "[place]" system interpretation.  Chart reviewed inclusive of vital signs, progress notes, laboratory results, and diagnostic images.   I met at bedside with Stacey Davenport this morning.She expresses that she has not had a good bowel movement to date. We reviewed options in terms of medications to support constipation. She is in agreement with getting a suppository at this time. She does still have some intermittent nausea though this is something which she has not required frequent use of an antiemetic and will pass on it's own.   Discussed plan for transition to rehabilitation - [facility] this afternoon.   Questions and concerns addressed/Palliative Support Provided.   Objective Assessment: Vital Signs Vitals:   04/02/22 0755 04/02/22 0809  BP: (!) 161/61   Pulse: (!) 53   Resp: (!) 21   Temp: 97.6 F (36.4 C)   SpO2: 92% 92%    Intake/Output Summary (Last 24 hours) at 04/02/2022 9390 Last data filed at 04/02/2022 3009 Gross per 24 hour  Intake 400 ml  Output 300 ml  Net 100 ml    Gen:  Frail elderly caucasian F in NAD HEENT: dry mucus membranes CV: Regular rate and rhythm, no murmurs rubs or gallops PULM: On 2LPM [place], breathing is even and nonlabored ABD: soft/nontender/nondistended/normal bowel sounds EXT: No edema Neuro: Alert and oriented x3  SUMMARY OF RECOMMENDATIONS   - DNAR/DNI --> in the  event of deterioration, would pursue comfort/ hospice route - Continue inpatient medical treatment for pneumonia --> Watchful waiting for improvements - Will need short term SNF placement for strengthening --> Will transition to [facility] today - Continue zofran for nausea - Bisacodyl suppository x1  Billing based on MDM: High ______________________________________________________________________________________ [place] Team Team Cell Phone: [phone] Please utilize secure chat with additional questions, if there is no response within 30 minutes please call the above phone number  Palliative Medicine Team providers are available by phone from 7am to 7pm daily and can be reached through the team cell phone.  Should this patient require assistance outside of these hours, please call the patient's attending physician.

## 2022-04-02 NOTE — Plan of Care (Signed)
  Problem: Coping: Goal: Psychosocial and spiritual needs will be supported Outcome: Progressing   Problem: Respiratory: Goal: Will maintain a patent airway Outcome: Progressing Goal: Complications related to the disease process, condition or treatment will be avoided or minimized Outcome: Progressing   Problem: Education: Goal: Knowledge of General Education information will improve Description: Including pain rating scale, medication(s)/side effects and non-pharmacologic comfort measures Outcome: Progressing   Problem: Health Behavior/Discharge Planning: Goal: Ability to manage health-related needs will improve Outcome: Progressing   Problem: Clinical Measurements: Goal: Ability to maintain clinical measurements within normal limits will improve Outcome: Progressing Goal: Will remain free from infection Outcome: Progressing Goal: Diagnostic test results will improve Outcome: Progressing Goal: Respiratory complications will improve Outcome: Progressing Goal: Cardiovascular complication will be avoided Outcome: Progressing   Problem: Activity: Goal: Risk for activity intolerance will decrease Outcome: Progressing   Problem: Nutrition: Goal: Adequate nutrition will be maintained Outcome: Progressing   Problem: Coping: Goal: Level of anxiety will decrease Outcome: Progressing   Problem: Elimination: Goal: Will not experience complications related to bowel motility Outcome: Progressing Goal: Will not experience complications related to urinary retention Outcome: Progressing   Problem: Pain Managment: Goal: General experience of comfort will improve Outcome: Progressing   Problem: Safety: Goal: Ability to remain free from injury will improve Outcome: Progressing   Problem: Skin Integrity: Goal: Risk for impaired skin integrity will decrease Outcome: Progressing   Problem: Education: Goal: Ability to describe self-care measures that may prevent or decrease  complications (Diabetes Survival Skills Education) will improve Outcome: Progressing Goal: Individualized Educational Video(s) Outcome: Progressing   Problem: Coping: Goal: Ability to adjust to condition or change in health will improve Outcome: Progressing   Problem: Fluid Volume: Goal: Ability to maintain a balanced intake and output will improve Outcome: Progressing   Problem: Health Behavior/Discharge Planning: Goal: Ability to identify and utilize available resources and services will improve Outcome: Progressing Goal: Ability to manage health-related needs will improve Outcome: Progressing   Problem: Metabolic: Goal: Ability to maintain appropriate glucose levels will improve Outcome: Progressing   Problem: Nutritional: Goal: Maintenance of adequate nutrition will improve Outcome: Progressing Goal: Progress toward achieving an optimal weight will improve Outcome: Progressing   Problem: Tissue Perfusion: Goal: Adequacy of tissue perfusion will improve Outcome: Progressing

## 2022-04-03 ENCOUNTER — Inpatient Hospital Stay (HOSPITAL_COMMUNITY): Payer: Medicare (Managed Care)

## 2022-04-03 DIAGNOSIS — J1282 Pneumonia due to coronavirus disease 2019: Secondary | ICD-10-CM | POA: Diagnosis not present

## 2022-04-03 DIAGNOSIS — U071 COVID-19: Secondary | ICD-10-CM | POA: Diagnosis not present

## 2022-04-03 LAB — GLUCOSE, CAPILLARY
Glucose-Capillary: 86 mg/dL (ref 70–99)
Glucose-Capillary: 124 mg/dL — ABNORMAL HIGH (ref 70–99)
Glucose-Capillary: 224 mg/dL — ABNORMAL HIGH (ref 70–99)
Glucose-Capillary: 159 mg/dL — ABNORMAL HIGH (ref 70–99)

## 2022-04-03 MED ORDER — BISACODYL 10 MG RE SUPP
10.0000 mg | Freq: Once | RECTAL | Status: DC
  Filled 2022-04-03: qty 1

## 2022-04-03 MED ORDER — MAGNESIUM HYDROXIDE 400 MG/5ML PO SUSP: 30.0000 mL | Freq: Every day | ORAL | Status: DC | PRN

## 2022-04-03 MED ORDER — PREDNISONE 5 MG PO TABS
30.0000 mg | ORAL_TABLET | Freq: Every day | ORAL | Status: DC
  Administered 2022-04-04 – 2022-04-05 (×2): 30 mg via ORAL
  Filled 2022-04-03 (×2): qty 2

## 2022-04-03 MED ORDER — MAGNESIUM HYDROXIDE 400 MG/5ML PO SUSP
30.0000 mL | Freq: Two times a day (BID) | ORAL | Status: AC
  Administered 2022-04-03 (×2): 30 mL via ORAL
  Filled 2022-04-03 (×2): qty 30

## 2022-04-03 NOTE — Progress Notes (Signed)
PROGRESS NOTE                                                                                                                                                                                                             Patient Demographics:    Stacey Davenport, is a 81 y.o. female, DOB - 07-May-1941, YYT:[id number]  Outpatient Primary MD for the patient is Stacey Adie, MD    LOS - 10  Admit date - 03/23/2022    Chief Complaint  Patient presents with   Shortness of Breath       Brief Narrative (HPI from H&P)   81 y.o.  female with history of  asthma-on chronic steroids, HTN, HLD, CVA, dementia, PAF on Xarelto-who tested positive for COVID-19 at SNF on 8/23-presented to the hospital with worsening shortness of breath/fatigue-found to have acute hypoxic respiratory failure due to COVID-19 pneumonia and subsequently admitted to the hospitalist service.  Spittle course complicated by worsening hypoxia due to aspiration pneumonia.  See below for further details.   Significant events: 8/23>> COVID-19 positive at SNF 8/29>> admit-hypoxemia-COVID-19 pneumonia. 9/03>> worsening hypoxemia-suspicion for aspiration. 9/04>> on NRB-started Unasyn-restarted IV steroids 9/05>> improved-on 4 L of HFNC.   Significant studies: 8/29>> CXR: Airspace opacities in the right lower lung. 9/04>> CXR: Patchy bilateral airspace disease-progressive in the right upper lobe.   Significant microbiology data: 8/29>> COVID-19 PCR: Positive 8/29>> blood cultures: No growth   Subjective:    Stacey Davenport today has, No headache, No chest pain, No abdominal pain - No Nausea, No new weakness tingling or numbness, no SOB, mild nausea   Assessment  & Plan :    Acute hypoxic respiratory failure due to COVID-19 pneumonia-and superimposed aspiration pneumonia: Initially treated for COVID-pneumonia with steroids/Remdesivir-she improved-however hospital course  was complicated by worsening hypoxemia due to aspiration pneumonia.  She was started on Unasyn-and other supportive care.  Thankfully she started improving again-she is down to 1 L of oxygen appears stable.  We will continue on oral antimicrobial therapy for a few more days, and continue on tapering prednisone until she is back on her usual dose of prednisone.  She is stable for discharge family appealing discharge process, await decision.   Thrombocytopenia: Mild-due to COVID-resolved.    PAF: Maintaining sinus rhythm-continue amiodarone-continue Xarelto.   Chronic diastolic heart failure: Remains euvolemic-was maintained  on furosemide for negative balance.   Steroid-dependent asthma: Not in exacerbation-continue bronchodilators-on steroids chronically.   Dementia: Seems to be mildly/pleasantly confused-no family at bedside-but suspect not far from baseline.  Maintain delirium precautions.   GERD: Continue PPI   Constipation mild nausea: Continue MiraLAX/senna.  Abdominal exam and KUB stable.  Supportive care and monitor.        Condition - Fair  Family Communication  :  None present  Code Status :  Full  Consults  :  None  PUD Prophylaxis : PPI   Procedures  :            Disposition Plan  :    Status is: Inpatient  DVT Prophylaxis  :     rivaroxaban (XARELTO) tablet 20 mg     Lab Results  Component Value Date   PLT 223 03/31/2022    Diet :  Diet Order             Diet Heart Room service appropriate? Yes with Assist; Fluid consistency: Thin  Diet effective now           Diet - low sodium heart healthy                    Inpatient Medications  Scheduled Meds:  amiodarone  200 mg Oral Daily   arformoterol  15 mcg Nebulization BID   bisacodyl  10 mg Rectal Once   brimonidine  1 drop Left Eye TID   budesonide (PULMICORT) nebulizer solution  0.25 mg Nebulization BID   furosemide  40 mg Oral Daily   guaiFENesin  1,200 mg Oral BID   insulin aspart   0-5 Units Subcutaneous QHS   insulin aspart  0-9 Units Subcutaneous TID WC   irbesartan  37.5 mg Oral Daily   latanoprost  1 drop Both Eyes QHS   levothyroxine  75 mcg Oral QAC breakfast   pantoprazole  40 mg Oral BID   polyethylene glycol  17 g Oral Daily   predniSONE  50 mg Oral Q breakfast   revefenacin  175 mcg Nebulization Daily   rivaroxaban  20 mg Oral Q supper   senna-docusate  2 tablet Oral QHS   sodium chloride flush  3 mL Intravenous Q12H   Continuous Infusions:  ampicillin-sulbactam (UNASYN) IV 1.5 g (04/03/22 0915)   PRN Meds:.acetaminophen, diphenhydrAMINE, levalbuterol, LORazepam, magnesium hydroxide, menthol-cetylpyridinium, ondansetron (ZOFRAN) IV, polyvinyl alcohol, traMADol  Time Spent in minutes  30   Stacey Davenport M.D on 04/03/2022 at 11:07 AM  To page go to [email or web address]   [facility] -  Office  [phone]  See all Orders from today for further details    Objective:   Vitals:   04/02/22 2314 04/03/22 0333 04/03/22 0800 04/03/22 0850  BP: 126/64 (!) 174/70 (!) 148/64   Pulse: (!) 58 (!) 57 62   Resp: '18 18 18   '$ Temp: 98 F (36.7 C) 97.9 F (36.6 C) 98 F (36.7 C)   TempSrc: Oral Oral Oral   SpO2: 98% 97% 94% 95%    Wt Readings from Last 3 Encounters:  02/02/22 62.1 kg  01/19/22 62.6 kg  10/22/21 63.5 kg     Intake/Output Summary (Last 24 hours) at 04/03/2022 1107 Last data filed at 04/03/2022 0333 Gross per 24 hour  Intake 240 ml  Output 700 ml  Net -460 ml     Physical Exam  Awake Alert, No new F.N deficits, Normal affect .AT,PERRAL Supple Neck, No JVD,  Symmetrical Chest wall movement, Good air movement bilaterally, CTAB RRR,No Gallops,Rubs or new Murmurs,  +ve B.Sounds, Abd Soft, No tenderness,   No Cyanosis, Clubbing or edema       Data Review:    CBC Recent Labs  Lab 03/28/22 0350 03/29/22 0334 03/31/22 0943  WBC 15.9* 17.5* 13.7*  HGB 13.1 13.6 13.0  HCT 40.7 40.6 41.1  PLT 192 199 223  MCV 91.1  90.4 93.4  MCH 29.3 30.3 29.5  MCHC 32.2 33.5 31.6  RDW 16.5* 16.5* 16.2*  LYMPHSABS 0.4* 0.5*  --   MONOABS 0.6 0.7  --   EOSABS 0.0 0.0  --   BASOSABS 0.0 0.0  --     Electrolytes Recent Labs  Lab 03/28/22 0345 03/28/22 0350 03/29/22 0334 03/30/22 0622 03/31/22 0943 04/02/22 0310  NA  --  140 143 146* 141 141  K  --  3.4* 4.0 3.8 4.0 3.8  CL  --  106 107 104 103 104  CO2  --  '25 28 31 29 28  '$ GLUCOSE  --  185* 130* 179* 161* 91  BUN  --  29* 26* 23 28* 22  CREATININE  --  0.69 0.67 0.68 0.62 0.59  CALCIUM  --  8.2* 8.3* 8.4* 8.2* 7.9*  AST  --  15 19  --   --   --   ALT  --  23 32  --   --   --   ALKPHOS  --  48 48  --   --   --   BILITOT  --  0.4 0.5  --   --   --   ALBUMIN  --  2.2* 2.2*  --   --   --   MG  --  2.2 2.2  --   --   --   CRP  --  11.1* 18.8*  --   --  4.0*  DDIMER  --  0.94*  --   --   --   --   PROCALCITON 0.33  --   --   --   --   --   HGBA1C  --  5.8*  --   --   --   --   BNP  --   --  126.7*  --   --   --     ------------------------------------------------------------------------------------------------------------------ No results for input(s): "CHOL", "HDL", "LDLCALC", "TRIG", "CHOLHDL", "LDLDIRECT" in the last 72 hours.  Lab Results  Component Value Date   HGBA1C 5.8 (H) 03/28/2022    No results for input(s): "TSH", "T4TOTAL", "T3FREE", "THYROIDAB" in the last 72 hours.  Invalid input(s): "FREET3" ------------------------------------------------------------------------------------------------------------------ ID Labs Recent Labs  Lab 03/28/22 0345 03/28/22 0350 03/29/22 0334 03/30/22 0622 03/31/22 0943 04/02/22 0310  WBC  --  15.9* 17.5*  --  13.7*  --   PLT  --  192 199  --  223  --   CRP  --  11.1* 18.8*  --   --  4.0*  DDIMER  --  0.94*  --   --   --   --   PROCALCITON 0.33  --   --   --   --   --   CREATININE  --  0.69 0.67 0.68 0.62 0.59   Cardiac Enzymes No results for input(s): "CKMB", "TROPONINI", "MYOGLOBIN" in  the last 168 hours.  Invalid input(s): "CK"  Micro Results No results found for this or any previous visit (from the past 240 hour(s)).  Radiology  Reports DG Abd Portable 1V  Result Date: 04/03/2022 CLINICAL DATA:  Nausea. EXAM: PORTABLE ABDOMEN - 1 VIEW COMPARISON:  None Available. FINDINGS: No gaseous small bowel dilatation to suggest obstruction. Air in stool are seen scattered along the course of a mildly distended colon. Bones are diffusely demineralized. Calcification over the right abdomen could be related to the right kidney. Thoracolumbar scoliosis evident. IMPRESSION: 1. No evidence for small bowel obstruction. CT imaging could be used to further evaluate as clinically warranted. 2. Probable right renal stone as noted on CT scan 05/18/2021. Electronically Signed   By: Misty Stanley M.D.   On: 04/03/2022 10:05

## 2022-04-03 NOTE — Care Management (Addendum)
Current Status of Appeal Per Judithann Graves Website Case ID [id number]     Overall Status Appeal Case: is in Clinical Review.  1. Discharge Appeal Started 04/02/2022 13:55:39  2. Medical Record Requested 04/02/2022 14:16:01  3. Documents Received 04/02/2022 15:16:09  4. In Clinical Review 04/02/2022 17:25:05    16:15- remains in clinical review at this time. No determination has been made. Will likely be determined tomorrow.

## 2022-04-04 DIAGNOSIS — J1282 Pneumonia due to coronavirus disease 2019: Secondary | ICD-10-CM | POA: Diagnosis not present

## 2022-04-04 DIAGNOSIS — U071 COVID-19: Secondary | ICD-10-CM | POA: Diagnosis not present

## 2022-04-04 DIAGNOSIS — Z515 Encounter for palliative care: Secondary | ICD-10-CM | POA: Diagnosis not present

## 2022-04-04 LAB — GLUCOSE, CAPILLARY
Glucose-Capillary: 89 mg/dL (ref 70–99)
Glucose-Capillary: 130 mg/dL — ABNORMAL HIGH (ref 70–99)
Glucose-Capillary: 186 mg/dL — ABNORMAL HIGH (ref 70–99)
Glucose-Capillary: 214 mg/dL — ABNORMAL HIGH (ref 70–99)

## 2022-04-04 MED ORDER — SENNOSIDES-DOCUSATE SODIUM 8.6-50 MG PO TABS
1.0000 | ORAL_TABLET | Freq: Every day | ORAL | Status: DC
  Administered 2022-04-04: 1 via ORAL
  Filled 2022-04-04: qty 1

## 2022-04-04 MED ORDER — DOCUSATE SODIUM 100 MG PO CAPS
200.0000 mg | ORAL_CAPSULE | Freq: Two times a day (BID) | ORAL | Status: DC
  Administered 2022-04-04 – 2022-04-05 (×3): 200 mg via ORAL
  Filled 2022-04-04 (×3): qty 2

## 2022-04-04 MED ORDER — POLYETHYLENE GLYCOL 3350 17 G PO PACK
17.0000 g | PACK | Freq: Two times a day (BID) | ORAL | Status: DC
  Administered 2022-04-04 – 2022-04-05 (×2): 17 g via ORAL
  Filled 2022-04-04 (×2): qty 1

## 2022-04-04 NOTE — Progress Notes (Signed)
PROGRESS NOTE                                                                                                                                                                                                             Patient Demographics:    Stacey Davenport, is a 81 y.o. female, DOB - 1941-05-04, FHQ:[id number]  Outpatient Primary MD for the patient is Janifer Adie, MD    LOS - 11  Admit date - 03/23/2022    Chief Complaint  Patient presents with   Shortness of Breath       Brief Narrative (HPI from H&P)   81 y.o.  female with history of  asthma-on chronic steroids, HTN, HLD, CVA, dementia, PAF on Xarelto-who tested positive for COVID-19 at SNF on 8/23-presented to the hospital with worsening shortness of breath/fatigue-found to have acute hypoxic respiratory failure due to COVID-19 pneumonia and subsequently admitted to the hospitalist service.  Spittle course complicated by worsening hypoxia due to aspiration pneumonia.  See below for further details.   Significant events: 8/23>> COVID-19 positive at SNF 8/29>> admit-hypoxemia-COVID-19 pneumonia. 9/03>> worsening hypoxemia-suspicion for aspiration. 9/04>> on NRB-started Unasyn-restarted IV steroids 9/05>> improved-on 4 L of HFNC.   Significant studies: 8/29>> CXR: Airspace opacities in the right lower lung. 9/04>> CXR: Patchy bilateral airspace disease-progressive in the right upper lobe.   Significant microbiology data: 8/29>> COVID-19 PCR: Positive 8/29>> blood cultures: No growth   Subjective:   Patient in bed, appears comfortable, denies any headache, no fever, no chest pain or pressure, no shortness of breath , no abdominal pain. No new focal weakness, ++ BM yesterday.   Assessment  & Plan :    Acute hypoxic respiratory failure due to COVID-19 pneumonia-and superimposed aspiration pneumonia: Initially treated for COVID-pneumonia with  steroids/Remdesivir-she improved-however hospital course was complicated by worsening hypoxemia due to aspiration pneumonia.  She was started on Unasyn-and other supportive care.  Thankfully she started improving again-she is down to 1 L of oxygen appears stable.  We will continue on oral antimicrobial therapy for a few more days, and continue on tapering prednisone until she is back on her usual dose of prednisone.  She is stable for discharge family appealing discharge process, await decision.   Thrombocytopenia: Mild-due to COVID-resolved.    PAF: Maintaining sinus rhythm-continue amiodarone-continue Xarelto.   Chronic diastolic heart  failure: Remains euvolemic-was maintained on furosemide for negative balance.   Steroid-dependent asthma: Not in exacerbation-continue bronchodilators-on steroids chronically.   Dementia: Seems to be mildly/pleasantly confused-no family at bedside-but suspect not far from baseline.  Maintain delirium precautions.   GERD: Continue PPI   Constipation mild nausea: Improved with bowel regimen now symptom-free.        Condition - Fair  Family Communication  :  None present  Code Status :  Full  Consults  :  None  PUD Prophylaxis : PPI   Procedures  :            Disposition Plan  :    Status is: Inpatient  DVT Prophylaxis  :     rivaroxaban (XARELTO) tablet 20 mg     Lab Results  Component Value Date   PLT 223 03/31/2022    Diet :  Diet Order             Diet Heart Room service appropriate? Yes with Assist; Fluid consistency: Thin  Diet effective now           Diet - low sodium heart healthy                    Inpatient Medications  Scheduled Meds:  amiodarone  200 mg Oral Daily   arformoterol  15 mcg Nebulization BID   brimonidine  1 drop Left Eye TID   budesonide (PULMICORT) nebulizer solution  0.25 mg Nebulization BID   docusate sodium  200 mg Oral BID   furosemide  40 mg Oral Daily   guaiFENesin  1,200 mg Oral  BID   insulin aspart  0-5 Units Subcutaneous QHS   insulin aspart  0-9 Units Subcutaneous TID WC   irbesartan  37.5 mg Oral Daily   latanoprost  1 drop Both Eyes QHS   levothyroxine  75 mcg Oral QAC breakfast   pantoprazole  40 mg Oral BID   polyethylene glycol  17 g Oral Daily   predniSONE  30 mg Oral Q breakfast   revefenacin  175 mcg Nebulization Daily   rivaroxaban  20 mg Oral Q supper   senna-docusate  1 tablet Oral QHS   sodium chloride flush  3 mL Intravenous Q12H   Continuous Infusions:   PRN Meds:.acetaminophen, diphenhydrAMINE, levalbuterol, LORazepam, menthol-cetylpyridinium, ondansetron (ZOFRAN) IV, polyvinyl alcohol, traMADol  Time Spent in minutes  30   Lala Lund M.D on 04/04/2022 at 10:01 AM  To page go to [email or web address]   [facility] -  Office  [phone]  See all Orders from today for further details    Objective:   Vitals:   04/04/22 0000 04/04/22 0400 04/04/22 0800 04/04/22 0917  BP: 120/66 138/70 (!) 130/58   Pulse: (!) 50 (!) 53 63   Resp: '18 20 20   '$ Temp: 97.9 F (36.6 C) 98.1 F (36.7 C)    TempSrc: Oral Oral    SpO2: 94% 94%  91%    Wt Readings from Last 3 Encounters:  02/02/22 62.1 kg  01/19/22 62.6 kg  10/22/21 63.5 kg     Intake/Output Summary (Last 24 hours) at 04/04/2022 1001 Last data filed at 04/03/2022 1700 Gross per 24 hour  Intake --  Output 600 ml  Net -600 ml     Physical Exam  Awake Alert, No new F.N deficits, Normal affect [place].AT,PERRAL Supple Neck, No JVD,   Symmetrical Chest wall movement, Good air movement bilaterally, CTAB RRR,No Gallops, Rubs or new Murmurs,  +  ve B.Sounds, Abd Soft, No tenderness,   No Cyanosis, Clubbing or edema       Data Review:    CBC Recent Labs  Lab 03/29/22 0334 03/31/22 0943  WBC 17.5* 13.7*  HGB 13.6 13.0  HCT 40.6 41.1  PLT 199 223  MCV 90.4 93.4  MCH 30.3 29.5  MCHC 33.5 31.6  RDW 16.5* 16.2*  LYMPHSABS 0.5*  --   MONOABS 0.7  --   EOSABS 0.0  --    BASOSABS 0.0  --     Electrolytes Recent Labs  Lab 03/29/22 0334 03/30/22 0622 03/31/22 0943 04/02/22 0310  NA 143 146* 141 141  K 4.0 3.8 4.0 3.8  CL 107 104 103 104  CO2 '28 31 29 28  '$ GLUCOSE 130* 179* 161* 91  BUN 26* 23 28* 22  CREATININE 0.67 0.68 0.62 0.59  CALCIUM 8.3* 8.4* 8.2* 7.9*  AST 19  --   --   --   ALT 32  --   --   --   ALKPHOS 48  --   --   --   BILITOT 0.5  --   --   --   ALBUMIN 2.2*  --   --   --   MG 2.2  --   --   --   CRP 18.8*  --   --  4.0*  BNP 126.7*  --   --   --     ------------------------------------------------------------------------------------------------------------------ No results for input(s): "CHOL", "HDL", "LDLCALC", "TRIG", "CHOLHDL", "LDLDIRECT" in the last 72 hours.  Lab Results  Component Value Date   HGBA1C 5.8 (H) 03/28/2022    No results for input(s): "TSH", "T4TOTAL", "T3FREE", "THYROIDAB" in the last 72 hours.  Invalid input(s): "FREET3" ------------------------------------------------------------------------------------------------------------------ ID Labs Recent Labs  Lab 03/29/22 0334 03/30/22 0622 03/31/22 0943 04/02/22 0310  WBC 17.5*  --  13.7*  --   PLT 199  --  223  --   CRP 18.8*  --   --  4.0*  CREATININE 0.67 0.68 0.62 0.59   Cardiac Enzymes No results for input(s): "CKMB", "TROPONINI", "MYOGLOBIN" in the last 168 hours.  Invalid input(s): "CK"  Micro Results No results found for this or any previous visit (from the past 240 hour(s)).  Radiology Reports DG Abd Portable 1V  Result Date: 04/03/2022 CLINICAL DATA:  Nausea. EXAM: PORTABLE ABDOMEN - 1 VIEW COMPARISON:  None Available. FINDINGS: No gaseous small bowel dilatation to suggest obstruction. Air in stool are seen scattered along the course of a mildly distended colon. Bones are diffusely demineralized. Calcification over the right abdomen could be related to the right kidney. Thoracolumbar scoliosis evident. IMPRESSION: 1. No evidence  for small bowel obstruction. CT imaging could be used to further evaluate as clinically warranted. 2. Probable right renal stone as noted on CT scan 05/18/2021. Electronically Signed   By: Misty Stanley M.D.   On: 04/03/2022 10:05

## 2022-04-04 NOTE — Progress Notes (Signed)
Occupational Therapy Treatment Patient Details Name: Stacey Davenport MRN: [id number] DOB: 09-29-1940 Today's Date: 04/04/2022   History of present illness Pt is an 81 year old woman admitted on 03/23/22 from [facility] with SOB, fatigue, hypoxic respiratory failure secondary to COVID 19. PMH: dementia, HTN, HLD, asthma, PAF, hiatal hernia.   OT comments  Pt received in bed, RN cleared. Now on RA. O2 remained >=93% with rest and activity. Noted fatigue limiting participation and leading to need for increased A for functional transfers and ADLs but overall pt with good participation. Continues to report fear of falling, and limited at times by [facility] and ?cognition with decreased sequencing of events. Pt tolerated full self care session while seated and standing EOB for up to 15 minutes. With worsening fatigue noted with tendency for strong posterior R lateral lean (following spinal curvature) for rest breaks requiring cues for return to task. Pt agreeable for transition to recliner, requiring up to mod A for pivot d/t fatigue. Overall pt with good participation and continues to be appropriate for d/c to SNF. OT will continue to follow acutely.    Recommendations for follow up therapy are one component of a multi-disciplinary discharge planning process, led by the attending physician.  Recommendations may be updated based on patient status, additional functional criteria and insurance authorization.    Follow Up Recommendations  Skilled nursing-short term rehab (<3 hours/day)    Assistance Recommended at Discharge Frequent or constant Supervision/Assistance  Patient can return home with the following  A lot of help with walking and/or transfers;Assistance with cooking/housework;Assistance with feeding;A lot of help with bathing/dressing/bathroom   Equipment Recommendations  None recommended by OT    Recommendations for Other Services      Precautions / Restrictions Precautions Precautions:  Fall Precaution Comments: off O2 via [place] Restrictions Weight Bearing Restrictions: No       Mobility Bed Mobility Overal bed mobility: Needs Assistance Bed Mobility: Supine to Sit     Supine to sit: Min assist, HOB elevated     General bed mobility comments: increased time for sequencing, reliance on bed rails, difficulty with transition of hips to EOB.    Transfers Overall transfer level: Needs assistance Equipment used: 1 person hand held assist Transfers: Sit to/from Stand, Bed to chair/wheelchair/BSC Sit to Stand: Min assist, From elevated surface Stand pivot transfers: Mod assist         General transfer comment: A for piower up, but able to sustaining standing with UE support with min A     Balance Overall balance assessment: Needs assistance Sitting-balance support: Feet supported, No upper extremity supported Sitting balance-Leahy Scale: Fair Sitting balance - Comments: persistent tendency for posterior lateral R lean opposite of curviture. worsened with fatigue                                   ADL either performed or assessed with clinical judgement   ADL   Eating/Feeding: Set up;Sitting Eating/Feeding Details (indicate cue type and reason): at eOB Grooming: Sitting;Set up Grooming Details (indicate cue type and reason): in recliner, increased time for oral care, cues for attention to face washing but able to do without intervention from therapist Upper Body Bathing: Minimal assistance;Sitting Upper Body Bathing Details (indicate cue type and reason): EOB, intermittent rest breaks taken Lower Body Bathing: Moderate assistance;Sitting/lateral leans   Upper Body Dressing : Supervision/safety;Sitting Upper Body Dressing Details (indicate cue type and  reason): donn/doff gown, A for line management     Toilet Transfer: Moderate assistance;Stand-pivot Toilet Transfer Details (indicate cue type and reason): simulated bed>recliner. noted fatigue  from seated ADL session to start leading to increased A on second trial for transition to and from standing. expressing fear of falling throughout session Toileting- Clothing Manipulation and Hygiene: Total assistance;Sit to/from stand Toileting - Clothing Manipulation Details (indicate cue type and reason): incontinence noted in bed. unable to participate in peri care at this time       General ADL Comments: increased time for processing and completion of all tasks, sequencing fair but decreased attentiopn needing frequent redirection    Extremity/Trunk Assessment              Vision       Perception     Praxis      Cognition Arousal/Alertness: Awake/alert Behavior During Therapy: WFL for tasks assessed/performed Overall Cognitive Status: History of cognitive impairments - at baseline                                 General Comments: decreased memory observed, sloed processing and decreased problem solving. reporting fear of falling        Exercises      Shoulder Instructions       General Comments redness to groin with removal of purwik. RN aware    Pertinent Vitals/ Pain       Pain Assessment Pain Assessment: No/denies pain Pain Score: 0-No pain  Home Living                                          Prior Functioning/Environment              Frequency  Min 2X/week        Progress Toward Goals  OT Goals(current goals can now be found in the care plan section)  Progress towards OT goals: Progressing toward goals  Acute Rehab OT Goals OT Goal Formulation: With patient Time For Goal Achievement: 04/08/22 Potential to Achieve Goals: Good  Plan Discharge plan remains appropriate    Co-evaluation                 AM-PAC OT "6 Clicks" Daily Activity     Outcome Measure   Help from another person eating meals?: A Little Help from another person taking care of personal grooming?: A Little Help from another  person toileting, which includes using toliet, bedpan, or urinal?: A Lot Help from another person bathing (including washing, rinsing, drying)?: A Lot Help from another person to put on and taking off regular upper body clothing?: A Little Help from another person to put on and taking off regular lower body clothing?: A Lot 6 Click Score: 15    End of Session Equipment Utilized During Treatment: Gait belt  OT Visit Diagnosis: Unsteadiness on feet (R26.81);Other abnormalities of gait and mobility (R26.89);Muscle weakness (generalized) (M62.81);Other symptoms and signs involving cognitive function   Activity Tolerance Patient limited by fatigue   Patient Left in chair;with call bell/phone within reach   Nurse Communication Mobility status        Time: 0865-7846 OT Time Calculation (min): 34 min  Charges: OT General Charges $OT Visit: 1 Visit OT Treatments $Self Care/Home Management : 23-37 mins  Tyberius Ryner Emmanuelle Coxe MSOT OTR/L acute  rehab services Office: [place] 04/04/2022, 2:42 PM

## 2022-04-04 NOTE — Care Management (Addendum)
Current Status of Appeal Per Judithann Graves Website Case ID [id number]   Overall Status Appeal Case: is in Clinical Review.  1. Discharge Appeal Started 04/02/2022 13:55:39  2. Medical Record Requested 04/02/2022 14:16:01  3. Documents Received 04/02/2022 15:16:09  4. In Clinical Review 04/02/2022 17:25:05   UPDATE 12:20 Financial Liability has been determined   6. Financial Liability Determined The Physician agreed with the notice. Beneficiary Liability Starts on 04/05/2022.  Patient will incur cost of continued stay at noon on Monday 04/05/22  MD made aware. Kepro calls family to update them on determination. 1200 Attempted to call son, Harrie Jeans, who initiated appeal; no answer.  1345 Attempted to call son, Harrie Jeans, who initiated appeal; no answer. LVM 14:30 Received call back from son, Harrie Jeans. He would like patient to DC to [facility] in the morning. Chuck informed financial responsibility starts at noon.  Chrisanne, Loose Son   [phone]

## 2022-04-04 NOTE — Progress Notes (Signed)
   Palliative Medicine Inpatient Follow Up Note  HPI: Patient is a 81 y.o.  female with history of  asthma-on chronic steroids, HTN, HLD, CVA, dementia, PAF on Xarelto-who tested positive for COVID-19 at SNF on 8/23-presented to the hospital with worsening shortness of breath/fatigue-found to have acute hypoxic respiratory failure due to COVID-19 pneumonia.   Palliative care was consulted for goals of care in the setting of severe COVID PNA.   Today's Discussion 04/04/2022  *Please note that this is a verbal dictation therefore any spelling or grammatical errors are due to the "[place] One" system interpretation.  Chart reviewed inclusive of vital signs, progress notes, laboratory results, and diagnostic images.   I met at bedside with Ezell this morning.She is alert and oriented, noted to be on RA this morning. She shares with me that she is feeling quite improved. He symptoms of SOB and nausea have improved. She only complains of the desire to have a bowel movement. We discussed that she had one yesterday though despite this she still feels backed up. We talked about some ways to help her constipation now and in the future.   Discussed plan for transition to rehab once placement can be agreed upon.   Questions and concerns addressed/Palliative Support Provided.   Objective Assessment: Vital Signs Vitals:   04/04/22 0800 04/04/22 0917  BP: (!) 130/58   Pulse: 63   Resp: 20   Temp:    SpO2:  91%    Intake/Output Summary (Last 24 hours) at 04/04/2022 1304 Last data filed at 04/03/2022 1700 Gross per 24 hour  Intake --  Output 600 ml  Net -600 ml    Gen:  Frail elderly caucasian F in NAD HEENT: dry mucus membranes CV: Regular rate and rhythm PULM: On RA, breathing is even and nonlabored ABD: soft/nontender EXT: No edema Neuro: Alert and oriented x3  SUMMARY OF RECOMMENDATIONS   - DNAR/DNI --> in the event of deterioration, would pursue comfort/ hospice route -  Continue present medical treatment  - Will need short term SNF placement for strengthening --> Son Appealed [facility] on 9/8 - Continue zofran for nausea --> Last dose given this AM at 532 - Change Miralax to BID --> DLBM 9/9 - Palliative care will continue to incrementally follow along  Billing based on MDM: Moderate  ______________________________________________________________________________________ [place] Team Team Cell Phone: [phone] Please utilize secure chat with additional questions, if there is no response within 30 minutes please call the above phone number  Palliative Medicine Team providers are available by phone from 7am to 7pm daily and can be reached through the team cell phone.  Should this patient require assistance outside of these hours, please call the patient's attending physician.

## 2022-04-05 DIAGNOSIS — Z515 Encounter for palliative care: Secondary | ICD-10-CM | POA: Diagnosis not present

## 2022-04-05 LAB — GLUCOSE, CAPILLARY: Glucose-Capillary: 179 mg/dL — ABNORMAL HIGH (ref 70–99)

## 2022-04-05 MED ORDER — MAGNESIUM HYDROXIDE 400 MG/5ML PO SUSP
15.0000 mL | Freq: Once | ORAL | Status: AC
  Administered 2022-04-05: 15 mL via ORAL
  Filled 2022-04-05: qty 30

## 2022-04-05 MED ORDER — FUROSEMIDE 40 MG PO TABS: 40.0000 mg | ORAL_TABLET | Freq: Every day | ORAL | Status: DC

## 2022-04-05 NOTE — TOC Transition Note (Addendum)
Transition of Care [facility]) - CM/SW Discharge Note   Patient Details  Name: Stacey Davenport MRN: [id number] Date of Birth: 07/15/1941  Transition of Care [facility]) CM/SW Contact:  Benard Halsted, LCSW Phone Number: 04/05/2022, 9:47 AM   Clinical Narrative:    Patient will DC to: [facility] Anticipated DC date: 04/05/22 Family notified: Son, Harrie Jeans Transport by: [facility] 11am   Per MD patient ready for DC to [place] [facility]. RN to call report prior to discharge [phone]. should be room 130A). RN, patient, patient's family, and facility notified of DC. Discharge Summary and FL2 sent to facility.   CSW will sign off for now as social work intervention is no longer needed. Please consult [place] again if new needs arise.     Final next level of care: Skilled Nursing Facility Barriers to Discharge: Barriers Resolved   Patient Goals and CMS Choice Patient states their goals for this hospitalization and ongoing recovery are:: Rehab CMS Medicare.gov Compare Post Acute Care list provided to:: Patient Represenative (must comment) Choice offered to / list presented to : Adult Children  Discharge Placement   Existing PASRR number confirmed : 04/02/22            Patient to be transferred to facility by: [facility] w/O2 Name of family member notified: Daughter Patient and family notified of of transfer: 04/05/22  Discharge Plan and Services In-house Referral: Clinical Social Work   Post Acute Care Choice: [place]                               Social Determinants of Health (SDOH) Interventions     Readmission Risk Interventions     No data to display

## 2022-04-05 NOTE — Care Management Important Message (Signed)
Important Message  Patient Details  Name: Stacey Davenport MRN: [id number] Date of Birth: 30-Mar-1941   Medicare Important Message Given:  Yes     [name] [address] 04/05/2022, 1:17 PM

## 2022-04-05 NOTE — Progress Notes (Signed)
   Palliative Medicine Inpatient Follow Up Note HPI: Patient is a 81 y.o.  female with history of  asthma-on chronic steroids, HTN, HLD, CVA, dementia, PAF on Xarelto-who tested positive for COVID-19 at SNF on 8/23-presented to the hospital with worsening shortness of breath/fatigue-found to have acute hypoxic respiratory failure due to COVID-19 pneumonia.   Palliative care was consulted for goals of care in the setting of severe COVID PNA.   Today's Discussion 04/05/2022  *Please note that this is a verbal dictation therefore any spelling or grammatical errors are due to the "[place] One" system interpretation.  Chart reviewed inclusive of vital signs, progress notes, laboratory results, and diagnostic images.   I met at bedside with Stacey Davenport this morning. Her nauseas has improved and she is eating a hearty breakfast. She does share that she wants to have a large BM. In the past MOM has worked for her. She requests a dose of this. I shared the bowel regiment that she is presently on though despite this she continues to feel no relief.   She has been mobilizing to the chair and is off oxygen support. She shares readiness to discharge when a facility is found.   Questions and concerns addressed/Palliative Support Provided.   Objective Assessment: Vital Signs Vitals:   04/05/22 0555 04/05/22 0807  BP: 92/60 110/66  Pulse: (!) 53 73  Resp: 16 16  Temp:  97.9 F (36.6 C)  SpO2: 95% 99%    Intake/Output Summary (Last 24 hours) at 04/05/2022 1202 Last data filed at 04/05/2022 0300 Gross per 24 hour  Intake 3 ml  Output 900 ml  Net -897 ml    Gen:  Frail elderly caucasian F in NAD HEENT: dry mucus membranes CV: Regular rate and rhythm PULM: On RA, breathing is even and nonlabored ABD: soft/nontender EXT: No edema Neuro: Alert and oriented x3  SUMMARY OF RECOMMENDATIONS   - DNAR/DNI --> in the event of deterioration, would pursue comfort/ hospice route - Continue present  medical treatment  - Will need short term SNF placement for strengthening --> Son Appealed Stacey Davenport on 9/8 - Continue zofran for nausea --> Last dose given 9/10 - Change Miralax to BID --> DLBM 9/9 - Plan for transition to [facility] today  Billing based on MDM: Moderate  ______________________________________________________________________________________ [place] Team Team Cell Phone: [phone] Please utilize secure chat with additional questions, if there is no response within 30 minutes please call the above phone number  Palliative Medicine Team providers are available by phone from 7am to 7pm daily and can be reached through the team cell phone.  Should this patient require assistance outside of these hours, please call the patient's attending physician.

## 2022-04-11 IMAGING — DX DG CHEST 1V PORT
1 series · 1 of 1 positions shown · non-contrast
Comparison: 05/05/2021

CLINICAL DATA: Increasing shortness of breath for 3 days

EXAM:
PORTABLE CHEST 1 VIEW

[chest ap]
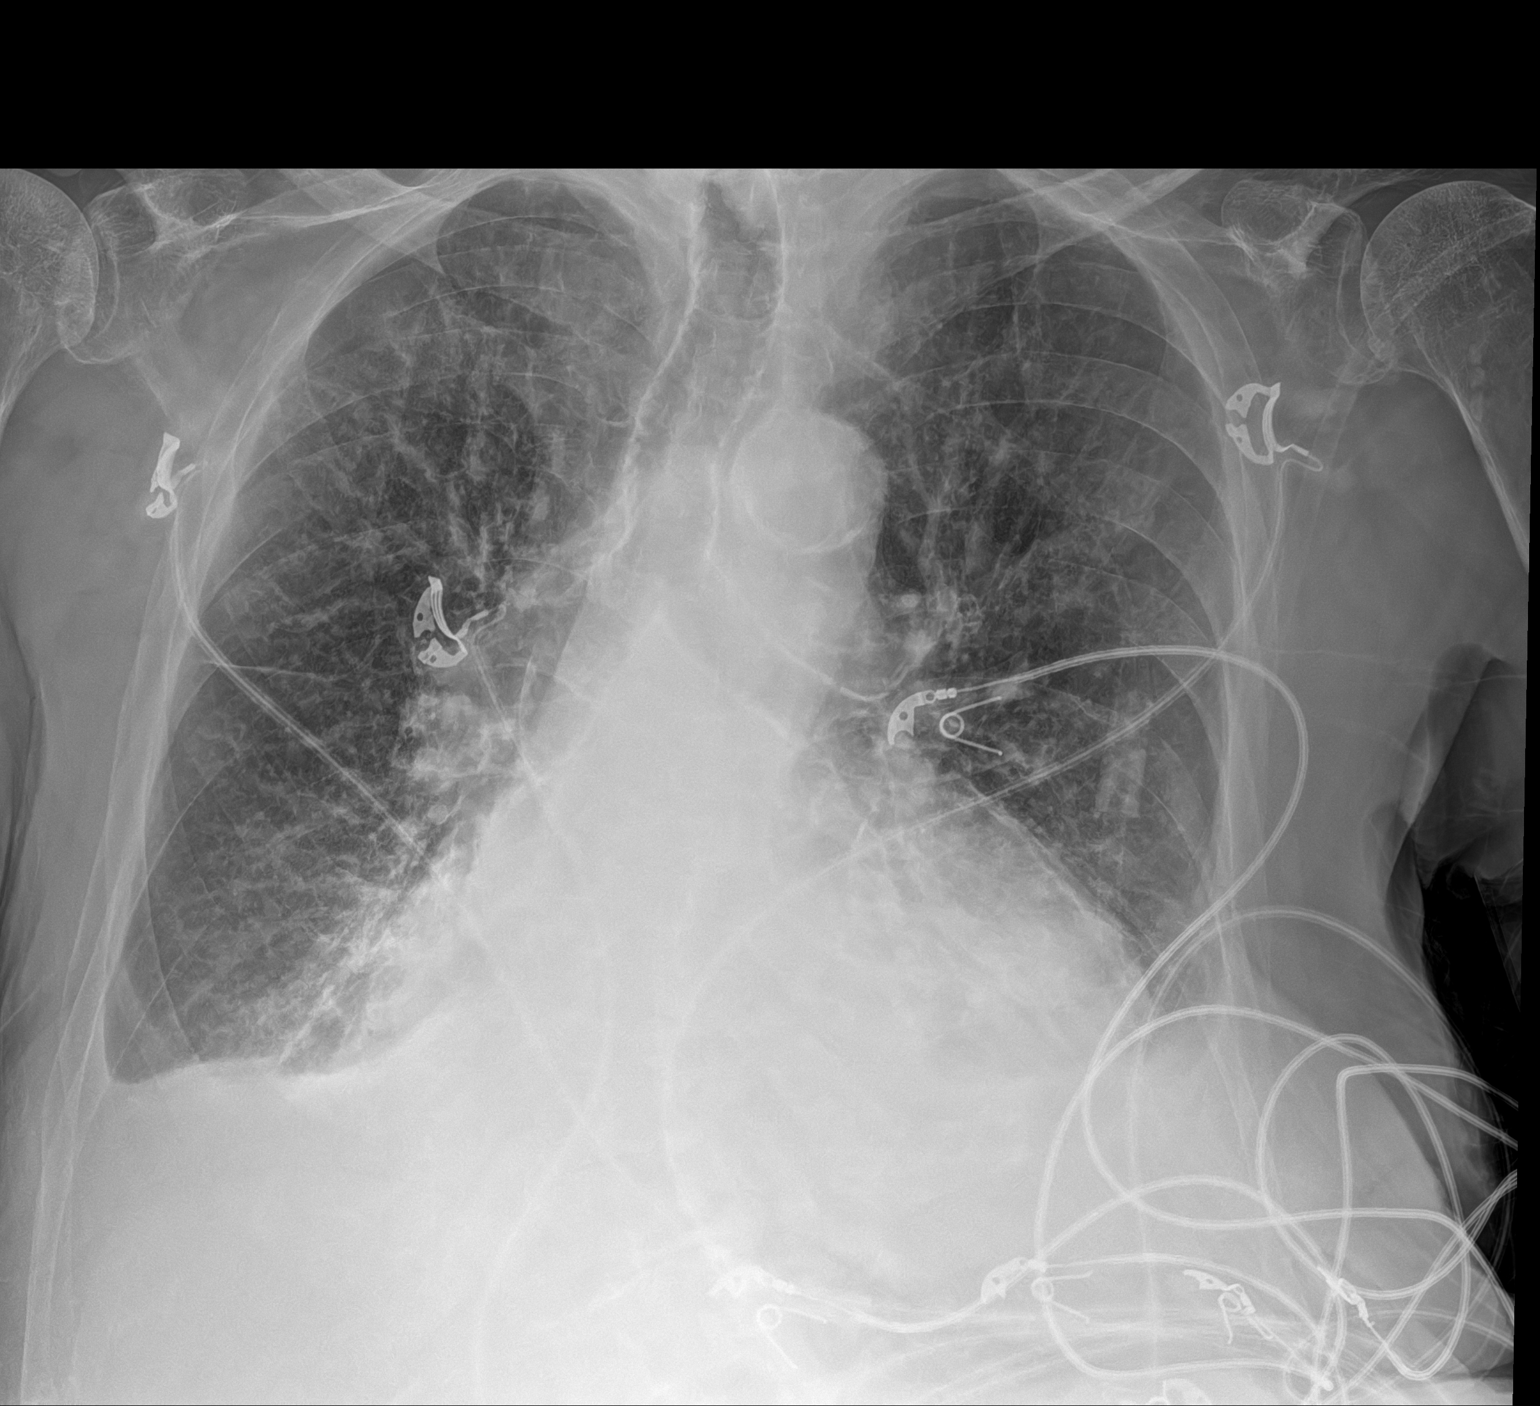

[1 of 1 positions shown; findings below may reference images not displayed]

FINDINGS: Single frontal view of the chest demonstrates stable enlargement of
the cardiac silhouette. There is increased central vascular
congestion, with patchy bibasilar airspace disease and small
bilateral effusions. No pneumothorax. No acute bony abnormalities.
IMPRESSION: 1. Findings consistent with mild congestive heart failure.

## 2022-04-30 ENCOUNTER — Inpatient Hospital Stay (HOSPITAL_COMMUNITY)
Admission: EM | Admit: 2022-04-30 | Discharge: 2022-05-04 | DRG: 871 | Disposition: A | Discharge status: Skilled Nursing Facility | Payer: Medicare (Managed Care) | Source: Skilled Nursing Facility | Attending: Internal Medicine | Admitting: Internal Medicine

## 2022-04-30 ENCOUNTER — Encounter (HOSPITAL_COMMUNITY): Payer: Self-pay | Admitting: Emergency Medicine

## 2022-04-30 ENCOUNTER — Emergency Department (HOSPITAL_COMMUNITY): Payer: Medicare (Managed Care)

## 2022-04-30 ENCOUNTER — Other Ambulatory Visit: Payer: Self-pay

## 2022-04-30 DIAGNOSIS — F32A Depression, unspecified: Secondary | ICD-10-CM | POA: Diagnosis present

## 2022-04-30 DIAGNOSIS — R6520 Severe sepsis without septic shock: Secondary | ICD-10-CM | POA: Diagnosis not present

## 2022-04-30 DIAGNOSIS — I480 Paroxysmal atrial fibrillation: Secondary | ICD-10-CM | POA: Diagnosis present

## 2022-04-30 DIAGNOSIS — Z8673 Personal history of transient ischemic attack (TIA), and cerebral infarction without residual deficits: Secondary | ICD-10-CM

## 2022-04-30 DIAGNOSIS — J9611 Chronic respiratory failure with hypoxia: Secondary | ICD-10-CM | POA: Diagnosis present

## 2022-04-30 DIAGNOSIS — E860 Dehydration: Secondary | ICD-10-CM | POA: Diagnosis present

## 2022-04-30 DIAGNOSIS — Z9841 Cataract extraction status, right eye: Secondary | ICD-10-CM

## 2022-04-30 DIAGNOSIS — I5032 Chronic diastolic (congestive) heart failure: Secondary | ICD-10-CM | POA: Diagnosis not present

## 2022-04-30 DIAGNOSIS — Z20822 Contact with and (suspected) exposure to covid-19: Secondary | ICD-10-CM | POA: Diagnosis present

## 2022-04-30 DIAGNOSIS — E039 Hypothyroidism, unspecified: Secondary | ICD-10-CM | POA: Diagnosis not present

## 2022-04-30 DIAGNOSIS — Z881 Allergy status to other antibiotic agents status: Secondary | ICD-10-CM

## 2022-04-30 DIAGNOSIS — I10 Essential (primary) hypertension: Secondary | ICD-10-CM | POA: Diagnosis present

## 2022-04-30 DIAGNOSIS — Z7951 Long term (current) use of inhaled steroids: Secondary | ICD-10-CM

## 2022-04-30 DIAGNOSIS — Z91040 Latex allergy status: Secondary | ICD-10-CM

## 2022-04-30 DIAGNOSIS — L89622 Pressure ulcer of left heel, stage 2: Secondary | ICD-10-CM | POA: Diagnosis present

## 2022-04-30 DIAGNOSIS — Z9842 Cataract extraction status, left eye: Secondary | ICD-10-CM

## 2022-04-30 DIAGNOSIS — M069 Rheumatoid arthritis, unspecified: Secondary | ICD-10-CM | POA: Diagnosis not present

## 2022-04-30 DIAGNOSIS — Z7901 Long term (current) use of anticoagulants: Secondary | ICD-10-CM

## 2022-04-30 DIAGNOSIS — I4891 Unspecified atrial fibrillation: Secondary | ICD-10-CM | POA: Diagnosis present

## 2022-04-30 DIAGNOSIS — Z7952 Long term (current) use of systemic steroids: Secondary | ICD-10-CM

## 2022-04-30 DIAGNOSIS — J189 Pneumonia, unspecified organism: Secondary | ICD-10-CM | POA: Diagnosis present

## 2022-04-30 DIAGNOSIS — Z8249 Family history of ischemic heart disease and other diseases of the circulatory system: Secondary | ICD-10-CM

## 2022-04-30 DIAGNOSIS — H409 Unspecified glaucoma: Secondary | ICD-10-CM | POA: Diagnosis present

## 2022-04-30 DIAGNOSIS — R7881 Bacteremia: Secondary | ICD-10-CM

## 2022-04-30 DIAGNOSIS — Z79899 Other long term (current) drug therapy: Secondary | ICD-10-CM

## 2022-04-30 DIAGNOSIS — K219 Gastro-esophageal reflux disease without esophagitis: Secondary | ICD-10-CM | POA: Diagnosis present

## 2022-04-30 DIAGNOSIS — J9811 Atelectasis: Secondary | ICD-10-CM | POA: Diagnosis present

## 2022-04-30 DIAGNOSIS — F419 Anxiety disorder, unspecified: Secondary | ICD-10-CM | POA: Diagnosis present

## 2022-04-30 DIAGNOSIS — Z66 Do not resuscitate: Secondary | ICD-10-CM | POA: Diagnosis present

## 2022-04-30 DIAGNOSIS — E785 Hyperlipidemia, unspecified: Secondary | ICD-10-CM | POA: Diagnosis not present

## 2022-04-30 DIAGNOSIS — Z8616 Personal history of COVID-19: Secondary | ICD-10-CM

## 2022-04-30 DIAGNOSIS — L89619 Pressure ulcer of right heel, unspecified stage: Secondary | ICD-10-CM | POA: Diagnosis present

## 2022-04-30 DIAGNOSIS — A419 Sepsis, unspecified organism: Secondary | ICD-10-CM | POA: Diagnosis present

## 2022-04-30 DIAGNOSIS — Z79890 Hormone replacement therapy (postmenopausal): Secondary | ICD-10-CM

## 2022-04-30 DIAGNOSIS — F0393 Unspecified dementia, unspecified severity, with mood disturbance: Secondary | ICD-10-CM | POA: Diagnosis present

## 2022-04-30 DIAGNOSIS — A4151 Sepsis due to Escherichia coli [E. coli]: Principal | ICD-10-CM | POA: Diagnosis present

## 2022-04-30 DIAGNOSIS — I110 Hypertensive heart disease with heart failure: Secondary | ICD-10-CM | POA: Diagnosis present

## 2022-04-30 DIAGNOSIS — Z882 Allergy status to sulfonamides status: Secondary | ICD-10-CM

## 2022-04-30 DIAGNOSIS — E876 Hypokalemia: Secondary | ICD-10-CM | POA: Diagnosis not present

## 2022-04-30 DIAGNOSIS — Z833 Family history of diabetes mellitus: Secondary | ICD-10-CM

## 2022-04-30 DIAGNOSIS — Z888 Allergy status to other drugs, medicaments and biological substances status: Secondary | ICD-10-CM

## 2022-04-30 DIAGNOSIS — J45909 Unspecified asthma, uncomplicated: Secondary | ICD-10-CM | POA: Diagnosis not present

## 2022-04-30 DIAGNOSIS — F05 Delirium due to known physiological condition: Secondary | ICD-10-CM | POA: Diagnosis present

## 2022-04-30 DIAGNOSIS — Z885 Allergy status to narcotic agent status: Secondary | ICD-10-CM

## 2022-04-30 DIAGNOSIS — E079 Disorder of thyroid, unspecified: Secondary | ICD-10-CM | POA: Diagnosis present

## 2022-04-30 LAB — HEPATIC FUNCTION PANEL
ALT: 13 U/L (ref 0–44)
AST: 23 U/L (ref 15–41)
Albumin: 2.8 g/dL — ABNORMAL LOW (ref 3.5–5.0)
Alkaline Phosphatase: 54 U/L (ref 38–126)
Bilirubin, Direct: 0.1 mg/dL (ref 0.0–0.2)
Indirect Bilirubin: 0.7 mg/dL (ref 0.3–0.9)
Total Bilirubin: 0.8 mg/dL (ref 0.3–1.2)
Total Protein: 5.5 g/dL — ABNORMAL LOW (ref 6.5–8.1)

## 2022-04-30 LAB — CBC WITH DIFFERENTIAL/PLATELET
Abs Immature Granulocytes: 0.25 10*3/uL — ABNORMAL HIGH (ref 0.00–0.07)
Basophils Absolute: 0.1 10*3/uL (ref 0.0–0.1)
Basophils Relative: 0 %
Eosinophils Absolute: 0 10*3/uL (ref 0.0–0.5)
Eosinophils Relative: 0 %
HCT: 42.5 % (ref 36.0–46.0)
Hemoglobin: 13.9 g/dL (ref 12.0–15.0)
Immature Granulocytes: 1 %
Lymphocytes Relative: 4 %
Lymphs Abs: 0.9 10*3/uL (ref 0.7–4.0)
MCH: 31.4 pg (ref 26.0–34.0)
MCHC: 32.7 g/dL (ref 30.0–36.0)
MCV: 96.2 fL (ref 80.0–100.0)
Monocytes Absolute: 1.3 10*3/uL — ABNORMAL HIGH (ref 0.1–1.0)
Monocytes Relative: 7 %
Neutro Abs: 17.5 10*3/uL — ABNORMAL HIGH (ref 1.7–7.7)
Neutrophils Relative %: 88 %
Platelets: 231 10*3/uL (ref 150–400)
RBC: 4.42 MIL/uL (ref 3.87–5.11)
RDW: 14.9 % (ref 11.5–15.5)
WBC: 20.1 10*3/uL — ABNORMAL HIGH (ref 4.0–10.5)
nRBC: 0 % (ref 0.0–0.2)

## 2022-04-30 LAB — BASIC METABOLIC PANEL
Anion gap: 12 (ref 5–15)
BUN: 10 mg/dL (ref 8–23)
CO2: 22 mmol/L (ref 22–32)
Calcium: 8.3 mg/dL — ABNORMAL LOW (ref 8.9–10.3)
Chloride: 105 mmol/L (ref 98–111)
Creatinine, Ser: 0.82 mg/dL (ref 0.44–1.00)
GFR, Estimated: >60 mL/min (ref >60)
Glucose, Bld: 101 mg/dL — ABNORMAL HIGH (ref 70–99)
Potassium: 2.9 mmol/L — ABNORMAL LOW (ref 3.5–5.1)
Sodium: 139 mmol/L (ref 135–145)

## 2022-04-30 LAB — SARS CORONAVIRUS 2 BY RT PCR: SARS Coronavirus 2 by RT PCR: NEGATIVE

## 2022-04-30 LAB — I-STAT VENOUS BLOOD GAS, ED
Acid-Base Excess: 0 mmol/L (ref 0.0–2.0)
Bicarbonate: 23.3 mmol/L (ref 20.0–28.0)
Calcium, Ion: 1.01 mmol/L — ABNORMAL LOW (ref 1.15–1.40)
HCT: 37 % (ref 36.0–46.0)
Hemoglobin: 12.6 g/dL (ref 12.0–15.0)
O2 Saturation: 93 %
Potassium: 2.8 mmol/L — ABNORMAL LOW (ref 3.5–5.1)
Sodium: 139 mmol/L (ref 135–145)
TCO2: 24 mmol/L (ref 22–32)
pCO2, Ven: 34.7 mmHg — ABNORMAL LOW (ref 44–60)
pH, Ven: 7.436 — ABNORMAL HIGH (ref 7.25–7.43)
pO2, Ven: 64 mmHg — ABNORMAL HIGH (ref 32–45)

## 2022-04-30 LAB — BRAIN NATRIURETIC PEPTIDE: B Natriuretic Peptide: 179.5 pg/mL — ABNORMAL HIGH (ref 0.0–100.0)

## 2022-04-30 LAB — LACTIC ACID, PLASMA: Lactic Acid, Venous: 1.5 mmol/L (ref 0.5–1.9)

## 2022-04-30 LAB — LIPASE, BLOOD: Lipase: 27 U/L (ref 11–51)

## 2022-04-30 LAB — PROTIME-INR
INR: 1.4 — ABNORMAL HIGH (ref 0.8–1.2)
Prothrombin Time: 16.7 seconds — ABNORMAL HIGH (ref 11.4–15.2)

## 2022-04-30 MED ORDER — MOMETASONE FURO-FORMOTEROL FUM 200-5 MCG/ACT IN AERO
2.0000 | INHALATION_SPRAY | Freq: Two times a day (BID) | RESPIRATORY_TRACT | Status: DC
  Administered 2022-05-01 – 2022-05-04 (×8): 2 via RESPIRATORY_TRACT
  Filled 2022-04-30: qty 8.8

## 2022-04-30 MED ORDER — ENSURE ENLIVE PO LIQD
237.0000 mL | Freq: Two times a day (BID) | ORAL | Status: DC
  Administered 2022-05-01 – 2022-05-04 (×6): 237 mL via ORAL

## 2022-04-30 MED ORDER — POLYETHYLENE GLYCOL 3350 17 G PO PACK: 17.0000 g | PACK | Freq: Every day | ORAL | Status: DC | PRN

## 2022-04-30 MED ORDER — RIVAROXABAN 20 MG PO TABS
20.0000 mg | ORAL_TABLET | Freq: Every day | ORAL | Status: DC
  Administered 2022-05-01 – 2022-05-03 (×3): 20 mg via ORAL
  Filled 2022-04-30 (×3): qty 1

## 2022-04-30 MED ORDER — ACETAMINOPHEN 325 MG PO TABS
650.0000 mg | ORAL_TABLET | Freq: Four times a day (QID) | ORAL | Status: DC | PRN
  Administered 2022-05-03: 650 mg via ORAL
  Filled 2022-04-30: qty 2

## 2022-04-30 MED ORDER — POLYVINYL ALCOHOL 1.4 % OP SOLN: 1.0000 [drp] | Freq: Four times a day (QID) | OPHTHALMIC | Status: DC | PRN

## 2022-04-30 MED ORDER — SODIUM CHLORIDE 0.9% FLUSH
3.0000 mL | Freq: Two times a day (BID) | INTRAVENOUS | Status: DC
  Administered 2022-05-01 – 2022-05-04 (×6): 3 mL via INTRAVENOUS

## 2022-04-30 MED ORDER — AMIODARONE HCL 200 MG PO TABS
200.0000 mg | ORAL_TABLET | Freq: Every day | ORAL | Status: DC
  Administered 2022-05-01 – 2022-05-04 (×4): 200 mg via ORAL
  Filled 2022-04-30 (×4): qty 1

## 2022-04-30 MED ORDER — VANCOMYCIN HCL IN DEXTROSE 1-5 GM/200ML-% IV SOLN: 1000.0000 mg | INTRAVENOUS | Status: DC

## 2022-04-30 MED ORDER — PANTOPRAZOLE SODIUM 40 MG PO TBEC
40.0000 mg | DELAYED_RELEASE_TABLET | Freq: Every day | ORAL | Status: DC
  Administered 2022-05-01 – 2022-05-04 (×4): 40 mg via ORAL
  Filled 2022-04-30 (×4): qty 1

## 2022-04-30 MED ORDER — POTASSIUM CHLORIDE 20 MEQ PO PACK
60.0000 meq | PACK | Freq: Once | ORAL | Status: AC
  Administered 2022-04-30: 60 meq via ORAL
  Filled 2022-04-30: qty 3

## 2022-04-30 MED ORDER — VANCOMYCIN HCL 1500 MG/300ML IV SOLN
1500.0000 mg | Freq: Once | INTRAVENOUS | Status: AC
  Administered 2022-04-30: 1500 mg via INTRAVENOUS
  Filled 2022-04-30: qty 300

## 2022-04-30 MED ORDER — PREDNISONE 5 MG PO TABS
5.0000 mg | ORAL_TABLET | Freq: Every day | ORAL | Status: DC
  Administered 2022-05-01 – 2022-05-04 (×4): 5 mg via ORAL
  Filled 2022-04-30 (×4): qty 1

## 2022-04-30 MED ORDER — ACETAMINOPHEN 650 MG RE SUPP: 650.0000 mg | Freq: Four times a day (QID) | RECTAL | Status: DC | PRN

## 2022-04-30 MED ORDER — SACCHAROMYCES BOULARDII 250 MG PO CAPS
250.0000 mg | ORAL_CAPSULE | Freq: Every day | ORAL | Status: DC
  Administered 2022-05-01 – 2022-05-04 (×4): 250 mg via ORAL
  Filled 2022-04-30 (×4): qty 1

## 2022-04-30 MED ORDER — SODIUM CHLORIDE 0.9 % IV BOLUS
500.0000 mL | Freq: Once | INTRAVENOUS | Status: AC
  Administered 2022-05-01: 500 mL via INTRAVENOUS

## 2022-04-30 MED ORDER — PIPERACILLIN-TAZOBACTAM 3.375 G IVPB
3.3750 g | Freq: Three times a day (TID) | INTRAVENOUS | Status: DC
  Administered 2022-05-01 (×2): 3.375 g via INTRAVENOUS
  Filled 2022-04-30 (×2): qty 50

## 2022-04-30 MED ORDER — SODIUM CHLORIDE 0.9 % IV BOLUS
1000.0000 mL | Freq: Once | INTRAVENOUS | Status: AC
  Administered 2022-04-30: 1000 mL via INTRAVENOUS

## 2022-04-30 MED ORDER — SODIUM CHLORIDE 0.9 % IV SOLN
INTRAVENOUS | Status: DC
Start: 2022-04-30 — End: 2022-05-04

## 2022-04-30 MED ORDER — SODIUM CHLORIDE 0.9 % IV BOLUS
500.0000 mL | Freq: Once | INTRAVENOUS | Status: AC
  Administered 2022-04-30: 500 mL via INTRAVENOUS

## 2022-04-30 MED ORDER — LEVALBUTEROL HCL 0.63 MG/3ML IN NEBU: 0.6300 mg | INHALATION_SOLUTION | Freq: Four times a day (QID) | RESPIRATORY_TRACT | Status: DC | PRN

## 2022-04-30 MED ORDER — LATANOPROST 0.005 % OP SOLN
1.0000 [drp] | Freq: Every day | OPHTHALMIC | Status: DC
  Administered 2022-05-01 – 2022-05-03 (×4): 1 [drp] via OPHTHALMIC
  Filled 2022-04-30: qty 2.5

## 2022-04-30 MED ORDER — PIPERACILLIN-TAZOBACTAM 3.375 G IVPB 30 MIN
3.3750 g | Freq: Once | INTRAVENOUS | Status: AC
  Administered 2022-04-30: 3.375 g via INTRAVENOUS
  Filled 2022-04-30: qty 50

## 2022-04-30 NOTE — Progress Notes (Signed)
Pharmacy Antibiotic Note  Stacey Davenport is a 81 y.o. female for which pharmacy has been consulted for vancomycin dosing for pneumonia.  Patient with a history of steroid-dependent asthma, hypertension, hyperlipidemia, history of CVA, dementia, and PAF on Xarelto. Patient presenting with AMS/SOB.  Recent admission for COVID  SCr 0.82  WBC 20.1; LA 1.5; T 97; HR 68; RR 19  Plan: Zosyn x 1 per ED MD Vancomycin 1500 mg once then 1000 mg q24hr (eAUC 517) unless change in renal function Trend WBC, Fever, Renal function F/u cultures, clinical course, WBC, fever De-escalate when able Levels at steady state F/u MRSA PCR  Height: '5\' 3"'$  (160 cm) Weight: 65.9 kg (145 lb 3.2 oz) IBW/kg (Calculated) : 52.4  Temp (24hrs), Avg:97 F (36.1 C), Min:97 F (36.1 C), Max:97 F (36.1 C)  Recent Labs  Lab 04/30/22 2125  WBC 20.1*    CrCl cannot be calculated (Patient's most recent lab result is older than the maximum 21 days allowed.).    Allergies  Allergen Reactions   Sulfamethoxazole Nausea And Vomiting    Bladder infection NOT ON MAR   Aciphex [Rabeprazole Sodium]     Unknown reaction   Biaxin [Clarithromycin] Hives and Itching   Hydrocodone     NOT ON MAR    Ketek [Telithromycin]     NOT ON MAR   Latex     Unknown reaction    Levaquin [Levofloxacin In D5w]     Unknown reaction    Lipitor [Atorvastatin]     Unknown reaction    Macrobid [Nitrofurantoin Macrocrystal]     NOT ON MAR   Moxifloxacin     NOT ON MAR   Nexium [Esomeprazole Magnesium]    Sulfonamide Derivatives     NOT ON MAR    Vesicare [Solifenacin]     NOT ON MAR   Zithromax [Azithromycin]     NOT ON MAR    Cephalexin Itching and Rash   Antimicrobials this admission: zosyn 10/6 >>  vancomycin 10/6 >>  Microbiology results: Pending  Thank you for allowing pharmacy to be a part of this patient's care.  Lorelei Pont, PharmD, BCPS 04/30/2022 10:09 PM ED Clinical Pharmacist -  [phone]

## 2022-04-30 NOTE — ED Provider Notes (Deleted)
MSE was initiated and I personally evaluated the patient and placed orders (if any) at  8:54 PM on April 30, 2022.  The patient appears stable so that the remainder of the MSE may be completed by another provider.  Presents from nursing home with altered mental status and shortness of breath.  Concern for possible sepsis.  Work-up initiated here.     Lacretia Leigh, MD 04/30/22 2055

## 2022-04-30 NOTE — ED Provider Notes (Addendum)
Physical Exam  BP (!) 92/47   Pulse 66   Temp (!) 97 F (36.1 C) (Oral)   Resp (!) 22   Ht '5\' 3"'$  (1.6 m)   Wt 65.9 kg   SpO2 100%   BMI 25.72 kg/m   Physical Exam Vitals and nursing note reviewed.  Constitutional:      General: She is not in acute distress.    Appearance: She is well-developed. She is ill-appearing.  HENT:     Head: Normocephalic and atraumatic.     Mouth/Throat:     Mouth: Mucous membranes are dry.  Eyes:     Pupils: Pupils are equal, round, and reactive to light.  Cardiovascular:     Rate and Rhythm: Normal rate and regular rhythm.     Heart sounds: No murmur heard. Pulmonary:     Effort: Pulmonary effort is normal. No respiratory distress.     Breath sounds: Examination of the right-lower field reveals rales. Examination of the left-lower field reveals rales. Rales present.  Abdominal:     General: Abdomen is flat.     Palpations: Abdomen is soft.     Tenderness: There is no abdominal tenderness.  Musculoskeletal:        General: No tenderness.     Right lower leg: No edema.     Left lower leg: No edema.  Skin:    General: Skin is warm and dry.  Neurological:     General: No focal deficit present.     Mental Status: She is alert. Mental status is at baseline.  Psychiatric:        Mood and Affect: Mood normal.        Behavior: Behavior normal.     Procedures  .Critical Care  Performed by: Cristie Hem, MD Authorized by: Cristie Hem, MD   Critical care provider statement:    Critical care time (minutes):  30   Critical care was necessary to treat or prevent imminent or life-threatening deterioration of the following conditions:  Sepsis, respiratory failure, shock and circulatory failure   Critical care was time spent personally by me on the following activities:  Development of treatment plan with patient or surrogate, discussions with consultants, evaluation of patient's response to treatment, examination of patient, ordering  and review of laboratory studies, ordering and review of radiographic studies, ordering and performing treatments and interventions, pulse oximetry, re-evaluation of patient's condition and review of old charts   Care discussed with: admitting provider     ED Course / MDM   Clinical Course as of 04/30/22 2354  Fri Apr 30, 2022  2313 Work-up so far showing leukocytosis.  COVID-negative.  CT head unremarkable.  Chest x-ray showing bibasilar infiltrates.  Suspect likely pneumonia.  Will add urinalysis.  Patient started on antibiotics for possible healthcare associated pneumonia given recent hospitalization. [WS]  2335 Blood pressure did improve somewhat with IV fluids.  Will give small additional bolus.  Patient mentating well, do not think patient will need ICU, will talk with hospitalist for admission. [WS]    Clinical Course User Index [WS] Cristie Hem, MD   Medical Decision Making Amount and/or Complexity of Data Reviewed Independent Historian: guardian External Data Reviewed: labs, radiology, ECG and notes. Labs: ordered. Radiology: ordered and independent interpretation performed.    Details: Pneumonia   Risk Prescription drug management. Decision regarding hospitalization.          Cristie Hem, MD 04/30/22 2337    Cristie Hem,  MD 04/30/22 2354

## 2022-04-30 NOTE — ED Provider Notes (Signed)
[facility] EMERGENCY DEPARTMENT Provider Note   CSN: [id number] Arrival date & time: 04/30/22  2034     History  Chief Complaint  Patient presents with   Shortness of Breath        Altered Mental Status    Stacey Davenport is a 81 y.o. female.  81 year old female presents from nursing home with altered mental status and shortness of breath.  Patient was found to be hypoxic and required supplemental oxygen.  Patient herself notes some dizziness with some nausea.  Initially hypotensive but did respond well to fluids.  Treated for bronchospasm and sent here.  History is limited due to her altered state       Home Medications Prior to Admission medications   Medication Sig Start Date End Date Taking? Authorizing Provider  acetaminophen (TYLENOL) 650 MG CR tablet Take 650 mg by mouth every 8 (eight) hours. May also take 1 tablet by mouth every 4 hours as needed for pain    [provider]  amiodarone (PACERONE) 200 MG tablet Take 1 tablet (200 mg total) by mouth daily. 03/07/13   Minus Breeding, MD  bisacodyl (DULCOLAX) 10 MG suppository Place 1 suppository (10 mg total) rectally daily as needed for moderate constipation. 04/02/22   Ghimire, Henreitta Leber, MD  brimonidine (ALPHAGAN P) 0.1 % SOLN Place 1 drop into the left eye in the morning, at noon, and at bedtime.    [provider]  budesonide-formoterol (SYMBICORT) 160-4.5 MCG/ACT inhaler Inhale 2 puffs into the lungs 2 (two) times daily.    [provider]  Cholecalciferol (VITAMIN D) 50 MCG (2000 UT) CAPS Take 2,000 Units by mouth daily.    [provider]  CRANBERRY PO Take 425 mg by mouth daily as needed (bladder irritation).    [provider]  cyanocobalamin 1000 MCG tablet Take 1,000 mcg by mouth daily.    [provider]  Ensure (ENSURE) Take 237 mLs by mouth 2 (two) times daily between meals.    [provider]  ferrous sulfate 325 (65 FE) MG  tablet Take 325 mg by mouth daily with breakfast.    [provider]  furosemide (LASIX) 40 MG tablet Take 1 tablet (40 mg total) by mouth daily. 04/03/22   Ghimire, Henreitta Leber, MD  guaiFENesin (MUCINEX) 600 MG 12 hr tablet Take 2 tablets (1,200 mg total) by mouth 2 (two) times daily. 04/02/22   Ghimire, Henreitta Leber, MD  hydrocortisone cream 1 % Apply 1 Application topically 2 (two) times daily. Apply 1 application to both hands twice a day until healed    [provider]  latanoprost (XALATAN) 0.005 % ophthalmic solution PLACE 1 DROP IN [facility] EYE AT BEDTIME Patient taking differently: Place 1 drop into both eyes at bedtime. 06/11/15   Chipper Herb, MD  levalbuterol [facility] HFA) 45 MCG/ACT inhaler Inhale 2 puffs into the lungs 3 (three) times daily. 10/24/21   Johnson, Clanford L, MD  levalbuterol (XOPENEX) 0.63 MG/3ML nebulizer solution Take 3 mLs (0.63 mg total) by nebulization every 6 (six) hours as needed for wheezing or shortness of breath. 04/02/22   Ghimire, Henreitta Leber, MD  levothyroxine (SYNTHROID, LEVOTHROID) 75 MCG tablet Take 75 mcg by mouth daily before breakfast. Take once daily 12/06/12   Chipper Herb, MD  loratadine (CLARITIN) 10 MG tablet Take 10 mg by mouth daily.    [provider]  LORazepam (ATIVAN) 0.5 MG tablet Take 1 tablet (0.5 mg total) by mouth  at bedtime. May also give 1 tablet by mouth twice a day as needed for anxiety 04/02/22   Ghimire, Henreitta Leber, MD  Menthol, Topical Analgesic, (BIOFREEZE PROFESSIONAL EX) Apply 1 application. topically every 4 (four) hours as needed (joint pain).    [provider]  menthol-cetylpyridinium (CEPACOL) 3 MG lozenge Take 1 lozenge (3 mg total) by mouth as needed for sore throat. 04/02/22   Ghimire, Henreitta Leber, MD  Olopatadine HCl 0.2 % SOLN Place 1 drop into both eyes daily.    [provider]  ondansetron (ZOFRAN) 4 MG tablet Take 1 tablet (4 mg total) by mouth daily as needed for nausea or vomiting. 04/02/22  04/02/23  Ghimire, Henreitta Leber, MD  Ophthalmic Irrigation Solution (OCUSOFT EYE [place] OP) Apply 1 application. to eye daily. Applied topically to eyelids once daily with warm water    [provider]  pantoprazole (PROTONIX) 40 MG tablet Take 1 tablet (40 mg total) by mouth 2 (two) times daily. Patient taking differently: Take 40 mg by mouth daily. 10/24/21 03/25/23  Murlean Iba, MD  Polyethyl Glycol-Propyl Glycol (GENTEAL TEARS SEVERE DAY/NIGHT) 0.4-0.3 % GEL ophthalmic gel Place 1 Application into both eyes at bedtime.    [provider]  polyethylene glycol (MIRALAX / GLYCOLAX) 17 g packet Take 17 g by mouth daily.    [provider]  polyvinyl alcohol (LIQUIFILM TEARS) 1.4 % ophthalmic solution Place 1 drop into both eyes 4 (four) times daily as needed for dry eyes.    [provider]  predniSONE (DELTASONE) 5 MG tablet Take 40 mg p.o. daily for 2 days, then 30 mg p.o. daily for 2 days, then 20 mg p.o. daily for 2 days, then 10 mg p.o. daily for 2 days, and then resume usual dosing of 5 mg daily. 04/02/22   Ghimire, Henreitta Leber, MD  rivaroxaban (XARELTO) 20 MG TABS tablet Take 20 mg by mouth at bedtime.    [provider]  Saccharomyces boulardii (PROBIOTIC) 250 MG CAPS Take 250 mg by mouth daily. For IBS    [provider]  senna-docusate (SENOKOT-S) 8.6-50 MG tablet Take 2 tablets by mouth at bedtime. 04/02/22   Ghimire, Henreitta Leber, MD  sodium fluoride (PREVIDENT 5000 PLUS) 1.1 % CREA dental cream Place 1 Application onto teeth at bedtime.    [provider]  traMADol (ULTRAM) 50 MG tablet Take 0.5 tablets (25 mg total) by mouth 2 (two) times daily as needed for moderate pain (back pain). 04/02/22   Ghimire, Henreitta Leber, MD  potassium chloride (K-DUR,KLOR-CON) 10 MEQ tablet Take 1 tablet (10 mEq total) by mouth daily. 12/06/12 10/11/13  Chipper Herb, MD      Allergies    Sulfamethoxazole, Aciphex Graciella [place] sodium], Biaxin  [clarithromycin], Hydrocodone, Ketek [telithromycin], Latex, Levaquin [levofloxacin in d5w], Lipitor [atorvastatin], Macrobid [nitrofurantoin macrocrystal], Moxifloxacin, Nexium [esomeprazole magnesium], Sulfonamide derivatives, Vesicare [solifenacin], Zithromax [azithromycin], and Cephalexin    Review of Systems   Review of Systems  Unable to perform ROS: Mental status change    Physical Exam Updated Vital Signs Temp (!) 97 F (36.1 C) (Oral)   Ht 1.6 m ('5\' 3"'$ )   Wt 65.9 kg   BMI 25.72 kg/m  Physical Exam Vitals and nursing note reviewed.  Constitutional:      General: She is not in acute distress.    Appearance: Normal appearance. She is well-developed. She is not toxic-appearing.  HENT:     Head: Normocephalic and atraumatic.  Eyes:  General: Lids are normal.     Conjunctiva/sclera: Conjunctivae normal.     Pupils: Pupils are equal, round, and reactive to light.  Neck:     Thyroid: No thyroid mass.     Trachea: No tracheal deviation.  Cardiovascular:     Rate and Rhythm: Normal rate and regular rhythm.     Heart sounds: Normal heart sounds. No murmur heard.    No gallop.  Pulmonary:     Effort: Pulmonary effort is normal. No respiratory distress.     Breath sounds: Normal breath sounds. No stridor. No decreased breath sounds, wheezing, rhonchi or rales.  Abdominal:     General: There is no distension.     Palpations: Abdomen is soft.     Tenderness: There is no abdominal tenderness. There is no rebound.  Musculoskeletal:        General: No tenderness. Normal range of motion.     Cervical back: Normal range of motion and neck supple.  Skin:    General: Skin is warm and dry.     Findings: No abrasion or rash.  Neurological:     Mental Status: She is lethargic and disoriented.     GCS: GCS eye subscore is 4. GCS verbal subscore is 4. GCS motor subscore is 5.     Cranial Nerves: No cranial nerve deficit.     Sensory: No sensory deficit.     Motor: No tremor.   Psychiatric:        Attention and Perception: She is inattentive.        Speech: Speech is delayed.     ED Results / Procedures / Treatments   Labs (all labs ordered are listed, but only abnormal results are displayed) Labs Reviewed  CULTURE, BLOOD (ROUTINE X 2)  CULTURE, BLOOD (ROUTINE X 2)  SARS CORONAVIRUS 2 BY RT PCR  CBC WITH DIFFERENTIAL/PLATELET  BASIC METABOLIC PANEL  LACTIC ACID, PLASMA  LACTIC ACID, PLASMA    EKG EKG Interpretation  Date/Time:  Friday April 30 2022 20:48:48 EDT Ventricular Rate:  82 PR Interval:  183 QRS Duration: 102 QT Interval:  477 QTC Calculation: 558 R Axis:   260 Text Interpretation: Sinus rhythm Probable left atrial enlargement Left anterior fascicular block Low voltage, precordial leads Probable anteroseptal infarct, old Prolonged QT interval Confirmed by Lacretia Leigh ([id number]) on 04/30/2022 8:54:23 PM  Radiology No results found.  Procedures Procedures    Medications Ordered in ED Medications  0.9 %  sodium chloride infusion (has no administration in time range)    ED Course/ Medical Decision Making/ A&P                           Medical Decision Making Amount and/or Complexity of Data Reviewed Labs: ordered. Radiology: ordered.  Risk Prescription drug management.   Patient's work-up is pending at this time.  We will sign off to next provider        Final Clinical Impression(s) / ED Diagnoses Final diagnoses:  None    Rx / DC Orders ED Discharge Orders     None         Lacretia Leigh, MD 04/30/22 2103

## 2022-04-30 NOTE — Progress Notes (Signed)
Pharmacy Antibiotic Note  Stacey Davenport is a 81 y.o. female admitted on 04/30/2022 with sepsis.  Pharmacy has been consulted for Zosyn dosing.  Plan: Zosyn 3.375g IV q8h (4 hour infusion).  Height: '5\' 3"'$  (160 cm) Weight: 65.9 kg (145 lb 3.2 oz) IBW/kg (Calculated) : 52.4  Temp (24hrs), Avg:97 F (36.1 C), Min:97 F (36.1 C), Max:97 F (36.1 C)  Recent Labs  Lab 04/30/22 2125  WBC 20.1*  CREATININE 0.82  LATICACIDVEN 1.5    Estimated Creatinine Clearance: 48.3 mL/min (by C-G formula based on SCr of 0.82 mg/dL).    Allergies  Allergen Reactions   Sulfamethoxazole Nausea And Vomiting    Bladder infection NOT ON MAR   Aciphex [Rabeprazole Sodium]     Unknown reaction   Biaxin [Clarithromycin] Hives and Itching   Hydrocodone     NOT ON MAR    Ketek [Telithromycin]     NOT ON MAR   Latex     Unknown reaction    Levaquin [Levofloxacin In D5w]     Unknown reaction    Lipitor [Atorvastatin]     Unknown reaction    Macrobid [Nitrofurantoin Macrocrystal]     NOT ON MAR   Moxifloxacin     NOT ON MAR   Nexium [Esomeprazole Magnesium]    Sulfonamide Derivatives     NOT ON MAR    Vesicare [Solifenacin]     NOT ON MAR   Zithromax [Azithromycin]     NOT ON MAR    Cephalexin Itching and Rash    Thank you for allowing pharmacy to be a part of this patient's care.  Wynona Neat, PharmD, BCPS  04/30/2022 11:47 PM

## 2022-04-30 NOTE — ED Triage Notes (Signed)
Pt bib [facility] from [facility] for ams and shob. Pt noted to be less alert at approx 6pm and staff noticed her spo2 at 85% on her 3L normal o2. Neb treatment given and pt spo2 improved to 94% but pt stated she was having dizziness and nausea. BP originally 93/50 - 252m normal saline given. '125mg'$  solumedrol given PTA.  HR 80, BP 102/46, RR 32, Spo2 97% 3L

## 2022-04-30 NOTE — H&P (Signed)
History and Physical   Stacey Davenport:[id number] DOB: September 04, 1940 DOA: 04/30/2022  PCP: Janifer Adie, MD   Patient coming from: Skilled nursing facility  Chief Complaint: Altered mentation and shortness of breath/hypoxia  HPI: Stacey Davenport is a 81 y.o. female with medical history significant of asthma, hypertension, GERD, atrial fibrillation, arthritis, glaucoma, hyperlipidemia, history of CVA, hypothyroidism, depression, diastolic CHF presenting shortness of breath and altered mental status.  Patient was found to be hypoxic at her facility today and was requiring supplemental oxygen to maintain saturations.  She is also noted to be less aware than her baseline.  She has reported some dizziness and nausea.  Mentation appears to be improving in the ED and she denies fevers, chills, chest pain, abdominal pain, constipation, diarrhea.  ED Course: Vital signs in ED significant for blood pressure in the 26J to 33L systolic with maps in the 60s.  Has required to 3 L of oxygen. Lab work-up included CMP with potassium 2.9, calcium 8.3, protein stable at 5.5 and albumin stable 2.8.  CBC with leukocytosis to 20.1.  PT and INR stably elevated at 16.7 and 1.4 respectively.  Lactic acid normal at 1.5, repeat pending.  Lipase level normal.  BNP borderline at 179.  COVID screen negative.  Blood cultures pending.  Urinalysis pending.  VBG with pH of 7.436 and PCO2 of 34.7.  Chest x-ray showed stable cardiomegaly with mild to moderate atelectasis versus infiltrate.  CT head showed no acute Hilda Blades.  Patient receiving mycin, Zosyn, 1.5 L of IV fluids, 60 mEq of p.o. potassium.  Review of Systems: As per HPI otherwise all other systems reviewed and are negative.  Past Medical History:  Diagnosis Date   Anxiety    Asthma    Atrial fibrillation (HCC)    CVA (cerebral infarction)    a. 09/2011 MRI tiny bilat centrum semiovale acute non hemorrhagic infarcts   Dementia (HCC)    mild per patient    Esophageal stricture    Gastric polyp    Hyperplastic   Gastritis    GERD (gastroesophageal reflux disease)    Glaucoma    Hiatal hernia    Hyperlipidemia    Hypertension    Hypothyroidism    Osteoarthritis    Pneumonia    Rheumatoid arthritis(714.0)    Thyroid disease     Past Surgical History:  Procedure Laterality Date   APPENDECTOMY     BIOPSY  10/22/2021   Procedure: BIOPSY;  Surgeon: Daneil Dolin, MD;  [facility]: [facility];  Service: Endoscopy;;  gastric biopsy for H. pylori   BLEPHAROPLASTY Right    CATARACT EXTRACTION Bilateral    CHOLECYSTECTOMY     ESOPHAGOGASTRODUODENOSCOPY (EGD) WITH PROPOFOL N/A 10/22/2021   Procedure: ESOPHAGOGASTRODUODENOSCOPY (EGD) WITH PROPOFOL;  Surgeon: Daneil Dolin, MD;  Location: [facility];  Service: Endoscopy;  Laterality: N/A;   HEMOSTASIS CLIP PLACEMENT  10/22/2021   Procedure: HEMOSTASIS CLIP PLACEMENT;  Surgeon: Daneil Dolin, MD;  Location: [facility];  Service: Endoscopy;;   POLYPECTOMY  10/22/2021   Procedure: POLYPECTOMY;  Surgeon: Daneil Dolin, MD;  Location: [facility];  Service: Endoscopy;;  gastric    TUBAL LIGATION      Social History  reports that she has never smoked. She has never used smokeless tobacco. She reports that she does not drink alcohol and does not use drugs.  Allergies  Allergen Reactions   Sulfamethoxazole Nausea And Vomiting    Bladder infection NOT ON MAR  Aciphex [Rabeprazole Sodium]     Unknown reaction   Biaxin [Clarithromycin] Hives and Itching   Hydrocodone     NOT ON MAR    Ketek [Telithromycin]     NOT ON MAR   Latex     Unknown reaction    Levaquin [Levofloxacin In D5w]     Unknown reaction    Lipitor [Atorvastatin]     Unknown reaction    Macrobid [Nitrofurantoin Macrocrystal]     NOT ON MAR   Moxifloxacin     NOT ON MAR   Nexium [Esomeprazole Magnesium]    Sulfonamide Derivatives     NOT ON MAR    Vesicare [Solifenacin]     NOT ON MAR   Zithromax  [Azithromycin]     NOT ON MAR    Cephalexin Itching and Rash    Family History  Problem Relation Age of Onset   Diabetes Mother    Cancer Father        lymph nodes???   Diabetes Sister    Heart disease Sister    Other Son        he was shot   Colon cancer Neg Hx    Esophageal cancer Neg Hx   Reviewed on admission  Prior to Admission medications   Medication Sig Start Date End Date Taking? Authorizing Provider  acetaminophen (TYLENOL) 650 MG CR tablet Take 650 mg by mouth every 8 (eight) hours. May also take 1 tablet by mouth every 4 hours as needed for pain    [provider]  amiodarone (PACERONE) 200 MG tablet Take 1 tablet (200 mg total) by mouth daily. 03/07/13   Minus Breeding, MD  bisacodyl (DULCOLAX) 10 MG suppository Place 1 suppository (10 mg total) rectally daily as needed for moderate constipation. 04/02/22   Ghimire, Henreitta Leber, MD  brimonidine (ALPHAGAN P) 0.1 % SOLN Place 1 drop into the left eye in the morning, at noon, and at bedtime.    [provider]  budesonide-formoterol (SYMBICORT) 160-4.5 MCG/ACT inhaler Inhale 2 puffs into the lungs 2 (two) times daily.    [provider]  Cholecalciferol (VITAMIN D) 50 MCG (2000 UT) CAPS Take 2,000 Units by mouth daily.    [provider]  CRANBERRY PO Take 425 mg by mouth daily as needed (bladder irritation).    [provider]  cyanocobalamin 1000 MCG tablet Take 1,000 mcg by mouth daily.    [provider]  Ensure (ENSURE) Take 237 mLs by mouth 2 (two) times daily between meals.    [provider]  ferrous sulfate 325 (65 FE) MG tablet Take 325 mg by mouth daily with breakfast.    [provider]  furosemide (LASIX) 40 MG tablet Take 1 tablet (40 mg total) by mouth daily. 04/03/22   Ghimire, Henreitta Leber, MD  guaiFENesin (MUCINEX) 600 MG 12 hr tablet Take 2 tablets (1,200 mg total) by mouth 2 (two) times daily. 04/02/22   Ghimire, Henreitta Leber, MD  hydrocortisone  cream 1 % Apply 1 Application topically 2 (two) times daily. Apply 1 application to both hands twice a day until healed    [provider]  latanoprost (XALATAN) 0.005 % ophthalmic solution PLACE 1 DROP IN [facility] EYE AT BEDTIME Patient taking differently: Place 1 drop into both eyes at bedtime. 06/11/15   Chipper Herb, MD  levalbuterol [facility] HFA) 45 MCG/ACT inhaler Inhale 2 puffs into the lungs 3 (three) times daily. 10/24/21   Murlean Iba, MD  levalbuterol (XOPENEX) 0.63 MG/3ML nebulizer solution Take 3 mLs (0.63 mg total) by nebulization every 6 (six) hours as needed for wheezing or shortness of breath. 04/02/22   Ghimire, Henreitta Leber, MD  levothyroxine (SYNTHROID, LEVOTHROID) 75 MCG tablet Take 75 mcg by mouth daily before breakfast. Take once daily 12/06/12   Chipper Herb, MD  loratadine (CLARITIN) 10 MG tablet Take 10 mg by mouth daily.    [provider]  LORazepam (ATIVAN) 0.5 MG tablet Take 1 tablet (0.5 mg total) by mouth at bedtime. May also give 1 tablet by mouth twice a day as needed for anxiety 04/02/22   Ghimire, Henreitta Leber, MD  Menthol, Topical Analgesic, (BIOFREEZE PROFESSIONAL EX) Apply 1 application. topically every 4 (four) hours as needed (joint pain).    [provider]  menthol-cetylpyridinium (CEPACOL) 3 MG lozenge Take 1 lozenge (3 mg total) by mouth as needed for sore throat. 04/02/22   Ghimire, Henreitta Leber, MD  Olopatadine HCl 0.2 % SOLN Place 1 drop into both eyes daily.    [provider]  ondansetron (ZOFRAN) 4 MG tablet Take 1 tablet (4 mg total) by mouth daily as needed for nausea or vomiting. 04/02/22 04/02/23  Ghimire, Henreitta Leber, MD  Ophthalmic Irrigation Solution (OCUSOFT EYE [place] OP) Apply 1 application. to eye daily. Applied topically to eyelids once daily with warm water    [provider]  pantoprazole (PROTONIX) 40 MG tablet Take 1 tablet (40 mg total) by mouth 2 (two) times daily. Patient taking differently: Take 40 mg by  mouth daily. 10/24/21 03/25/23  Murlean Iba, MD  Polyethyl Glycol-Propyl Glycol (GENTEAL TEARS SEVERE DAY/NIGHT) 0.4-0.3 % GEL ophthalmic gel Place 1 Application into both eyes at bedtime.    [provider]  polyethylene glycol (MIRALAX / GLYCOLAX) 17 g packet Take 17 g by mouth daily.    [provider]  polyvinyl alcohol (LIQUIFILM TEARS) 1.4 % ophthalmic solution Place 1 drop into both eyes 4 (four) times daily as needed for dry eyes.    [provider]  predniSONE (DELTASONE) 5 MG tablet Take 40 mg p.o. daily for 2 days, then 30 mg p.o. daily for 2 days, then 20 mg p.o. daily for 2 days, then 10 mg p.o. daily for 2 days, and then resume usual dosing of 5 mg daily. 04/02/22   Ghimire, Henreitta Leber, MD  rivaroxaban (XARELTO) 20 MG TABS tablet Take 20 mg by mouth at bedtime.    [provider]  Saccharomyces boulardii (PROBIOTIC) 250 MG CAPS Take 250 mg by mouth daily. For IBS    [provider]  senna-docusate (SENOKOT-S) 8.6-50 MG tablet Take 2 tablets by mouth at bedtime. 04/02/22   Ghimire, Henreitta Leber, MD  sodium fluoride (PREVIDENT 5000 PLUS) 1.1 % CREA dental cream Place 1 Application onto teeth at bedtime.    [provider]  traMADol (ULTRAM) 50 MG tablet Take 0.5 tablets (25 mg total) by mouth 2 (two) times daily as needed for moderate pain (back pain). 04/02/22   Ghimire, Henreitta Leber, MD  potassium chloride (K-DUR,KLOR-CON) 10 MEQ tablet Take 1 tablet (10 mEq total) by mouth daily. 12/06/12 10/11/13  Chipper Herb, MD    Physical Exam: Vitals:   04/30/22 2254 04/30/22 2255 04/30/22 2311 04/30/22 2315  BP: (!) 99/49 (!) 100/47 (!) 97/55 (!) 92/47  Pulse: 64 64 63 66  Resp: 18 (!) 21 18 (!) 22  Temp:      TempSrc:  SpO2: 100% 100% 100% 100%  Weight:      Height:        Physical Exam Constitutional:      General: She is not in acute distress.    Appearance: Normal appearance.  HENT:     Head: Normocephalic and atraumatic.      Mouth/Throat:     Mouth: Mucous membranes are moist.     Pharynx: Oropharynx is clear.  Eyes:     Extraocular Movements: Extraocular movements intact.     Pupils: Pupils are equal, round, and reactive to light.  Cardiovascular:     Rate and Rhythm: Normal rate and regular rhythm.     Pulses: Normal pulses.     Heart sounds: Murmur heard.  Pulmonary:     Effort: Pulmonary effort is normal. No respiratory distress.     Breath sounds: Normal breath sounds.  Abdominal:     General: Bowel sounds are normal. There is no distension.     Palpations: Abdomen is soft.     Tenderness: There is no abdominal tenderness.  Musculoskeletal:        General: No swelling or deformity.  Skin:    General: Skin is warm and dry.  Neurological:     General: No focal deficit present.     Mental Status: Mental status is at baseline.    Labs on Admission: I have personally reviewed following labs and imaging studies  CBC: Recent Labs  Lab 04/30/22 2125 04/30/22 2244  WBC 20.1*  --   NEUTROABS 17.5*  --   HGB 13.9 12.6  HCT 42.5 37.0  MCV 96.2  --   PLT 231  --     Basic Metabolic Panel: Recent Labs  Lab 04/30/22 2125 04/30/22 2244  NA 139 139  K 2.9* 2.8*  CL 105  --   CO2 22  --   GLUCOSE 101*  --   BUN 10  --   CREATININE 0.82  --   CALCIUM 8.3*  --     GFR: Estimated Creatinine Clearance: 48.3 mL/min (by C-G formula based on SCr of 0.82 mg/dL).  Liver Function Tests: Recent Labs  Lab 04/30/22 2125  AST 23  ALT 13  ALKPHOS 54  BILITOT 0.8  PROT 5.5*  ALBUMIN 2.8*    Urine analysis:    Component Value Date/Time   COLORURINE AMBER (A) 10/25/2021 1737   APPEARANCEUR TURBID (A) 10/25/2021 1737   LABSPEC 1.021 10/25/2021 1737   PHURINE 7.0 10/25/2021 1737   GLUCOSEU NEGATIVE 10/25/2021 1737   HGBUR SMALL (A) 10/25/2021 1737   BILIRUBINUR NEGATIVE 10/25/2021 1737   KETONESUR NEGATIVE 10/25/2021 1737   PROTEINUR 100 (A) 10/25/2021 1737   UROBILINOGEN 0.2 04/20/2012  1605   NITRITE NEGATIVE 10/25/2021 1737   LEUKOCYTESUR LARGE (A) 10/25/2021 1737    Radiological Exams on Admission: CT Head Wo Contrast  Result Date: 04/30/2022 CLINICAL DATA:  Altered mental status. EXAM: CT HEAD WITHOUT CONTRAST TECHNIQUE: Contiguous axial images were obtained from the base of the skull through the vertex without intravenous contrast. RADIATION DOSE REDUCTION: This exam was performed according to the departmental dose-optimization program which includes automated exposure control, adjustment of the mA and/or kV according to patient size and/or use of iterative reconstruction technique. COMPARISON:  November 13, 2012 FINDINGS: Brain: There is mild cerebral atrophy with widening of the extra-axial spaces and ventricular dilatation. There are areas of decreased attenuation within the white matter tracts of the supratentorial brain, consistent with microvascular disease changes. Small,  chronic bilateral basal ganglia lacunar infarcts are noted. Vascular: No hyperdense vessel or unexpected calcification. Skull: Normal. Negative for fracture or focal lesion. Sinuses/Orbits: No acute finding. Other: None. IMPRESSION: 1. No acute intracranial abnormality. 2. Generalized cerebral atrophy and microvascular disease changes of the supratentorial brain. 3. Small, chronic bilateral basal ganglia lacunar infarcts. Electronically Signed   By: Virgina Norfolk M.D.   On: 04/30/2022 22:10   DG Chest Port 1 View  Result Date: 04/30/2022 CLINICAL DATA:  Shortness of breath and altered mental status. EXAM: PORTABLE CHEST 1 VIEW COMPARISON:  March 29, 2022 FINDINGS: The cardiac silhouette is mildly enlarged and unchanged in size. Mild, diffuse, chronic appearing increased interstitial lung markings are noted. Mild to moderate severity areas of atelectasis and/or infiltrate are seen within the bilateral lung bases. There is a small left pleural effusion. No pneumothorax is identified. Multilevel  degenerative changes seen throughout the thoracic spine. IMPRESSION: 1. Stable cardiomegaly with mild to moderate severity bibasilar atelectasis and/or infiltrate. 2. Small left pleural effusion. Electronically Signed   By: Virgina Norfolk M.D.   On: 04/30/2022 21:30    EKG: Independently reviewed.  Sinus rhythm at 80 beats minute.  Nonspecific T wave flattening.  QTc prolonged at 558.  Assessment/Plan Principal Problem:   Severe sepsis (HCC) Active Problems:   Hypertension   Asthma, chronic, steroid-dependent   GERD   Rheumatoid arthritis (HCC)   Atrial fibrillation (HCC)   Hyperlipidemia   History of CVA (cerebrovascular accident)   Hypothyroidism   Depression   Chronic diastolic CHF (congestive heart failure) ([place])   Acute cephalopathy Severe sepsis ?Pneumonia > Patient presenting with altered mental status and shortness of breath/hypoxia facility.  Encephalopathy likely secondary to developing sepsis. > On arrival found to have hypotension with blood pressure in the 15V to 90 systolic and maps in the 76H has remained stable with 1.5 L of fluid.  Initially does appear dry so likely some component of hypovolemia. > Lab work-up showed leukocytosis to 20.1 and chest x-ray with evidence of possible pneumonia.  This combined with hypotension and altered mental status meets arteria for severe sepsis. > Patient received vancomycin Zosyn in the ED, has allergies and has tolerated these in the past. - Monitor on progressive unit - Give additional 500 cc fluid bolus, then 75 cc/hr - Continue vancomycin and Zosyn for now - Trend lactic acid - Trend fever curve and WBC - Supple oxygen, wean as tolerated - A.m. cortisol - Procalcitonin - Follow-up urinalysis and blood cultures  Atrial fibrillation - Continue home amiodarone and Xarelto  Asthma - Continue home prednisone - Replacement Symbicort formulary Dulera - Continue as needed Xopenex  Hypertension - Holding home Lasix in the  setting of hypotension as above  GERD - Continue PPI  Rheumatoid arthritis - Continue home steroid as above  Glaucoma - Continue home eyedrops  Hyperlipidemia History of CVA - Not currently prescribed any antilipidemics  Hypothyroidism - Continue home Synthroid  Diastolic CHF > Noted to have grade 2 diastolic dysfunction with normal EF and normal RV function on recent echo earlier this year.  Had been started on Lasix.  Appeared dry on initial presentation and has hypertension.  BNP indeterminate at 179. - Holding home Lasix as above - IVF as above  DVT prophylaxis: Xarelto Code Status:   Full Family Communication:  Updated at Bedside  Disposition Plan:   Patient is from:  Skilled nursing facility  Anticipated DC to:  Same as above  Anticipated DC date:  2 to 5 days  Anticipated DC barriers: None  Consults called:  None Admission status:  Inpatient, progressive  Severity of Illness: The appropriate patient status for this patient is INPATIENT. Inpatient status is judged to be reasonable and necessary in order to provide the required intensity of service to ensure the patient's safety. The patient's presenting symptoms, physical exam findings, and initial radiographic and laboratory data in the context of their chronic comorbidities is felt to place them at high risk for further clinical deterioration. Furthermore, it is not anticipated that the patient will be medically stable for discharge from the hospital within 2 midnights of admission.   * I certify that at the point of admission it is my clinical judgment that the patient will require inpatient hospital care spanning beyond 2 midnights from the point of admission due to high intensity of service, high risk for further deterioration and high frequency of surveillance required.Marcelyn Bruins MD [facility]  How to contact the [facility] Attending or Consulting provider [place] or covering provider during after  hours [place], for this patient?   Check the care team in [facility] and look for a) attending/consulting [facility] provider listed and b) the [facility] team listed Log into [email or web address] and use [place]'s universal password to access. If you do not have the password, please contact the hospital operator. Locate the [facility] provider you are looking for under [facility] and page to a number that you can be directly reached. If you still have difficulty reaching the provider, please page the [facility] (Director on Call) for the Hospitalists listed on amion for assistance.  04/30/2022, 11:44 PM

## 2022-05-01 DIAGNOSIS — R6520 Severe sepsis without septic shock: Secondary | ICD-10-CM | POA: Diagnosis not present

## 2022-05-01 DIAGNOSIS — A419 Sepsis, unspecified organism: Secondary | ICD-10-CM | POA: Diagnosis not present

## 2022-05-01 LAB — BLOOD CULTURE ID PANEL (REFLEXED) - BCID2
A.calcoaceticus-baumannii: NOT DETECTED
Bacteroides fragilis: NOT DETECTED
CTX-M ESBL: DETECTED — AB
Candida albicans: NOT DETECTED
Candida auris: NOT DETECTED
Candida glabrata: NOT DETECTED
Candida krusei: NOT DETECTED
Candida parapsilosis: NOT DETECTED
Candida tropicalis: NOT DETECTED
Carbapenem resist OXA 48 LIKE: NOT DETECTED
Carbapenem resistance IMP: NOT DETECTED
Carbapenem resistance KPC: NOT DETECTED
Carbapenem resistance NDM: NOT DETECTED
Carbapenem resistance VIM: NOT DETECTED
Cryptococcus neoformans/gattii: NOT DETECTED
Enterobacter cloacae complex: NOT DETECTED
Enterococcus Faecium: NOT DETECTED
Enterococcus faecalis: NOT DETECTED
Escherichia coli: DETECTED — AB
Haemophilus influenzae: NOT DETECTED
Klebsiella aerogenes: NOT DETECTED
Klebsiella oxytoca: NOT DETECTED
Klebsiella pneumoniae: NOT DETECTED
Listeria monocytogenes: NOT DETECTED
Neisseria meningitidis: NOT DETECTED
Proteus species: NOT DETECTED
Pseudomonas aeruginosa: NOT DETECTED
Salmonella species: NOT DETECTED
Serratia marcescens: NOT DETECTED
Staphylococcus aureus (BCID): NOT DETECTED
Staphylococcus epidermidis: NOT DETECTED
Staphylococcus lugdunensis: NOT DETECTED
Staphylococcus species: NOT DETECTED
Stenotrophomonas maltophilia: NOT DETECTED
Streptococcus agalactiae: NOT DETECTED
Streptococcus pneumoniae: NOT DETECTED
Streptococcus pyogenes: NOT DETECTED
Streptococcus species: NOT DETECTED

## 2022-05-01 LAB — COMPREHENSIVE METABOLIC PANEL
ALT: 10 U/L (ref 0–44)
AST: 20 U/L (ref 15–41)
Albumin: 2 g/dL — ABNORMAL LOW (ref 3.5–5.0)
Alkaline Phosphatase: 37 U/L — ABNORMAL LOW (ref 38–126)
Anion gap: 5 (ref 5–15)
BUN: 9 mg/dL (ref 8–23)
CO2: 19 mmol/L — ABNORMAL LOW (ref 22–32)
Calcium: 6.4 mg/dL — CL (ref 8.9–10.3)
Chloride: 117 mmol/L — ABNORMAL HIGH (ref 98–111)
Creatinine, Ser: 0.67 mg/dL (ref 0.44–1.00)
GFR, Estimated: >60 mL/min (ref >60)
Glucose, Bld: 99 mg/dL (ref 70–99)
Potassium: 3.1 mmol/L — ABNORMAL LOW (ref 3.5–5.1)
Sodium: 141 mmol/L (ref 135–145)
Total Bilirubin: 0.7 mg/dL (ref 0.3–1.2)
Total Protein: 3.9 g/dL — ABNORMAL LOW (ref 6.5–8.1)

## 2022-05-01 LAB — PROCALCITONIN: Procalcitonin: 12.54 ng/mL

## 2022-05-01 LAB — MRSA NEXT GEN BY PCR, NASAL: MRSA by PCR Next Gen: NOT DETECTED

## 2022-05-01 LAB — LACTIC ACID, PLASMA: Lactic Acid, Venous: 1.7 mmol/L (ref 0.5–1.9)

## 2022-05-01 LAB — CBC
HCT: 37.2 % (ref 36.0–46.0)
Hemoglobin: 12.1 g/dL (ref 12.0–15.0)
MCH: 31.7 pg (ref 26.0–34.0)
MCHC: 32.5 g/dL (ref 30.0–36.0)
MCV: 97.4 fL (ref 80.0–100.0)
Platelets: 205 10*3/uL (ref 150–400)
RBC: 3.82 MIL/uL — ABNORMAL LOW (ref 3.87–5.11)
RDW: 15 % (ref 11.5–15.5)
WBC: 25.2 10*3/uL — ABNORMAL HIGH (ref 4.0–10.5)
nRBC: 0 % (ref 0.0–0.2)

## 2022-05-01 LAB — PROTIME-INR
INR: 1.5 — ABNORMAL HIGH (ref 0.8–1.2)
Prothrombin Time: 17.6 seconds — ABNORMAL HIGH (ref 11.4–15.2)

## 2022-05-01 LAB — CORTISOL-AM, BLOOD: Cortisol - AM: 9.4 ug/dL (ref 6.7–22.6)

## 2022-05-01 MED ORDER — SODIUM CHLORIDE 0.9 % IV BOLUS
1000.0000 mL | Freq: Once | INTRAVENOUS | Status: AC
  Administered 2022-05-01: 1000 mL via INTRAVENOUS

## 2022-05-01 MED ORDER — DICLOFENAC SODIUM 1 % EX GEL
2.0000 g | Freq: Four times a day (QID) | CUTANEOUS | Status: DC | PRN
  Filled 2022-05-01: qty 100

## 2022-05-01 MED ORDER — SODIUM CHLORIDE 0.9 % IV SOLN
1.0000 g | Freq: Two times a day (BID) | INTRAVENOUS | Status: DC
  Administered 2022-05-01 – 2022-05-03 (×5): 1 g via INTRAVENOUS
  Filled 2022-05-01 (×6): qty 20

## 2022-05-01 MED ORDER — MIDODRINE HCL 5 MG PO TABS
5.0000 mg | ORAL_TABLET | Freq: Three times a day (TID) | ORAL | Status: DC
  Administered 2022-05-01 – 2022-05-04 (×9): 5 mg via ORAL
  Filled 2022-05-01 (×9): qty 1

## 2022-05-01 MED ORDER — POTASSIUM CHLORIDE CRYS ER 20 MEQ PO TBCR
40.0000 meq | EXTENDED_RELEASE_TABLET | ORAL | Status: AC
  Administered 2022-05-01 (×2): 40 meq via ORAL
  Filled 2022-05-01 (×2): qty 2

## 2022-05-01 MED ORDER — SODIUM CHLORIDE 0.9 % IV SOLN: INTRAVENOUS | Status: DC

## 2022-05-01 NOTE — Progress Notes (Signed)
Critical lab value: Calcium - 6.4 A. Zebedee Iba, NP made aware. No new orders at this time.

## 2022-05-01 NOTE — Progress Notes (Signed)
pneumoniaPHARMACY - PHYSICIAN COMMUNICATION CRITICAL VALUE ALERT - BLOOD CULTURE IDENTIFICATION (BCID)  Stacey Davenport is an 81 y.o. female who presented to [facility] on 04/30/2022 with a chief complaint of pneumonia  Assessment:  4/4 blood cultures positive for e. Coli bearing the CTXM ESBL gene likely from HCAP  Name of physician (or Provider) Contacted: Adhikari  Current antibiotics: Vanc and Zosyn  Changes to prescribed antibiotics recommended:  D/c Zosyn and start Meropenem  Results for orders placed or performed during the hospital encounter of 04/30/22  Blood Culture ID Panel (Reflexed) (Collected: 04/30/2022  9:30 PM)  Result Value Ref Range   Enterococcus faecalis NOT DETECTED NOT DETECTED   Enterococcus Faecium NOT DETECTED NOT DETECTED   Listeria monocytogenes NOT DETECTED NOT DETECTED   Staphylococcus species NOT DETECTED NOT DETECTED   Staphylococcus aureus (BCID) NOT DETECTED NOT DETECTED   Staphylococcus epidermidis NOT DETECTED NOT DETECTED   Staphylococcus lugdunensis NOT DETECTED NOT DETECTED   Streptococcus species NOT DETECTED NOT DETECTED   Streptococcus agalactiae NOT DETECTED NOT DETECTED   Streptococcus pneumoniae NOT DETECTED NOT DETECTED   Streptococcus pyogenes NOT DETECTED NOT DETECTED   A.calcoaceticus-baumannii NOT DETECTED NOT DETECTED   Bacteroides fragilis NOT DETECTED NOT DETECTED   Enterobacterales PENDING NOT DETECTED   Enterobacter cloacae complex NOT DETECTED NOT DETECTED   Escherichia coli DETECTED (A) NOT DETECTED   Klebsiella aerogenes NOT DETECTED NOT DETECTED   Klebsiella oxytoca NOT DETECTED NOT DETECTED   Klebsiella pneumoniae NOT DETECTED NOT DETECTED   Proteus species NOT DETECTED NOT DETECTED   Salmonella species NOT DETECTED NOT DETECTED   Serratia marcescens NOT DETECTED NOT DETECTED   Haemophilus influenzae NOT DETECTED NOT DETECTED   Neisseria meningitidis NOT DETECTED NOT DETECTED   Pseudomonas aeruginosa NOT DETECTED NOT  DETECTED   Stenotrophomonas maltophilia NOT DETECTED NOT DETECTED   Candida albicans NOT DETECTED NOT DETECTED   Candida auris NOT DETECTED NOT DETECTED   Candida glabrata NOT DETECTED NOT DETECTED   Candida krusei NOT DETECTED NOT DETECTED   Candida parapsilosis NOT DETECTED NOT DETECTED   Candida tropicalis NOT DETECTED NOT DETECTED   Cryptococcus neoformans/gattii NOT DETECTED NOT DETECTED   CTX-M ESBL DETECTED (A) NOT DETECTED   Carbapenem resistance IMP NOT DETECTED NOT DETECTED   Carbapenem resistance KPC NOT DETECTED NOT DETECTED   Carbapenem resistance NDM NOT DETECTED NOT DETECTED   Carbapenem resist OXA 48 LIKE NOT DETECTED NOT DETECTED   Carbapenem resistance VIM NOT DETECTED NOT DETECTED    Alanda Slim, PharmD, FCCM Clinical Pharmacist Please see AMION for all Pharmacists' Contact Phone Numbers 05/01/2022, 12:14 PM

## 2022-05-01 NOTE — Progress Notes (Signed)
Patient arrived to the unit from the ED. Report received from ED nurse. Patient alert and oriented to herself. Unable to complete patient's admission profile due to patient's mental status. Patient oriented to the unit. Call light given to patient. Bed at its lowest position and bed rails x3 up. Will continue to monitor patient.

## 2022-05-01 NOTE — Plan of Care (Signed)
  Problem: Respiratory: Goal: Ability to maintain adequate ventilation will improve Outcome: Progressing   Problem: Education: Goal: Knowledge of General Education information will improve Description: Including pain rating scale, medication(s)/side effects and non-pharmacologic comfort measures Outcome: Progressing   Problem: Safety: Goal: Ability to remain free from injury will improve Outcome: Progressing   Problem: Skin Integrity: Goal: Risk for impaired skin integrity will decrease Outcome: Progressing

## 2022-05-01 NOTE — ED Notes (Signed)
ED TO INPATIENT HANDOFF REPORT  ED Nurse Name and Phone #: Devynne Sturdivant [phone]  S Name/Age/Gender Stacey Davenport 81 y.o. female Room/Bed: 012C/012C  Code Status   Code Status: DNR  Home/SNF/Other Skilled nursing facility Patient oriented to: self, place, and situation Is this baseline? No   Triage Complete: Triage complete  Chief Complaint Severe sepsis ([place]) [A41.9, R65.20]  Triage Note Pt bib [facility]ms from [facility] for ams and shob. Pt noted to be less alert at approx 6pm and staff noticed her spo2 at 85% on her 3L normal o2. Neb treatment given and pt spo2 improved to 94% but pt stated she was having dizziness and nausea. BP originally 93/50 - 251m normal saline given. '125mg'$  solumedrol given PTA.  HR 80, BP 102/46, RR 32, Spo2 97% 3L   Allergies Allergies  Allergen Reactions   Sulfamethoxazole Nausea And Vomiting    Bladder infection NOT ON MAR   Aciphex [Rabeprazole Sodium]     Unknown reaction   Biaxin [Clarithromycin] Hives and Itching   Hydrocodone     NOT ON MAR    Ketek [Telithromycin]     NOT ON MAR   Latex     Unknown reaction    Levaquin [Levofloxacin In D5w]     Unknown reaction    Lipitor [Atorvastatin]     Unknown reaction    Macrobid [Nitrofurantoin Macrocrystal]     NOT ON MAR   Moxifloxacin     NOT ON MAR   Nexium [Esomeprazole Magnesium]    Sulfonamide Derivatives     NOT ON MAR    Vesicare [Solifenacin]     NOT ON MAR   Zithromax [Azithromycin]     NOT ON MAR    Cephalexin Itching and Rash    Level of Care/Admitting Diagnosis ED Disposition     ED Disposition  Admit   Condition  --   Comment  [facility][id number]]  Level of Care: Progressive [102]  Admit to Progressive based on following criteria: MULTISYSTEM THREATS such as stable sepsis, metabolic/electrolyte imbalance with or without encephalopathy that is responding to early treatment.  [facility]  equivalent level of care is available:: No  Covid Evaluation: Confirmed COVID Negative  Diagnosis: Severe sepsis ([facility] [[[id number]] Admitting Physician: MMarcelyn Bruins[[[id number] Attending Physician: MMarcelyn Bruins[[[id number] Certification:: I certify this patient will need inpatient services for at least 2 midnights  Estimated Length of Stay: 3          B Medical/Surgery History Past Medical History:  Diagnosis Date   Anxiety    Asthma    [place]    CVA (cerebral infarction)    a. 09/2011 MRI tiny bilat centrum semiovale acute non hemorrhagic infarcts   Dementia ([facility]    mild per patient   Esophageal stricture    Gastric polyp    Hyperplastic   Gastritis    GERD (gastroesophageal reflux disease)    Glaucoma    Hiatal hernia    Hyperlipidemia    Hypertension    Hypothyroidism    Osteoarthritis    Pneumonia    Rheumatoid arthritis(714.0)    Thyroid disease    Past Surgical History:  Procedure Laterality Date   APPENDECTOMY     BIOPSY  10/22/2021   Procedure: BIOPSY;  Surgeon: RDaneil Dolin MD;  [facility];  Service: Endoscopy;;  gastric biopsy for H. pylori  BLEPHAROPLASTY Right    CATARACT EXTRACTION Bilateral    CHOLECYSTECTOMY     ESOPHAGOGASTRODUODENOSCOPY (EGD) WITH PROPOFOL N/A 10/22/2021   Procedure: ESOPHAGOGASTRODUODENOSCOPY (EGD) WITH PROPOFOL;  Surgeon: Daneil Dolin, MD;  [facility];  Service: Endoscopy;  Laterality: N/A;   HEMOSTASIS CLIP PLACEMENT  10/22/2021   Procedure: HEMOSTASIS CLIP PLACEMENT;  Surgeon: Daneil Dolin, MD;  [facility];  Service: Endoscopy;;   POLYPECTOMY  10/22/2021   Procedure: POLYPECTOMY;  Surgeon: Daneil Dolin, MD;  [facility];  Service: Endoscopy;;  gastric    TUBAL LIGATION       A IV Location/Drains/Wounds Patient Lines/Drains/Airways Status     Active Line/Drains/Airways     Name Placement date Placement time Site Days    Peripheral IV 04/30/22 18 G Anterior;Left Forearm 04/30/22  --  Forearm  1   Peripheral IV 04/30/22 20 G Anterior;Right Forearm 04/30/22  2115  Forearm  1   External Urinary Catheter 10/23/21  --  --  190            Intake/Output Last 24 hours  Intake/Output Summary (Last 24 hours) at 05/01/2022 0027 Last data filed at 05/01/2022 0013 Gross per 24 hour  Intake 1550 ml  Output --  Net 1550 ml    Labs/Imaging Results for orders placed or performed during the hospital encounter of 04/30/22 (from the past 48 hour(s))  SARS Coronavirus 2 by RT PCR (hospital order, performed in [facility] hospital lab) *cepheid single result test*     Status: None   Collection Time: 04/30/22  8:48 PM   Specimen: Nasal Swab  Result Value Ref Range   SARS Coronavirus 2 by RT PCR NEGATIVE NEGATIVE    Comment: (NOTE) SARS-CoV-2 target nucleic acids are NOT DETECTED.  The SARS-CoV-2 RNA is generally detectable in upper and lower respiratory specimens during the acute phase of infection. The lowest concentration of SARS-CoV-2 viral copies this assay can detect is 250 copies / mL. A negative result does not preclude SARS-CoV-2 infection and should not be used as the sole basis for treatment or other patient management decisions.  A negative result may occur with improper specimen collection / handling, submission of specimen other than nasopharyngeal swab, presence of viral mutation(s) within the areas targeted by this assay, and inadequate number of viral copies (<250 copies / mL). A negative result must be combined with clinical observations, patient history, and epidemiological information.  Fact Sheet for Patients:   https://www.patel.info/  Fact Sheet for Healthcare Providers: https://hall.com/  This test is not yet approved or  cleared by the [place] FDA and has been authorized for detection and/or diagnosis of SARS-CoV-2 by FDA under an  Emergency Use Authorization (EUA).  This EUA will remain in effect (meaning this test can be used) for the duration of the COVID-19 declaration under Section 564(b)(1) of the Act, 21 U.S.C. section 360bbb-3(b)(1), unless the authorization is terminated or revoked sooner.  Performed at [facility], [place] [address], [place], [place] [address]   CBC with Differential/Platelet     Status: Abnormal   Collection Time: 04/30/22  9:25 PM  Result Value Ref Range   WBC 20.1 (H) 4.0 - 10.5 K/uL   RBC 4.42 3.87 - 5.11 MIL/uL   Hemoglobin 13.9 12.0 - 15.0 g/dL   HCT 42.5 36.0 - 46.0 %   MCV 96.2 80.0 - 100.0 fL   MCH 31.4 26.0 - 34.0 pg   MCHC 32.7 30.0 -  36.0 g/dL   RDW 14.9 11.5 - 15.5 %   Platelets 231 150 - 400 K/uL   nRBC 0.0 0.0 - 0.2 %   Neutrophils Relative % 88 %   Neutro Abs 17.5 (H) 1.7 - 7.7 K/uL   Lymphocytes Relative 4 %   Lymphs Abs 0.9 0.7 - 4.0 K/uL   Monocytes Relative 7 %   Monocytes Absolute 1.3 (H) 0.1 - 1.0 K/uL   Eosinophils Relative 0 %   Eosinophils Absolute 0.0 0.0 - 0.5 K/uL   Basophils Relative 0 %   Basophils Absolute 0.1 0.0 - 0.1 K/uL   Immature Granulocytes 1 %   Abs Immature Granulocytes 0.25 (H) 0.00 - 0.07 K/uL    Comment: Performed at [facility] [address], [place], [place] [address]  Basic metabolic panel     Status: Abnormal   Collection Time: 04/30/22  9:25 PM  Result Value Ref Range   Sodium 139 135 - 145 mmol/L   Potassium 2.9 (L) 3.5 - 5.1 mmol/L   Chloride 105 98 - 111 mmol/L   CO2 22 22 - 32 mmol/L   Glucose, Bld 101 (H) 70 - 99 mg/dL    Comment: Glucose reference range applies only to samples taken after fasting for at least 8 hours.   BUN 10 8 - 23 mg/dL   Creatinine, Ser 0.82 0.44 - 1.00 mg/dL   Calcium 8.3 (L) 8.9 - 10.3 mg/dL   GFR, Estimated >60 >60 mL/min    Comment: (NOTE) Calculated using the CKD-EPI Creatinine Equation (2021)    Anion gap 12 5 - 15    Comment: Performed at [facility]  [address] [place] [address] [place], [place] [address]  Lactic acid, plasma     Status: None   Collection Time: 04/30/22  9:25 PM  Result Value Ref Range   Lactic Acid, Venous 1.5 0.5 - 1.9 mmol/L    Comment: Performed at [facility] [address], [place], [place] [address]  Hepatic function panel     Status: Abnormal   Collection Time: 04/30/22  9:25 PM  Result Value Ref Range   Total Protein 5.5 (L) 6.5 - 8.1 g/dL   Albumin 2.8 (L) 3.5 - 5.0 g/dL   AST 23 15 - 41 U/L   ALT 13 0 - 44 U/L   Alkaline Phosphatase 54 38 - 126 U/L   Total Bilirubin 0.8 0.3 - 1.2 mg/dL   Bilirubin, Direct 0.1 0.0 - 0.2 mg/dL   Indirect Bilirubin 0.7 0.3 - 0.9 mg/dL    Comment: Performed at [facility] [address] [place], [place] [address]  Lipase, blood     Status: None   Collection Time: 04/30/22  9:25 PM  Result Value Ref Range   Lipase 27 11 - 51 U/L    Comment: Performed at [facility] [address], [place], [place] [address]  Protime-INR     Status: Abnormal   Collection Time: 04/30/22  9:25 PM  Result Value Ref Range   Prothrombin Time 16.7 (H) 11.4 - 15.2 seconds   INR 1.4 (H) 0.8 - 1.2    Comment: (NOTE) INR goal varies based on device and disease states. Performed at [facility], [place] [address], [place],  [address]   Brain natriuretic peptide     Status: Abnormal   Collection Time: 04/30/22  9:46 PM  Result Value Ref Range   B Natriuretic Peptide 179.5 (H)  0.0 - 100.0 pg/mL    Comment: Performed at [facility], [place] [address] [place], [place] [address]  MRSA Next Gen by PCR, Nasal     Status: None   Collection Time: 04/30/22 10:10 PM   Specimen: Nasal Mucosa; Nasal Swab  Result Value Ref Range   MRSA by PCR Next Gen NOT DETECTED NOT DETECTED    Comment: (NOTE) The GeneXpert MRSA Assay (FDA approved for NASAL specimens only), is one component of a comprehensive MRSA colonization surveillance program. It is not intended to diagnose MRSA infection nor to  guide or monitor treatment for MRSA infections. Test performance is not FDA approved in patients less than 3 years old. Performed at [facility], [place] [address] [place], [place] [address]   I-Stat venous blood gas, ED     Status: Abnormal   Collection Time: 04/30/22 10:44 PM  Result Value Ref Range   pH, Ven 7.436 (H) 7.25 - 7.43   pCO2, Ven 34.7 (L) 44 - 60 mmHg   pO2, Ven 64 (H) 32 - 45 mmHg   Bicarbonate 23.3 20.0 - 28.0 mmol/L   TCO2 24 22 - 32 mmol/L   O2 Saturation 93 %   Acid-Base Excess 0.0 0.0 - 2.0 mmol/L   Sodium 139 135 - 145 mmol/L   Potassium 2.8 (L) 3.5 - 5.1 mmol/L   Calcium, Ion 1.01 (L) 1.15 - 1.40 mmol/L   HCT 37.0 36.0 - 46.0 %   Hemoglobin 12.6 12.0 - 15.0 g/dL   Sample type VENOUS    CT Head Wo Contrast  Result Date: 04/30/2022 CLINICAL DATA:  Altered mental status. EXAM: CT HEAD WITHOUT CONTRAST TECHNIQUE: Contiguous axial images were obtained from the base of the skull through the vertex without intravenous contrast. RADIATION DOSE REDUCTION: This exam was performed according to the departmental dose-optimization program which includes automated exposure control, adjustment of the mA and/or kV according to patient size and/or use of iterative reconstruction technique. COMPARISON:  November 13, 2012 FINDINGS: Brain: There is mild cerebral atrophy with widening of the extra-axial spaces and ventricular dilatation. There are areas of decreased attenuation within the white matter tracts of the supratentorial brain, consistent with microvascular disease changes. Small, chronic bilateral basal ganglia lacunar infarcts are noted. Vascular: No hyperdense vessel or unexpected calcification. Skull: Normal. Negative for fracture or focal lesion. Sinuses/Orbits: No acute finding. Other: None. IMPRESSION: 1. No acute intracranial abnormality. 2. Generalized cerebral atrophy and microvascular disease changes of the supratentorial brain. 3. Small, chronic bilateral basal  ganglia lacunar infarcts. Electronically Signed   By: Virgina Norfolk M.D.   On: 04/30/2022 22:10   DG Chest Port 1 View  Result Date: 04/30/2022 CLINICAL DATA:  Shortness of breath and altered mental status. EXAM: PORTABLE CHEST 1 VIEW COMPARISON:  March 29, 2022 FINDINGS: The cardiac silhouette is mildly enlarged and unchanged in size. Mild, diffuse, chronic appearing increased interstitial lung markings are noted. Mild to moderate severity areas of atelectasis and/or infiltrate are seen within the bilateral lung bases. There is a small left pleural effusion. No pneumothorax is identified. Multilevel degenerative changes seen throughout the thoracic spine. IMPRESSION: 1. Stable cardiomegaly with mild to moderate severity bibasilar atelectasis and/or infiltrate. 2. Small left pleural effusion. Electronically Signed   By: Virgina Norfolk M.D.   On: 04/30/2022 21:30    Pending Labs Unresulted Labs (From admission, onward)     Start     Ordered   05/01/22 0500  Protime-INR  Tomorrow morning,  R        04/30/22 2341   05/01/22 0500  Cortisol-am, blood  Tomorrow morning,   R        04/30/22 2341   05/01/22 0500  Procalcitonin  Tomorrow morning,   R        04/30/22 2341   05/01/22 0500  Comprehensive metabolic panel  Tomorrow morning,   R        04/30/22 2341   05/01/22 0500  CBC  Tomorrow morning,   R        04/30/22 2341   04/30/22 2315  Urinalysis, Routine w reflex microscopic  Once,   URGENT        04/30/22 2314   04/30/22 2047  Culture, blood (Routine X 2) w Reflex to ID Panel  BLOOD CULTURE X 2,   R     Question:  Patient immune status  Answer:  Normal   04/30/22 2047   04/30/22 2047  Lactic acid, plasma  Now then every 2 hours,   R      04/30/22 2047            Vitals/Pain Today's Vitals   04/30/22 2330 04/30/22 2345 05/01/22 0000 05/01/22 0015  BP: (!) 89/53 (!) 106/59 (!) 102/50 (!) 110/50  Pulse: 64 63 65 65  Resp: '19 19 18 '$ (!) 21  Temp:      TempSrc:      SpO2:  100% 100% 100% 100%  Weight:      Height:        Isolation Precautions No active isolations  Medications Medications  0.9 %  sodium chloride infusion (0 mLs Intravenous Stopped 04/30/22 2147)  vancomycin (VANCOREADY) IVPB 1500 mg/300 mL (1,500 mg Intravenous New Bag/Given 04/30/22 2235)  vancomycin (VANCOCIN) IVPB 1000 mg/200 mL premix (has no administration in time range)  predniSONE (DELTASONE) tablet 5 mg (has no administration in time range)  amiodarone (PACERONE) tablet 200 mg (has no administration in time range)  pantoprazole (PROTONIX) EC tablet 40 mg (has no administration in time range)  saccharomyces boulardii (FLORASTOR) capsule 250 mg (has no administration in time range)  rivaroxaban (XARELTO) tablet 20 mg (has no administration in time range)  Ensure liquid 237 mL (has no administration in time range)  mometasone-formoterol (DULERA) 200-5 MCG/ACT inhaler 2 puff (has no administration in time range)  levalbuterol (XOPENEX) nebulizer solution 0.63 mg (has no administration in time range)  latanoprost (XALATAN) 0.005 % ophthalmic solution 1 drop (has no administration in time range)  polyvinyl alcohol (LIQUIFILM TEARS) 1.4 % ophthalmic solution 1 drop (has no administration in time range)  sodium chloride flush (NS) 0.9 % injection 3 mL (3 mLs Intravenous Given 05/01/22 0013)  acetaminophen (TYLENOL) tablet 650 mg (has no administration in time range)    Or  acetaminophen (TYLENOL) suppository 650 mg (has no administration in time range)  polyethylene glycol (MIRALAX / GLYCOLAX) packet 17 g (has no administration in time range)  piperacillin-tazobactam (ZOSYN) IVPB 3.375 g (has no administration in time range)  sodium chloride 0.9 % bolus 1,000 mL (0 mLs Intravenous Stopped 04/30/22 2250)  piperacillin-tazobactam (ZOSYN) IVPB 3.375 g (0 g Intravenous Stopped 04/30/22 2250)  sodium chloride 0.9 % bolus 500 mL (0 mLs Intravenous Stopped 05/01/22 0013)  potassium chloride  (KLOR-CON) packet 60 mEq (60 mEq Oral Given 04/30/22 2337)  sodium chloride 0.9 % bolus 500 mL (500 mLs Intravenous New Bag/Given 05/01/22 0012)    Mobility walks with person assist High fall risk  Focused Assessments Pulmonary Assessment Handoff:  Lung sounds: Bilateral Breath Sounds: Diminished L Breath Sounds: Diminished R Breath Sounds: Diminished O2 Device: Nasal Cannula O2 Flow Rate (L/min): 3 L/min    R Recommendations: See Admitting Provider Note  Report given to:   Additional Notes:

## 2022-05-01 NOTE — Progress Notes (Addendum)
PROGRESS NOTE  Stacey Davenport  HBZ:[id number] DOB: 1941-06-04 DOA: 04/30/2022 PCP: Janifer Adie, MD   Brief Narrative:  Patient is a 81 year old female with history of asthma, hypertension, GERD, atrial fibrillation, arthritis, glaucoma, hyperlipidemia, CVA, hypothyroidism, depression, who presented from SNF with complaints of shortness of breath, altered mentation.  She was found to be hypoxic at her facility and was requiring supplemental oxygen, noted to be less aware than her baseline.  On presentation, she was hypotensive with systolic blood pressure in the range of 80s to 90s, required 3 L of oxygen.  Lab work showed potassium of 2.9, WBC count of 20.1, lactic acid of 1.5, lipase level normal.  Procalcitonin elevated.  Chest x-ray stable cardiomegaly with mild to moderate atelectasis versus infiltrate.  CT head did not show any acute intracranial abnormalities.  Patient was started on broad-spectrum antibiotics for suspicion of sepsis from pneumonia.  Blood cultures now showing gram-negative rods.  Assessment & Plan:  Principal Problem:   Severe sepsis ([place]) Active Problems:   Hypertension   Asthma, chronic, steroid-dependent   GERD   Rheumatoid arthritis (HCC)   Atrial fibrillation (H[facility])   Hyperlipidemia   History of CVA (cerebrovascular accident)   Hypothyroidism   Depression   Chronic diastolic CHF (congestive heart failure) (HCC)   Severe sepsis/bacteremia secondary to pneumonia: Presented with altered mentation, hypoxia, hypotension, leukocytosis.  Elevated procalcitonin.  Continue broad-spectrum antibiotics for now to cover for HCAP( patient [facility] at [facility]).  Follow-up cultures.  Continue gentle IV fluids. Blood cultures showing gram-negative rods on both sides.  Follow-up cultures and sensitivity.  Pneumonia: Chest x-ray showed Stable cardiomegaly with mild to moderate severity bibasilar atelectasis and/or infiltrate.  Continue broad-spectrum antibiotics for  now.  Hypokalemia: Currently being supplemented and monitored  Paroxysmal A-fib: Monitor on telemetry continue amiodarone, Xarelto.  This morning she was in normal sinus rhythm.  Chronic hypoxic respiratory failure: Recently admitted and was discharged from [facility] on 04/[facility]/2022 after she was treated for COVID-pneumonia with steroids, remdesivir.  Hospital course was also complicated with aspiration pneumonia.  She was discharged on 2 L of oxygen with activity.  Asthma: Continue home prednisone, inhaler, as needed Xopenex  Dementia: Confused at baseline.  Found to be more confused at the skilled nursing facility most likely secondary to pneumonia.  Continue delirium precautions, frequent reorientation  Hypertension: Currently blood pressure soft.  Home Lasix on hold  GERD: Continue PPI  Rheumatoid arthritis: Continue home steroid  Glaucoma: Continue home eyedrops  History of hyperlipidemia/CVA:   Hypothyroidism: Continue Synthyroid  dCHF: Has history of grade 2 diastolic dysfunction with normal ejection fraction as per the last echo.  On Lasix at home, currently on hold.  Looks dehydrated.  Currently on IV fluids         DVT prophylaxis: rivaroxaban (XARELTO) tablet 20 mg     Code Status: DNR  Family Communication: Called and discussed with son on phone on 10/7  Patient status:Inpatient  Patient is from :SNF  Anticipated discharge to:SNF  Estimated DC date:2-3 days   Consultants: None  Procedures:None  Antimicrobials:  Anti-infectives (From admission, onward)    Start     Dose/Rate Route Frequency Ordered Stop   05/01/22 2300  vancomycin (VANCOCIN) IVPB 1000 mg/200 mL premix        1,000 mg 200 mL/hr over 60 Minutes Intravenous Every 24 hours 04/30/22 2248     05/01/22 0400  piperacillin-tazobactam (ZOSYN) IVPB 3.375 g        3.375 g  12.5 mL/hr over 240 Minutes Intravenous Every 8 hours 04/30/22 2348     04/30/22 2215  piperacillin-tazobactam (ZOSYN) IVPB  3.375 g        3.375 g 100 mL/hr over 30 Minutes Intravenous  Once 04/30/22 2204 04/30/22 2250   04/30/22 2215  vancomycin (VANCOREADY) IVPB 1500 mg/300 mL        1,500 mg 150 mL/hr over 120 Minutes Intravenous  Once 04/30/22 2209 05/01/22 0037       Subjective: Patient seen and examined at the bedside today.  She was hemodynamically stable, lying in the bed.  On 2 L of oxygen per minute.  Looks very deconditioned, weak but overall comfortable, alert and awake and communicates well but not oriented to time.  Denies any worsening shortness of breath or cough.  Objective: Vitals:   05/01/22 0048 05/01/22 0121 05/01/22 0400 05/01/22 0808  BP: (!) 105/53 (!) 104/58 (!) 104/47   Pulse: (!) 58 64 60   Resp: (!) 25  14   Temp: 98.2 F (36.8 C)  98.3 F (36.8 C) 98 F (36.7 C)  TempSrc: Axillary  Axillary Oral  SpO2: 100% 100% 96%   Weight:      Height:        Intake/Output Summary (Last 24 hours) at 05/01/2022 0826 Last data filed at 05/01/2022 0302 Gross per 24 hour  Intake 2795 ml  Output --  Net 2795 ml   Filed Weights   04/30/22 2043  Weight: 65.9 kg    Examination:  General exam: Overall comfortable, not in distress, deconditioned elderly female HEENT: PERRL Respiratory system:  no wheezes or crackles  Cardiovascular system: S1 & S2 heard, RRR.  Gastrointestinal system: Abdomen is nondistended, soft and nontender. Central nervous system: Alert and weak but not oriented Extremities: No edema, no clubbing ,no cyanosis Skin: No rashes, no ulcers,no icterus     Data Reviewed: I have personally reviewed following labs and imaging studies  CBC: Recent Labs  Lab 04/30/22 2125 04/30/22 2244 05/01/22 0211  WBC 20.1*  --  25.2*  NEUTROABS 17.5*  --   --   HGB 13.9 12.6 12.1  HCT 42.5 37.0 37.2  MCV 96.2  --  97.4  PLT 231  --  725   Basic Metabolic Panel: Recent Labs  Lab 04/30/22 2125 04/30/22 2244 05/01/22 0211  NA 139 139 141  K 2.9* 2.8* 3.1*  CL 105   --  117*  CO2 22  --  19*  GLUCOSE 101*  --  99  BUN 10  --  9  CREATININE 0.82  --  0.67  CALCIUM 8.3*  --  6.4*     Recent Results (from the past 240 hour(s))  SARS Coronavirus 2 by RT PCR (hospital order, performed in [facility] hospital lab) *cepheid single result test*     Status: None   Collection Time: 04/30/22  8:48 PM   Specimen: Nasal Swab  Result Value Ref Range Status   SARS Coronavirus 2 by RT PCR NEGATIVE NEGATIVE Final    Comment: (NOTE) SARS-CoV-2 target nucleic acids are NOT DETECTED.  The SARS-CoV-2 RNA is generally detectable in upper and lower respiratory specimens during the acute phase of infection. The lowest concentration of SARS-CoV-2 viral copies this assay can detect is 250 copies / mL. A negative result does not preclude SARS-CoV-2 infection and should not be used as the sole basis for treatment or other patient management decisions.  A negative result may occur with improper specimen  collection / handling, submission of specimen other than nasopharyngeal swab, presence of viral mutation(s) within the areas targeted by this assay, and inadequate number of viral copies (<250 copies / mL). A negative result must be combined with clinical observations, patient history, and epidemiological information.  Fact Sheet for Patients:   https://www.patel.info/  Fact Sheet for Healthcare Providers: https://hall.com/  This test is not yet approved or  cleared by the [place] FDA and has been authorized for detection and/or diagnosis of SARS-CoV-2 by FDA under an Emergency Use Authorization (EUA).  This EUA will remain in effect (meaning this test can be used) for the duration of the COVID-19 declaration under Section 564(b)(1) of the Act, 21 U.S.C. section 360bbb-3(b)(1), unless the authorization is terminated or revoked sooner.  Performed at [facility], [place] [address] [place], [place] [address]    Culture, blood (Routine X 2) w Reflex to ID Panel     Status: None (Preliminary result)   Collection Time: 04/30/22  9:15 PM   Specimen: BLOOD  Result Value Ref Range Status   Specimen Description BLOOD BLOOD RIGHT FOREARM  Final   Special Requests   Final    BOTTLES DRAWN AEROBIC AND ANAEROBIC Blood Culture adequate volume   Culture   Final    NO GROWTH < 12 HOURS Performed at [facility], [place] [address] [place], [place] [address]    Report Status PENDING  Incomplete  Culture, blood (Routine X 2) w Reflex to ID Panel     Status: None (Preliminary result)   Collection Time: 04/30/22  9:30 PM   Specimen: BLOOD  Result Value Ref Range Status   Specimen Description BLOOD BLOOD LEFT FOREARM  Final   Special Requests   Final    BOTTLES DRAWN AEROBIC AND ANAEROBIC Blood Culture adequate volume   Culture   Final    NO GROWTH < 12 HOURS Performed at [facility], [place] [address] [place], [place] [address]    Report Status PENDING  Incomplete  MRSA Next Gen by PCR, Nasal     Status: None   Collection Time: 04/30/22 10:10 PM   Specimen: Nasal Mucosa; Nasal Swab  Result Value Ref Range Status   MRSA by PCR Next Gen NOT DETECTED NOT DETECTED Final    Comment: (NOTE) The GeneXpert MRSA Assay (FDA approved for NASAL specimens only), is one component of a comprehensive MRSA colonization surveillance program. It is not intended to diagnose MRSA infection nor to guide or monitor treatment for MRSA infections. Test performance is not FDA approved in patients less than 10 years old. Performed at [facility], [place] [address], [place], [place] [address]      Radiology Studies: CT Head Wo Contrast  Result Date: 04/30/2022 CLINICAL DATA:  Altered mental status. EXAM: CT HEAD WITHOUT CONTRAST TECHNIQUE: Contiguous axial images were obtained from the base of the skull through the vertex without intravenous contrast. RADIATION DOSE REDUCTION: This exam was performed according  to the departmental dose-optimization program which includes automated exposure control, adjustment of the mA and/or kV according to patient size and/or use of iterative reconstruction technique. COMPARISON:  November 13, 2012 FINDINGS: Brain: There is mild cerebral atrophy with widening of the extra-axial spaces and ventricular dilatation. There are areas of decreased attenuation within the white matter tracts of the supratentorial brain, consistent with microvascular disease changes. Small, chronic bilateral basal ganglia lacunar infarcts are noted. Vascular: No hyperdense vessel or unexpected calcification. Skull: Normal.  Negative for fracture or focal lesion. Sinuses/Orbits: No acute finding. Other: None. IMPRESSION: 1. No acute intracranial abnormality. 2. Generalized cerebral atrophy and microvascular disease changes of the supratentorial brain. 3. Small, chronic bilateral basal ganglia lacunar infarcts. Electronically Signed   By: Virgina Norfolk M.D.   On: 04/30/2022 22:10   DG Chest Port 1 View  Result Date: 04/30/2022 CLINICAL DATA:  Shortness of breath and altered mental status. EXAM: PORTABLE CHEST 1 VIEW COMPARISON:  March 29, 2022 FINDINGS: The cardiac silhouette is mildly enlarged and unchanged in size. Mild, diffuse, chronic appearing increased interstitial lung markings are noted. Mild to moderate severity areas of atelectasis and/or infiltrate are seen within the bilateral lung bases. There is a small left pleural effusion. No pneumothorax is identified. Multilevel degenerative changes seen throughout the thoracic spine. IMPRESSION: 1. Stable cardiomegaly with mild to moderate severity bibasilar atelectasis and/or infiltrate. 2. Small left pleural effusion. Electronically Signed   By: Virgina Norfolk M.D.   On: 04/30/2022 21:30    Scheduled Meds:  amiodarone  200 mg Oral Daily   feeding supplement  237 mL Oral BID BM   latanoprost  1 drop Both Eyes QHS   mometasone-formoterol  2 puff  Inhalation BID   pantoprazole  40 mg Oral Daily   predniSONE  5 mg Oral Q breakfast   rivaroxaban  20 mg Oral Q supper   saccharomyces boulardii  250 mg Oral Daily   sodium chloride flush  3 mL Intravenous Q12H   Continuous Infusions:  sodium chloride Stopped (04/30/22 2147)   piperacillin-tazobactam (ZOSYN)  IV 3.375 g (05/01/22 0302)   vancomycin       LOS: 1 day   Shelly Coss, MD [facility] P10/01/2022, 8:26 AM

## 2022-05-02 DIAGNOSIS — A419 Sepsis, unspecified organism: Secondary | ICD-10-CM | POA: Diagnosis not present

## 2022-05-02 DIAGNOSIS — R7881 Bacteremia: Secondary | ICD-10-CM

## 2022-05-02 DIAGNOSIS — R6520 Severe sepsis without septic shock: Secondary | ICD-10-CM | POA: Diagnosis not present

## 2022-05-02 LAB — CBC
HCT: 33.1 % — ABNORMAL LOW (ref 36.0–46.0)
Hemoglobin: 10.5 g/dL — ABNORMAL LOW (ref 12.0–15.0)
MCH: 30.9 pg (ref 26.0–34.0)
MCHC: 31.7 g/dL (ref 30.0–36.0)
MCV: 97.4 fL (ref 80.0–100.0)
Platelets: 198 10*3/uL (ref 150–400)
RBC: 3.4 MIL/uL — ABNORMAL LOW (ref 3.87–5.11)
RDW: 15.4 % (ref 11.5–15.5)
WBC: 21.1 10*3/uL — ABNORMAL HIGH (ref 4.0–10.5)
nRBC: 0 % (ref 0.0–0.2)

## 2022-05-02 LAB — BASIC METABOLIC PANEL
Anion gap: 5 (ref 5–15)
BUN: 14 mg/dL (ref 8–23)
CO2: 20 mmol/L — ABNORMAL LOW (ref 22–32)
Calcium: 7.9 mg/dL — ABNORMAL LOW (ref 8.9–10.3)
Chloride: 115 mmol/L — ABNORMAL HIGH (ref 98–111)
Creatinine, Ser: 0.68 mg/dL (ref 0.44–1.00)
GFR, Estimated: >60 mL/min (ref >60)
Glucose, Bld: 98 mg/dL (ref 70–99)
Potassium: 4.2 mmol/L (ref 3.5–5.1)
Sodium: 140 mmol/L (ref 135–145)

## 2022-05-02 LAB — MAGNESIUM: Magnesium: 1.3 mg/dL — ABNORMAL LOW (ref 1.7–2.4)

## 2022-05-02 MED ORDER — MAGNESIUM SULFATE 4 GM/100ML IV SOLN
4.0000 g | Freq: Once | INTRAVENOUS | Status: AC
  Administered 2022-05-02: 4 g via INTRAVENOUS
  Filled 2022-05-02: qty 100

## 2022-05-02 MED ORDER — MELATONIN 3 MG PO TABS
3.0000 mg | ORAL_TABLET | Freq: Once | ORAL | Status: AC
  Administered 2022-05-02: 3 mg via ORAL
  Filled 2022-05-02: qty 1

## 2022-05-02 NOTE — Consult Note (Signed)
[facility]    Date of Admission:  04/30/2022     Reason for Consult: esbl ecoli bacteremia    Referring Provider: Tawanna Solo    Abx: meropenem        Assessment: 81 yo female asthma, htn, gerd, afib, glaucoma, hx cva, HFpEF admitted for sepsis/ams and hypoxic resp failure of 1 day found to have esbl ecoli bacteremia  The source is unclear Cxr questionalbe atelectasis vs infiltrate. Currently presumed pna although this would be uncommon. And really no symptomatology of pna  No urine culture or UA done. No uti sx either Admission bcx 10/6 esbl ecoli (ctx-type beta lactamase)  Clinically improving on meropenem   Wbc 20-->25-->21   Plan: Continue meropenem for now Follow up susceptibility -- could transition to PO cipro/bactrim to finish course once patient's sepsis resolves (if susceptible Given unclear source I would aim for 10 day of treatment. I suspect the hypoxic resp failure could be explained by severe sepsis given rather lacking of pna symptomatology While we typically don't do ct abd/pelv for simple gram negative sepsis, if her leukocytosis is severe/persistent I would consider getting one to make sure there is no occult intraabd abscess to account for the significant leukocytosis Discussed with primary team    I spent 75 minute reviewing data/chart, and coordinating care and >50% direct face to face time providing counseling/discussing diagnostics/treatment plan with patient   ------------------------------------------------ Principal Problem:   Severe sepsis ([place]) Active Problems:   Hypertension   Asthma, chronic, steroid-dependent   GERD   Rheumatoid arthritis ([place])   Atrial fibrillation ([place])   Hyperlipidemia   History of CVA (cerebrovascular accident)   Hypothyroidism   Depression   Chronic diastolic CHF (congestive heart failure) (HCC)    HPI: Stacey Davenport is a 81 y.o. female asthma, htn, gerd, afib,  glaucoma, hx cva, HFpEF admitted for sepsis/ams and hypoxic resp failure of 1 day found to have esbl ecoli bacteremia  Hx via chart and discussion with patient  Patient is a relatively reliable historian  She reports she was feeling unwell/tired for about a day and then the next thing she knows she is in the hospital  She has intermittent urgency/frequency but nothing recent and no burning/flank pain/n-v Denies f/c Denies focal bodily discomfort Denies cough/chest pain Denies abd pain/diarrhea   In the ed, She has borderline sbp but didn't require pressors/fluid responsive. O2 requirment up to 3 liters Leukocytosis Bcx returned ultimately with esbl ecoli Urinalysis unremarkable Started on vanc/zosyn changed to piptazo when esbl returned on bcid  She is on room air hd#2 now as I speak with her and no discomfort     Family History  Problem Relation Age of Onset   Diabetes Mother    Cancer Father        lymph nodes???   Diabetes Sister    Heart disease Sister    Other Son        he was shot   Colon cancer Neg Hx    Esophageal cancer Neg Hx     Social History   Tobacco Use   Smoking status: Never   Smokeless tobacco: Never  Vaping Use   Vaping Use: Never used  Substance Use Topics   Alcohol use: No    Alcohol/week: 0.0 standard drinks of alcohol   Drug use: No    Allergies  Allergen Reactions   Aciphex [Rabeprazole Sodium] Other (See Comments)    Unknown  reaction Listed on MAR   Amoxil [Amoxicillin] Other (See Comments)    Unknown reaction Listed on MAR   Avelox [Moxifloxacin] Other (See Comments)    Unknown reaction Listed on MAR   Biaxin [Clarithromycin] Hives and Itching   Hydrocodone Other (See Comments)    Unknown reaction  Not listed on MAR   Ketek [Telithromycin] Other (See Comments)    Unknown reaction Not listed on MAR   Latex Other (See Comments)    Unknown reaction Listed on MAR   Levaquin [Levofloxacin] Other (See Comments)    Unknown  reaction  Listed on MAR   Lipitor [Atorvastatin] Other (See Comments)    Unknown reaction Listed on MAR   Lyrica [Pregabalin] Other (See Comments)    Unknown reaction Listed on MAR   Macrobid [Nitrofurantoin Macrocrystal] Other (See Comments)    Unknown reaction  Listed on MAR   Nexium [Esomeprazole Magnesium] Other (See Comments)    Unknown reaction Listed on MAR   Sulfa Antibiotics Other (See Comments)    Unknown reaction  Listed on MAR   Sulfonamide Derivatives Other (See Comments)    Unknown reaction Listed on MAR   Vesicare [Solifenacin] Other (See Comments)    Unknown reaction Listed on MAR   Zithromax [Azithromycin] Other (See Comments)    Unknown reaction Listed on MAR   Keflex [Cephalexin] Itching and Rash    Review of Systems: ROS All Other ROS was negative, except mentioned above   Past Medical History:  Diagnosis Date   Anxiety    Asthma    Atrial fibrillation (HCC)    CVA (cerebral infarction)    a. 09/2011 MRI tiny bilat centrum semiovale acute non hemorrhagic infarcts   Dementia (HCC)    mild per patient   Esophageal stricture    Gastric polyp    Hyperplastic   Gastritis    GERD (gastroesophageal reflux disease)    Glaucoma    Hiatal hernia    Hyperlipidemia    Hypertension    Hypothyroidism    Osteoarthritis    Pneumonia    Rheumatoid arthritis(714.0)    Thyroid disease        Scheduled Meds:  amiodarone  200 mg Oral Daily   feeding supplement  237 mL Oral BID BM   latanoprost  1 drop Both Eyes QHS   midodrine  5 mg Oral TID WC   mometasone-formoterol  2 puff Inhalation BID   pantoprazole  40 mg Oral Daily   predniSONE  5 mg Oral Q breakfast   rivaroxaban  20 mg Oral Q supper   saccharomyces boulardii  250 mg Oral Daily   sodium chloride flush  3 mL Intravenous Q12H   Continuous Infusions:  sodium chloride 10 mL/hr at 05/01/22 1417   magnesium sulfate bolus IVPB 4 g (05/02/22 1145)   meropenem (MERREM) IV 1 g (05/02/22 1045)    PRN Meds:.acetaminophen **OR** acetaminophen, diclofenac Sodium, levalbuterol, polyethylene glycol, polyvinyl alcohol   OBJECTIVE: Blood pressure (!) 110/52, pulse 68, temperature 98 F (36.7 C), resp. rate 20, height '5\' 3"'$  (1.6 m), weight 65.9 kg, SpO2 98 %.  Physical Exam  General/constitutional: no distress, pleasant, conversant, on room air satting 100% on monitor HEENT: Normocephalic, PER, Conj Clear, EOMI, Oropharynx clear Neck supple CV: rrr no mrg Lungs: clear to auscultation, normal respiratory effort Abd: Soft, Nontender Ext: no edema Skin: No Rash; old hyperpigmentation change bilateral LE Neuro: nonfocal -- bilateral LE 4-5% symmetric strength MSK: no peripheral joint swelling/tenderness/warmth; back spines nontender  Lab Results Lab Results  Component Value Date   WBC 21.1 (H) 05/02/2022   HGB 10.5 (L) 05/02/2022   HCT 33.1 (L) 05/02/2022   MCV 97.4 05/02/2022   PLT 198 05/02/2022    Lab Results  Component Value Date   CREATININE 0.68 05/02/2022   BUN 14 05/02/2022   NA 140 05/02/2022   K 4.2 05/02/2022   CL 115 (H) 05/02/2022   CO2 20 (L) 05/02/2022    Lab Results  Component Value Date   ALT 10 05/01/2022   AST 20 05/01/2022   ALKPHOS 37 (L) 05/01/2022   BILITOT 0.7 05/01/2022      Microbiology: Recent Results (from the past 240 hour(s))  SARS Coronavirus 2 by RT PCR (hospital order, performed in [facility] hospital lab) *cepheid single result test*     Status: None   Collection Time: 04/30/22  8:48 PM   Specimen: Nasal Swab  Result Value Ref Range Status   SARS Coronavirus 2 by RT PCR NEGATIVE NEGATIVE Final    Comment: (NOTE) SARS-CoV-2 target nucleic acids are NOT DETECTED.  The SARS-CoV-2 RNA is generally detectable in upper and lower respiratory specimens during the acute phase of infection. The lowest concentration of SARS-CoV-2 viral copies this assay can detect is 250 copies / mL. A negative result does not preclude SARS-CoV-2  infection and should not be used as the sole basis for treatment or other patient management decisions.  A negative result may occur with improper specimen collection / handling, submission of specimen other than nasopharyngeal swab, presence of viral mutation(s) within the areas targeted by this assay, and inadequate number of viral copies (<250 copies / mL). A negative result must be combined with clinical observations, patient history, and epidemiological information.  Fact Sheet for Patients:   https://www.patel.info/  Fact Sheet for Healthcare Providers: https://hall.com/  This test is not yet approved or  cleared by the [place] FDA and has been authorized for detection and/or diagnosis of SARS-CoV-2 by FDA under an Emergency Use Authorization (EUA).  This EUA will remain in effect (meaning this test can be used) for the duration of the COVID-19 declaration under Section 564(b)(1) of the Act, 21 U.S.C. section 360bbb-3(b)(1), unless the authorization is terminated or revoked sooner.  Performed at [facility], [place] [address], [place], [place] [address]   Culture, blood (Routine X 2) w Reflex to ID Panel     Status: None (Preliminary result)   Collection Time: 04/30/22  9:15 PM   Specimen: BLOOD  Result Value Ref Range Status   Specimen Description BLOOD BLOOD RIGHT FOREARM  Final   Special Requests   Final    BOTTLES DRAWN AEROBIC AND ANAEROBIC Blood Culture adequate volume   Culture  Setup Time   Final    GRAM NEGATIVE RODS IN BOTH AEROBIC AND ANAEROBIC BOTTLES CRITICAL VALUE NOTED.  VALUE IS CONSISTENT WITH PREVIOUSLY REPORTED AND CALLED VALUE. Performed at [facility], [place] [address], [place], [place] [address]    Culture GRAM NEGATIVE RODS  Final   Report Status PENDING  Incomplete  Culture, blood (Routine X 2) w Reflex to ID Panel     Status: Abnormal (Preliminary result)   Collection Time: 04/30/22  9:30  PM   Specimen: BLOOD  Result Value Ref Range Status   Specimen Description BLOOD BLOOD LEFT FOREARM  Final   Special Requests   Final    BOTTLES DRAWN AEROBIC AND ANAEROBIC Blood Culture adequate volume  Culture  Setup Time   Final    GRAM NEGATIVE RODS IN BOTH AEROBIC AND ANAEROBIC BOTTLES CRITICAL RESULT CALLED TO, READ BACK BY AND VERIFIED WITH: PHARMD K PIERCE [id number] AT 63 AM BY CM    Culture (A)  Final    ESCHERICHIA COLI SUSCEPTIBILITIES TO FOLLOW Performed at [facility], [place] [address] [place], [place] [address]    Report Status PENDING  Incomplete  Blood Culture ID Panel (Reflexed)     Status: Abnormal (Preliminary result)   Collection Time: 04/30/22  9:30 PM  Result Value Ref Range Status   Enterococcus faecalis NOT DETECTED NOT DETECTED Final   Enterococcus Faecium NOT DETECTED NOT DETECTED Final   Listeria monocytogenes NOT DETECTED NOT DETECTED Final   Staphylococcus species NOT DETECTED NOT DETECTED Final   Staphylococcus aureus (BCID) NOT DETECTED NOT DETECTED Final   Staphylococcus epidermidis NOT DETECTED NOT DETECTED Final   Staphylococcus lugdunensis NOT DETECTED NOT DETECTED Final   Streptococcus species NOT DETECTED NOT DETECTED Final   Streptococcus agalactiae NOT DETECTED NOT DETECTED Final   Streptococcus pneumoniae NOT DETECTED NOT DETECTED Final   Streptococcus pyogenes NOT DETECTED NOT DETECTED Final   A.calcoaceticus-baumannii NOT DETECTED NOT DETECTED Final   Bacteroides fragilis NOT DETECTED NOT DETECTED Final   Enterobacterales PENDING NOT DETECTED Incomplete   Enterobacter cloacae complex NOT DETECTED NOT DETECTED Final   Escherichia coli DETECTED (A) NOT DETECTED Final    Comment: CRITICAL RESULT CALLED TO, READ BACK BY AND VERIFIED WITH: PHARMD K PIERCE [id number] AT 1156 AM BY CM    Klebsiella aerogenes NOT DETECTED NOT DETECTED Final   Klebsiella oxytoca NOT DETECTED NOT DETECTED Final   Klebsiella pneumoniae NOT DETECTED NOT  DETECTED Final   Proteus species NOT DETECTED NOT DETECTED Final   Salmonella species NOT DETECTED NOT DETECTED Final   Serratia marcescens NOT DETECTED NOT DETECTED Final   Haemophilus influenzae NOT DETECTED NOT DETECTED Final   Neisseria meningitidis NOT DETECTED NOT DETECTED Final   Pseudomonas aeruginosa NOT DETECTED NOT DETECTED Final   Stenotrophomonas maltophilia NOT DETECTED NOT DETECTED Final   Candida albicans NOT DETECTED NOT DETECTED Final   Candida auris NOT DETECTED NOT DETECTED Final   Candida glabrata NOT DETECTED NOT DETECTED Final   Candida krusei NOT DETECTED NOT DETECTED Final   Candida parapsilosis NOT DETECTED NOT DETECTED Final   Candida tropicalis NOT DETECTED NOT DETECTED Final   Cryptococcus neoformans/gattii NOT DETECTED NOT DETECTED Final   CTX-M ESBL DETECTED (A) NOT DETECTED Final    Comment: CRITICAL RESULT CALLED TO, READ BACK BY AND VERIFIED WITH: PHARMD K PIERCE [id number] AT 1156 AM BY CM (NOTE) Extended spectrum beta-lactamase detected. Recommend a carbapenem as initial therapy.      Carbapenem resistance IMP NOT DETECTED NOT DETECTED Final   Carbapenem resistance KPC NOT DETECTED NOT DETECTED Final   Carbapenem resistance NDM NOT DETECTED NOT DETECTED Final   Carbapenem resist OXA 48 LIKE NOT DETECTED NOT DETECTED Final   Carbapenem resistance VIM NOT DETECTED NOT DETECTED Final    Comment: Performed at [facility], [place] [address], [place], [place] [address]  MRSA Next Gen by PCR, Nasal     Status: None   Collection Time: 04/30/22 10:10 PM   Specimen: Nasal Mucosa; Nasal Swab  Result Value Ref Range Status   MRSA by PCR Next Gen NOT DETECTED NOT DETECTED Final    Comment: (NOTE) The GeneXpert MRSA Assay (FDA approved for NASAL specimens only), is one  component of a comprehensive MRSA colonization surveillance program. It is not intended to diagnose MRSA infection nor to guide or monitor treatment for MRSA infections. Test performance is  not FDA approved in patients less than 58 years old. Performed at [facility], [place] [address], [place], [place] [address]      Serology:    Imaging: If present, new imagings (plain films, ct scans, and mri) have been personally visualized and interpreted; radiology reports have been reviewed. Decision making incorporated into the Impression / Recommendations.  10/6 cxr 1. Stable cardiomegaly with mild to moderate severity bibasilar atelectasis and/or infiltrate. 2. Small left pleural effusion.    Jabier Mutton, [facility] [place] [phone] pager    05/02/2022, 12:13 PM

## 2022-05-02 NOTE — Progress Notes (Signed)
PROGRESS NOTE  Stacey Davenport  IFO:[id number] DOB: 12/11/1940 DOA: 04/30/2022 PCP: Janifer Adie, MD   Brief Narrative:  Patient is a 81 year old female with history of asthma, hypertension, GERD, atrial fibrillation, arthritis, glaucoma, hyperlipidemia, CVA, hypothyroidism, depression, who presented from SNF with complaints of shortness of breath, altered mentation.  She was found to be hypoxic at her facility and was requiring supplemental oxygen, noted to be less aware than her baseline.  On presentation, she was hypotensive with systolic blood pressure in the range of 80s to 90s, required 3 L of oxygen.  Lab work showed potassium of 2.9, WBC count of 20.1, lactic acid of 1.5, lipase level normal.  Procalcitonin elevated.  Chest x-ray stable cardiomegaly with mild to moderate atelectasis versus infiltrate.  CT head did not show any acute intracranial abnormalities.  Patient was started on broad-spectrum antibiotics for suspicion of sepsis from pneumonia.  Blood cultures now showing ESBL E. coli, ID consulted, currently on meropenem.  Assessment & Plan:  Principal Problem:   Severe sepsis ([place]) Active Problems:   Hypertension   Asthma, chronic, steroid-dependent   GERD   Rheumatoid arthritis (HCC)   Atrial fibrillation (HCC)   Hyperlipidemia   History of CVA (cerebrovascular accident)   Hypothyroidism   Depression   Chronic diastolic CHF (congestive heart failure) (HCC)   Severe sepsis/E. coli bacteremia secondary to pneumonia: Presented with altered mentation, hypoxia, hypotension, leukocytosis.  Elevated procalcitonin.  Blood cultures showed ESBL E. coli .currently on meropenem .ID following.  Sepsis  physiology is better.  Afebrile, blood pressure better today.  Pneumonia: Chest x-ray showed Stable cardiomegaly with mild to moderate severity bibasilar atelectasis and/or infiltrate.  Continue current antibiotics for now.  Respiratory status stable  Hypomagnesemia: Currently being  supplemented and monitored  Paroxysmal A-fib: Monitor on telemetry continue.  Rate controlled amiodarone, Xarelto.  This morning she was in normal sinus rhythm.  Chronic hypoxic respiratory failure: Recently admitted and was discharged from [facility] on 04/02/2022 after she was treated for COVID-pneumonia with steroids, remdesivir.  Hospital course was also complicated with aspiration pneumonia.  She was discharged on 2 L of oxygen with activity.  Asthma: Continue home prednisone, inhaler, as needed Xopenex  Dementia: Confused at baseline.  Found to be more confused at the skilled nursing facility most likely secondary to pneumonia/bacteremia.  Continue delirium precautions, frequent reorientation.  Mentation has improved now.  Hypertension: Currently blood pressure soft.  Home Lasix on hold  GERD: Continue PPI  Rheumatoid arthritis: Continue home steroid  Glaucoma: Continue home eyedrops  History of hyperlipidemia/CVA: Currently not on medications  Hypothyroidism: Continue Synthyroid  dCHF: Has history of grade 2 diastolic dysfunction with normal ejection fraction as per the last echo.  On Lasix at home, currently on hold.  She looked dehydrated and was hypotensive so started on IV fluids. IV fluid stopped today     Pressure Injury 05/01/22 Heel Left Stage 2 -  Partial thickness loss of dermis presenting as a shallow open injury with a red, pink wound bed without slough. (Active)  05/01/22 1000  Location: Heel  Location Orientation: Left  Staging: Stage 2 -  Partial thickness loss of dermis presenting as a shallow open injury with a red, pink wound bed without slough.  Wound Description (Comments):   Present on Admission: Yes  Dressing Type Foam - Lift dressing to assess site every shift 05/02/22 0800     Pressure Injury 05/01/22 Heel Right Stage 1 -  Intact skin with non-blanchable redness  of a localized area usually over a bony prominence. (Active)  05/01/22 1000  Location:  Heel  Location Orientation: Right  Staging: Stage 1 -  Intact skin with non-blanchable redness of a localized area usually over a bony prominence.  Wound Description (Comments):   Present on Admission: Yes  Dressing Type Foam - Lift dressing to assess site every shift 05/02/22 0800    DVT prophylaxis: rivaroxaban (XARELTO) tablet 20 mg     Code Status: DNR  Family Communication: Called and discussed with son on phone on 10/8  Patient status:Inpatient  Patient is from :SNF  Anticipated discharge to:SNF  Estimated DC date:2-3 days   Consultants: None  Procedures:None  Antimicrobials:  Anti-infectives (From admission, onward)    Start     Dose/Rate Route Frequency Ordered Stop   05/01/22 2300  vancomycin (VANCOCIN) IVPB 1000 mg/200 mL premix  Status:  Discontinued        1,000 mg 200 mL/hr over 60 Minutes Intravenous Every 24 hours 04/30/22 2248 05/01/22 1217   05/01/22 1300  meropenem (MERREM) 1 g in sodium chloride 0.9 % 100 mL IVPB        1 g 200 mL/hr over 30 Minutes Intravenous Every 12 hours 05/01/22 1209     05/01/22 0400  piperacillin-tazobactam (ZOSYN) IVPB 3.375 g  Status:  Discontinued        3.375 g 12.5 mL/hr over 240 Minutes Intravenous Every 8 hours 04/30/22 2348 05/01/22 1209   04/30/22 2215  piperacillin-tazobactam (ZOSYN) IVPB 3.375 g        3.375 g 100 mL/hr over 30 Minutes Intravenous  Once 04/30/22 2204 04/30/22 2250   04/30/22 2215  vancomycin (VANCOREADY) IVPB 1500 mg/300 mL        1,500 mg 150 mL/hr over 120 Minutes Intravenous  Once 04/30/22 2209 05/01/22 0037       Subjective: Patient seen and examined at the bedside today.  She looks better.  Blood pressures improved.  Alert and awake but confused, not agitated.  Not in any kind of distress.  On 2 L of oxygen per minute.  Objective: Vitals:   05/01/22 1242 05/01/22 1524 05/02/22 0459 05/02/22 0745  BP: (!) 103/55 (!) 89/50 112/68   Pulse: (!) 59 (!) 58 64 64  Resp: '17 18 17 16  '$ Temp:  98.2 F (36.8 C) (!) 97.3 F (36.3 C) 98 F (36.7 C)   TempSrc: Oral Axillary Axillary   SpO2: 100% 100% 100%   Weight:      Height:        Intake/Output Summary (Last 24 hours) at 05/02/2022 1033 Last data filed at 05/01/2022 1710 Gross per 24 hour  Intake 614.82 ml  Output 500 ml  Net 114.82 ml   Filed Weights   04/30/22 2043  Weight: 65.9 kg    Examination:   General exam: Overall comfortable, not in distress,deconditioned HEENT: PERRL Respiratory system: Crackles in bases Cardiovascular system: S1 & S2 heard, RRR.  Gastrointestinal system: Abdomen is nondistended, soft and nontender. Central nervous system: Alert and awake, not oriented to time Extremities: No edema, no clubbing ,no cyanosis Skin: No rashes, no ulcers,no icterus      Data Reviewed: I have personally reviewed following labs and imaging studies  CBC: Recent Labs  Lab 04/30/22 2125 04/30/22 2244 05/01/22 0211 05/02/22 0311  WBC 20.1*  --  25.2* 21.1*  NEUTROABS 17.5*  --   --   --   HGB 13.9 12.6 12.1 10.5*  HCT 42.5 37.0 37.2  33.1*  MCV 96.2  --  97.4 97.4  PLT 231  --  205 366   Basic Metabolic Panel: Recent Labs  Lab 04/30/22 2125 04/30/22 2244 05/01/22 0211 05/02/22 0311  NA 139 139 141 140  K 2.9* 2.8* 3.1* 4.2  CL 105  --  117* 115*  CO2 22  --  19* 20*  GLUCOSE 101*  --  99 98  BUN 10  --  9 14  CREATININE 0.82  --  0.67 0.68  CALCIUM 8.3*  --  6.4* 7.9*  MG  --   --   --  1.3*     Recent Results (from the past 240 hour(s))  SARS Coronavirus 2 by RT PCR (hospital order, performed in [facility] hospital lab) *cepheid single result test*     Status: None   Collection Time: 04/30/22  8:48 PM   Specimen: Nasal Swab  Result Value Ref Range Status   SARS Coronavirus 2 by RT PCR NEGATIVE NEGATIVE Final    Comment: (NOTE) SARS-CoV-2 target nucleic acids are NOT DETECTED.  The SARS-CoV-2 RNA is generally detectable in upper and lower respiratory specimens during the acute  phase of infection. The lowest concentration of SARS-CoV-2 viral copies this assay can detect is 250 copies / mL. A negative result does not preclude SARS-CoV-2 infection and should not be used as the sole basis for treatment or other patient management decisions.  A negative result may occur with improper specimen collection / handling, submission of specimen other than nasopharyngeal swab, presence of viral mutation(s) within the areas targeted by this assay, and inadequate number of viral copies (<250 copies / mL). A negative result must be combined with clinical observations, patient history, and epidemiological information.  Fact Sheet for Patients:   https://www.patel.info/  Fact Sheet for Healthcare Providers: https://hall.com/  This test is not yet approved or  cleared by the [place] FDA and has been authorized for detection and/or diagnosis of SARS-CoV-2 by FDA under an Emergency Use Authorization (EUA).  This EUA will remain in effect (meaning this test can be used) for the duration of the COVID-19 declaration under Section 564(b)(1) of the Act, 21 U.S.C. section 360bbb-3(b)(1), unless the authorization is terminated or revoked sooner.  Performed at [facility], [place] [address] [place], [place] [address]   Culture, blood (Routine X 2) w Reflex to ID Panel     Status: None (Preliminary result)   Collection Time: 04/30/22  9:15 PM   Specimen: BLOOD  Result Value Ref Range Status   Specimen Description BLOOD BLOOD RIGHT FOREARM  Final   Special Requests   Final    BOTTLES DRAWN AEROBIC AND ANAEROBIC Blood Culture adequate volume   Culture  Setup Time   Final    GRAM NEGATIVE RODS IN BOTH AEROBIC AND ANAEROBIC BOTTLES CRITICAL VALUE NOTED.  VALUE IS CONSISTENT WITH PREVIOUSLY REPORTED AND CALLED VALUE. Performed at [facility], [place] [address] [place], [place] [address]    Culture GRAM NEGATIVE RODS  Final    Report Status PENDING  Incomplete  Culture, blood (Routine X 2) w Reflex to ID Panel     Status: Abnormal (Preliminary result)   Collection Time: 04/30/22  9:30 PM   Specimen: BLOOD  Result Value Ref Range Status   Specimen Description BLOOD BLOOD LEFT FOREARM  Final   Special Requests   Final    BOTTLES DRAWN AEROBIC AND ANAEROBIC Blood Culture adequate volume   Culture  Setup  Time   Final    GRAM NEGATIVE RODS IN BOTH AEROBIC AND ANAEROBIC BOTTLES CRITICAL RESULT CALLED TO, READ BACK BY AND VERIFIED WITH: PHARMD K PIERCE [id number] AT 1156 AM BY CM    Culture (A)  Final    ESCHERICHIA COLI SUSCEPTIBILITIES TO FOLLOW Performed at [facility], [place] [address] [place], [place] [address]    Report Status PENDING  Incomplete  Blood Culture ID Panel (Reflexed)     Status: Abnormal (Preliminary result)   Collection Time: 04/30/22  9:30 PM  Result Value Ref Range Status   Enterococcus faecalis NOT DETECTED NOT DETECTED Final   Enterococcus Faecium NOT DETECTED NOT DETECTED Final   Listeria monocytogenes NOT DETECTED NOT DETECTED Final   Staphylococcus species NOT DETECTED NOT DETECTED Final   Staphylococcus aureus (BCID) NOT DETECTED NOT DETECTED Final   Staphylococcus epidermidis NOT DETECTED NOT DETECTED Final   Staphylococcus lugdunensis NOT DETECTED NOT DETECTED Final   Streptococcus species NOT DETECTED NOT DETECTED Final   Streptococcus agalactiae NOT DETECTED NOT DETECTED Final   Streptococcus pneumoniae NOT DETECTED NOT DETECTED Final   Streptococcus pyogenes NOT DETECTED NOT DETECTED Final   A.calcoaceticus-baumannii NOT DETECTED NOT DETECTED Final   Bacteroides fragilis NOT DETECTED NOT DETECTED Final   Enterobacterales PENDING NOT DETECTED Incomplete   Enterobacter cloacae complex NOT DETECTED NOT DETECTED Final   Escherichia coli DETECTED (A) NOT DETECTED Final    Comment: CRITICAL RESULT CALLED TO, READ BACK BY AND VERIFIED WITH: PHARMD K PIERCE [id number] AT 1156 AM BY  CM    Klebsiella aerogenes NOT DETECTED NOT DETECTED Final   Klebsiella oxytoca NOT DETECTED NOT DETECTED Final   Klebsiella pneumoniae NOT DETECTED NOT DETECTED Final   Proteus species NOT DETECTED NOT DETECTED Final   Salmonella species NOT DETECTED NOT DETECTED Final   Serratia marcescens NOT DETECTED NOT DETECTED Final   Haemophilus influenzae NOT DETECTED NOT DETECTED Final   Neisseria meningitidis NOT DETECTED NOT DETECTED Final   Pseudomonas aeruginosa NOT DETECTED NOT DETECTED Final   Stenotrophomonas maltophilia NOT DETECTED NOT DETECTED Final   Candida albicans NOT DETECTED NOT DETECTED Final   Candida auris NOT DETECTED NOT DETECTED Final   Candida glabrata NOT DETECTED NOT DETECTED Final   Candida krusei NOT DETECTED NOT DETECTED Final   Candida parapsilosis NOT DETECTED NOT DETECTED Final   Candida tropicalis NOT DETECTED NOT DETECTED Final   Cryptococcus neoformans/gattii NOT DETECTED NOT DETECTED Final   CTX-M ESBL DETECTED (A) NOT DETECTED Final    Comment: CRITICAL RESULT CALLED TO, READ BACK BY AND VERIFIED WITH: PHARMD K PIERCE [id number] AT 1156 AM BY CM (NOTE) Extended spectrum beta-lactamase detected. Recommend a carbapenem as initial therapy.      Carbapenem resistance IMP NOT DETECTED NOT DETECTED Final   Carbapenem resistance KPC NOT DETECTED NOT DETECTED Final   Carbapenem resistance NDM NOT DETECTED NOT DETECTED Final   Carbapenem resist OXA 48 LIKE NOT DETECTED NOT DETECTED Final   Carbapenem resistance VIM NOT DETECTED NOT DETECTED Final    Comment: Performed at [facility], [place] [address] [place], [place] [address]  MRSA Next Gen by PCR, Nasal     Status: None   Collection Time: 04/30/22 10:10 PM   Specimen: Nasal Mucosa; Nasal Swab  Result Value Ref Range Status   MRSA by PCR Next Gen NOT DETECTED NOT DETECTED Final    Comment: (NOTE) The GeneXpert MRSA Assay (FDA approved for NASAL specimens only), is one component of a  comprehensive MRSA  colonization surveillance program. It is not intended to diagnose MRSA infection nor to guide or monitor treatment for MRSA infections. Test performance is not FDA approved in patients less than 58 years old. Performed at [facility], [place] [address] [place], [place] [address]      Radiology Studies: CT Head Wo Contrast  Result Date: 04/30/2022 CLINICAL DATA:  Altered mental status. EXAM: CT HEAD WITHOUT CONTRAST TECHNIQUE: Contiguous axial images were obtained from the base of the skull through the vertex without intravenous contrast. RADIATION DOSE REDUCTION: This exam was performed according to the departmental dose-optimization program which includes automated exposure control, adjustment of the mA and/or kV according to patient size and/or use of iterative reconstruction technique. COMPARISON:  November 13, 2012 FINDINGS: Brain: There is mild cerebral atrophy with widening of the extra-axial spaces and ventricular dilatation. There are areas of decreased attenuation within the white matter tracts of the supratentorial brain, consistent with microvascular disease changes. Small, chronic bilateral basal ganglia lacunar infarcts are noted. Vascular: No hyperdense vessel or unexpected calcification. Skull: Normal. Negative for fracture or focal lesion. Sinuses/Orbits: No acute finding. Other: None. IMPRESSION: 1. No acute intracranial abnormality. 2. Generalized cerebral atrophy and microvascular disease changes of the supratentorial brain. 3. Small, chronic bilateral basal ganglia lacunar infarcts. Electronically Signed   By: Virgina Norfolk M.D.   On: 04/30/2022 22:10   DG Chest Port 1 View  Result Date: 04/30/2022 CLINICAL DATA:  Shortness of breath and altered mental status. EXAM: PORTABLE CHEST 1 VIEW COMPARISON:  March 29, 2022 FINDINGS: The cardiac silhouette is mildly enlarged and unchanged in size. Mild, diffuse, chronic appearing increased interstitial lung markings are noted.  Mild to moderate severity areas of atelectasis and/or infiltrate are seen within the bilateral lung bases. There is a small left pleural effusion. No pneumothorax is identified. Multilevel degenerative changes seen throughout the thoracic spine. IMPRESSION: 1. Stable cardiomegaly with mild to moderate severity bibasilar atelectasis and/or infiltrate. 2. Small left pleural effusion. Electronically Signed   By: Virgina Norfolk M.D.   On: 04/30/2022 21:30    Scheduled Meds:  amiodarone  200 mg Oral Daily   feeding supplement  237 mL Oral BID BM   latanoprost  1 drop Both Eyes QHS   midodrine  5 mg Oral TID WC   mometasone-formoterol  2 puff Inhalation BID   pantoprazole  40 mg Oral Daily   predniSONE  5 mg Oral Q breakfast   rivaroxaban  20 mg Oral Q supper   saccharomyces boulardii  250 mg Oral Daily   sodium chloride flush  3 mL Intravenous Q12H   Continuous Infusions:  sodium chloride 10 mL/hr at 05/01/22 1417   sodium chloride 100 mL/hr at 05/02/22 0150   magnesium sulfate bolus IVPB     meropenem (MERREM) IV 1 g (05/01/22 2040)     LOS: 2 days   Shelly Coss, MD [facility] P10/02/2022, 10:33 AM

## 2022-05-03 LAB — CULTURE, BLOOD (ROUTINE X 2)
Special Requests: ADEQUATE
Special Requests: ADEQUATE

## 2022-05-03 LAB — CBC
HCT: 33.9 % — ABNORMAL LOW (ref 36.0–46.0)
Hemoglobin: 11.3 g/dL — ABNORMAL LOW (ref 12.0–15.0)
MCH: 31 pg (ref 26.0–34.0)
MCHC: 33.3 g/dL (ref 30.0–36.0)
MCV: 92.9 fL (ref 80.0–100.0)
Platelets: 208 10*3/uL (ref 150–400)
RBC: 3.65 MIL/uL — ABNORMAL LOW (ref 3.87–5.11)
RDW: 15.2 % (ref 11.5–15.5)
WBC: 12.2 10*3/uL — ABNORMAL HIGH (ref 4.0–10.5)
nRBC: 0 % (ref 0.0–0.2)

## 2022-05-03 LAB — MAGNESIUM: Magnesium: 1.9 mg/dL (ref 1.7–2.4)

## 2022-05-03 NOTE — Evaluation (Signed)
Physical Therapy Evaluation Patient Details Name: Stacey Davenport MRN: [id number] DOB: 08-14-1940 Today's Date: 05/03/2022  History of Present Illness  81 yo female presents to [facility] from [facility] SNF on 10/6 with ShOB, AMS, workup for Severe sepsis/E. coli bacteremia secondary to pneumonia. PMH: dementia, HTN, HLD, asthma, PAF, hiatal hernia, OA, CVA, glaucoma.  Clinical Impression   Pt presents with generalized weakness, impaired balance, max difficulty perform bed mobility/transfers, and decreased activity tolerance. Pt to benefit from acute PT to address deficits. Pt requiring max +2 assist for transfer into standing, pt with heavily forward flexed trunk. PT recommending return to SNF level of care post-acutely. PT to progress mobility as tolerated, and will continue to follow acutely.         Recommendations for follow up therapy are one component of a multi-disciplinary discharge planning process, led by the attending physician.  Recommendations may be updated based on patient status, additional functional criteria and insurance authorization.  Follow Up Recommendations Skilled nursing-short term rehab (<3 hours/day) Can patient physically be transported by private vehicle: No    Assistance Recommended at Discharge Frequent or constant Supervision/Assistance  Patient can return home with the following  Two people to help with walking and/or transfers;Two people to help with bathing/dressing/bathroom;Direct supervision/assist for financial management;Direct supervision/assist for medications management    Equipment Recommendations None recommended by PT  Recommendations for Other Services       Functional Status Assessment Patient has had a recent decline in their functional status and demonstrates the ability to make significant improvements in function in a reasonable and predictable amount of time.     Precautions / Restrictions Precautions Precautions: Fall Restrictions Weight  Bearing Restrictions: No      Mobility  Bed Mobility Overal bed mobility: Needs Assistance Bed Mobility: Sit to Supine       Sit to supine: Mod assist, +2 for physical assistance, HOB elevated   General bed mobility comments: sitting EOB with OT upon PT arrival, assist for LE progression into bed, trunk lowering, and boost up in bed.    Transfers Overall transfer level: Needs assistance Equipment used: 2 person hand held assist Transfers: Sit to/from Stand, Bed to chair/wheelchair/BSC Sit to Stand: Max assist, +2 physical assistance   Step pivot transfers: Max assist, +2 physical assistance       General transfer comment: max +2 for power up, rise, hip extension to upright, and steadying once standing. Pt took x2 steps towards [facility] with support and very forward flexed posture    Ambulation/Gait               General Gait Details: unable  Stairs            Wheelchair Mobility    Modified Rankin (Stroke Patients Only)       Balance Overall balance assessment: Needs assistance Sitting-balance support: No upper extremity supported, Feet supported Sitting balance-Leahy Scale: Fair     Standing balance support: Bilateral upper extremity supported, During functional activity Standing balance-Leahy Scale: Zero Standing balance comment: max +2                             Pertinent Vitals/Pain Pain Assessment Pain Assessment: Faces Faces Pain Scale: Hurts a little bit Pain Location: chest, from coughing Pain Descriptors / Indicators: Discomfort Pain Intervention(s): Limited activity within patient's tolerance, Monitored during session, Repositioned    Home Living Family/patient expects to be discharged to:: Skilled nursing facility  Prior Function Prior Level of Function : Needs assist;Patient poor historian/Family not available             Mobility Comments: pt reports working on walking at SNF, uses  RW       Hand Dominance   Dominant Hand: Right    Extremity/Trunk Assessment   Upper Extremity Assessment Upper Extremity Assessment: Defer to OT evaluation    Lower Extremity Assessment Lower Extremity Assessment: Generalized weakness    Cervical / Trunk Assessment Cervical / Trunk Assessment: Other exceptions;Kyphotic Cervical / Trunk Exceptions: scoliosis  Communication   Communication: HOH (soft spoken)  Cognition Arousal/Alertness: Awake/alert Behavior During Therapy: WFL for tasks assessed/performed Overall Cognitive Status: History of cognitive impairments - at baseline                                 General Comments: history of dementia. Pleasantly confused, oriented to self and aspects of situation. Follows all one-step commands appropriately        General Comments General comments (skin integrity, edema, etc.): vss throughout on RA    Exercises General Exercises - Lower Extremity Ankle Circles/Pumps: AROM, Both, Supine, 15 reps Heel Slides: Both, 15 reps, AAROM, Supine Hip ABduction/ADduction: AAROM, Both, 15 reps, Supine   Assessment/Plan    PT Assessment Patient needs continued PT services  PT Problem List Decreased strength;Decreased mobility;Decreased safety awareness;Decreased activity tolerance;Decreased balance;Decreased knowledge of use of DME;Pain;Cardiopulmonary status limiting activity       PT Treatment Interventions DME instruction;Therapeutic activities;Gait training;Therapeutic exercise;Patient/family education;Balance training;Functional mobility training;Neuromuscular re-education    PT Goals (Current goals can be found in the Care Plan section)  Acute Rehab PT Goals Patient Stated Goal: back to [facility] PT Goal Formulation: With patient Time For Goal Achievement: 05/17/22 Potential to Achieve Goals: Good    Frequency Min 2X/week     Co-evaluation               AM-PAC PT "6 Clicks" Mobility  Outcome  Measure Help needed turning from your back to your side while in a flat bed without using bedrails?: A Lot Help needed moving from lying on your back to sitting on the side of a flat bed without using bedrails?: A Lot Help needed moving to and from a bed to a chair (including a wheelchair)?: Total Help needed standing up from a chair using your arms (e.g., wheelchair or bedside chair)?: Total Help needed to walk in hospital room?: Total Help needed climbing 3-5 steps with a railing? : Total 6 Click Score: 8    End of Session   Activity Tolerance: Patient tolerated treatment well Patient left: in bed;with call bell/phone within reach;with bed alarm set Nurse Communication: Mobility status PT Visit Diagnosis: Other abnormalities of gait and mobility (R26.89);Muscle weakness (generalized) (M62.81);Difficulty in walking, not elsewhere classified (R26.2)    Time: 3557-3220 PT Time Calculation (min) (ACUTE ONLY): 20 min   Charges:   PT Evaluation $PT Eval Low Complexity: 1 Low         Jaquavious Mercer S, PT DPT Acute Rehabilitation Services Pager [phone]  Office [phone]   [place] Ruffin Pyo 05/03/2022, 3:04 PM

## 2022-05-03 NOTE — Evaluation (Addendum)
Occupational Therapy Evaluation Patient Details Name: Stacey Davenport MRN: [id number] DOB: [birth year] Today's Date: 05/03/2022   History of Present Illness 81 yo female presents to [facility] from [facility]F on 10/6 with ShOB, AMS, workup for Severe sepsis/E. coli bacteremia secondary to pneumonia. PMH: dementia, HTN, HLD, asthma, PAF, hiatal hernia, OA, CVA, glaucoma.   Clinical Impression   Prior to this admission, patient residing at [facility] for rehab. Currently, patient is pleasantly confused, mod A for ADLs, and max A of 2 to come into standing, with significant forward flexed trunk. Patient also with decreased activity tolerance and generalized weakness. OT recommending return to SNF when ready for discharge. OT will continue to follow.      Recommendations for follow up therapy are one component of a multi-disciplinary discharge planning process, led by the attending physician.  Recommendations may be updated based on patient status, additional functional criteria and insurance authorization.   Follow Up Recommendations  Skilled nursing-short term rehab (<3 hours/day)    Assistance Recommended at Discharge Frequent or constant Supervision/Assistance  Patient can return home with the following Two people to help with walking and/or transfers;A lot of help with bathing/dressing/bathroom;Assistance with cooking/housework;Direct supervision/assist for medications management;Direct supervision/assist for financial management;Assist for transportation;Help with stairs or ramp for entrance    Functional Status Assessment  Patient has had a recent decline in their functional status and demonstrates the ability to make significant improvements in function in a reasonable and predictable amount of time.  Equipment Recommendations  None recommended by OT (Defer to next venue)    Recommendations for Other Services       Precautions / Restrictions Precautions Precautions:  Fall Restrictions Weight Bearing Restrictions: No      Mobility Bed Mobility Overal bed mobility: Needs Assistance Bed Mobility: Sit to Supine, Supine to Sit     Supine to sit: Mod assist Sit to supine: Mod assist, +2 for physical assistance, HOB elevated   General bed mobility comments: Able to complete sitting EOB x2 with mod A from OT, PT assisting for LE progression back into bed, trunk lowering, and boost up in bed.    Transfers Overall transfer level: Needs assistance Equipment used: 2 person hand held assist Transfers: Sit to/from Stand, Bed to chair/wheelchair/BSC Sit to Stand: Max assist, +2 physical assistance     Step pivot transfers: Max assist, +2 physical assistance     General transfer comment: max +2 for power up, rise, hip extension to upright, and steadying once standing. Pt took x2 steps towards [facility] with support and very forward flexed posture      Balance Overall balance assessment: Needs assistance Sitting-balance support: No upper extremity supported, Feet supported Sitting balance-Leahy Scale: Fair     Standing balance support: Bilateral upper extremity supported, During functional activity Standing balance-Leahy Scale: Zero Standing balance comment: max +2                           ADL either performed or assessed with clinical judgement   ADL Overall ADL's : Needs assistance/impaired Eating/Feeding: Set up;Sitting   Grooming: Minimal assistance;Sitting   Upper Body Bathing: Minimal assistance;Moderate assistance;Sitting   Lower Body Bathing: Maximal assistance;Total assistance;Sit to/from stand;Sitting/lateral leans   Upper Body Dressing : Minimal assistance;Moderate assistance;Sitting   Lower Body Dressing: Maximal assistance;Total assistance;Sitting/lateral leans;Sit to/from stand   Toilet Transfer: Stand-pivot;Total assistance;BSC/3in1 Toilet Transfer Details (indicate cue type and reason): simulated with sit<>stands,  unable to complete face to face  transfer with OT only         Functional mobility during ADLs: Moderate assistance;Cueing for safety;Cueing for sequencing General ADL Comments: Patient presenting with decreased activity tolerance, pleasantly confused, and needing increased assist with ADLs and transfers     Vision Baseline Vision/History: 1 Wears glasses Ability to See in Adequate Light: 0 Adequate Patient Visual Report: No change from baseline       Perception     Praxis      Pertinent Vitals/Pain Pain Assessment Pain Assessment: Faces Faces Pain Scale: Hurts a little bit Pain Location: chest, from coughing Pain Descriptors / Indicators: Discomfort Pain Intervention(s): Limited activity within patient's tolerance, Monitored during session, Repositioned     Hand Dominance Right   Extremity/Trunk Assessment Upper Extremity Assessment Upper Extremity Assessment: Generalized weakness   Lower Extremity Assessment Lower Extremity Assessment: Defer to PT evaluation   Cervical / Trunk Assessment Cervical / Trunk Assessment: Other exceptions;Kyphotic Cervical / Trunk Exceptions: scoliosis   Communication Communication Communication: HOH (soft spoken)   Cognition Arousal/Alertness: Awake/alert Behavior During Therapy: WFL for tasks assessed/performed Overall Cognitive Status: History of cognitive impairments - at baseline                                 General Comments: history of dementia. Pleasantly confused, oriented to self and aspects of situation. Follows all one-step commands appropriately     General Comments  VSS on RA    Exercises     Shoulder Instructions      Home Living Family/patient expects to be discharged to:: Skilled nursing facility                                        Prior Functioning/Environment Prior Level of Function : Needs assist;Patient poor historian/Family not available             Mobility  Comments: pt reports working on walking at [facility], uses RW ADLs Comments: assisted for showering several times a week, pt reports she sponge bathes in between seated at sink, facillity does all IADLs.        OT Problem List: Decreased strength;Decreased activity tolerance;Impaired balance (sitting and/or standing);Decreased coordination;Decreased safety awareness      OT Treatment/Interventions: Self-care/ADL training;Therapeutic exercise;Energy conservation;DME and/or AE instruction;Manual therapy;Patient/family education;Balance training;Cognitive remediation/compensation;Therapeutic activities    OT Goals(Current goals can be found in the care plan section) Acute Rehab OT Goals Patient Stated Goal: to feel better OT Goal Formulation: With patient Time For Goal Achievement: 05/17/22 Potential to Achieve Goals: Fair ADL Goals Pt Will Perform Lower Body Bathing: with min assist;sit to/from stand;sitting/lateral leans Pt Will Perform Lower Body Dressing: with min assist;sitting/lateral leans;sit to/from stand Pt Will Transfer to Toilet: with mod assist;stand pivot transfer;bedside commode Pt Will Perform Toileting - Clothing Manipulation and hygiene: with min guard assist;sitting/lateral leans;sit to/from stand  OT Frequency: Min 2X/week    Co-evaluation              AM-PAC OT "6 Clicks" Daily Activity     Outcome Measure Help from another person eating meals?: A Little Help from another person taking care of personal grooming?: A Little Help from another person toileting, which includes using toliet, bedpan, or urinal?: A Lot Help from another person bathing (including washing, rinsing, drying)?: A Lot Help from another person to put on and  taking off regular upper body clothing?: A Little Help from another person to put on and taking off regular lower body clothing?: A Lot 6 Click Score: 15   End of Session Nurse Communication: Mobility status  Activity Tolerance: Patient  tolerated treatment well Patient left: in bed;with call bell/phone within reach;Other (comment) (with PT completing exercises with patient)  OT Visit Diagnosis: Unsteadiness on feet (R26.81);Other abnormalities of gait and mobility (R26.89);Muscle weakness (generalized) (M62.81)                Time: 1420-1440 OT Time Calculation (min): 20 min Charges:  OT General Charges $OT Visit: 1 Visit OT Evaluation $OT Eval Moderate Complexity: 1 Mod  Corinne Ports E. Hyland Mollenkopf, OTR/L Acute Rehabilitation Services [phone]   Ascencion Dike 05/03/2022, 3:28 PM

## 2022-05-03 NOTE — Progress Notes (Signed)
PROGRESS NOTE  Stacey Davenport  NFA:[id number] DOB: 09-20-1940 DOA: 04/30/2022 PCP: Janifer Adie, MD   Brief Narrative:  Patient is a 81 year old female with history of asthma, hypertension, GERD, atrial fibrillation, arthritis, glaucoma, hyperlipidemia, CVA, hypothyroidism, depression, who presented from SNF with complaints of shortness of breath, altered mentation.  She was found to be hypoxic at her facility and was requiring supplemental oxygen, noted to be less aware than her baseline.  On presentation, she was hypotensive with systolic blood pressure in the range of 80s to 90s, required 3 L of oxygen.  Lab work showed potassium of 2.9, WBC count of 20.1, lactic acid of 1.5, lipase level normal.  Procalcitonin elevated.  Chest x-ray stable cardiomegaly with mild to moderate atelectasis versus infiltrate.  CT head did not show any acute intracranial abnormalities.  Patient was started on broad-spectrum antibiotics for suspicion of sepsis from pneumonia.  Blood cultures showed ESBL E. coli, ID consulted.  Sepsis physiology has significantly improved.  ID recommended to continue ertapenem through 05/15/2022, getting midline.  Plan for discharge tomorrow.  PT/OT consulted  Assessment & Plan:  Principal Problem:   Severe sepsis ([place]) Active Problems:   Hypertension   Asthma, chronic, steroid-dependent   GERD   Rheumatoid arthritis ([place])   Atrial fibrillation ([facility])   Hyperlipidemia   History of CVA (cerebrovascular accident)   Hypothyroidism   Depression   Chronic diastolic CHF (congestive heart failure) ([place])   Bacteremia   Severe sepsis/E. coli bacteremia secondary to pneumonia: Presented with altered mentation, hypoxia, hypotension, leukocytosis.  Elevated procalcitonin.  Blood cultures showed ESBL E. coli .currently on ertapenem .ID where following.  Sepsis  physiology is better.  Afebrile, blood pressure better today.  Pneumonia: Chest x-ray showed Stable cardiomegaly with mild to  moderate severity bibasilar atelectasis and/or infiltrate.  Continue current antibiotics for now.  Respiratory status stable, room air  Hypomagnesemia: Supplemented  and corrected  Paroxysmal A-fib: Monitor on telemetry continue.  Rate controlled amiodarone, Xarelto.  This morning she was in normal sinus rhythm.  Chronic hypoxic respiratory failure: Recently admitted and was discharged from [facility] on 04/02/2022 after she was treated for COVID-pneumonia with steroids, remdesivir.  Hospital course was also complicated with aspiration pneumonia.  She was discharged on 2 L of oxygen with activity.  Asthma: Continue home prednisone, inhaler, as needed Xopenex  Dementia: Confused at baseline.  Found to be more confused at the skilled nursing facility most likely secondary to pneumonia/bacteremia.  Continue delirium precautions, frequent reorientation.  Mentation has improved now.  Hypertension: Currently blood pressure soft.  Home Lasix on hold  GERD: Continue PPI  Rheumatoid arthritis: Continue home steroid  Glaucoma: Continue home eyedrops  History of hyperlipidemia/CVA: Currently not on medications  Hypothyroidism: Continue Synthyroid  dCHF: Has history of grade 2 diastolic dysfunction with normal ejection fraction as per the last echo.  On Lasix at home, currently on hold.  She looked dehydrated and was hypotensive so started on IV fluids. IV fluid stopped now     Pressure Injury 05/01/22 Heel Left Stage 2 -  Partial thickness loss of dermis presenting as a shallow open injury with a red, pink wound bed without slough. (Active)  05/01/22 1000  Location: Heel  Location Orientation: Left  Staging: Stage 2 -  Partial thickness loss of dermis presenting as a shallow open injury with a red, pink wound bed without slough.  Wound Description (Comments):   Present on Admission: Yes  Dressing Type Foam - Lift dressing  to assess site every shift 05/02/22 2256     Pressure Injury 05/01/22  Heel Right Stage 1 -  Intact skin with non-blanchable redness of a localized area usually over a bony prominence. (Active)  05/01/22 1000  Location: Heel  Location Orientation: Right  Staging: Stage 1 -  Intact skin with non-blanchable redness of a localized area usually over a bony prominence.  Wound Description (Comments):   Present on Admission: Yes  Dressing Type Foam - Lift dressing to assess site every shift 05/02/22 2256    DVT prophylaxis: rivaroxaban (XARELTO) tablet 20 mg     Code Status: DNR  Family Communication: Called and discussed with son on phone on 10/9  Patient status:Inpatient  Patient is from :SNF  Anticipated discharge to:SNF  Estimated DC date:tomorrow   Consultants:ID  Procedures:None  Antimicrobials:  Anti-infectives (From admission, onward)    Start     Dose/Rate Route Frequency Ordered Stop   05/01/22 2300  vancomycin (VANCOCIN) IVPB 1000 mg/200 mL premix  Status:  Discontinued        1,000 mg 200 mL/hr over 60 Minutes Intravenous Every 24 hours 04/30/22 2248 05/01/22 1217   05/01/22 1300  meropenem (MERREM) 1 g in sodium chloride 0.9 % 100 mL IVPB        1 g 200 mL/hr over 30 Minutes Intravenous Every 12 hours 05/01/22 1209     05/01/22 0400  piperacillin-tazobactam (ZOSYN) IVPB 3.375 g  Status:  Discontinued        3.375 g 12.5 mL/hr over 240 Minutes Intravenous Every 8 hours 04/30/22 2348 05/01/22 1209   04/30/22 2215  piperacillin-tazobactam (ZOSYN) IVPB 3.375 g        3.375 g 100 mL/hr over 30 Minutes Intravenous  Once 04/30/22 2204 04/30/22 2250   04/30/22 2215  vancomycin (VANCOREADY) IVPB 1500 mg/300 mL        1,500 mg 150 mL/hr over 120 Minutes Intravenous  Once 04/30/22 2209 05/01/22 0037       Subjective: Patient seen and examined at bedside today.  She looks comfortable.  On room air.  Alert and awake and mostly oriented.  Denies any shortness of breath or cough.  Objective: Vitals:   05/02/22 2205 05/02/22 2323 05/03/22  0809 05/03/22 0828  BP:  (!) 152/66  116/75  Pulse:  64  67  Resp:  20  16  Temp:  98 F (36.7 C)  97.8 F (36.6 C)  TempSrc:  Oral  Oral  SpO2: 99% 93% 99% 95%  Weight:      Height:        Intake/Output Summary (Last 24 hours) at 05/03/2022 1334 Last data filed at 05/03/2022 1300 Gross per 24 hour  Intake 560.83 ml  Output 1100 ml  Net -539.17 ml   Filed Weights   04/30/22 2043  Weight: 65.9 kg    Examination:   General exam: Overall comfortable, not in distress, very deconditioned elderly female HEENT: PERRL Respiratory system:  no wheezes or crackles  Cardiovascular system: S1 & S2 heard, RRR.  Gastrointestinal system: Abdomen is nondistended, soft and nontender. Central nervous system: Alert and oriented Extremities: No edema, no clubbing ,no cyanosis Skin: No rashes, no ulcers,no icterus     Data Reviewed: I have personally reviewed following labs and imaging studies  CBC: Recent Labs  Lab 04/30/22 2125 04/30/22 2244 05/01/22 0211 05/02/22 0311 05/03/22 0322  WBC 20.1*  --  25.2* 21.1* 12.2*  NEUTROABS 17.5*  --   --   --   --  HGB 13.9 12.6 12.1 10.5* 11.3*  HCT 42.5 37.0 37.2 33.1* 33.9*  MCV 96.2  --  97.4 97.4 92.9  PLT 231  --  205 198 294   Basic Metabolic Panel: Recent Labs  Lab 04/30/22 2125 04/30/22 2244 05/01/22 0211 05/02/22 0311 05/03/22 0322  NA 139 139 141 140  --   K 2.9* 2.8* 3.1* 4.2  --   CL 105  --  117* 115*  --   CO2 22  --  19* 20*  --   GLUCOSE 101*  --  99 98  --   BUN 10  --  9 14  --   CREATININE 0.82  --  0.67 0.68  --   CALCIUM 8.3*  --  6.4* 7.9*  --   MG  --   --   --  1.3* 1.9     Recent Results (from the past 240 hour(s))  SARS Coronavirus 2 by RT PCR (hospital order, performed in [facility] hospital lab) *cepheid single result test*     Status: None   Collection Time: 04/30/22  8:48 PM   Specimen: Nasal Swab  Result Value Ref Range Status   SARS Coronavirus 2 by RT PCR NEGATIVE NEGATIVE Final     Comment: (NOTE) SARS-CoV-2 target nucleic acids are NOT DETECTED.  The SARS-CoV-2 RNA is generally detectable in upper and lower respiratory specimens during the acute phase of infection. The lowest concentration of SARS-CoV-2 viral copies this assay can detect is 250 copies / mL. A negative result does not preclude SARS-CoV-2 infection and should not be used as the sole basis for treatment or other patient management decisions.  A negative result may occur with improper specimen collection / handling, submission of specimen other than nasopharyngeal swab, presence of viral mutation(s) within the areas targeted by this assay, and inadequate number of viral copies (<250 copies / mL). A negative result must be combined with clinical observations, patient history, and epidemiological information.  Fact Sheet for Patients:   https://www.patel.info/  Fact Sheet for Healthcare Providers: https://hall.com/  This test is not yet approved or  cleared by the [place] FDA and has been authorized for detection and/or diagnosis of SARS-CoV-2 by FDA under an Emergency Use Authorization (EUA).  This EUA will remain in effect (meaning this test can be used) for the duration of the COVID-19 declaration under Section 564(b)(1) of the Act, 21 U.S.C. section 360bbb-3(b)(1), unless the authorization is terminated or revoked sooner.  Performed at [facility], [place] [address], [place], [place] [address]   Culture, blood (Routine X 2) w Reflex to ID Panel     Status: Abnormal   Collection Time: 04/30/22  9:15 PM   Specimen: BLOOD  Result Value Ref Range Status   Specimen Description BLOOD BLOOD RIGHT FOREARM  Final   Special Requests   Final    BOTTLES DRAWN AEROBIC AND ANAEROBIC Blood Culture adequate volume   Culture  Setup Time   Final    GRAM NEGATIVE RODS IN BOTH AEROBIC AND ANAEROBIC BOTTLES CRITICAL VALUE NOTED.  VALUE IS CONSISTENT WITH  PREVIOUSLY REPORTED AND CALLED VALUE.    Culture (A)  Final    ESCHERICHIA COLI SUSCEPTIBILITIES PERFORMED ON PREVIOUS CULTURE WITHIN THE LAST 5 DAYS. Performed at [facility], [place] [address] [place], [place] [address]    Report Status 05/03/2022 FINAL  Final  Culture, blood (Routine X 2) w Reflex to ID Panel     Status: Abnormal  Collection Time: 04/30/22  9:30 PM   Specimen: BLOOD  Result Value Ref Range Status   Specimen Description BLOOD BLOOD LEFT FOREARM  Final   Special Requests   Final    BOTTLES DRAWN AEROBIC AND ANAEROBIC Blood Culture adequate volume   Culture  Setup Time   Final    GRAM NEGATIVE RODS IN BOTH AEROBIC AND ANAEROBIC BOTTLES CRITICAL RESULT CALLED TO, READ BACK BY AND VERIFIED WITH: PHARMD K PIERCE [id number] AT 1610 AM BY CM Performed at [facility], [place] [address], [place], [place] [address]    Culture (A)  Final    ESCHERICHIA COLI Confirmed Extended Spectrum Beta-Lactamase Producer (ESBL).  In bloodstream infections from ESBL organisms, carbapenems are preferred over piperacillin/tazobactam. They are shown to have a lower risk of mortality.    Report Status 05/03/2022 FINAL  Final   Organism ID, Bacteria ESCHERICHIA COLI  Final      Susceptibility   Escherichia coli - MIC*    AMPICILLIN >=32 RESISTANT Resistant     CEFAZOLIN >=64 RESISTANT Resistant     CEFEPIME >=32 RESISTANT Resistant     CEFTAZIDIME >=64 RESISTANT Resistant     CEFTRIAXONE >=64 RESISTANT Resistant     CIPROFLOXACIN >=4 RESISTANT Resistant     GENTAMICIN <=1 SENSITIVE Sensitive     IMIPENEM <=0.25 SENSITIVE Sensitive     TRIMETH/SULFA >=320 RESISTANT Resistant     AMPICILLIN/SULBACTAM >=32 RESISTANT Resistant     PIP/TAZO 16 SENSITIVE Sensitive     * ESCHERICHIA COLI  Blood Culture ID Panel (Reflexed)     Status: Abnormal (Preliminary result)   Collection Time: 04/30/22  9:30 PM  Result Value Ref Range Status   Enterococcus faecalis NOT DETECTED NOT DETECTED Final    Enterococcus Faecium NOT DETECTED NOT DETECTED Final   Listeria monocytogenes NOT DETECTED NOT DETECTED Final   Staphylococcus species NOT DETECTED NOT DETECTED Final   Staphylococcus aureus (BCID) NOT DETECTED NOT DETECTED Final   Staphylococcus epidermidis NOT DETECTED NOT DETECTED Final   Staphylococcus lugdunensis NOT DETECTED NOT DETECTED Final   Streptococcus species NOT DETECTED NOT DETECTED Final   Streptococcus agalactiae NOT DETECTED NOT DETECTED Final   Streptococcus pneumoniae NOT DETECTED NOT DETECTED Final   Streptococcus pyogenes NOT DETECTED NOT DETECTED Final   A.calcoaceticus-baumannii NOT DETECTED NOT DETECTED Final   Bacteroides fragilis NOT DETECTED NOT DETECTED Final   Enterobacterales PENDING NOT DETECTED Incomplete   Enterobacter cloacae complex NOT DETECTED NOT DETECTED Final   Escherichia coli DETECTED (A) NOT DETECTED Final    Comment: CRITICAL RESULT CALLED TO, READ BACK BY AND VERIFIED WITH: PHARMD K PIERCE [id number] AT 1156 AM BY CM    Klebsiella aerogenes NOT DETECTED NOT DETECTED Final   Klebsiella oxytoca NOT DETECTED NOT DETECTED Final   Klebsiella pneumoniae NOT DETECTED NOT DETECTED Final   Proteus species NOT DETECTED NOT DETECTED Final   Salmonella species NOT DETECTED NOT DETECTED Final   Serratia marcescens NOT DETECTED NOT DETECTED Final   Haemophilus influenzae NOT DETECTED NOT DETECTED Final   Neisseria meningitidis NOT DETECTED NOT DETECTED Final   Pseudomonas aeruginosa NOT DETECTED NOT DETECTED Final   Stenotrophomonas maltophilia NOT DETECTED NOT DETECTED Final   Candida albicans NOT DETECTED NOT DETECTED Final   Candida auris NOT DETECTED NOT DETECTED Final   Candida glabrata NOT DETECTED NOT DETECTED Final   Candida krusei NOT DETECTED NOT DETECTED Final   Candida parapsilosis NOT DETECTED NOT DETECTED Final   Candida tropicalis NOT DETECTED NOT  DETECTED Final   Cryptococcus neoformans/gattii NOT DETECTED NOT DETECTED Final   CTX-M  ESBL DETECTED (A) NOT DETECTED Final    Comment: CRITICAL RESULT CALLED TO, READ BACK BY AND VERIFIED WITH: PHARMD K PIERCE [id number] AT 1156 AM BY CM (NOTE) Extended spectrum beta-lactamase detected. Recommend a carbapenem as initial therapy.      Carbapenem resistance IMP NOT DETECTED NOT DETECTED Final   Carbapenem resistance KPC NOT DETECTED NOT DETECTED Final   Carbapenem resistance NDM NOT DETECTED NOT DETECTED Final   Carbapenem resist OXA 48 LIKE NOT DETECTED NOT DETECTED Final   Carbapenem resistance VIM NOT DETECTED NOT DETECTED Final    Comment: Performed at [facility], [place] [address], [place], [place] [address]  MRSA Next Gen by PCR, Nasal     Status: None   Collection Time: 04/30/22 10:10 PM   Specimen: Nasal Mucosa; Nasal Swab  Result Value Ref Range Status   MRSA by PCR Next Gen NOT DETECTED NOT DETECTED Final    Comment: (NOTE) The GeneXpert MRSA Assay (FDA approved for NASAL specimens only), is one component of a comprehensive MRSA colonization surveillance program. It is not intended to diagnose MRSA infection nor to guide or monitor treatment for MRSA infections. Test performance is not FDA approved in patients less than 64 years old. Performed at [facility], [place] [address] [place], [place] [address]      Radiology Studies: No results found.  Scheduled Meds:  amiodarone  200 mg Oral Daily   feeding supplement  237 mL Oral BID BM   latanoprost  1 drop Both Eyes QHS   midodrine  5 mg Oral TID WC   mometasone-formoterol  2 puff Inhalation BID   pantoprazole  40 mg Oral Daily   predniSONE  5 mg Oral Q breakfast   rivaroxaban  20 mg Oral Q supper   saccharomyces boulardii  250 mg Oral Daily   sodium chloride flush  3 mL Intravenous Q12H   Continuous Infusions:  sodium chloride 10 mL/hr at 05/02/22 1604   meropenem (MERREM) IV 1 g (05/03/22 1024)     LOS: 3 days   Shelly Coss, MD [facility] P10/03/2022, 1:34 PM

## 2022-05-03 NOTE — TOC Initial Note (Signed)
Transition of Care [facility]) - Initial/Assessment Note    Patient Details  Name: Stacey Davenport MRN: [id number] Date of Birth: 1941/01/20  Transition of Care [facility]) CM/SW Contact:    Joanne Chars, LCSW Phone Number: 05/03/2022, 3:56 PM  Clinical Narrative:    Pt oriented x1, CSW spoke by phone with son Stacey Davenport.  Pt has been LTC at [facility] through [facility] since May 2023.  Pt has stated to Stacey Davenport she wants to return and he supports this.  CSW agreed to update him tomorrow regarding time frame.                Expected Discharge Plan: Skilled Nursing Facility Barriers to Discharge: Continued Medical Work up   Patient Goals and CMS Choice     Choice offered to / list presented to : Adult Children (son Stacey Davenport)  Expected Discharge Plan and Services Expected Discharge Plan: [place] In-house Referral: Clinical Social Work   Post Acute Care Choice: [place] Living arrangements for the past 2 months: [place] (long term at [facility] through [employer])                                      Prior Living Arrangements/Services Living arrangements for the past 2 months: [place] (long term at [facility] through [employer]) Lives with:: Facility Resident          Need for Family Participation in Patient Care: Yes (Comment) Care giver support system in place?: Yes (comment) Current home services: Other (comment) (na) Criminal Activity/Legal Involvement Pertinent to Current Situation/Hospitalization: No - Comment as needed  Activities of Daily Living      Permission Sought/Granted                  Emotional Assessment Appearance:: Appears stated age Attitude/Demeanor/Rapport: Unable to Assess Affect (typically observed): Unable to Assess Orientation: : Oriented to Self      Admission diagnosis:  Healthcare-associated pneumonia [J18.9] Severe sepsis ([place]) [A41.9, R65.20] Sepsis, due to unspecified organism,  unspecified whether acute organ dysfunction present [facility] [A41.9] Patient Active Problem List   Diagnosis Date Noted   Bacteremia    Severe sepsis ([place]) 04/30/2022   Pneumonia due to COVID-19 virus 03/24/2022   Chronic diastolic CHF (congestive heart failure) ([place]) 03/24/2022   Leg swelling 11/30/2021   Mild protein-calorie malnutrition ([place]) 10/22/2021   Depression 10/22/2021   Elevated brain natriuretic peptide (BNP) level 10/22/2021   GI bleed 10/22/2021   Anticoagulated    Sagittal band rupture, extensor tendon, nontraumatic, left 12/30/2020   Irregular heart beat 08/15/2017   Primary open angle glaucoma of both eyes, mild stage 04/11/2017   High risk medication use 09/03/2013   Multiple drug allergies 09/03/2013   Osteoporosis, postmenopausal 05/23/2013   Hypothyroidism 04/23/2013   Hyperlipidemia 12/06/2012   History of CVA (cerebrovascular accident) 12/06/2012   Atrial fibrillation ([place]) 09/30/2011   Hypertension 08/20/2009   Glaucoma 04/17/2009   Asthma, chronic, steroid-dependent 04/17/2009   ESOPHAGEAL STRICTURE 04/17/2009   GERD 04/17/2009   Rheumatoid arthritis ([place]) 04/17/2009   PCP:  Janifer Adie, MD Pharmacy:   [facility] - [address] [place], [place] [address] Phone: [phone] Fax: [phone]  [facility] - [place], [place] - [address] [place] [address] Phone: [phone] Fax: [place], [place] [address] Phone: [phone] Fax: [phone]     Social Determinants of Health (SDOH) Interventions    Readmission Risk Interventions     No data to display

## 2022-05-03 NOTE — Progress Notes (Addendum)
[facility] Infectious Diseases Follow Up Note  Patient Identification: Patient Name: Stacey Davenport MRN: [id number] Admit Date: 04/30/2022  8:34 PM Age: 81 y.o.Today's Date: 05/03/2022  Reason for Visit: ESBL E. coli bacteremia  Principal Problem:   Severe sepsis ([place]) Active Problems:   Hypertension   Asthma, chronic, steroid-dependent   GERD   Rheumatoid arthritis ([place])   Atrial fibrillation (HCC)   Hyperlipidemia   History of CVA (cerebrovascular accident)   Hypothyroidism   Depression   Chronic diastolic CHF (congestive heart failure) ([place])   Bacteremia   Antibiotics:  Vancomycin 10/6 PIP Tazo 10/6-10/7, meropenem 10/7-c  Lines/Hardwares:   Interval Events: Continues to be afebrile, WBC is downtrending.  Clinically doing well   Assessment #ESBL E. coli bacteremia with unclear source-denies any GI and GU symptoms.  UA and urine culture not sent.  Clinically improving on IV meropenem  # Acute hypoxic respiratory failure with concern for pneumonia vs sepsis related , resolved # RA   Recommendations Plan to complete 14 days of treatment with meropenem for ESBL E. coli bacteremia of unclear source.  EOT 05/14/2022 Midline, to be removed after completion of IV antibiotics Will  not do any abdominal imaging as patient is improving with unremarkable abdominal exam Follow-up in ID clinic arranged.  ID will sign off for now.  Please call with questions  Rest of the management as per the primary team. Thank you for the consult. Please page with pertinent questions or concerns.  ______________________________________________________________________ Subjective patient seen and examined at the bedside.  She complains of fatigue/tiredness as a reason for hospital presentation.  Denies any nausea, vomiting or abdominal pain or diarrhea.  Denies any increased frequency of urination or burning.  Also had shortness of breath  which has resolved.  Denies any cough or chest pain.  She feels she is doing better. `    Past Medical History:  Diagnosis Date   Anxiety    Asthma    Atrial fibrillation (HCC)    CVA (cerebral infarction)    a. 09/2011 MRI tiny bilat centrum semiovale acute non hemorrhagic infarcts   Dementia (HCC)    mild per patient   Esophageal stricture    Gastric polyp    Hyperplastic   Gastritis    GERD (gastroesophageal reflux disease)    Glaucoma    Hiatal hernia    Hyperlipidemia    Hypertension    Hypothyroidism    Osteoarthritis    Pneumonia    Rheumatoid arthritis(714.0)    Thyroid disease    Past Surgical History:  Procedure Laterality Date   APPENDECTOMY     BIOPSY  10/22/2021   Procedure: BIOPSY;  Surgeon: Daneil Dolin, MD;  [facility];  Service: Endoscopy;;  gastric biopsy for H. pylori   BLEPHAROPLASTY Right    CATARACT EXTRACTION Bilateral    CHOLECYSTECTOMY     ESOPHAGOGASTRODUODENOSCOPY (EGD) WITH PROPOFOL N/A 10/22/2021   Procedure: ESOPHAGOGASTRODUODENOSCOPY (EGD) WITH PROPOFOL;  Surgeon: Daneil Dolin, MD;  Location[facility];  Service: Endoscopy;  Laterality: N/A;   HEMOSTASIS CLIP PLACEMENT  10/22/2021   Procedure: HEMOSTASIS CLIP PLACEMENT;  Surgeon: Daneil Dolin, MD;  Location[facility];  Service: Endoscopy;;   POLYPECTOMY  10/22/2021   Procedure: POLYPECTOMY;  Surgeon: Daneil Dolin, MD;  Location[facility];  Service: Endoscopy;;  gastric    TUBAL LIGATION      Vitals BP 116/75 (BP Location: Right Arm)   Pulse 67  Temp 97.8 F (36.6 C) (Oral)   Resp 16   Ht '5\' 3"'$  (1.6 m)   Wt 65.9 kg   SpO2 95%   BMI 25.72 kg/m   Physical Exam Constitutional: Sitting up in the bed and appears comfortable, deconditioned    Comments: Missing teeth, oral mucosa dry  Cardiovascular:     Rate and Rhythm: Normal rate and regular rhythm.     Heart sounds:    Pulmonary:     Effort: Pulmonary effort is normal on room air.     Comments: Normal breath sounds  Abdominal:     Palpations: Abdomen is soft.     Tenderness: Nontender and nondistended.  Bowel sounds present  Musculoskeletal:        General: No swelling or tenderness in peripheral extremities  Skin:    Comments: No obvious rashes, chronic skin discoloration of bilateral lower extremities  Neurological:     General: Awake, alert and follows commands.  Grossly nonfocal  Psychiatric:        Mood and Affect: Mood normal.   Pertinent Microbiology Results for orders placed or performed during the hospital encounter of 04/30/22  SARS Coronavirus 2 by RT PCR (hospital order, performed in [facility] hospital lab) *cepheid single result test*     Status: None   Collection Time: 04/30/22  8:48 PM   Specimen: Nasal Swab  Result Value Ref Range Status   SARS Coronavirus 2 by RT PCR NEGATIVE NEGATIVE Final    Comment: (NOTE) SARS-CoV-2 target nucleic acids are NOT DETECTED.  The SARS-CoV-2 RNA is generally detectable in upper and lower respiratory specimens during the acute phase of infection. The lowest concentration of SARS-CoV-2 viral copies this assay can detect is 250 copies / mL. A negative result does not preclude SARS-CoV-2 infection and should not be used as the sole basis for treatment or other patient management decisions.  A negative result may occur with improper specimen collection / handling, submission of specimen other than nasopharyngeal swab, presence of viral mutation(s) within the areas targeted by this assay, and inadequate number of viral copies (<250 copies / mL). A negative result must be combined with clinical observations, patient history, and epidemiological information.  Fact Sheet for Patients:   https://www.patel.info/  Fact Sheet for Healthcare Providers: https://hall.com/  This test is not yet approved or  cleared by the [place] FDA and has been authorized for  detection and/or diagnosis of SARS-CoV-2 by FDA under an Emergency Use Authorization (EUA).  This EUA will remain in effect (meaning this test can be used) for the duration of the COVID-19 declaration under Section 564(b)(1) of the Act, 21 U.S.C. section 360bbb-3(b)(1), unless the authorization is terminated or revoked sooner.  Performed at [facility], [place] [address] [place], [place] [address]   Culture, blood (Routine X 2) w Reflex to ID Panel     Status: Abnormal   Collection Time: 04/30/22  9:15 PM   Specimen: BLOOD  Result Value Ref Range Status   Specimen Description BLOOD BLOOD RIGHT FOREARM  Final   Special Requests   Final    BOTTLES DRAWN AEROBIC AND ANAEROBIC Blood Culture adequate volume   Culture  Setup Time   Final    GRAM NEGATIVE RODS IN BOTH AEROBIC AND ANAEROBIC BOTTLES CRITICAL VALUE NOTED.  VALUE IS CONSISTENT WITH PREVIOUSLY REPORTED AND CALLED VALUE.    Culture (A)  Final    ESCHERICHIA COLI SUSCEPTIBILITIES PERFORMED ON PREVIOUS CULTURE WITHIN THE LAST 5 DAYS.  Performed at [facility], [place] [address], [place], [place] [address]    Report Status 05/03/2022 FINAL  Final  Culture, blood (Routine X 2) w Reflex to ID Panel     Status: Abnormal   Collection Time: 04/30/22  9:30 PM   Specimen: BLOOD  Result Value Ref Range Status   Specimen Description BLOOD BLOOD LEFT FOREARM  Final   Special Requests   Final    BOTTLES DRAWN AEROBIC AND ANAEROBIC Blood Culture adequate volume   Culture  Setup Time   Final    GRAM NEGATIVE RODS IN BOTH AEROBIC AND ANAEROBIC BOTTLES CRITICAL RESULT CALLED TO, READ BACK BY AND VERIFIED WITH: PHARMD K PIERCE [id number] AT 0630 AM BY CM Performed at [facility], [place] [address], [place], [place] [address]    Culture (A)  Final    ESCHERICHIA COLI Confirmed Extended Spectrum Beta-Lactamase Producer (ESBL).  In bloodstream infections from ESBL organisms, carbapenems are preferred over piperacillin/tazobactam.  They are shown to have a lower risk of mortality.    Report Status 05/03/2022 FINAL  Final   Organism ID, Bacteria ESCHERICHIA COLI  Final      Susceptibility   Escherichia coli - MIC*    AMPICILLIN >=32 RESISTANT Resistant     CEFAZOLIN >=64 RESISTANT Resistant     CEFEPIME >=32 RESISTANT Resistant     CEFTAZIDIME >=64 RESISTANT Resistant     CEFTRIAXONE >=64 RESISTANT Resistant     CIPROFLOXACIN >=4 RESISTANT Resistant     GENTAMICIN <=1 SENSITIVE Sensitive     IMIPENEM <=0.25 SENSITIVE Sensitive     TRIMETH/SULFA >=320 RESISTANT Resistant     AMPICILLIN/SULBACTAM >=32 RESISTANT Resistant     PIP/TAZO 16 SENSITIVE Sensitive     * ESCHERICHIA COLI  Blood Culture ID Panel (Reflexed)     Status: Abnormal (Preliminary result)   Collection Time: 04/30/22  9:30 PM  Result Value Ref Range Status   Enterococcus faecalis NOT DETECTED NOT DETECTED Final   Enterococcus Faecium NOT DETECTED NOT DETECTED Final   Listeria monocytogenes NOT DETECTED NOT DETECTED Final   Staphylococcus species NOT DETECTED NOT DETECTED Final   Staphylococcus aureus (BCID) NOT DETECTED NOT DETECTED Final   Staphylococcus epidermidis NOT DETECTED NOT DETECTED Final   Staphylococcus lugdunensis NOT DETECTED NOT DETECTED Final   Streptococcus species NOT DETECTED NOT DETECTED Final   Streptococcus agalactiae NOT DETECTED NOT DETECTED Final   Streptococcus pneumoniae NOT DETECTED NOT DETECTED Final   Streptococcus pyogenes NOT DETECTED NOT DETECTED Final   A.calcoaceticus-baumannii NOT DETECTED NOT DETECTED Final   Bacteroides fragilis NOT DETECTED NOT DETECTED Final   Enterobacterales PENDING NOT DETECTED Incomplete   Enterobacter cloacae complex NOT DETECTED NOT DETECTED Final   Escherichia coli DETECTED (A) NOT DETECTED Final    Comment: CRITICAL RESULT CALLED TO, READ BACK BY AND VERIFIED WITH: PHARMD K PIERCE [id number] AT 1156 AM BY CM    Klebsiella aerogenes NOT DETECTED NOT DETECTED Final   Klebsiella  oxytoca NOT DETECTED NOT DETECTED Final   Klebsiella pneumoniae NOT DETECTED NOT DETECTED Final   Proteus species NOT DETECTED NOT DETECTED Final   Salmonella species NOT DETECTED NOT DETECTED Final   Serratia marcescens NOT DETECTED NOT DETECTED Final   Haemophilus influenzae NOT DETECTED NOT DETECTED Final   Neisseria meningitidis NOT DETECTED NOT DETECTED Final   Pseudomonas aeruginosa NOT DETECTED NOT DETECTED Final   Stenotrophomonas maltophilia NOT DETECTED NOT DETECTED Final   Candida albicans NOT DETECTED NOT DETECTED Final  Candida auris NOT DETECTED NOT DETECTED Final   Candida glabrata NOT DETECTED NOT DETECTED Final   Candida krusei NOT DETECTED NOT DETECTED Final   Candida parapsilosis NOT DETECTED NOT DETECTED Final   Candida tropicalis NOT DETECTED NOT DETECTED Final   Cryptococcus neoformans/gattii NOT DETECTED NOT DETECTED Final   CTX-M ESBL DETECTED (A) NOT DETECTED Final    Comment: CRITICAL RESULT CALLED TO, READ BACK BY AND VERIFIED WITH: PHARMD K PIERCE [id number] AT 1156 AM BY CM (NOTE) Extended spectrum beta-lactamase detected. Recommend a carbapenem as initial therapy.      Carbapenem resistance IMP NOT DETECTED NOT DETECTED Final   Carbapenem resistance KPC NOT DETECTED NOT DETECTED Final   Carbapenem resistance NDM NOT DETECTED NOT DETECTED Final   Carbapenem resist OXA 48 LIKE NOT DETECTED NOT DETECTED Final   Carbapenem resistance VIM NOT DETECTED NOT DETECTED Final    Comment: Performed at [facility], [place] [address], [place], [place] [address]  MRSA Next Gen by PCR, Nasal     Status: None   Collection Time: 04/30/22 10:10 PM   Specimen: Nasal Mucosa; Nasal Swab  Result Value Ref Range Status   MRSA by PCR Next Gen NOT DETECTED NOT DETECTED Final    Comment: (NOTE) The GeneXpert MRSA Assay (FDA approved for NASAL specimens only), is one component of a comprehensive MRSA colonization surveillance program. It is not intended to diagnose MRSA  infection nor to guide or monitor treatment for MRSA infections. Test performance is not FDA approved in patients less than 31 years old. Performed at [facility], [place] [address] [place], [place] [address]     Pertinent Lab.    Latest Ref Rng & Units 05/03/2022    3:22 AM 05/02/2022    3:11 AM 05/01/2022    2:11 AM  CBC  WBC 4.0 - 10.5 K/uL 12.2  21.1  25.2   Hemoglobin 12.0 - 15.0 g/dL 11.3  10.5  12.1   Hematocrit 36.0 - 46.0 % 33.9  33.1  37.2   Platelets 150 - 400 K/uL 208  198  205       Latest Ref Rng & Units 05/02/2022    3:11 AM 05/01/2022    2:11 AM 04/30/2022   10:44 PM  CMP  Glucose 70 - 99 mg/dL 98  99    BUN 8 - 23 mg/dL 14  9    Creatinine 0.44 - 1.00 mg/dL 0.68  0.67    Sodium 135 - 145 mmol/L 140  141  139   Potassium 3.5 - 5.1 mmol/L 4.2  3.1  2.8   Chloride 98 - 111 mmol/L 115  117    CO2 22 - 32 mmol/L 20  19    Calcium 8.9 - 10.3 mg/dL 7.9  6.4    Total Protein 6.5 - 8.1 g/dL  3.9    Total Bilirubin 0.3 - 1.2 mg/dL  0.7    Alkaline Phos 38 - 126 U/L  37    AST 15 - 41 U/L  20    ALT 0 - 44 U/L  10       Pertinent Imaging today Plain films and CT images have been personally visualized and interpreted; radiology reports have been reviewed. Decision making incorporated into the Impression / Recommendations.  CT Head Wo Contrast  Result Date: 04/30/2022 CLINICAL DATA:  Altered mental status. EXAM: CT HEAD WITHOUT CONTRAST TECHNIQUE: Contiguous axial images were obtained from the base of the skull through the  vertex without intravenous contrast. RADIATION DOSE REDUCTION: This exam was performed according to the departmental dose-optimization program which includes automated exposure control, adjustment of the mA and/or kV according to patient size and/or use of iterative reconstruction technique. COMPARISON:  November 13, 2012 FINDINGS: Brain: There is mild cerebral atrophy with widening of the extra-axial spaces and ventricular dilatation. There are areas  of decreased attenuation within the white matter tracts of the supratentorial brain, consistent with microvascular disease changes. Small, chronic bilateral basal ganglia lacunar infarcts are noted. Vascular: No hyperdense vessel or unexpected calcification. Skull: Normal. Negative for fracture or focal lesion. Sinuses/Orbits: No acute finding. Other: None. IMPRESSION: 1. No acute intracranial abnormality. 2. Generalized cerebral atrophy and microvascular disease changes of the supratentorial brain. 3. Small, chronic bilateral basal ganglia lacunar infarcts. Electronically Signed   By: Virgina Norfolk M.D.   On: 04/30/2022 22:10   DG Chest Port 1 View  Result Date: 04/30/2022 CLINICAL DATA:  Shortness of breath and altered mental status. EXAM: PORTABLE CHEST 1 VIEW COMPARISON:  March 29, 2022 FINDINGS: The cardiac silhouette is mildly enlarged and unchanged in size. Mild, diffuse, chronic appearing increased interstitial lung markings are noted. Mild to moderate severity areas of atelectasis and/or infiltrate are seen within the bilateral lung bases. There is a small left pleural effusion. No pneumothorax is identified. Multilevel degenerative changes seen throughout the thoracic spine. IMPRESSION: 1. Stable cardiomegaly with mild to moderate severity bibasilar atelectasis and/or infiltrate. 2. Small left pleural effusion. Electronically Signed   By: Virgina Norfolk M.D.   On: 04/30/2022 21:30     I spent 55 minutes for this patient encounter including review of prior medical records, coordination of care with primary/other specialist with greater than 50% of time being face to face/counseling and discussing diagnostics/treatment plan with the patient/family.  Electronically signed by:   Rosiland Oz, MD Infectious Disease Physician [facility] Pager: [phone]

## 2022-05-03 NOTE — [place] FL2 (Signed)
[place] MEDICAID FL2 LEVEL OF CARE SCREENING TOOL     IDENTIFICATION  Patient Name: Stacey Davenport Birthdate: 07/12/1941 Sex: female Admission Date (Current Location): 04/30/2022  [facility] and [place] Number:  Herbalist and Address:  [facility] [place], [place] [address] [place], [place] [address]      Provider Number: [id number]  Attending Physician Name and Address:  Shelly Coss, MD  Relative Name and Phone Number:  Greer, [place] [phone]  [phone]    Current Level of Care: Hospital Recommended Level of Care: [place] Prior Approval Number:    Date Approved/Denied:   PASRR Number: [id number]  Discharge Plan: SNF    Current Diagnoses: Patient Active Problem List   Diagnosis Date Noted   Bacteremia    Severe sepsis ([place]) 04/30/2022   Pneumonia due to COVID-19 virus 03/24/2022   Chronic diastolic CHF (congestive heart failure) ([place]) 03/24/2022   Leg swelling 11/30/2021   Mild protein-calorie malnutrition ([place]) 10/22/2021   Depression 10/22/2021   Elevated brain natriuretic peptide (BNP) level 10/22/2021   GI bleed 10/22/2021   Anticoagulated    Sagittal band rupture, extensor tendon, nontraumatic, left 12/30/2020   Irregular heart beat 08/15/2017   Primary open angle glaucoma of both eyes, mild stage 04/11/2017   High risk medication use 09/03/2013   Multiple drug allergies 09/03/2013   Osteoporosis, postmenopausal 05/23/2013   Hypothyroidism 04/23/2013   Hyperlipidemia 12/06/2012   History of CVA (cerebrovascular accident) 12/06/2012   Atrial fibrillation ([place]) 09/30/2011   Hypertension 08/20/2009   Glaucoma 04/17/2009   Asthma, chronic, steroid-dependent 04/17/2009   ESOPHAGEAL STRICTURE 04/17/2009   GERD 04/17/2009   Rheumatoid arthritis ([place]) 04/17/2009    Orientation RESPIRATION BLADDER Height & Weight     Self  Normal Incontinent Weight: 145 lb 3.2 oz (65.9 kg) Height:  '5\' 3"'$  (160 cm)   BEHAVIORAL SYMPTOMS/MOOD NEUROLOGICAL BOWEL NUTRITION STATUS      Continent Diet (see discharge summary)  AMBULATORY STATUS COMMUNICATION OF NEEDS Skin   Total Care Verbally Normal                       Personal Care Assistance Level of Assistance  Bathing, Feeding, Dressing Bathing Assistance: Maximum assistance Feeding assistance: Limited assistance Dressing Assistance: Maximum assistance     Functional Limitations Info  Sight, Hearing, Speech Sight Info: Adequate Hearing Info: Adequate Speech Info: Adequate    SPECIAL CARE FACTORS FREQUENCY  PT (By licensed PT), OT (By licensed OT)     PT Frequency: 5x week OT Frequency: 5x week            Contractures Contractures Info: Not present    Additional Factors Info  Code Status, Allergies Code Status Info: DNR Allergies Info: Aciphex (Rabeprazole Sodium), Amoxil (Amoxicillin), Avelox (Moxifloxacin), Biaxin (Clarithromycin), Hydrocodone, Ketek (Telithromycin), Latex, Levaquin (Levofloxacin), Lipitor (Atorvastatin), Lyrica (Pregabalin), Macrobid (Nitrofurantoin Macrocrystal), Nexium (Esomeprazole Magnesium), Sulfa Antibiotics, Sulfonamide Derivatives, Vesicare (Solifenacin), Zithromax (Azithromycin), Keflex (Cephalexin)           Current Medications (05/03/2022):  This is the current hospital active medication list Current Facility-Administered Medications  Medication Dose Route Frequency Provider Last Rate Last Admin   0.9 %  sodium chloride infusion   Intravenous Continuous Marcelyn Bruins, MD 10 mL/hr at 05/02/22 1604 Infusion Verify at 05/02/22 1604   acetaminophen (TYLENOL) tablet 650 mg  650 mg Oral Q6H PRN Marcelyn Bruins, MD       Or   acetaminophen (TYLENOL) suppository  650 mg  650 mg Rectal Q6H PRN Marcelyn Bruins, MD       amiodarone (PACERONE) tablet 200 mg  200 mg Oral Daily Marcelyn Bruins, MD   200 mg at 05/03/22 0825   diclofenac Sodium (VOLTAREN) 1 % topical gel 2 g  2 g Topical Q6H  PRN Shelly Coss, MD       feeding supplement (ENSURE ENLIVE / ENSURE PLUS) liquid 237 mL  237 mL Oral BID BM Marcelyn Bruins, MD   237 mL at 05/03/22 1539   latanoprost (XALATAN) 0.005 % ophthalmic solution 1 drop  1 drop Both Eyes QHS Marcelyn Bruins, MD   1 drop at 05/02/22 2302   levalbuterol (XOPENEX) nebulizer solution 0.63 mg  0.63 mg Nebulization Q6H PRN Marcelyn Bruins, MD       meropenem (MERREM) 1 g in sodium chloride 0.9 % 100 mL IVPB  1 g Intravenous Q12H Adhikari, Amrit, MD 200 mL/hr at 05/03/22 1024 1 g at 05/03/22 1024   midodrine (PROAMATINE) tablet 5 mg  5 mg Oral TID WC Shelly Coss, MD   5 mg at 05/03/22 1314   mometasone-formoterol (DULERA) 200-5 MCG/ACT inhaler 2 puff  2 puff Inhalation BID Marcelyn Bruins, MD   2 puff at 05/03/22 0809   pantoprazole (PROTONIX) EC tablet 40 mg  40 mg Oral Daily Marcelyn Bruins, MD   40 mg at 05/03/22 0825   polyethylene glycol (MIRALAX / GLYCOLAX) packet 17 g  17 g Oral Daily PRN Marcelyn Bruins, MD       polyvinyl alcohol (LIQUIFILM TEARS) 1.4 % ophthalmic solution 1 drop  1 drop Both Eyes QID PRN Marcelyn Bruins, MD       predniSONE (DELTASONE) tablet 5 mg  5 mg Oral Q breakfast Marcelyn Bruins, MD   5 mg at 05/03/22 0825   rivaroxaban (XARELTO) tablet 20 mg  20 mg Oral Q supper Marcelyn Bruins, MD   20 mg at 05/02/22 1728   saccharomyces boulardii (FLORASTOR) capsule 250 mg  250 mg Oral Daily Marcelyn Bruins, MD   250 mg at 05/03/22 0825   sodium chloride flush (NS) 0.9 % injection 3 mL  3 mL Intravenous Q12H Marcelyn Bruins, MD   3 mL at 05/03/22 1029     Discharge Medications: Please see discharge summary for a list of discharge medications.  Relevant Imaging Results:  Relevant Lab Results:   Additional Information SSN: [id number]  Joanne Chars, LCSW

## 2022-05-03 NOTE — Progress Notes (Signed)
PHARMACY CONSULT NOTE FOR:  OUTPATIENT  PARENTERAL ANTIBIOTIC THERAPY (OPAT)  NOTE: This OPAT is informational only as the patient will be discharging to SNF.  Indication: ESBL E. Coli BSI with no oral therapy options and unclear source of infection  Regimen: Ertapenem 1g IV Q24H  End date: 05/15/22  IV antibiotic discharge orders are pended. To discharging provider:  please sign these orders via discharge navigator,  Select New Orders & click on the button choice - Manage This Unsigned Work.     Thank you for allowing pharmacy to be a part of this patient's care.  Adria Dill, PharmD PGY-2 Infectious Diseases Resident  05/03/2022 1:29 PM

## 2022-05-03 NOTE — Plan of Care (Signed)
  Problem: Fluid Volume: Goal: Hemodynamic stability will improve Outcome: Progressing   Problem: Activity: Goal: Risk for activity intolerance will decrease Outcome: Progressing   Problem: Nutrition: Goal: Adequate nutrition will be maintained Outcome: Progressing   Problem: Skin Integrity: Goal: Risk for impaired skin integrity will decrease Outcome: Progressing

## 2022-05-04 LAB — BASIC METABOLIC PANEL
Anion gap: 6 (ref 5–15)
BUN: 6 mg/dL — ABNORMAL LOW (ref 8–23)
CO2: 26 mmol/L (ref 22–32)
Calcium: 8.1 mg/dL — ABNORMAL LOW (ref 8.9–10.3)
Chloride: 104 mmol/L (ref 98–111)
Creatinine, Ser: 0.63 mg/dL (ref 0.44–1.00)
GFR, Estimated: >60 mL/min (ref >60)
Glucose, Bld: 81 mg/dL (ref 70–99)
Potassium: 3.6 mmol/L (ref 3.5–5.1)
Sodium: 136 mmol/L (ref 135–145)

## 2022-05-04 MED ORDER — CHLORHEXIDINE GLUCONATE CLOTH 2 % EX PADS
6.0000 | MEDICATED_PAD | Freq: Every day | CUTANEOUS | Status: DC
  Administered 2022-05-04: 6 via TOPICAL

## 2022-05-04 MED ORDER — TRAMADOL HCL 50 MG PO TABS: 25.0000 mg | ORAL_TABLET | Freq: Two times a day (BID) | ORAL | 0 refills | Status: DC | PRN

## 2022-05-04 MED ORDER — SODIUM CHLORIDE 0.9% FLUSH: 10.0000 mL | INTRAVENOUS | Status: DC | PRN

## 2022-05-04 MED ORDER — LORAZEPAM 0.5 MG PO TABS: 0.5000 mg | ORAL_TABLET | Freq: Every day | ORAL | 0 refills | Status: DC

## 2022-05-04 MED ORDER — SODIUM CHLORIDE 0.9 % IV SOLN
1.0000 g | INTRAVENOUS | Status: DC
  Administered 2022-05-04: 1000 mg via INTRAVENOUS
  Filled 2022-05-04: qty 1

## 2022-05-04 MED ORDER — SODIUM CHLORIDE 0.9% FLUSH
10.0000 mL | Freq: Two times a day (BID) | INTRAVENOUS | Status: DC
  Administered 2022-05-04: 10 mL

## 2022-05-04 MED ORDER — PREDNISONE 5 MG PO TABS: 5.0000 mg | ORAL_TABLET | Freq: Every day | ORAL | Status: DC

## 2022-05-04 MED ORDER — ERTAPENEM IV (FOR PTA / DISCHARGE USE ONLY): 1.0000 g | INTRAVENOUS | 0 refills | Status: DC

## 2022-05-04 NOTE — Progress Notes (Signed)
IVT consult for ML: Secure chat w/ unit Rn; stated he is not sure what time pt is discharging today and pt is asleep at this time. Will check on pt later.

## 2022-05-04 NOTE — Progress Notes (Signed)

## 2022-05-04 NOTE — TOC Transition Note (Signed)
Transition of Care [facility]) - CM/SW Discharge Note   Patient Details  Name: Stacey Davenport MRN: [id number] Date of Birth: [birth year]  [facility] of [facility]) CM/SW Contact:  Joanne Chars, LCSW Phone Number: 05/04/2022, 1:17 PM   Clinical Narrative:  Pt discharging to [facility].  RN call report to  [phone] for report.   [facility] transportation scheduled to be here at 1400.    Final next level of care: Skilled Nursing Facility Barriers to Discharge: Barriers Resolved   Patient Goals and CMS Choice     Choice offered to / list presented to : Adult Children (son Shanon Brow)  Discharge Placement              Patient chooses bed at:  [facility]) Patient to be transferred to facility by: [facility] Name of family member notified: son Shanon Brow Patient and family notified of of transfer: 05/04/22  Discharge Plan and Services In-house Referral: Clinical Social Work   Post Acute Care Choice: [place]                               Social Determinants of Health (SDOH) Interventions     Readmission Risk Interventions     No data to display

## 2022-05-04 NOTE — Discharge Summary (Signed)
Physician Discharge Summary  Stacey Davenport IDP:[id number] DOB: [birth year] DOA: 04/30/2022  PCP: Janifer Adie, MD  Admit date: 04/30/2022 Discharge date: 05/04/2022  Admitted From: Home Disposition:  Home  Discharge Condition:Stable CODE STATUS: DNR Diet recommendation: Heart Healthy  Brief/Interim Summary:  Patient is a 81 year old female with history of asthma, hypertension, GERD, atrial fibrillation, arthritis, glaucoma, hyperlipidemia, CVA, hypothyroidism, depression, who presented from SNF with complaints of shortness of breath, altered mentation.  She was found to be hypoxic at her facility and was requiring supplemental oxygen, noted to be less aware than her baseline.  On presentation, she was hypotensive with systolic blood pressure in the range of 80s to 90s, required 3 L of oxygen.  Lab work showed potassium of 2.9, WBC count of 20.1, lactic acid of 1.5, lipase level normal.  Procalcitonin elevated.  Chest x-ray stable cardiomegaly with mild to moderate atelectasis versus infiltrate.  CT head did not show any acute intracranial abnormalities.  Patient was started on broad-spectrum antibiotics for suspicion of sepsis from pneumonia.  Blood cultures showed ESBL E. coli, ID consulted.  Sepsis physiology has significantly improved.  ID recommended to continue ertapenem through 05/15/2022, status post placement of midline.  Plan for discharge today.  Following problems were addressed during her hospitalization:  Severe sepsis/E. coli bacteremia secondary to pneumonia: Presented with altered mentation, hypoxia, hypotension, leukocytosis.  Elevated procalcitonin.  Blood cultures showed ESBL E. coli .currently on Merrem .ID were following.  Sepsis  physiology has resolved.  Afebrile, blood pressure better today.  Continue ertapenem on discharge.  She will follow-up with ID as an outpatient.   Pneumonia: Chest x-ray showed Stable cardiomegaly with mild to moderate severity bibasilar  atelectasis and/or infiltrate.  Continue current antibiotics for now.  Respiratory status stable, On room air   Hypomagnesemia: Supplemented  and corrected   Paroxysmal A-fib: Monitor on telemetry continue.  Rate controlled amiodarone, Xarelto.     Chronic hypoxic respiratory failure: Recently admitted and was discharged from [facility]22 after she was treated for COVID-pneumonia with steroids, remdesivir.  Hospital course was also complicated with aspiration pneumonia.  She was discharged on 2 L of oxygen with activity.   Asthma: Stable   Dementia: Confused at baseline.  Found to be more confused at the skilled nursing facility most likely secondary to pneumonia/bacteremia.  Continue delirium precautions, frequent reorientation.  Mentation has improved now.   Hypertension: BP stable   GERD: Continue PPI   Rheumatoid arthritis: Continue home steroid   Glaucoma: Continue home eyedrops   History of hyperlipidemia/CVA: Currently not on medications   Hypothyroidism: Continue Synthyroid   dCHF: Has history of grade 2 diastolic dysfunction with normal ejection fraction as per the last echo.  On Lasix   Discharge Diagnoses:  Principal Problem:   Severe sepsis ([place]) Active Problems:   Hypertension   Asthma, chronic, steroid-dependent   GERD   Rheumatoid arthritis ([place])   Atrial fibrillation (HCC)   Hyperlipidemia   History of CVA (cerebrovascular accident)   Hypothyroidism   Depression   Chronic diastolic CHF (congestive heart failure) ([place])   Bacteremia    Discharge Instructions  Discharge Instructions     [facility] pharmacist to adjust dose for Vancomycin, Aminoglycosides and other anti-infective therapies as requested by physician.   Complete by: As directed    [facility] to provide Cath Flo 59m   Complete by: As directed    Administer for PICC line occlusion and as ordered by physician for  other access device issues.   Anaphylaxis  Kit: Provided to treat any anaphylactic reaction to the medication being provided to the patient if First Dose or when requested by physician   Complete by: As directed    Epinephrine 47m/ml vial / amp: Administer 0.318m(0.41m71msubcutaneously once for moderate to severe anaphylaxis, nurse to call physician and pharmacy when reaction occurs and call 911 if needed for immediate care   Diphenhydramine 57m75m IV vial: Administer 25-57mg88mIM PRN for first dose reaction, rash, itching, mild reaction, nurse to call physician and pharmacy when reaction occurs   Sodium Chloride 0.9% NS 500ml 36mAdminister if needed for hypovolemic blood pressure drop or as ordered by physician after call to physician with anaphylactic reaction   Change dressing on IV access line weekly and PRN   Complete by: As directed    Diet - low sodium heart healthy   Complete by: As directed    Discharge instructions   Complete by: As directed    1)Please take prescribed medications as instructed 2)Do a CBC and BMP tests ina week 3)Follow up with infectious disease as an  outpatient   Flush IV access with Sodium Chloride 0.9% and Heparin 10 units/ml or 100 units/ml   Complete by: As directed    Home infusion instructions - [facility]   Complete by: As directed    Instructions: Flush IV access with Sodium Chloride 0.9% and Heparin 10units/ml or 100units/ml   Change dressing on IV access line: Weekly and PRN   Instructions Cath Flo 2mg: A71mnister for PICC Line occlusion and as ordered by physician for other access device   [facility] pharmacist to adjust dose for: Vancomycin, Aminoglycosides and other anti-infective therapies as requested by physician   Increase activity slowly   Complete by: As directed    Method of administration may be changed at the discretion of home infusion pharmacist based upon assessment of the patient and/or caregiver's ability to self-administer the medication ordered    Complete by: As directed    No wound care   Complete by: As directed       Allergies as of 05/04/2022       Reactions   Aciphex [rabeprazole Sodium] Other (See Comments)   Unknown reaction Listed on MAR   Amoxil [amoxicillin] Other (See Comments)   Unknown reaction Listed on MAR   Avelox [moxifloxacin] Other (See Comments)   Unknown reaction Listed on MAR   Biaxin [clarithromycin] Hives, Itching   Hydrocodone Other (See Comments)   Unknown reaction  Not listed on MAR   [facility] [telithromycin] Other (See Comments)   Unknown reaction Not listed on MAR   Latex Other (See Comments)   Unknown reaction Listed on MAR   Levaquin [levofloxacin] Other (See Comments)   Unknown reaction  Listed on MAR   Lipitor [atorvastatin] Other (See Comments)   Unknown reaction Listed on MAR   Lyrica [pregabalin] Other (See Comments)   Unknown reaction Listed on MAR   Macrobid [nitrofurantoin Macrocrystal] Other (See Comments)   Unknown reaction  Listed on MAR   Nexium [esomeprazole Magnesium] Other (See Comments)   Unknown reaction Listed on MAR   Sulfa Antibiotics Other (See Comments)   Unknown reaction  Listed on MAR   Sulfonamide Derivatives Other (See Comments)   Unknown reaction Listed on MAR   Vesicare [solifenacin] Other (See Comments)   Unknown reaction Listed on MAR   Zithromax [azithromycin] Other (See Comments)   Unknown reaction Listed on  MAR   Keflex [cephalexin] Itching, Rash        Medication List     TAKE these medications    acetaminophen 650 MG CR tablet Commonly known as: TYLENOL Take 650 mg by mouth See admin instructions. 2 entries on MAR: 650 mg 3 times daily 650 mg every 4 hours as needed for pain   amiodarone 200 MG tablet Commonly known as: PACERONE Take 1 tablet (200 mg total) by mouth daily.   BIOFREEZE ROLL-ON EX Apply 1 application  topically every 4 (four) hours as needed (joint pain).   bisacodyl 10 MG suppository Commonly known as:  DULCOLAX Place 1 suppository (10 mg total) rectally daily as needed for moderate constipation. What changed: reasons to take this   brimonidine 0.1 % Soln Commonly known as: ALPHAGAN P Place 1 drop into the left eye in the morning, at noon, and at bedtime.   budesonide-formoterol 160-4.5 MCG/ACT inhaler Commonly known as: SYMBICORT Inhale 2 puffs into the lungs 2 (two) times daily.   Cranberry 425 MG Caps Take 425 mg by mouth daily as needed (bladder irritation).   cyanocobalamin 1000 MCG tablet Take 1,000 mcg by mouth daily.   Ensure Take 237 mLs by mouth 2 (two) times daily.   ertapenem  IVPB Commonly known as: INVANZ Inject 1 g into the vein daily. Indication:  ESBL E. Coli BSI  First Dose: No Last Day of Therapy:  05/15/22 Labs - Once weekly:  CBC/D and BMP, Labs - Every other week:  ESR and CRP Method of administration: Mini-Bag Plus / Gravity Method of administration may be changed at the discretion of home infusion pharmacist based upon assessment of the patient and/or caregiver's ability to self-administer the medication ordered.   ferrous sulfate 325 (65 FE) MG tablet Take 325 mg by mouth daily.   FLEET ENEMA RE Place 1 enema rectally daily as needed (constipation not relieved by Bisacodyl suppository).   furosemide 40 MG tablet Commonly known as: LASIX Take 1 tablet (40 mg total) by mouth daily.   GenTeal Tears Severe Day/Night 0.4-0.3 % Gel ophthalmic gel Generic drug: Polyethyl Glycol-Propyl Glycol Place 1 Application into both eyes at bedtime.   guaiFENesin 600 MG 12 hr tablet Commonly known as: MUCINEX Take 2 tablets (1,200 mg total) by mouth 2 (two) times daily.   hydrocortisone cream 1 % Apply 1 Application topically 2 (two) times daily. To bilateral hands   latanoprost 0.005 % ophthalmic solution Commonly known as: XALATAN PLACE 1 DROP IN [facility] AT BEDTIME What changed: See the new instructions.   levalbuterol 0.63 MG/3ML nebulizer  solution Commonly known as: XOPENEX Take 3 mLs (0.63 mg total) by nebulization every 6 (six) hours as needed for wheezing or shortness of breath.   levalbuterol 45 MCG/ACT inhaler Commonly known as: Xopenex HFA Inhale 2 puffs into the lungs 3 (three) times daily.   levothyroxine 75 MCG tablet Commonly known as: SYNTHROID Take 75 mcg by mouth daily.   LORazepam 0.5 MG tablet Commonly known as: ATIVAN Take 1 tablet (0.5 mg total) by mouth at bedtime. May also give 1 tablet by mouth twice a day as needed for anxiety   magnesium hydroxide 400 MG/5ML suspension Commonly known as: MILK OF MAGNESIA Take 30 mLs by mouth daily as needed (constipation).   menthol-cetylpyridinium 3 MG lozenge Commonly known as: CEPACOL Take 1 lozenge (3 mg total) by mouth as needed for sore throat.   OCUSOFT LID SCRUB EX Place 1 Pad into both eyes daily.  Olopatadine HCl 0.2 % Soln Place 1 drop into both eyes daily.   ondansetron 4 MG tablet Commonly known as: Zofran Take 1 tablet (4 mg total) by mouth daily as needed for nausea or vomiting.   pantoprazole 40 MG tablet Commonly known as: Protonix Take 1 tablet (40 mg total) by mouth 2 (two) times daily.   polyethylene glycol 17 g packet Commonly known as: MIRALAX / GLYCOLAX Take 17 g by mouth daily.   predniSONE 5 MG tablet Commonly known as: DELTASONE Take 1 tablet (5 mg total) by mouth daily.   Probiotic 250 MG Caps Take 250 mg by mouth daily.   rivaroxaban 20 MG Tabs tablet Commonly known as: XARELTO Take 20 mg by mouth at bedtime.   senna-docusate 8.6-50 MG tablet Commonly known as: Senokot-S Take 2 tablets by mouth at bedtime.   sodium fluoride 1.1 % Crea dental cream Commonly known as: PREVIDENT 5000 PLUS Place 1 Application onto teeth at bedtime.   traMADol 50 MG tablet Commonly known as: ULTRAM Take 0.5 tablets (25 mg total) by mouth 2 (two) times daily as needed for moderate pain (back pain).   triamcinolone cream 0.1  % Commonly known as: KENALOG Apply 1 Application topically 2 (two) times daily. To rash on lower back.   Vitamin D 50 MCG (2000 UT) Caps Take 2,000 Units by mouth daily.               Discharge Care Instructions  (From admission, onward)           Start     Ordered   05/04/22 0000  Change dressing on IV access line weekly and PRN  (Home infusion instructions - [facility] )        05/04/22 0911            Allergies  Allergen Reactions   Aciphex [Rabeprazole Sodium] Other (See Comments)    Unknown reaction Listed on MAR   Amoxil [Amoxicillin] Other (See Comments)    Unknown reaction Listed on MAR   Avelox [Moxifloxacin] Other (See Comments)    Unknown reaction Listed on MAR   Biaxin [Clarithromycin] Hives and Itching   Hydrocodone Other (See Comments)    Unknown reaction  Not listed on MAR   Ketek [Telithromycin] Other (See Comments)    Unknown reaction Not listed on MAR   Latex Other (See Comments)    Unknown reaction Listed on MAR   Levaquin [Levofloxacin] Other (See Comments)    Unknown reaction  Listed on MAR   Lipitor [Atorvastatin] Other (See Comments)    Unknown reaction Listed on MAR   Lyrica [Pregabalin] Other (See Comments)    Unknown reaction Listed on MAR   Macrobid [Nitrofurantoin Macrocrystal] Other (See Comments)    Unknown reaction  Listed on MAR   Nexium [Esomeprazole Magnesium] Other (See Comments)    Unknown reaction Listed on MAR   Sulfa Antibiotics Other (See Comments)    Unknown reaction  Listed on MAR   Sulfonamide Derivatives Other (See Comments)    Unknown reaction Listed on MAR   Vesicare [Solifenacin] Other (See Comments)    Unknown reaction Listed on MAR   Zithromax [Azithromycin] Other (See Comments)    Unknown reaction Listed on MAR   Keflex [Cephalexin] Itching and Rash    Consultations: ID   Procedures/Studies: CT Head Wo Contrast  Result Date: 04/30/2022 CLINICAL DATA:  Altered mental  status. EXAM: CT HEAD WITHOUT CONTRAST TECHNIQUE: Contiguous axial images were obtained from the  base of the skull through the vertex without intravenous contrast. RADIATION DOSE REDUCTION: This exam was performed according to the departmental dose-optimization program which includes automated exposure control, adjustment of the mA and/or kV according to patient size and/or use of iterative reconstruction technique. COMPARISON:  November 13, 2012 FINDINGS: Brain: There is mild cerebral atrophy with widening of the extra-axial spaces and ventricular dilatation. There are areas of decreased attenuation within the white matter tracts of the supratentorial brain, consistent with microvascular disease changes. Small, chronic bilateral basal ganglia lacunar infarcts are noted. Vascular: No hyperdense vessel or unexpected calcification. Skull: Normal. Negative for fracture or focal lesion. Sinuses/Orbits: No acute finding. Other: None. IMPRESSION: 1. No acute intracranial abnormality. 2. Generalized cerebral atrophy and microvascular disease changes of the supratentorial brain. 3. Small, chronic bilateral basal ganglia lacunar infarcts. Electronically Signed   By: Virgina Norfolk M.D.   On: 04/30/2022 22:10   DG Chest Port 1 View  Result Date: 04/30/2022 CLINICAL DATA:  Shortness of breath and altered mental status. EXAM: PORTABLE CHEST 1 VIEW COMPARISON:  March 29, 2022 FINDINGS: The cardiac silhouette is mildly enlarged and unchanged in size. Mild, diffuse, chronic appearing increased interstitial lung markings are noted. Mild to moderate severity areas of atelectasis and/or infiltrate are seen within the bilateral lung bases. There is a small left pleural effusion. No pneumothorax is identified. Multilevel degenerative changes seen throughout the thoracic spine. IMPRESSION: 1. Stable cardiomegaly with mild to moderate severity bibasilar atelectasis and/or infiltrate. 2. Small left pleural effusion. Electronically  Signed   By: Virgina Norfolk M.D.   On: 04/30/2022 21:30      Subjective: Patient seen and examined the bedside today.  Hemodynamically stable.  No new complaints.  Alert and awake, on room air.  Eating her breakfast.  I had called her son and discussed about discharge planning on 10/9  Discharge Exam: Vitals:   05/04/22 0731 05/04/22 0843  BP: (!) 145/70   Pulse: 62   Resp:    Temp: (!) 97.5 F (36.4 C)   SpO2: 97% 98%   Vitals:   05/03/22 2200 05/04/22 0432 05/04/22 0731 05/04/22 0843  BP:  137/75 (!) 145/70   Pulse: 63 64 62   Resp: 18 17    Temp:   (!) 97.5 F (36.4 C)   TempSrc:   Oral   SpO2: 99% 98% 97% 98%  Weight:      Height:        General: Pt is alert, awake, not in acute distress Cardiovascular: RRR, S1/S2 +, no rubs, no gallops Respiratory: CTA bilaterally, no wheezing, no rhonchi Abdominal: Soft, NT, ND, bowel sounds + Extremities: no edema, no cyanosis    The results of significant diagnostics from this hospitalization (including imaging, microbiology, ancillary and laboratory) are listed below for reference.     Microbiology: Recent Results (from the past 240 hour(s))  SARS Coronavirus 2 by RT PCR (hospital order, performed in [facility] hospital lab) *cepheid single result test*     Status: None   Collection Time: 04/30/22  8:48 PM   Specimen: Nasal Swab  Result Value Ref Range Status   SARS Coronavirus 2 by RT PCR NEGATIVE NEGATIVE Final    Comment: (NOTE) SARS-CoV-2 target nucleic acids are NOT DETECTED.  The SARS-CoV-2 RNA is generally detectable in upper and lower respiratory specimens during the acute phase of infection. The lowest concentration of SARS-CoV-2 viral copies this assay can detect is 250 copies / mL. A negative result does not  preclude SARS-CoV-2 infection and should not be used as the sole basis for treatment or other patient management decisions.  A negative result may occur with improper specimen collection / handling,  submission of specimen other than nasopharyngeal swab, presence of viral mutation(s) within the areas targeted by this assay, and inadequate number of viral copies (<250 copies / mL). A negative result must be combined with clinical observations, patient history, and epidemiological information.  Fact Sheet for Patients:   https://www.patel.info/  Fact Sheet for Healthcare Providers: https://hall.com/  This test is not yet approved or  cleared by the [place] FDA and has been authorized for detection and/or diagnosis of SARS-CoV-2 by FDA under an Emergency Use Authorization (EUA).  This EUA will remain in effect (meaning this test can be used) for the duration of the COVID-19 declaration under Section 564(b)(1) of the Act, 21 U.S.C. section 360bbb-3(b)(1), unless the authorization is terminated or revoked sooner.  Performed at [facility], [place] [address], [place], [place] [address]   Culture, blood (Routine X 2) w Reflex to ID Panel     Status: Abnormal   Collection Time: 04/30/22  9:15 PM   Specimen: BLOOD  Result Value Ref Range Status   Specimen Description BLOOD BLOOD RIGHT FOREARM  Final   Special Requests   Final    BOTTLES DRAWN AEROBIC AND ANAEROBIC Blood Culture adequate volume   Culture  Setup Time   Final    GRAM NEGATIVE RODS IN BOTH AEROBIC AND ANAEROBIC BOTTLES CRITICAL VALUE NOTED.  VALUE IS CONSISTENT WITH PREVIOUSLY REPORTED AND CALLED VALUE.    Culture (A)  Final    ESCHERICHIA COLI SUSCEPTIBILITIES PERFORMED ON PREVIOUS CULTURE WITHIN THE LAST 5 DAYS. Performed at [facility], [place] [address] [place], [place] [address]    Report Status 05/03/2022 FINAL  Final  Culture, blood (Routine X 2) w Reflex to ID Panel     Status: Abnormal   Collection Time: 04/30/22  9:30 PM   Specimen: BLOOD  Result Value Ref Range Status   Specimen Description BLOOD BLOOD LEFT FOREARM  Final   Special Requests    Final    BOTTLES DRAWN AEROBIC AND ANAEROBIC Blood Culture adequate volume   Culture  Setup Time   Final    GRAM NEGATIVE RODS IN BOTH AEROBIC AND ANAEROBIC BOTTLES CRITICAL RESULT CALLED TO, READ BACK BY AND VERIFIED WITH: PHARMD K PIERCE [id number] AT 2563 AM BY CM Performed at [facility], [place] [address], [place], [place] [address]    Culture (A)  Final    ESCHERICHIA COLI Confirmed Extended Spectrum Beta-Lactamase Producer (ESBL).  In bloodstream infections from ESBL organisms, carbapenems are preferred over piperacillin/tazobactam. They are shown to have a lower risk of mortality.    Report Status 05/03/2022 FINAL  Final   Organism ID, Bacteria ESCHERICHIA COLI  Final      Susceptibility   Escherichia coli - MIC*    AMPICILLIN >=32 RESISTANT Resistant     CEFAZOLIN >=64 RESISTANT Resistant     CEFEPIME >=32 RESISTANT Resistant     CEFTAZIDIME >=64 RESISTANT Resistant     CEFTRIAXONE >=64 RESISTANT Resistant     CIPROFLOXACIN >=4 RESISTANT Resistant     GENTAMICIN <=1 SENSITIVE Sensitive     IMIPENEM <=0.25 SENSITIVE Sensitive     TRIMETH/SULFA >=320 RESISTANT Resistant     AMPICILLIN/SULBACTAM >=32 RESISTANT Resistant     PIP/TAZO 16 SENSITIVE Sensitive     * ESCHERICHIA COLI  Blood Culture ID Panel (Reflexed)     Status: Abnormal (Preliminary result)   Collection Time: 04/30/22  9:30 PM  Result Value Ref Range Status   Enterococcus faecalis NOT DETECTED NOT DETECTED Final   Enterococcus Faecium NOT DETECTED NOT DETECTED Final   Listeria monocytogenes NOT DETECTED NOT DETECTED Final   Staphylococcus species NOT DETECTED NOT DETECTED Final   Staphylococcus aureus (BCID) NOT DETECTED NOT DETECTED Final   Staphylococcus epidermidis NOT DETECTED NOT DETECTED Final   Staphylococcus lugdunensis NOT DETECTED NOT DETECTED Final   Streptococcus species NOT DETECTED NOT DETECTED Final   Streptococcus agalactiae NOT DETECTED NOT DETECTED Final   Streptococcus pneumoniae NOT  DETECTED NOT DETECTED Final   Streptococcus pyogenes NOT DETECTED NOT DETECTED Final   A.calcoaceticus-baumannii NOT DETECTED NOT DETECTED Final   Bacteroides fragilis NOT DETECTED NOT DETECTED Final   Enterobacterales PENDING NOT DETECTED Incomplete   Enterobacter cloacae complex NOT DETECTED NOT DETECTED Final   Escherichia coli DETECTED (A) NOT DETECTED Final    Comment: CRITICAL RESULT CALLED TO, READ BACK BY AND VERIFIED WITH: PHARMD K PIERCE [id number] AT 1156 AM BY CM    Klebsiella aerogenes NOT DETECTED NOT DETECTED Final   Klebsiella oxytoca NOT DETECTED NOT DETECTED Final   Klebsiella pneumoniae NOT DETECTED NOT DETECTED Final   Proteus species NOT DETECTED NOT DETECTED Final   Salmonella species NOT DETECTED NOT DETECTED Final   Serratia marcescens NOT DETECTED NOT DETECTED Final   Haemophilus influenzae NOT DETECTED NOT DETECTED Final   Neisseria meningitidis NOT DETECTED NOT DETECTED Final   Pseudomonas aeruginosa NOT DETECTED NOT DETECTED Final   Stenotrophomonas maltophilia NOT DETECTED NOT DETECTED Final   Candida albicans NOT DETECTED NOT DETECTED Final   Candida auris NOT DETECTED NOT DETECTED Final   Candida glabrata NOT DETECTED NOT DETECTED Final   Candida krusei NOT DETECTED NOT DETECTED Final   Candida parapsilosis NOT DETECTED NOT DETECTED Final   Candida tropicalis NOT DETECTED NOT DETECTED Final   Cryptococcus neoformans/gattii NOT DETECTED NOT DETECTED Final   CTX-M ESBL DETECTED (A) NOT DETECTED Final    Comment: CRITICAL RESULT CALLED TO, READ BACK BY AND VERIFIED WITH: PHARMD K PIERCE [id number] AT 1156 AM BY CM (NOTE) Extended spectrum beta-lactamase detected. Recommend a carbapenem as initial therapy.      Carbapenem resistance IMP NOT DETECTED NOT DETECTED Final   Carbapenem resistance KPC NOT DETECTED NOT DETECTED Final   Carbapenem resistance NDM NOT DETECTED NOT DETECTED Final   Carbapenem resist OXA 48 LIKE NOT DETECTED NOT DETECTED Final    Carbapenem resistance VIM NOT DETECTED NOT DETECTED Final    Comment: Performed at [facility], [place] [address], [place], [place] [address]  MRSA Next Gen by PCR, Nasal     Status: None   Collection Time: 04/30/22 10:10 PM   Specimen: Nasal Mucosa; Nasal Swab  Result Value Ref Range Status   MRSA by PCR Next Gen NOT DETECTED NOT DETECTED Final    Comment: (NOTE) The GeneXpert MRSA Assay (FDA approved for NASAL specimens only), is one component of a comprehensive MRSA colonization surveillance program. It is not intended to diagnose MRSA infection nor to guide or monitor treatment for MRSA infections. Test performance is not FDA approved in patients less than 25 years old. Performed at [facility], [place] [address] [place], [place] [address]      Labs: BNP (last 3 results) Recent Labs    03/23/22 2203 03/29/22 0334 04/30/22 2146  BNP  194.4* 126.7* 412.8*   Basic Metabolic Panel: Recent Labs  Lab 04/30/22 2125 04/30/22 2244 05/01/22 0211 05/02/22 0311 05/03/22 0322 05/04/22 0109  NA 139 139 141 140  --  136  K 2.9* 2.8* 3.1* 4.2  --  3.6  CL 105  --  117* 115*  --  104  CO2 22  --  19* 20*  --  26  GLUCOSE 101*  --  99 98  --  81  BUN 10  --  9 14  --  6*  CREATININE 0.82  --  0.67 0.68  --  0.63  CALCIUM 8.3*  --  6.4* 7.9*  --  8.1*  MG  --   --   --  1.3* 1.9  --    Liver Function Tests: Recent Labs  Lab 04/30/22 2125 05/01/22 0211  AST 23 20  ALT 13 10  ALKPHOS 54 37*  BILITOT 0.8 0.7  PROT 5.5* 3.9*  ALBUMIN 2.8* 2.0*   Recent Labs  Lab 04/30/22 2125  LIPASE 27   No results for input(s): "AMMONIA" in the last 168 hours. CBC: Recent Labs  Lab 04/30/22 2125 04/30/22 2244 05/01/22 0211 05/02/22 0311 05/03/22 0322  WBC 20.1*  --  25.2* 21.1* 12.2*  NEUTROABS 17.5*  --   --   --   --   HGB 13.9 12.6 12.1 10.5* 11.3*  HCT 42.5 37.0 37.2 33.1* 33.9*  MCV 96.2  --  97.4 97.4 92.9  PLT 231  --  205 198 208   Cardiac Enzymes: No  results for input(s): "CKTOTAL", "CKMB", "CKMBINDEX", "TROPONINI" in the last 168 hours. BNP: Invalid input(s): "POCBNP" CBG: No results for input(s): "GLUCAP" in the last 168 hours. D-Dimer No results for input(s): "DDIMER" in the last 72 hours. Hgb A1c No results for input(s): "HGBA1C" in the last 72 hours. Lipid Profile No results for input(s): "CHOL", "HDL", "LDLCALC", "TRIG", "CHOLHDL", "LDLDIRECT" in the last 72 hours. Thyroid function studies No results for input(s): "TSH", "T4TOTAL", "T3FREE", "THYROIDAB" in the last 72 hours.  Invalid input(s): "FREET3" Anemia work up No results for input(s): "VITAMINB12", "FOLATE", "FERRITIN", "TIBC", "IRON", "RETICCTPCT" in the last 72 hours. Urinalysis    Component Value Date/Time   COLORURINE AMBER (A) 10/25/2021 1737   APPEARANCEUR TURBID (A) 10/25/2021 1737   LABSPEC 1.021 10/25/2021 1737   PHURINE 7.0 10/25/2021 1737   GLUCOSEU NEGATIVE 10/25/2021 1737   HGBUR SMALL (A) 10/25/2021 1737   BILIRUBINUR NEGATIVE 10/25/2021 1737   KETONESUR NEGATIVE 10/25/2021 1737   PROTEINUR 100 (A) 10/25/2021 1737   UROBILINOGEN 0.2 04/20/2012 1605   NITRITE NEGATIVE 10/25/2021 1737   LEUKOCYTESUR LARGE (A) 10/25/2021 1737   Sepsis Labs Recent Labs  Lab 04/30/22 2125 05/01/22 0211 05/02/22 0311 05/03/22 0322  WBC 20.1* 25.2* 21.1* 12.2*   Microbiology Recent Results (from the past 240 hour(s))  SARS Coronavirus 2 by RT PCR (hospital order, performed in [facility] hospital lab) *cepheid single result test*     Status: None   Collection Time: 04/30/22  8:48 PM   Specimen: Nasal Swab  Result Value Ref Range Status   SARS Coronavirus 2 by RT PCR NEGATIVE NEGATIVE Final    Comment: (NOTE) SARS-CoV-2 target nucleic acids are NOT DETECTED.  The SARS-CoV-2 RNA is generally detectable in upper and lower respiratory specimens during the acute phase of infection. The lowest concentration of SARS-CoV-2 viral copies this assay can detect is  250 copies / mL. A negative result does not preclude SARS-CoV-2  infection and should not be used as the sole basis for treatment or other patient management decisions.  A negative result may occur with improper specimen collection / handling, submission of specimen other than nasopharyngeal swab, presence of viral mutation(s) within the areas targeted by this assay, and inadequate number of viral copies (<250 copies / mL). A negative result must be combined with clinical observations, patient history, and epidemiological information.  Fact Sheet for Patients:   https://www.patel.info/  Fact Sheet for Healthcare Providers: https://hall.com/  This test is not yet approved or  cleared by the [place] FDA and has been authorized for detection and/or diagnosis of SARS-CoV-2 by FDA under an Emergency Use Authorization (EUA).  This EUA will remain in effect (meaning this test can be used) for the duration of the COVID-19 declaration under Section 564(b)(1) of the Act, 21 U.S.C. section 360bbb-3(b)(1), unless the authorization is terminated or revoked sooner.  Performed at [facility], [place] [address] [place], [place] [address]   Culture, blood (Routine X 2) w Reflex to ID Panel     Status: Abnormal   Collection Time: 04/30/22  9:15 PM   Specimen: BLOOD  Result Value Ref Range Status   Specimen Description BLOOD BLOOD RIGHT FOREARM  Final   Special Requests   Final    BOTTLES DRAWN AEROBIC AND ANAEROBIC Blood Culture adequate volume   Culture  Setup Time   Final    GRAM NEGATIVE RODS IN BOTH AEROBIC AND ANAEROBIC BOTTLES CRITICAL VALUE NOTED.  VALUE IS CONSISTENT WITH PREVIOUSLY REPORTED AND CALLED VALUE.    Culture (A)  Final    ESCHERICHIA COLI SUSCEPTIBILITIES PERFORMED ON PREVIOUS CULTURE WITHIN THE LAST 5 DAYS. Performed at [facility], [place] [address] [place], [place] [address]    Report Status 05/03/2022 FINAL   Final  Culture, blood (Routine X 2) w Reflex to ID Panel     Status: Abnormal   Collection Time: 04/30/22  9:30 PM   Specimen: BLOOD  Result Value Ref Range Status   Specimen Description BLOOD BLOOD LEFT FOREARM  Final   Special Requests   Final    BOTTLES DRAWN AEROBIC AND ANAEROBIC Blood Culture adequate volume   Culture  Setup Time   Final    GRAM NEGATIVE RODS IN BOTH AEROBIC AND ANAEROBIC BOTTLES CRITICAL RESULT CALLED TO, READ BACK BY AND VERIFIED WITH: PHARMD K PIERCE [id number] AT 1031 AM BY CM Performed at [facility], [place] [address] [place], [place] [address]    Culture (A)  Final    ESCHERICHIA COLI Confirmed Extended Spectrum Beta-Lactamase Producer (ESBL).  In bloodstream infections from ESBL organisms, carbapenems are preferred over piperacillin/tazobactam. They are shown to have a lower risk of mortality.    Report Status 05/03/2022 FINAL  Final   Organism ID, Bacteria ESCHERICHIA COLI  Final      Susceptibility   Escherichia coli - MIC*    AMPICILLIN >=32 RESISTANT Resistant     CEFAZOLIN >=64 RESISTANT Resistant     CEFEPIME >=32 RESISTANT Resistant     CEFTAZIDIME >=64 RESISTANT Resistant     CEFTRIAXONE >=64 RESISTANT Resistant     CIPROFLOXACIN >=4 RESISTANT Resistant     GENTAMICIN <=1 SENSITIVE Sensitive     IMIPENEM <=0.25 SENSITIVE Sensitive     TRIMETH/SULFA >=320 RESISTANT Resistant     AMPICILLIN/SULBACTAM >=32 RESISTANT Resistant     PIP/TAZO 16 SENSITIVE Sensitive     * ESCHERICHIA COLI  Blood Culture ID  Panel (Reflexed)     Status: Abnormal (Preliminary result)   Collection Time: 04/30/22  9:30 PM  Result Value Ref Range Status   Enterococcus faecalis NOT DETECTED NOT DETECTED Final   Enterococcus Faecium NOT DETECTED NOT DETECTED Final   Listeria monocytogenes NOT DETECTED NOT DETECTED Final   Staphylococcus species NOT DETECTED NOT DETECTED Final   Staphylococcus aureus (BCID) NOT DETECTED NOT DETECTED Final   Staphylococcus epidermidis  NOT DETECTED NOT DETECTED Final   Staphylococcus lugdunensis NOT DETECTED NOT DETECTED Final   Streptococcus species NOT DETECTED NOT DETECTED Final   Streptococcus agalactiae NOT DETECTED NOT DETECTED Final   Streptococcus pneumoniae NOT DETECTED NOT DETECTED Final   Streptococcus pyogenes NOT DETECTED NOT DETECTED Final   A.calcoaceticus-baumannii NOT DETECTED NOT DETECTED Final   Bacteroides fragilis NOT DETECTED NOT DETECTED Final   Enterobacterales PENDING NOT DETECTED Incomplete   Enterobacter cloacae complex NOT DETECTED NOT DETECTED Final   Escherichia coli DETECTED (A) NOT DETECTED Final    Comment: CRITICAL RESULT CALLED TO, READ BACK BY AND VERIFIED WITH: PHARMD K PIERCE [id number] AT 1156 AM BY CM    Klebsiella aerogenes NOT DETECTED NOT DETECTED Final   Klebsiella oxytoca NOT DETECTED NOT DETECTED Final   Klebsiella pneumoniae NOT DETECTED NOT DETECTED Final   Proteus species NOT DETECTED NOT DETECTED Final   Salmonella species NOT DETECTED NOT DETECTED Final   Serratia marcescens NOT DETECTED NOT DETECTED Final   Haemophilus influenzae NOT DETECTED NOT DETECTED Final   Neisseria meningitidis NOT DETECTED NOT DETECTED Final   Pseudomonas aeruginosa NOT DETECTED NOT DETECTED Final   Stenotrophomonas maltophilia NOT DETECTED NOT DETECTED Final   Candida albicans NOT DETECTED NOT DETECTED Final   Candida auris NOT DETECTED NOT DETECTED Final   Candida glabrata NOT DETECTED NOT DETECTED Final   Candida krusei NOT DETECTED NOT DETECTED Final   Candida parapsilosis NOT DETECTED NOT DETECTED Final   Candida tropicalis NOT DETECTED NOT DETECTED Final   Cryptococcus neoformans/gattii NOT DETECTED NOT DETECTED Final   CTX-M ESBL DETECTED (A) NOT DETECTED Final    Comment: CRITICAL RESULT CALLED TO, READ BACK BY AND VERIFIED WITH: PHARMD K PIERCE [id number] AT 1156 AM BY CM (NOTE) Extended spectrum beta-lactamase detected. Recommend a carbapenem as initial therapy.      Carbapenem  resistance IMP NOT DETECTED NOT DETECTED Final   Carbapenem resistance KPC NOT DETECTED NOT DETECTED Final   Carbapenem resistance NDM NOT DETECTED NOT DETECTED Final   Carbapenem resist OXA 48 LIKE NOT DETECTED NOT DETECTED Final   Carbapenem resistance VIM NOT DETECTED NOT DETECTED Final    Comment: Performed at [facility], [place] [address] [place], [place] [address]  MRSA Next Gen by PCR, Nasal     Status: None   Collection Time: 04/30/22 10:10 PM   Specimen: Nasal Mucosa; Nasal Swab  Result Value Ref Range Status   MRSA by PCR Next Gen NOT DETECTED NOT DETECTED Final    Comment: (NOTE) The GeneXpert MRSA Assay (FDA approved for NASAL specimens only), is one component of a comprehensive MRSA colonization surveillance program. It is not intended to diagnose MRSA infection nor to guide or monitor treatment for MRSA infections. Test performance is not FDA approved in patients less than 61 years old. Performed at [facility], [place] [address] [place], [place] [address]     Please note: You were cared for by a hospitalist during your hospital stay. Once you are discharged, your primary care physician will  handle any further medical issues. Please note that NO REFILLS for any discharge medications will be authorized once you are discharged, as it is imperative that you return to your primary care physician (or establish a relationship with a primary care physician if you do not have one) for your post hospital discharge needs so that they can reassess your need for medications and monitor your lab values.    Time coordinating discharge: 40 minutes  SIGNED:   Shelly Coss, MD  [facility] 05/04/2022, 10:40 AM Pager [phone]  If 7PM-7AM, please contact night-coverage [email or web address] Password [email or web address]

## 2022-05-20 ENCOUNTER — Other Ambulatory Visit: Payer: Self-pay

## 2022-05-20 ENCOUNTER — Ambulatory Visit (INDEPENDENT_AMBULATORY_CARE_PROVIDER_SITE_OTHER): Payer: Medicare (Managed Care) | Admitting: Infectious Diseases

## 2022-05-20 VITALS — BP 91/63 | HR 66

## 2022-05-20 DIAGNOSIS — R7881 Bacteremia: Secondary | ICD-10-CM | POA: Diagnosis not present

## 2022-05-20 DIAGNOSIS — Z452 Encounter for adjustment and management of vascular access device: Secondary | ICD-10-CM | POA: Diagnosis not present

## 2022-05-20 DIAGNOSIS — A499 Bacterial infection, unspecified: Secondary | ICD-10-CM | POA: Diagnosis not present

## 2022-05-20 DIAGNOSIS — Z1612 Extended spectrum beta lactamase (ESBL) resistance: Secondary | ICD-10-CM | POA: Insufficient documentation

## 2022-05-20 DIAGNOSIS — Z79899 Other long term (current) drug therapy: Secondary | ICD-10-CM | POA: Diagnosis not present

## 2022-05-20 NOTE — Progress Notes (Addendum)
Patient Active Problem List   Diagnosis Date Noted   Bacteremia    Severe sepsis ([place]) 04/30/2022   Pneumonia due to COVID-19 virus 03/24/2022   Chronic diastolic CHF (congestive heart failure) (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 2 entries on MAR: 650 mg 3 times daily 650 mg every 4 hours as needed for pain   AMIODARONE (PACERONE) 200 MG TABLET    Take 1 tablet (200 mg total) by mouth daily.   BISACODYL (DULCOLAX) 10 MG SUPPOSITORY    Place 1 suppository (10 mg total) rectally daily as needed for moderate constipation.   BRIMONIDINE (ALPHAGAN P) 0.1 % SOLN    Place 1 drop into the left eye in the morning, at noon, and at bedtime.   BUDESONIDE-FORMOTEROL (SYMBICORT) 160-4.5 MCG/ACT INHALER    Inhale 2 puffs into the lungs 2 (two) times daily.   CHOLECALCIFEROL (VITAMIN D) 50 MCG (2000 UT) CAPS    Take 2,000 Units by mouth daily.   CRANBERRY 425 MG CAPS    Take 425 mg by mouth daily as needed  (bladder irritation).   CYANOCOBALAMIN 1000 MCG TABLET    Take 1,000 mcg by mouth daily.   ENSURE (ENSURE)    Take 237 mLs by mouth 2 (two) times daily.   ERTAPENEM (INVANZ) IVPB    Inject 1 g into the vein daily. Indication:  ESBL E. Coli BSI  First Dose: No Last Day of Therapy:  05/15/22 Labs - Once weekly:  CBC/D and BMP, Labs - Every other week:  ESR and CRP Method of administration: Mini-Bag Plus / Gravity Method of administration may be changed at the discretion of home infusion pharmacist based upon assessment of the patient and/or caregiver's ability to self-administer the medication ordered.   EYELID CLEANSERS (OCUSOFT LID SCRUB EX)    Place 1 Pad into both eyes daily.   FERROUS SULFATE 325 (65 FE) MG TABLET    Take 325 mg by mouth daily.   FUROSEMIDE (LASIX) 40 MG TABLET    Take 1 tablet (40 mg total) by mouth daily.   GUAIFENESIN (MUCINEX) 600 MG 12 HR TABLET    Take 2 tablets (1,200 mg total) by mouth 2 (two) times daily.   HYDROCORTISONE CREAM 1 %    Apply 1 Application topically 2 (two) times daily. To bilateral hands   LATANOPROST (XALATAN) 0.005 % OPHTHALMIC SOLUTION    PLACE 1 DROP IN EACH EYE AT BEDTIME   LEVALBUTEROL (XOPENEX HFA) 45 MCG/ACT INHALER    Inhale 2 puffs into  the lungs 3 (three) times daily.   LEVALBUTEROL (XOPENEX) 0.63 MG/3ML NEBULIZER SOLUTION    Take 3 mLs (0.63 mg total) by nebulization every 6 (six) hours as needed for wheezing or shortness of breath.   LEVOTHYROXINE (SYNTHROID, LEVOTHROID) 75 MCG TABLET    Take 75 mcg by mouth daily.   LORAZEPAM (ATIVAN) 0.5 MG TABLET    Take 1 tablet (0.5 mg total) by mouth at bedtime. May also give 1 tablet by mouth twice a day as needed for anxiety   MAGNESIUM HYDROXIDE (MILK OF MAGNESIA) 400 MG/5ML SUSPENSION    Take 30 mLs by mouth daily as needed (constipation).   MENTHOL, TOPICAL ANALGESIC, (BIOFREEZE ROLL-ON EX)    Apply 1 application  topically every 4 (four) hours as needed (joint pain).   MENTHOL-CETYLPYRIDINIUM  (CEPACOL) 3 MG LOZENGE    Take 1 lozenge (3 mg total) by mouth as needed for sore throat.   OLOPATADINE HCL 0.2 % SOLN    Place 1 drop into both eyes daily.   ONDANSETRON (ZOFRAN) 4 MG TABLET    Take 1 tablet (4 mg total) by mouth daily as needed for nausea or vomiting.   PANTOPRAZOLE (PROTONIX) 40 MG TABLET    Take 1 tablet (40 mg total) by mouth 2 (two) times daily.   POLYETHYL GLYCOL-PROPYL GLYCOL (GENTEAL TEARS SEVERE DAY/NIGHT) 0.4-0.3 % GEL OPHTHALMIC GEL    Place 1 Application into both eyes at bedtime.   POLYETHYLENE GLYCOL (MIRALAX / GLYCOLAX) 17 G PACKET    Take 17 g by mouth daily.   PREDNISONE (DELTASONE) 5 MG TABLET    Take 1 tablet (5 mg total) by mouth daily.   RIVAROXABAN (XARELTO) 20 MG TABS TABLET    Take 20 mg by mouth at bedtime.   SACCHAROMYCES BOULARDII (PROBIOTIC) 250 MG CAPS    Take 250 mg by mouth daily.   SENNA-DOCUSATE (SENOKOT-S) 8.6-50 MG TABLET    Take 2 tablets by mouth at bedtime.   SODIUM FLUORIDE (PREVIDENT 5000 PLUS) 1.1 % CREA DENTAL CREAM    Place 1 Application onto teeth at bedtime.   SODIUM PHOSPHATES (FLEET ENEMA RE)    Place 1 enema rectally daily as needed (constipation not relieved by Bisacodyl suppository).   TRAMADOL (ULTRAM) 50 MG TABLET    Take 0.5 tablets (25 mg total) by mouth 2 (two) times daily as needed for moderate pain (back pain).   TRIAMCINOLONE CREAM (KENALOG) 0.1 %    Apply 1 Application topically 2 (two) times daily. To rash on lower back.  Modified Medications   No medications on file  Discontinued Medications   No medications on file    Subjective: Here for HFU for ESBL e coli bacteremia of unclear cause. She has completed 14 days of appropriate abtx on 05/14/22. Denies any fevers, chills. Denies any nausea, vomiting and diarrhea. She came in from a facility without any accompanying person or papers. She still has a midline in the left arm. Denies any pain, tenderness and swelling in the midline site. She has a nasal cannula and  breathing comfortably. She has baseline dementia. Denies any complaints.   Review of Systems: all systems reviewed with pertinent positives and negatives as listed above  Past Medical History:  Diagnosis Date   Anxiety    Asthma    Atrial fibrillation (HCC)    CVA (cerebral infarction)    a. 09/2011 MRI tiny bilat centrum semiovale acute non hemorrhagic infarcts   Dementia (HCC)    mild per patient  Esophageal stricture    Gastric polyp    Hyperplastic   Gastritis    GERD (gastroesophageal reflux disease)    Glaucoma    Hiatal hernia    Hyperlipidemia    Hypertension    Hypothyroidism    Osteoarthritis    Pneumonia    Rheumatoid arthritis(714.0)    Thyroid disease    Past Surgical History:  Procedure Laterality Date   APPENDECTOMY     BIOPSY  10/22/2021   Procedure: BIOPSY;  Surgeon: Daneil Dolin, MD;  [facility]: [facility];  Service: Endoscopy;;  gastric biopsy for H. pylori   BLEPHAROPLASTY Right    CATARACT EXTRACTION Bilateral    CHOLECYSTECTOMY     ESOPHAGOGASTRODUODENOSCOPY (EGD) WITH PROPOFOL N/A 10/22/2021   Procedure: ESOPHAGOGASTRODUODENOSCOPY (EGD) WITH PROPOFOL;  Surgeon: Daneil Dolin, MD;  Location: [facility];  Service: Endoscopy;  Laterality: N/A;   HEMOSTASIS CLIP PLACEMENT  10/22/2021   Procedure: HEMOSTASIS CLIP PLACEMENT;  Surgeon: Daneil Dolin, MD;  Location: [facility];  Service: Endoscopy;;   POLYPECTOMY  10/22/2021   Procedure: POLYPECTOMY;  Surgeon: Daneil Dolin, MD;  [facility]: [facility];  Service: Endoscopy;;  gastric    TUBAL LIGATION      Social History   Tobacco Use   Smoking status: Never   Smokeless tobacco: Never  Vaping Use   Vaping Use: Never used  Substance Use Topics   Alcohol use: No    Alcohol/week: 0.0 standard drinks of alcohol   Drug use: No    Family History  Problem Relation Age of Onset   Diabetes Mother    Cancer Father        lymph nodes???   Diabetes Sister    Heart disease Sister     Other Son        he was shot   Colon cancer Neg Hx    Esophageal cancer Neg Hx     Allergies  Allergen Reactions   Aciphex [Rabeprazole Sodium] Other (See Comments)    Unknown reaction Listed on MAR   Amoxil [Amoxicillin] Other (See Comments)    Unknown reaction Listed on MAR   Avelox [Moxifloxacin] Other (See Comments)    Unknown reaction Listed on MAR   Biaxin [Clarithromycin] Hives and Itching   Hydrocodone Other (See Comments)    Unknown reaction  Not listed on MAR   Ketek [Telithromycin] Other (See Comments)    Unknown reaction Not listed on MAR   Latex Other (See Comments)    Unknown reaction Listed on MAR   Levaquin [Levofloxacin] Other (See Comments)    Unknown reaction  Listed on MAR   Lipitor [Atorvastatin] Other (See Comments)    Unknown reaction Listed on MAR   Lyrica [Pregabalin] Other (See Comments)    Unknown reaction Listed on MAR   Macrobid [Nitrofurantoin Macrocrystal] Other (See Comments)    Unknown reaction  Listed on MAR   Nexium [Esomeprazole Magnesium] Other (See Comments)    Unknown reaction Listed on MAR   Sulfa Antibiotics Other (See Comments)    Unknown reaction  Listed on MAR   Sulfonamide Derivatives Other (See Comments)    Unknown reaction Listed on MAR   Vesicare [Solifenacin] Other (See Comments)    Unknown reaction Listed on MAR   Zithromax [Azithromycin] Other (See Comments)    Unknown reaction Listed on MAR   Keflex [Cephalexin] Itching and Rash    Health Maintenance  Topic Date Due   Medicare Annual Wellness (AWV)  Never done   COVID-19 Vaccine (1) Never done   Zoster Vaccines- Shingrix (1 of 2) Never done   Pneumonia Vaccine 55+ Years old (1 - PCV) Never done   TETANUS/TDAP  07/27/2015   MAMMOGRAM  12/09/2016   INFLUENZA VACCINE  02/23/2022   DEXA SCAN  Completed   HPV VACCINES  Aged Out    Objective: BP 91/63   Pulse 66   SpO2 100%    Physical Exam Constitutional:      Appearance: elderly white female  sitting in a wheel chair , appears chronically sick HENT:     Head: Normocephalic and atraumatic.      Mouth: Mucous membranes are moist.  Eyes:    Conjunctiva/sclera: Conjunctivae normal.     Pupils:   Cardiovascular:     Rate and Rhythm: Normal rate and regular rhythm.     Heart sounds:  Pulmonary:     Effort: Pulmonary effort is normal on nasal cannula     Breath sounds: Normal breath sounds.   Abdominal:     General: Non distended     Palpations: soft.   Skin:    General: Skin is warm and dry.     Comments:  Neurological:     General: grossly non focal     Mental Status: demented but follows commands appropriately   Psychiatric:        Mood and Affect: Mood normal.   Lab Results Lab Results  Component Value Date   WBC 12.2 (H) 05/03/2022   HGB 11.3 (L) 05/03/2022   HCT 33.9 (L) 05/03/2022   MCV 92.9 05/03/2022   PLT 208 05/03/2022    Lab Results  Component Value Date   CREATININE 0.63 05/04/2022   BUN 6 (L) 05/04/2022   NA 136 05/04/2022   K 3.6 05/04/2022   CL 104 05/04/2022   CO2 26 05/04/2022    Lab Results  Component Value Date   ALT 10 05/01/2022   AST 20 05/01/2022   ALKPHOS 37 (L) 05/01/2022   BILITOT 0.7 05/01/2022    Lab Results  Component Value Date   CHOL 175 10/16/2013   HDL 69 10/16/2013   LDLCALC 90 10/16/2013   TRIG 81 10/16/2013   CHOLHDL 1.8 09/30/2011   No results found for: "LABRPR", "RPRTITER" No results found for: "HIV1RNAQUANT", "HIV1RNAVL", "CD4TABS"  Assessment/Plan 81 Y O female with h/o asthma, GERD, HTN, A Fib, Glaucoma, Arthritis, CVA, Hypothyroidism, Depression, Dementia here for HFU for  # ESBL E coli bacteremia of unclear cause # RA - on prednisone  # Midline - no concerns  Completed 14 days of Iv meropenem/ertapenem as indicated Midline need to be removed. Informed to staff at the SNF by nursing.  Fu as needed  I have personally spent 45 minutes involved in face-to-face and non-face-to-face activities for  this patient on the day of the visit. Professional time spent includes the following activities: Preparing to see the patient (review of tests), Obtaining and/or reviewing separately obtained history (admission/discharge record), Performing a medically appropriate examination and/or evaluation , Ordering medications/tests/procedures, referring and communicating with other health care professionals, Documenting clinical information in the EMR, Independently interpreting results (not separately reported), Communicating results to the patient/family/caregiver, Counseling and educating the patient/family/caregiver and Care coordination (not separately reported).   Wilber Oliphant, [facility] 05/20/2022, 8:55 AM

## 2022-05-20 NOTE — Patient Instructions (Signed)
Fu as needed Midline needs to be removed.

## 2022-08-26 DEATH — deceased
